# Patient Record
Sex: Male | Born: 1960 | Race: Black or African American | Hispanic: No | Marital: Married | Smoking: Former smoker
Health system: Southern US, Community
[De-identification: ages and names within clinical notes are randomized; demographics above are authoritative.]

## PROBLEM LIST (undated history)

## (undated) DIAGNOSIS — R7303 Prediabetes: Secondary | ICD-10-CM

## (undated) DIAGNOSIS — T7840XA Allergy, unspecified, initial encounter: Secondary | ICD-10-CM

## (undated) DIAGNOSIS — M1990 Unspecified osteoarthritis, unspecified site: Secondary | ICD-10-CM

## (undated) DIAGNOSIS — K219 Gastro-esophageal reflux disease without esophagitis: Secondary | ICD-10-CM

## (undated) DIAGNOSIS — J189 Pneumonia, unspecified organism: Secondary | ICD-10-CM

## (undated) DIAGNOSIS — K635 Polyp of colon: Secondary | ICD-10-CM

## (undated) DIAGNOSIS — K602 Anal fissure, unspecified: Secondary | ICD-10-CM

## (undated) HISTORY — DX: Gastro-esophageal reflux disease without esophagitis: K21.9

## (undated) HISTORY — PX: COLONOSCOPY: SHX174

## (undated) HISTORY — DX: Anal fissure, unspecified: K60.2

## (undated) HISTORY — DX: Polyp of colon: K63.5

## (undated) HISTORY — DX: Unspecified osteoarthritis, unspecified site: M19.90

---

## 2004-07-27 ENCOUNTER — Emergency Department (HOSPITAL_COMMUNITY): Admission: EM | Admit: 2004-07-27 | Discharge: 2004-07-27 | Payer: Self-pay | Admitting: Emergency Medicine

## 2004-07-28 ENCOUNTER — Emergency Department (HOSPITAL_COMMUNITY): Admission: EM | Admit: 2004-07-28 | Discharge: 2004-07-28 | Payer: Self-pay | Admitting: Family Medicine

## 2004-07-30 ENCOUNTER — Emergency Department (HOSPITAL_COMMUNITY): Admission: EM | Admit: 2004-07-30 | Discharge: 2004-07-30 | Payer: Self-pay | Admitting: Family Medicine

## 2004-08-04 ENCOUNTER — Emergency Department (HOSPITAL_COMMUNITY): Admission: EM | Admit: 2004-08-04 | Discharge: 2004-08-04 | Payer: Self-pay | Admitting: Emergency Medicine

## 2004-11-29 ENCOUNTER — Ambulatory Visit (HOSPITAL_COMMUNITY): Admission: RE | Admit: 2004-11-29 | Discharge: 2004-11-29 | Payer: Self-pay | Admitting: Gastroenterology

## 2005-05-25 ENCOUNTER — Emergency Department (HOSPITAL_COMMUNITY): Admission: EM | Admit: 2005-05-25 | Discharge: 2005-05-25 | Payer: Self-pay | Admitting: Emergency Medicine

## 2006-03-12 ENCOUNTER — Ambulatory Visit: Payer: Self-pay | Admitting: Family Medicine

## 2006-04-16 ENCOUNTER — Ambulatory Visit: Payer: Self-pay | Admitting: Family Medicine

## 2006-04-16 LAB — CONVERTED CEMR LAB
ALT: 21 units/L (ref 0–40)
Alkaline Phosphatase: 58 units/L (ref 39–117)
Anti Nuclear Antibody(ANA): NEGATIVE
BUN: 18 mg/dL (ref 6–23)
Bilirubin, Direct: 0.1 mg/dL (ref 0.0–0.3)
CO2: 25 meq/L (ref 19–32)
Cholesterol: 157 mg/dL (ref 0–200)
Creatinine, Ser: 1 mg/dL (ref 0.4–1.5)
GFR calc Af Amer: 104 mL/min
Glucose, Bld: 98 mg/dL (ref 70–99)
HDL: 59.1 mg/dL (ref >39.0)
Potassium: 3.8 meq/L (ref 3.5–5.1)
Rhuematoid fact SerPl-aCnc: <20 intl units/mL — ABNORMAL LOW (ref 0.0–20.0)
Total Bilirubin: 0.5 mg/dL (ref 0.3–1.2)
Total CHOL/HDL Ratio: 2.7
Total Protein: 6.9 g/dL (ref 6.0–8.3)
Triglycerides: 57 mg/dL (ref 0–149)

## 2006-04-30 ENCOUNTER — Ambulatory Visit: Payer: Self-pay | Admitting: Family Medicine

## 2006-05-03 ENCOUNTER — Ambulatory Visit: Payer: Self-pay | Admitting: Internal Medicine

## 2006-10-31 ENCOUNTER — Telehealth: Payer: Self-pay | Admitting: Internal Medicine

## 2006-10-31 ENCOUNTER — Ambulatory Visit: Payer: Self-pay | Admitting: Internal Medicine

## 2006-10-31 ENCOUNTER — Ambulatory Visit: Payer: Self-pay | Admitting: Cardiology

## 2006-10-31 DIAGNOSIS — E785 Hyperlipidemia, unspecified: Secondary | ICD-10-CM | POA: Insufficient documentation

## 2006-10-31 DIAGNOSIS — K219 Gastro-esophageal reflux disease without esophagitis: Secondary | ICD-10-CM | POA: Insufficient documentation

## 2006-11-01 LAB — CONVERTED CEMR LAB
BUN: 13 mg/dL (ref 6–23)
Basophils Absolute: 0 10*3/uL (ref 0.0–0.1)
Calcium: 9.2 mg/dL (ref 8.4–10.5)
GFR calc Af Amer: 93 mL/min
GFR calc non Af Amer: 77 mL/min
Hemoglobin: 14.2 g/dL (ref 13.0–17.0)
Lymphocytes Relative: 41.3 % (ref 12.0–46.0)
MCHC: 34.2 g/dL (ref 30.0–36.0)
Monocytes Absolute: 0.5 10*3/uL (ref 0.2–0.7)
Monocytes Relative: 7.9 % (ref 3.0–11.0)
Neutro Abs: 3.1 10*3/uL (ref 1.4–7.7)
Platelets: 211 10*3/uL (ref 150–400)
Potassium: 4.1 meq/L (ref 3.5–5.1)

## 2006-11-05 ENCOUNTER — Ambulatory Visit: Payer: Self-pay | Admitting: Internal Medicine

## 2006-11-05 DIAGNOSIS — L738 Other specified follicular disorders: Secondary | ICD-10-CM | POA: Insufficient documentation

## 2006-11-05 DIAGNOSIS — L678 Other hair color and hair shaft abnormalities: Secondary | ICD-10-CM | POA: Insufficient documentation

## 2008-01-10 ENCOUNTER — Emergency Department (HOSPITAL_COMMUNITY): Admission: EM | Admit: 2008-01-10 | Discharge: 2008-01-10 | Payer: Self-pay | Admitting: Emergency Medicine

## 2008-01-12 ENCOUNTER — Emergency Department (HOSPITAL_COMMUNITY): Admission: EM | Admit: 2008-01-12 | Discharge: 2008-01-12 | Payer: Self-pay | Admitting: Family Medicine

## 2008-01-15 ENCOUNTER — Encounter (HOSPITAL_COMMUNITY): Admission: RE | Admit: 2008-01-15 | Discharge: 2008-04-14 | Payer: Self-pay | Admitting: *Deleted

## 2008-01-19 ENCOUNTER — Emergency Department (HOSPITAL_COMMUNITY): Admission: EM | Admit: 2008-01-19 | Discharge: 2008-01-19 | Payer: Self-pay | Admitting: Family Medicine

## 2009-08-17 ENCOUNTER — Emergency Department (HOSPITAL_COMMUNITY): Admission: EM | Admit: 2009-08-17 | Discharge: 2009-08-17 | Payer: Self-pay | Admitting: Emergency Medicine

## 2009-11-08 ENCOUNTER — Ambulatory Visit (HOSPITAL_COMMUNITY): Admission: RE | Admit: 2009-11-08 | Discharge: 2009-11-08 | Payer: Self-pay | Admitting: Internal Medicine

## 2010-03-18 LAB — URINALYSIS, ROUTINE W REFLEX MICROSCOPIC
Nitrite: NEGATIVE
Protein, ur: NEGATIVE mg/dL
Specific Gravity, Urine: 1.028 (ref 1.005–1.030)
Urobilinogen, UA: 1 mg/dL (ref 0.0–1.0)

## 2010-08-22 ENCOUNTER — Ambulatory Visit (HOSPITAL_COMMUNITY)
Admission: RE | Admit: 2010-08-22 | Discharge: 2010-08-22 | Disposition: A | Discharge status: Home / Self Care | Payer: Managed Care, Other (non HMO) | Source: Ambulatory Visit | Attending: Internal Medicine | Admitting: Internal Medicine

## 2010-08-22 ENCOUNTER — Other Ambulatory Visit (HOSPITAL_COMMUNITY): Payer: Self-pay | Admitting: Internal Medicine

## 2010-08-22 DIAGNOSIS — M79609 Pain in unspecified limb: Secondary | ICD-10-CM

## 2010-08-22 DIAGNOSIS — M7989 Other specified soft tissue disorders: Secondary | ICD-10-CM | POA: Insufficient documentation

## 2010-12-12 ENCOUNTER — Other Ambulatory Visit: Payer: Managed Care, Other (non HMO) | Admitting: Gastroenterology

## 2011-01-13 DIAGNOSIS — K635 Polyp of colon: Secondary | ICD-10-CM

## 2011-01-13 HISTORY — DX: Polyp of colon: K63.5

## 2011-01-18 ENCOUNTER — Encounter (INDEPENDENT_AMBULATORY_CARE_PROVIDER_SITE_OTHER): Payer: Self-pay | Admitting: General Surgery

## 2011-01-23 ENCOUNTER — Encounter (INDEPENDENT_AMBULATORY_CARE_PROVIDER_SITE_OTHER): Payer: Self-pay | Admitting: General Surgery

## 2011-01-23 ENCOUNTER — Ambulatory Visit (INDEPENDENT_AMBULATORY_CARE_PROVIDER_SITE_OTHER): Payer: Managed Care, Other (non HMO) | Admitting: General Surgery

## 2011-01-23 VITALS — BP 124/88 | HR 84 | Temp 97.0°F | Resp 12 | Ht 66.0 in | Wt 168.8 lb

## 2011-01-23 DIAGNOSIS — K602 Anal fissure, unspecified: Secondary | ICD-10-CM

## 2011-01-23 NOTE — Progress Notes (Signed)
Patient ID: Brett [name], male   DOB: [birth year], 51 y.o.   MRN: [id number]  Chief Complaint  Patient presents with  . Other    new pt- eval anal fissure    HPI Brett [name] is a 51 y.o. male.  He is referred by Dr. Charlott Rakes for evaluation and management of an anal fissure which is now becoming chronic.  The patient states that he has a 6 month history of rectal pain but no bleeding. He feels burning in the rectal area. This occurred during and more intensely after bowel movements. This happens every 2 or 3 days. He doesn't feel any mass on the outside or prolapse. He has had 2 two-week courses of diltiazem cream without relief.  A colonoscopy  in January of this year which shows a few small polyps and maybe some tiny hemorrhoids but nothing else.  He thinks that  the problem started when he started taking hydrocodone 6 months ago because of arthritis in his hands. He stopped taking that and now takes daily Colace and his bowels are a little bit easier. HPI  Past Medical History  Diagnosis Date  . Arthritis   . GERD (gastroesophageal reflux disease)   . Diabetes mellitus   . Anal fissure     Past Surgical History  Procedure Date  . Colonoscopy     History reviewed. No pertinent family history.  Social History History  Substance Use Topics  . Smoking status: Current Everyday Smoker -- 0.2 packs/day  . Smokeless tobacco: Not on file  . Alcohol Use: Yes    Allergies  Allergen Reactions  . Dicyclomine Hives    Current Outpatient Prescriptions  Medication Sig Dispense Refill  . acetaminophen (TYLENOL) 500 MG tablet Take 500 mg by mouth every 6 (six) hours as needed.        Review of Systems Review of Systems  Constitutional: Negative for fever, chills and unexpected weight change.  HENT: Negative for hearing loss, congestion, sore throat, trouble swallowing and voice change.   Eyes: Negative for visual disturbance.  Respiratory: Positive for stridor.  Negative for cough and wheezing.   Cardiovascular: Negative for chest pain, palpitations and leg swelling.  Gastrointestinal: Positive for constipation and rectal pain. Negative for nausea, vomiting, abdominal pain, diarrhea, blood in stool, abdominal distention and anal bleeding.  Genitourinary: Negative for hematuria and difficulty urinating.  Musculoskeletal: Negative for arthralgias.  Skin: Negative for rash and wound.  Neurological: Negative for seizures, syncope, weakness and headaches.  Hematological: Negative for adenopathy. Does not bruise/bleed easily.  Psychiatric/Behavioral: Negative for confusion.    Blood pressure 124/88, pulse 84, temperature 97 F (36.1 C), temperature source Temporal, resp. rate 12, height 5\' 6"  (1.676 m), weight 168 lb 12.8 oz (76.567 kg).  Physical Exam Physical Exam  Constitutional: He is oriented to person, place, and time. He appears well-developed and well-nourished. No distress.  HENT:  Head: Normocephalic.  Nose: Nose normal.  Mouth/Throat: No oropharyngeal exudate.  Eyes: Conjunctivae and EOM are normal. Pupils are equal, round, and reactive to light. Right eye exhibits no discharge. Left eye exhibits no discharge. No scleral icterus.  Neck: Normal range of motion. Neck supple. No JVD present. No tracheal deviation present. No thyromegaly present.  Cardiovascular: Normal rate, regular rhythm, normal heart sounds and intact distal pulses.   No murmur heard. Pulmonary/Chest: Effort normal and breath sounds normal. No stridor. No respiratory distress. He has no wheezes. He has no rales. He exhibits no tenderness.  Abdominal: Soft. Bowel  sounds are normal. He exhibits no distension and no mass. There is no tenderness. There is no rebound and no guarding.  Genitourinary:       Anal fissure visuallized in posterior midline. Small sentinal pile .  No other external abnormalities. Too tender for anoscopy.  Musculoskeletal: Normal range of motion. He  exhibits no edema and no tenderness.  Lymphadenopathy:    He has no cervical adenopathy.  Neurological: He is alert and oriented to person, place, and time. He has normal reflexes. Coordination normal.  Skin: Skin is warm and dry. No rash noted. He is not diaphoretic. No erythema. No pallor.  Psychiatric: He has a normal mood and affect. His behavior is normal. Judgment and thought content normal.    Data Reviewed I reviewed all of Dr. Marge Duncans office notes. Colonoscopy report is pending.  Assessment    Chronic anal fissure, posterior midline. Due to the failure of medical therapy I think he would benefit from surgical intervention.  Osteoarthritis  Gastroesophageal reflux disease, controlled    Plan    We'll schedule for examination under anesthesia, lateral internal sphincterotomy and repair of anal fissure.  I have discussed indications and details of the surgery with the patient and his wife. Risks and complications have been outlined, including but limited to bleeding, infection, recurrence of the fissure, sphincter damage with temporary or permanent incontinence, cardiac pulmonary and thromboembolic problems. We discussed temporary disability issues. He understands these issues. His questions were answered. He agrees with this plan.   Angelia Mould. Derrell Lolling, M.D., [facility] General and Minimally invasive Surgery Breast and Colorectal Surgery Office:   [phone] Pager:   [phone]     01/23/2011, 10:21 AM

## 2011-01-23 NOTE — Patient Instructions (Signed)
You have an anal fissure in the posterior midline. You will be scheduled for surgery to repair the anal fissure and perform an internal sphincterotomy.  Anal Fissure, Adult An anal fissure is a small tear or crack in the skin around the anus. Bleeding from a fissure usually stops on its own within a few minutes. However, bleeding will often reoccur with each bowel movement until the crack heals.  CAUSES   Passing large, hard stools.   Frequent diarrheal stools.   Constipation.   Inflammatory bowel disease (Crohn's disease or ulcerative colitis).   Infections.   Anal sex.  SYMPTOMS   Small amounts of blood seen on your stools, on toilet paper, or in the toilet after a bowel movement.   Rectal bleeding.   Painful bowel movements.   Itching or irritation around the anus.  DIAGNOSIS Your caregiver will examine the anal area. An anal fissure can usually be seen with careful inspection. A rectal exam may be performed and a short tube (anoscope) may be used to examine the anal canal. TREATMENT   You may be instructed to take fiber supplements. These supplements can soften your stool to help make bowel movements easier.   Sitz baths may be recommended to help heal the tear. Do not use soap in the sitz baths.   A medicated cream or ointment may be prescribed to lessen discomfort.  HOME CARE INSTRUCTIONS   Maintain a diet high in fruits, whole grains, and vegetables. Avoid constipating foods like bananas and dairy products.   Take sitz baths as directed by your caregiver.   Drink enough fluids to keep your urine clear or pale yellow.   Only take over-the-counter or prescription medicines for pain, discomfort, or fever as directed by your caregiver. Do not take aspirin as this may increase bleeding.   Do not use ointments containing numbing medications (anesthetics) or hydrocortisone. They could slow healing.  SEEK MEDICAL CARE IF:   Your fissure is not completely healed within  3 days.   You have further bleeding.   You have a fever.   You have diarrhea mixed with blood.   You have pain.   Your problem is getting worse rather than better.  MAKE SURE YOU:   Understand these instructions.   Will watch your condition.   Will get help right away if you are not doing well or get worse.  Document Released: 12/19/2004 Document Revised: 08/31/2010 Document Reviewed: 06/05/2010 [facility] Patient Information 2012 [place], [place].

## 2011-01-24 ENCOUNTER — Encounter (INDEPENDENT_AMBULATORY_CARE_PROVIDER_SITE_OTHER): Payer: Self-pay

## 2011-02-24 DIAGNOSIS — K602 Anal fissure, unspecified: Secondary | ICD-10-CM

## 2011-02-24 HISTORY — PX: ANAL FISSURE REPAIR: SHX2312

## 2011-02-27 ENCOUNTER — Telehealth (INDEPENDENT_AMBULATORY_CARE_PROVIDER_SITE_OTHER): Payer: Self-pay

## 2011-02-27 NOTE — Telephone Encounter (Signed)
Pt lmom to call with po appt. I was unable to reach pt at # given. I reached pts wife and advised her of po appt.

## 2011-03-14 ENCOUNTER — Other Ambulatory Visit (INDEPENDENT_AMBULATORY_CARE_PROVIDER_SITE_OTHER): Payer: Self-pay | Admitting: General Surgery

## 2011-03-14 NOTE — Telephone Encounter (Signed)
Pt called JX:BJYN control other than tylenol. Pt has returned to work and still has soreness. Pt advised to take advil,continue wet wipes,stool softeners and warm soaks. 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 Pt to call if pain becomes worse or other symptoms occur.

## 2011-03-23 ENCOUNTER — Encounter (INDEPENDENT_AMBULATORY_CARE_PROVIDER_SITE_OTHER): Payer: Managed Care, Other (non HMO) | Admitting: General Surgery

## 2011-03-27 ENCOUNTER — Ambulatory Visit (INDEPENDENT_AMBULATORY_CARE_PROVIDER_SITE_OTHER): Payer: Managed Care, Other (non HMO) | Admitting: General Surgery

## 2011-03-27 ENCOUNTER — Encounter (INDEPENDENT_AMBULATORY_CARE_PROVIDER_SITE_OTHER): Payer: Self-pay | Admitting: General Surgery

## 2011-03-27 VITALS — BP 118/82 | HR 70 | Temp 96.8°F | Resp 18 | Ht 65.75 in | Wt 164.8 lb

## 2011-03-27 DIAGNOSIS — K602 Anal fissure, unspecified: Secondary | ICD-10-CM

## 2011-03-27 NOTE — Patient Instructions (Signed)
Your chronic anal fissure has almost completely healed. It will completely heale within the next 2-4 weeks.  You are advised to drink lots of fluids, follow a high fiber diet, and take supplemental fiber as necessary.  Return to see Dr. Derrell Lolling if new problems arise.

## 2011-03-27 NOTE — Progress Notes (Signed)
Subjective:     Patient ID: Brett [name], male   DOB: [birth year], 51 y.o.   MRN: [id number]  HPI He underwent sphincterotomy and repair of chronic anal fissure on February 24, 2011. He has done very well. He states that he has almost no pain. He has no pain when he has a bowel movement. Occasionally feels a little bit of discomfort when he standing and working. He is on stool softeners. He is having regular bowel movements. He feels greatly improved. He has seen no blood.  Review of Systems     Objective:   Physical Exam Patient looks well. He is in no distress.  Rectal exam reveals normal external anoderm. There is a tiny open place in the posterior midline which is healing. No signs of infection. Nontender. Normal healing appearance.    Assessment:     Chronic anal fissure, posterior midline, excellent resolution of symptoms 5 weeks postop sphincterotomy and repair.    Plan:     Hydration, high-fiber diet, supplemental fiber discussed and encouraged.  Return to see me p.r.n.   Angelia Mould. Derrell Lolling, M.D., [facility] General and Minimally invasive Surgery Breast and Colorectal Surgery Office:   [phone] Pager:   [phone]

## 2011-06-02 ENCOUNTER — Emergency Department (HOSPITAL_COMMUNITY)
Admission: EM | Admit: 2011-06-02 | Discharge: 2011-06-02 | Disposition: A | Discharge status: Home / Self Care | Payer: Managed Care, Other (non HMO) | Attending: Emergency Medicine | Admitting: Emergency Medicine

## 2011-06-02 ENCOUNTER — Encounter (HOSPITAL_COMMUNITY): Payer: Self-pay | Admitting: Emergency Medicine

## 2011-06-02 ENCOUNTER — Emergency Department (HOSPITAL_COMMUNITY): Payer: Managed Care, Other (non HMO)

## 2011-06-02 DIAGNOSIS — K219 Gastro-esophageal reflux disease without esophagitis: Secondary | ICD-10-CM | POA: Insufficient documentation

## 2011-06-02 DIAGNOSIS — F17200 Nicotine dependence, unspecified, uncomplicated: Secondary | ICD-10-CM | POA: Insufficient documentation

## 2011-06-02 DIAGNOSIS — Z79899 Other long term (current) drug therapy: Secondary | ICD-10-CM | POA: Insufficient documentation

## 2011-06-02 DIAGNOSIS — E119 Type 2 diabetes mellitus without complications: Secondary | ICD-10-CM | POA: Insufficient documentation

## 2011-06-02 DIAGNOSIS — J40 Bronchitis, not specified as acute or chronic: Secondary | ICD-10-CM | POA: Insufficient documentation

## 2011-06-02 DIAGNOSIS — R0602 Shortness of breath: Secondary | ICD-10-CM | POA: Insufficient documentation

## 2011-06-02 MED ORDER — HYDROCOD POLST-CHLORPHEN POLST 10-8 MG/5ML PO LQCR: 5.0000 mL | Freq: Two times a day (BID) | ORAL | Status: DC | PRN

## 2011-06-02 MED ORDER — IPRATROPIUM BROMIDE 0.02 % IN SOLN
0.5000 mg | Freq: Once | RESPIRATORY_TRACT | Status: AC
  Administered 2011-06-02: 0.5 mg via RESPIRATORY_TRACT
  Filled 2011-06-02: qty 2.5

## 2011-06-02 MED ORDER — HYDROCOD POLST-CHLORPHEN POLST 10-8 MG/5ML PO LQCR
5.0000 mL | Freq: Once | ORAL | Status: AC
  Administered 2011-06-02: 5 mL via ORAL
  Filled 2011-06-02: qty 5

## 2011-06-02 MED ORDER — ALBUTEROL SULFATE (5 MG/ML) 0.5% IN NEBU
5.0000 mg | INHALATION_SOLUTION | Freq: Once | RESPIRATORY_TRACT | Status: AC
  Administered 2011-06-02: 5 mg via RESPIRATORY_TRACT
  Filled 2011-06-02: qty 1

## 2011-06-02 NOTE — Discharge Instructions (Signed)
Bronchitis Bronchitis is a problem of the air tubes leading to your lungs. This problem makes it hard for air to get in and out of the lungs. You may cough a lot because your air tubes are narrow. Going without care can cause lasting (chronic) bronchitis. HOME CARE   Drink enough fluids to keep your pee (urine) clear or pale yellow.   Use a cool mist humidifier.   Quit smoking if you smoke. If you keep smoking, the bronchitis might not get better.   Only take medicine as told by your doctor.  GET HELP RIGHT AWAY IF:   Coughing keeps you awake.   You start to wheeze.   You become more sick or weak.   You have a hard time breathing or get short of breath.   You cough up blood.   Coughing lasts more than 2 weeks.   You have a fever.   Your baby is older than 3 months with a rectal temperature of 102 F (38.9 C) or higher.   Your baby is 43 months old or younger with a rectal temperature of 100.4 F (38 C) or higher.  MAKE SURE YOU:  Understand these instructions.   Will watch your condition.   Will get help right away if you are not doing well or get worse.  Document Released: 06/07/2007 Document Revised: 12/08/2010 Document Reviewed: 11/20/2008 [facility] Patient Information 2012 [place], [place]. I would like you use your albuterol inhaler every 4-6 hours around-the-clock for the next 2 days on a regular basis, and then as, needed.  I've also given, you a prescription for the Tussionex, which seemed to give you the most relief.  In the emergency Department, you do not have a pneumonia on your chest x-ray

## 2011-06-02 NOTE — ED Notes (Signed)
Patient is alert and oriented x3.  He was given DC instructions and follow up visit instructions.  Patient gave verbal understanding.  He was DC ambulatory under his own power to home.  V/S stable.  He was not showing any signs of distress on DC 

## 2011-06-02 NOTE — ED Notes (Signed)
Patient complains of cough with yellow sputum, shortness of breath and nasal congestion that started 2 weeks ago

## 2011-06-02 NOTE — ED Notes (Addendum)
Pt states he's been having a cough for 3 weeks. Placed on Albuterol inhaler for 2 days. Last taken at 1900 today. Coughing and SOB when coughing still occurs. He had a Z-pack and states it helped. Hx of smoking since 51 years old.

## 2011-06-02 NOTE — ED Provider Notes (Signed)
History     CSN: [id number]  Arrival date & time 06/02/11  0025   First MD Initiated Contact with Patient 06/02/11 0239      Chief Complaint  Patient presents with  . Cough  . Shortness of Breath  . Nasal Congestion    (Consider location/radiation/quality/duration/timing/severity/associated sxs/prior treatment) HPI Comments: Patient states he started with URI symptoms about 3 weeks ago and it persistently had a cough since then.  He was seen by his primary care physician, who gave him an inhaler a couple days ago, which she's been using every 4-6 hours with minimal relief of his coughing.  He denies fever, production of his cough.  He does endorse a postnasal drip  Patient is a 51 y.o. male presenting with cough and shortness of breath. The history is provided by the patient.  Cough This is a new problem. The current episode started more than 1 week ago. The problem occurs every few minutes. The problem has not changed since onset.The cough is non-productive. Associated symptoms include shortness of breath. Pertinent negatives include no rhinorrhea and no wheezing.  Shortness of Breath  Associated symptoms include cough and shortness of breath. Pertinent negatives include no rhinorrhea and no wheezing.    Past Medical History  Diagnosis Date  . Arthritis   . GERD (gastroesophageal reflux disease)   . Diabetes mellitus   . Anal fissure     Past Surgical History  Procedure Date  . Colonoscopy   . Anal fissure repair 02/24/11    History reviewed. No pertinent family history.  History  Substance Use Topics  . Smoking status: Current Everyday Smoker -- 0.2 packs/day    Types: Cigarettes  . Smokeless tobacco: Never Used  . Alcohol Use: Yes     socially      Review of Systems  HENT: Positive for postnasal drip. Negative for rhinorrhea.   Respiratory: Positive for cough, chest tightness and shortness of breath. Negative for wheezing.   Neurological: Negative for  dizziness.    Allergies  Dicyclomine  Home Medications   Current Outpatient Rx  Name Route Sig Dispense Refill  . ALBUTEROL SULFATE HFA 108 (90 BASE) MCG/ACT IN AERS Inhalation Inhale 2 puffs into the lungs every 6 (six) hours as needed.    Marland Kitchen VITAMIN D 1000 UNITS PO TABS Oral Take 1,000 Units by mouth daily.    Marland Kitchen DOCUSATE SODIUM 100 MG PO CAPS Oral Take 100 mg by mouth 2 (two) times daily as needed. Constipation    . IBUPROFEN 200 MG PO TABS Oral Take 200 mg by mouth as needed.    Marland Kitchen RANITIDINE HCL 150 MG PO TABS Oral Take 150 mg by mouth daily.    Marland Kitchen HYDROCOD POLST-CPM POLST ER 10-8 MG/5ML PO LQCR Oral Take 5 mLs by mouth every 12 (twelve) hours as needed. 140 mL 0    BP 103/70  Pulse 70  Temp(Src) 98 F (36.7 C) (Oral)  Resp 18  SpO2 96%  Physical Exam  Constitutional: He appears well-developed and well-nourished.  HENT:  Head: Normocephalic.  Right Ear: External ear normal.  Left Ear: External ear normal.  Mouth/Throat: Oropharynx is clear and moist.  Eyes: Pupils are equal, round, and reactive to light.  Neck: Normal range of motion.  Cardiovascular: Normal rate.   Pulmonary/Chest: Effort normal. No respiratory distress. He has no wheezes. He exhibits no tenderness.  Musculoskeletal: Normal range of motion.  Skin: Skin is warm.    ED Course  Procedures (including critical  care time)  Labs Reviewed - No data to display Dg Chest 2 View  06/02/2011  *RADIOLOGY REPORT*  Clinical Data: Cough and cold symptoms for 3 weeks.  CHEST - 2 VIEW  Comparison: 11/08/2009  Findings: The heart size and pulmonary vascularity are normal. The lungs appear clear and expanded without focal air space disease or consolidation. No blunting of the costophrenic angles.  No pneumothorax.  No significant change since previous study.  IMPRESSION: No evidence of active pulmonary disease.  Original Report Authenticated By: Marlon Pel, M.D.     1. Bronchitis       MDM   Most likely  bronchitis.  Chest x-ray is clear.  IV, administered albuterol, Atrovent, with some relief.  He gets additional relief of his cough.  Symptoms with Tussionex.  I will discharge patient home with instructions to use his albuterol inhaler 2 puffs every 4-6 hours.  A regular basis for the next 2 days, as well as the Tussionex        Arman Filter, NP 06/02/11 [phone]

## 2011-06-03 NOTE — ED Provider Notes (Addendum)
Medical screening examination/treatment/procedure(s) were performed by non-physician practitioner and as supervising physician I was immediately available for consultation/collaboration.   Lyanne Co, MD 06/03/11 1610  Lyanne Co, MD 06/03/11 [phone]

## 2011-06-15 ENCOUNTER — Ambulatory Visit (INDEPENDENT_AMBULATORY_CARE_PROVIDER_SITE_OTHER): Payer: Managed Care, Other (non HMO) | Admitting: Internal Medicine

## 2011-06-15 ENCOUNTER — Encounter: Payer: Self-pay | Admitting: Internal Medicine

## 2011-06-15 VITALS — BP 118/90 | HR 72 | Ht 65.0 in | Wt 157.2 lb

## 2011-06-15 DIAGNOSIS — R05 Cough: Secondary | ICD-10-CM

## 2011-06-15 DIAGNOSIS — R059 Cough, unspecified: Secondary | ICD-10-CM

## 2011-06-15 MED ORDER — LEVALBUTEROL HCL 0.63 MG/3ML IN NEBU
0.6300 mg | INHALATION_SOLUTION | Freq: Once | RESPIRATORY_TRACT | Status: AC
  Administered 2011-06-15: 0.63 mg via RESPIRATORY_TRACT

## 2011-06-15 MED ORDER — METHYLPREDNISOLONE ACETATE 80 MG/ML IJ SUSP
80.0000 mg | Freq: Once | INTRAMUSCULAR | Status: AC
  Administered 2011-06-15: 80 mg via INTRAMUSCULAR

## 2011-06-15 NOTE — Progress Notes (Signed)
06/15/11- 50 yoM former smoker referred courtesy of Dr Oneta Rack for pulmonary evaluation because of persistent cough with chest tightness. Wife is here. He complains of persistent cough over the past 3 weeks. It is not recognize that particular trigger or relief. This is a new problem for him he began with an interval of sneezing and scratchy throat. He does have a history of allergic rhinitis mostly in the spring. With the current episode phlegm was initially green. He took a Z-Pak which cleared color. He went to the emergency room and was given Tussionex about June 3. Still blowing some green from his nose and complains of head congestion. Nebulizer treatment at the emergency room helped him inhaler used at home increase his cough. He notices significant wheezing. The emergency room called this asthma. Denies blood, adenopathy, myalgias or rash. He is now on day 5/10 doxycycline. History of GERD treated with ranitidine. Borderline diabetic. 05/05/11 CXR- IMPRESSION:  No evidence of active pulmonary disease.  Original Report Authenticated By: Marlon Pel, M.D.   Prior to Admission medications   Medication Sig Start Date End Date Taking? Authorizing Provider  albuterol (PROVENTIL HFA;VENTOLIN HFA) 108 (90 BASE) MCG/ACT inhaler Inhale 2 puffs into the lungs every 6 (six) hours as needed.   Yes Historical Provider, MD  chlorpheniramine-HYDROcodone (TUSSIONEX) 10-8 MG/5ML LQCR Take 5 mLs by mouth every 12 (twelve) hours as needed. 06/02/11  Yes Arman Filter, NP  cholecalciferol (VITAMIN D) 1000 UNITS tablet Take 1,000 Units by mouth daily.   Yes Historical Provider, MD  dextromethorphan-guaiFENesin (MUCINEX DM) 30-600 MG per 12 hr tablet Take 1 tablet by mouth as needed.   Yes Historical Provider, MD  docusate sodium (COLACE) 100 MG capsule Take 100 mg by mouth 2 (two) times daily as needed. Constipation   Yes Historical Provider, MD  ibuprofen (ADVIL) 200 MG tablet Take 200 mg by mouth as needed.    Yes Historical Provider, MD  ranitidine (ZANTAC) 150 MG tablet Take 150 mg by mouth daily.   Yes Historical Provider, MD   Past Medical History  Diagnosis Date  . Arthritis   . GERD (gastroesophageal reflux disease)   . Diabetes mellitus   . Anal fissure   . Colon polyps 01/13/11    Colonoscopy   Past Surgical History  Procedure Date  . Colonoscopy   . Anal fissure repair 02/24/11   Family History  Problem Relation Age of Onset  . Allergies Daughter   . Allergies Sister   . Asthma Daughter   . Rheum arthritis Mother    History   Social History  . Marital Status: Married    Spouse Name: N/A    Number of Children: 2  . Years of Education: N/A   Occupational History  .  [employer]   Social History Main Topics  . Smoking status: Former Smoker -- 0.2 packs/day for 27 years    Types: Cigarettes    Quit date: 06/01/2011  . Smokeless tobacco: Never Used   Comment: 7 ciggs a day  . Alcohol Use: Yes     socially  . Drug Use: No  . Sexually Active: Not on file   Other Topics Concern  . Not on file   Social History Narrative  . No narrative on file   ROS-see HPI Constitutional:   No-   weight loss, night sweats, fevers, chills, fatigue, lassitude. HEENT:   No-  headaches, difficulty swallowing, tooth/dental problems, sore throat,       No-  sneezing,  itching, ear ache, nasal congestion, post nasal drip,  CV:  No-   chest pain, orthopnea, PND, swelling in lower extremities, anasarca, dizziness, palpitations Resp: No-   shortness of breath with exertion or at rest.              +  productive cough,  + non-productive cough,  No- coughing up of blood.              No-   change in color of mucus.  No- wheezing.   Skin: No-   rash or lesions. GI:  +heartburn, indigestion, No-abdominal pain, nausea, vomiting, diarrhea,                 change in bowel habits, loss of appetite GU: No-   dysuria, change in color of urine, no urgency or frequency.  No- flank pain. MS:  No-   joint  pain or swelling.  No- decreased range of motion.  No- back pain. Neuro-     nothing unusual Psych:  No- change in mood or affect. No depression or anxiety.  No memory loss.  OBJ- Physical Exam General- Alert, Oriented, Affect-appropriate, Distress- none acute Skin- rash-none, lesions- none, excoriation- none Lymphadenopathy- none Head- atraumatic            Eyes- Gross vision intact, PERRLA, conjunctivae and secretions clear            Ears- Hearing, canals-normal            Nose- Clear, no-Septal dev, mucus, polyps, erosion, perforation             Throat- Mallampati II , mucosa clear , drainage- none, tonsils- atrophic Neck- flexible , trachea midline, no stridor , thyroid nl, carotid no bruit Chest - symmetrical excursion , unlabored           Heart/CV- RRR , no murmur , no gallop  , no rub, nl s1 s2                           - JVD- none , edema- none, stasis changes- none, varices- none           Lung- clear to P&A, wheeze- none, cough- none , dullness-none, rub- none           Chest wall-  Abd- tender-no, distended-no, bowel sounds-present, HSM- no Br/ Gen/ Rectal- Not done, not indicated Extrem- cyanosis- none, clubbing, none, atrophy- none, strength- nl Neuro- grossly intact to observation

## 2011-06-15 NOTE — Patient Instructions (Addendum)
Please call us with the name of the blue medicine you have been taking. When I know what that is, I can decide if adding an antibiotic would help.  Depo 80  Neb xop 0.63

## 2011-06-16 ENCOUNTER — Telehealth: Payer: Self-pay | Admitting: Internal Medicine

## 2011-06-16 NOTE — Telephone Encounter (Signed)
Per CY-He don't have bu so many tools; the steroid combination is one he uses often.

## 2011-06-16 NOTE — Telephone Encounter (Signed)
Called refills and rx to pharmacy-pts wife is aware.

## 2011-06-16 NOTE — Telephone Encounter (Signed)
I spoke with spouse and she is wanting to know why Dr. Maple Hudson is giving him a pred taper when he had a depo injection. She thinks this is to much prednisone for him. Please advise Dr. Maple Hudson thanks

## 2011-06-16 NOTE — Telephone Encounter (Signed)
I spoke with spouse and she states pt is worse today than yesterday--has a severe dry, hacking cough, very SOB, wheezing, and chest is very tight. Pt current ABX is doxycyline 100 mg BID. Pt has finished the tussionex cough syrup. Spouse requesting recs. Please advise Dr. Maple Hudson thanks  Allergies  Allergen Reactions  . Dicyclomine Hives

## 2011-06-16 NOTE — Telephone Encounter (Signed)
Per CY-okay to give Prednisone 10 mg #20 take 4 x 2 days, 3 x 2 days, 2 x 2 days, 1 x 2 days then stop, no refills. And Okay to refill Tussionex #235ml 1 tsp every 6 hours prn cough no refills.

## 2011-06-21 ENCOUNTER — Ambulatory Visit (INDEPENDENT_AMBULATORY_CARE_PROVIDER_SITE_OTHER): Payer: Managed Care, Other (non HMO) | Admitting: Adult Health

## 2011-06-21 ENCOUNTER — Telehealth: Payer: Self-pay | Admitting: Internal Medicine

## 2011-06-21 ENCOUNTER — Encounter: Payer: Self-pay | Admitting: Adult Health

## 2011-06-21 VITALS — BP 118/72 | HR 66 | Temp 97.7°F | Ht 66.0 in | Wt 161.2 lb

## 2011-06-21 DIAGNOSIS — R059 Cough, unspecified: Secondary | ICD-10-CM

## 2011-06-21 DIAGNOSIS — R05 Cough: Secondary | ICD-10-CM

## 2011-06-21 NOTE — Progress Notes (Signed)
  Subjective:    Patient ID: Brett [name], male    DOB: 1960/07/10, 51 y.o.   MRN: [id number]  HPI  51 yoM former smoker referred courtesy of Dr Oneta Rack for pulmonary evaluation because of  Cough , initial consult 06/15/11   06/21/2011 Acute OV  Returns for persistent cough. Seen last week for initial pulmonary consult for chronic cough.  CXR 05/05/11 w/ NAD.  Complains that he continues to have cough and wheezing on/off.  Last ov , given depo medrol shot.  Tx w/ hydromet. He was called in steroid taper few days ago.  Cough is better but not totally gone.  No hemoptysis or chest pain . No calf pain .  ++heartburn, not taking GERD meds.     Review of Systems Constitutional:   No  weight loss, night sweats,  Fevers, chills, fatigue, or  lassitude.  HEENT:   No headaches,  Difficulty swallowing,  Tooth/dental problems, or  Sore throat,                No sneezing, itching, ear ache,  +nasal congestion, post nasal drip,   CV:  No chest pain,  Orthopnea, PND, swelling in lower extremities, anasarca, dizziness, palpitations, syncope.   GI   No  abdominal pain, nausea, vomiting, diarrhea, change in bowel habits, loss of appetite, bloody stools.   Resp:  No coughing up of blood.  Marland Kitchen  No chest wall deformity  Skin: no rash or lesions.  GU: no dysuria, change in color of urine, no urgency or frequency.  No flank pain, no hematuria   MS:  No joint pain or swelling.  No decreased range of motion.  No back pain.  Psych:  No change in mood or affect. No depression or anxiety.  No memory loss.    '     Objective:   Physical Exam GEN: A/Ox3; pleasant , NAD, well nourished   HEENT:  [place]/AT,  EACs-clear, TMs-wnl, NOSE-clear, THROAT-clear, no lesions, no postnasal drip or exudate noted.   NECK:  Supple w/ fair ROM; no JVD; normal carotid impulses w/o bruits; no thyromegaly or nodules palpated; no lymphadenopathy.  RESP  Clear  P & A; w/o, wheezes/ rales/ or rhonchi.no accessory muscle  use, no dullness to percussion  CARD:  RRR, no m/r/g  , no peripheral edema, pulses intact, no cyanosis or clubbing.  GI:   Soft & nt; nml bowel sounds; no organomegaly or masses detected.  Musco: Warm bil, no deformities or joint swelling noted.   Neuro: alert, no focal deficits noted.    Skin: Warm, no lesions or rashes         Assessment & Plan:

## 2011-06-21 NOTE — Telephone Encounter (Signed)
Called, spoke with pt's wife who states on Friday when pred taper and cough syrup where called in they were advised by the nurse that pt would need to come in today to see Dr. Maple Hudson at 11:15 am.  States she called this am to check on this appt but was advised it is not scheduled.  Only scheduled appt for pt is in July.  States pt is still having some wheezing, DOE when outside, green mucus from nose, and coughing some.  Cough has improved and is prod with a small amount of white mucus.  Denies f/c/s.  I apologized to wife for this confusion and scheduled pt to see TP today at 11:30 am (ok per KW as Dr. Maple Hudson is not in the office today).  Wife aware and will inform pt.

## 2011-06-21 NOTE — Patient Instructions (Addendum)
Finish Prednisone taper as directed  Delsym 2 tsp Twice daily  Until cough is gone.  GERD diet  Zyrtec 10mg  daily  Chlortrimeton 4mg  2 At bedtime   Dexilant 60mg  daily until samples are gone then Prilosec 20mg  daily  follow up Dr. Maple Hudson  In 3-4 weeks and As needed   Please contact office for sooner follow up if symptoms do not improve or worsen or seek emergency care

## 2011-06-22 NOTE — Assessment & Plan Note (Signed)
Upper airway cough syndrome suspect multifactoral in nature  >maximize preventive therapy w/ tx aimed at GERD/AR prevention along w/ steroid and cough control   Plan:  Finish Prednisone taper as directed  Delsym 2 tsp Twice daily  Until cough is gone.  GERD diet  Zyrtec 10mg  daily  Chlortrimeton 4mg  2 At bedtime   Dexilant 60mg  daily until samples are gone then Prilosec 20mg  daily  follow up Dr. Maple Hudson  In 3-4 weeks and As needed   Please contact office for sooner follow up if symptoms do not improve or worsen or seek emergency care

## 2011-06-23 ENCOUNTER — Encounter: Payer: Self-pay | Admitting: Internal Medicine

## 2011-06-23 NOTE — Assessment & Plan Note (Signed)
This illness began like an infection associated with discolored because from nose and chest. Now with wheezing, it is the asthmatic bronchitis. There may be residual sinusitis. He may be starting a pattern of cyclical cough if the cough is increasing reflux and triggering nerve reflux related coughing.

## 2011-06-28 ENCOUNTER — Other Ambulatory Visit: Payer: Self-pay | Admitting: Internal Medicine

## 2011-06-28 ENCOUNTER — Institutional Professional Consult (permissible substitution): Payer: Managed Care, Other (non HMO) | Admitting: Pulmonary Disease

## 2011-06-28 NOTE — Telephone Encounter (Signed)
Per CY okay to refill.  

## 2011-06-28 NOTE — Telephone Encounter (Signed)
Received refill request for pred taper. Please advise Dr. Maple Hudson if okay to refill, thanks

## 2011-07-26 ENCOUNTER — Ambulatory Visit: Payer: Managed Care, Other (non HMO) | Admitting: Internal Medicine

## 2011-09-26 ENCOUNTER — Other Ambulatory Visit: Payer: Self-pay | Admitting: Internal Medicine

## 2011-09-27 ENCOUNTER — Telehealth: Payer: Self-pay | Admitting: Internal Medicine

## 2011-09-27 NOTE — Telephone Encounter (Signed)
Pt will come in on Tuesday(at their request) to see TP and then will follow up with CY.

## 2011-09-28 NOTE — Telephone Encounter (Signed)
Ok to refill 

## 2011-09-28 NOTE — Telephone Encounter (Signed)
Please advise if okay to refill. Thanks.  

## 2011-10-03 ENCOUNTER — Ambulatory Visit: Payer: Managed Care, Other (non HMO) | Admitting: Adult Health

## 2011-10-31 ENCOUNTER — Telehealth: Payer: Self-pay | Admitting: Internal Medicine

## 2011-10-31 NOTE — Telephone Encounter (Signed)
I spoke with spouse and she scheduled pt to see TP 11/02/11 at 3:00 per spouse request this date/time. She is aware of address. Nothing further was needed

## 2011-10-31 NOTE — Telephone Encounter (Signed)
I spoke with spouse and she stated pt has had a terrible cough x 1 month now. Sometimes her gets up green phlem. He has been taking robitussin and using his inhalers but nothing is working. She is requesting to have pt worked in to be seen.  Pt last OV 06/15/11 w/ CDY 06/21/11 w/ tp NS APPT 07/26/11 Cancelled appt 10/03/11.   Please advise Dr. Maple Hudson thanks

## 2011-10-31 NOTE — Telephone Encounter (Signed)
Per CY-patient can be put on TP's schedule for acute visit but will need to make and keep OV with CY for follow up.

## 2011-11-02 ENCOUNTER — Ambulatory Visit: Payer: Managed Care, Other (non HMO) | Admitting: Adult Health

## 2011-11-06 ENCOUNTER — Ambulatory Visit (INDEPENDENT_AMBULATORY_CARE_PROVIDER_SITE_OTHER): Payer: Managed Care, Other (non HMO) | Admitting: Adult Health

## 2011-11-06 ENCOUNTER — Encounter: Payer: Self-pay | Admitting: Adult Health

## 2011-11-06 VITALS — BP 128/84 | HR 77 | Temp 97.2°F | Ht 66.0 in | Wt 161.4 lb

## 2011-11-06 DIAGNOSIS — R05 Cough: Secondary | ICD-10-CM

## 2011-11-06 DIAGNOSIS — R059 Cough, unspecified: Secondary | ICD-10-CM

## 2011-11-06 MED ORDER — HYDROCODONE-HOMATROPINE 5-1.5 MG/5ML PO SYRP: 5.0000 mL | ORAL_SOLUTION | Freq: Four times a day (QID) | ORAL | Status: AC | PRN

## 2011-11-06 NOTE — Progress Notes (Signed)
Subjective:    Patient ID: Brett [name], male    DOB: October 15, 1960, 51 y.o.   MRN: [id number]  HPI  51 yoM former smoker referred courtesy of Dr Oneta Rack for pulmonary evaluation because of  Cough , initial consult 06/15/11   06/21/2011 Acute OV  Returns for persistent cough. Seen last week for initial pulmonary consult for chronic cough.  CXR 05/05/11 w/ NAD.  Complains that he continues to have cough and wheezing on/off.  Last ov , given depo medrol shot.  Tx w/ hydromet. He was called in steroid taper few days ago.  Cough is better but not totally gone.  No hemoptysis or chest pain . No calf pain .  ++heartburn, not taking GERD meds.  >tx w/ pred taper and cyclical cough regimen  11/06/2011 Acute OV  Complains of prod cough with green mucus, increased SOB x1 month . Patient presents for an acute office visit for recurrent cough He was seen approximately 5 months ago for initial pulmonary consult Chest x-ray on May the third showed no acute process. He was treated for cyclical cough.-with a steroid taper and cough control regimen He reports that his cough did resolve until approximately one month ago. He initially had a productive cough with thick, green mucus. That resolved over one week and then  cough with clear to white mucus has persisted. Cough is mainly at nighttime He denies any overt sinus pain pressure, congestion, or overt reflux No fever or hemoptysis. Weight loss, abdominal pain, edema Is not using any over-the-counter cough medicines Has rare wheezing He has missed 2 followup appointments with Dr. Maple Hudson since last visit   Review of Systems Constitutional:   No  weight loss, night sweats,  Fevers, chills, fatigue, or  lassitude.  HEENT:   No headaches,  Difficulty swallowing,  Tooth/dental problems, or  Sore throat,                No sneezing, itching, ear ache, nasal congestion, post nasal drip,   CV:  No chest pain,  Orthopnea, PND, swelling in lower  extremities, anasarca, dizziness, palpitations, syncope.   GI   No  abdominal pain, nausea, vomiting, diarrhea, change in bowel habits, loss of appetite, bloody stools.   Resp:  No coughing up of blood.  Marland Kitchen  No chest wall deformity  Skin: no rash or lesions.  GU: no dysuria, change in color of urine, no urgency or frequency.  No flank pain, no hematuria   MS:  No joint pain or swelling.  No decreased range of motion.  No back pain.  Psych:  No change in mood or affect. No depression or anxiety.  No memory loss.    '     Objective:   Physical Exam GEN: A/Ox3; pleasant , NAD, well nourished   HEENT:  [place]/AT,  EACs-clear, TMs-wnl, NOSE-clear, THROAT-clear, no lesions, no postnasal drip or exudate noted.   NECK:  Supple w/ fair ROM; no JVD; normal carotid impulses w/o bruits; no thyromegaly or nodules palpated; no lymphadenopathy.  RESP  Clear  P & A; w/o, wheezes/ rales/ or rhonchi.no accessory muscle use, no dullness to percussion  CARD:  RRR, no m/r/g  , no peripheral edema, pulses intact, no cyanosis or clubbing.  GI:   Soft & nt; nml bowel sounds; no organomegaly or masses detected.  Musco: Warm bil, no deformities or joint swelling noted.   Neuro: alert, no focal deficits noted.    Skin: Warm, no lesions or rashes  Assessment & Plan:

## 2011-11-06 NOTE — Patient Instructions (Addendum)
Delsym 2 tsp Twice daily  Until cough is gone.  Hydromet 1-2 tsp every 4-6 hr As needed  Cough -may make you sleepy  GERD diet  Zyrtec 10mg  in am for 2 weeks  Chlortrimeton 4mg  2 At bedtime  For 2 weeks  Increase Zantac 150mg  1 Twice daily  Until seen back in office   follow up Dr. Maple Hudson  In 3-4 weeks with PFT  Please contact office for sooner follow up if symptoms do not improve or worsen or seek emergency care

## 2011-11-06 NOTE — Assessment & Plan Note (Signed)
Recurrent cyclical cough. Previous chest x-ray in May of this year was unremarkable. Will treat for atypical causes of cough including postnasal drip and reflux Have him to return for repeat pulmonary function test If cough persists may need to repeat chest x-ray and consider CT sinus  Plan Delsym 2 tsp Twice daily  Until cough is gone.  Hydromet 1-2 tsp every 4-6 hr As needed  Cough -may make you sleepy  GERD diet  Zyrtec 10mg  in am for 2 weeks  Chlortrimeton 4mg  2 At bedtime  For 2 weeks  Increase Zantac 150mg  1 Twice daily  Until seen back in office   follow up Dr. Maple Hudson  In 3-4 weeks with PFT  Please contact office for sooner follow up if symptoms do not improve or worsen or seek emergency care

## 2011-11-27 ENCOUNTER — Ambulatory Visit: Payer: Managed Care, Other (non HMO) | Admitting: Internal Medicine

## 2012-04-27 ENCOUNTER — Emergency Department (INDEPENDENT_AMBULATORY_CARE_PROVIDER_SITE_OTHER)
Admission: EM | Admit: 2012-04-27 | Discharge: 2012-04-27 | Disposition: A | Discharge status: Nursing Home (Includes ICF) | Payer: BC Managed Care – PPO | Source: Home / Self Care | Attending: Family Medicine | Admitting: Family Medicine

## 2012-04-27 ENCOUNTER — Encounter (HOSPITAL_COMMUNITY): Payer: Self-pay | Admitting: *Deleted

## 2012-04-27 ENCOUNTER — Encounter (HOSPITAL_COMMUNITY): Payer: Self-pay | Admitting: Emergency Medicine

## 2012-04-27 ENCOUNTER — Emergency Department (HOSPITAL_COMMUNITY)
Admission: EM | Admit: 2012-04-27 | Discharge: 2012-04-27 | Disposition: A | Discharge status: Home / Self Care | Payer: BC Managed Care – PPO | Attending: Emergency Medicine | Admitting: Emergency Medicine

## 2012-04-27 DIAGNOSIS — Y929 Unspecified place or not applicable: Secondary | ICD-10-CM | POA: Insufficient documentation

## 2012-04-27 DIAGNOSIS — Z87891 Personal history of nicotine dependence: Secondary | ICD-10-CM | POA: Insufficient documentation

## 2012-04-27 DIAGNOSIS — Y9389 Activity, other specified: Secondary | ICD-10-CM | POA: Insufficient documentation

## 2012-04-27 DIAGNOSIS — T180XXA Foreign body in mouth, initial encounter: Secondary | ICD-10-CM

## 2012-04-27 DIAGNOSIS — Z860100 Personal history of colon polyps, unspecified: Secondary | ICD-10-CM | POA: Insufficient documentation

## 2012-04-27 DIAGNOSIS — IMO0002 Reserved for concepts with insufficient information to code with codable children: Secondary | ICD-10-CM | POA: Insufficient documentation

## 2012-04-27 DIAGNOSIS — Z79899 Other long term (current) drug therapy: Secondary | ICD-10-CM | POA: Insufficient documentation

## 2012-04-27 DIAGNOSIS — Z8739 Personal history of other diseases of the musculoskeletal system and connective tissue: Secondary | ICD-10-CM | POA: Insufficient documentation

## 2012-04-27 DIAGNOSIS — E119 Type 2 diabetes mellitus without complications: Secondary | ICD-10-CM | POA: Insufficient documentation

## 2012-04-27 DIAGNOSIS — T18108A Unspecified foreign body in esophagus causing other injury, initial encounter: Secondary | ICD-10-CM | POA: Insufficient documentation

## 2012-04-27 DIAGNOSIS — K219 Gastro-esophageal reflux disease without esophagitis: Secondary | ICD-10-CM | POA: Insufficient documentation

## 2012-04-27 DIAGNOSIS — Z8719 Personal history of other diseases of the digestive system: Secondary | ICD-10-CM | POA: Insufficient documentation

## 2012-04-27 DIAGNOSIS — T189XXA Foreign body of alimentary tract, part unspecified, initial encounter: Secondary | ICD-10-CM

## 2012-04-27 DIAGNOSIS — T182XXA Foreign body in stomach, initial encounter: Secondary | ICD-10-CM | POA: Insufficient documentation

## 2012-04-27 DIAGNOSIS — T17208A Unspecified foreign body in pharynx causing other injury, initial encounter: Secondary | ICD-10-CM

## 2012-04-27 DIAGNOSIS — Z9889 Other specified postprocedural states: Secondary | ICD-10-CM | POA: Insufficient documentation

## 2012-04-27 NOTE — ED Notes (Signed)
Spoke with Dr Jearld Fenton. Request pt to be in ED room so he can evaluate him. Will page Dr Jearld Fenton when room becomes available.

## 2012-04-27 NOTE — ED Notes (Signed)
Dr. Byers at bedside.  

## 2012-04-27 NOTE — ED Provider Notes (Signed)
History     CSN: [id number]  Arrival date & time 04/27/12  1401   None     Chief Complaint  Patient presents with  . Swallowed Foreign Body    (Consider location/radiation/quality/duration/timing/severity/associated sxs/prior treatment) HPI Comments: Patient presents with foreign body sensation in the throat. He was referred to the ER for ENT evaluation by urgent care. Patient was eating fish a few hours ago and felt like something stuck in his throat when he swallowed. He feels like he can feel at the back of his tongue on the left side. No shortness of breath.  Patient is a 52 y.o. male presenting with foreign body swallowed.  Swallowed Foreign Body Pertinent negatives include no shortness of breath.    Past Medical History  Diagnosis Date  . Arthritis   . GERD (gastroesophageal reflux disease)   . Diabetes mellitus   . Anal fissure   . Colon polyps 01/13/11    Colonoscopy    Past Surgical History  Procedure Laterality Date  . Colonoscopy    . Anal fissure repair  02/24/11    Family History  Problem Relation Age of Onset  . Allergies Daughter   . Allergies Sister   . Asthma Daughter   . Rheum arthritis Mother     History  Substance Use Topics  . Smoking status: Former Smoker -- 0.25 packs/day for 27 years    Types: Cigarettes    Quit date: 06/01/2011  . Smokeless tobacco: Never Used     Comment: 7 ciggs a day  . Alcohol Use: Yes     Comment: socially      Review of Systems  HENT: Positive for sore throat. Negative for drooling, trouble swallowing and voice change.   Respiratory: Negative for shortness of breath.     Allergies  Dicyclomine  Home Medications   Current Outpatient Rx  Name  Route  Sig  Dispense  Refill  . albuterol (PROVENTIL HFA;VENTOLIN HFA) 108 (90 BASE) MCG/ACT inhaler   Inhalation   Inhale 2 puffs into the lungs every 6 (six) hours as needed for wheezing or shortness of breath.          . cholecalciferol (VITAMIN D) 1000  UNITS tablet   Oral   Take 1,000 Units by mouth daily.         Marland Kitchen dextromethorphan-guaiFENesin (MUCINEX DM) 30-600 MG per 12 hr tablet   Oral   Take 1 tablet by mouth every 12 (twelve) hours as needed (cough).          . docusate sodium (COLACE) 100 MG capsule   Oral   Take 100 mg by mouth 2 (two) times daily as needed. Constipation         . omeprazole (PRILOSEC) 20 MG capsule   Oral   Take 20 mg by mouth at bedtime.         . ranitidine (ZANTAC) 150 MG tablet   Oral   Take 150 mg by mouth daily.           BP 120/91  Pulse 86  Temp(Src) 98 F (36.7 C) (Oral)  Resp 16  SpO2 99%  Physical Exam  HENT:  Head: Normocephalic and atraumatic.  Mouth/Throat: Oropharynx is clear and moist.  Cardiovascular: Normal rate.   Pulmonary/Chest: Effort normal.    ED Course  Procedures (including critical care time)  Labs Reviewed - No data to display No results found.   Diagnosis: Foreign body in the throat    MDM  Medical screening exam performed on the patient as he was referred to the ER for ENT evaluation. Patient initially evaluated at urgent care. Patient does give a convincing history of possible foreign body in the throat. In no distress currently. No airway difficulty. Dr Jearld Fenton to see.        Gilda Crease, MD 04/27/12 [phone]

## 2012-04-27 NOTE — ED Notes (Signed)
Reports swallowing fish bone approx 1 hr ago.  Denies any difficulty breathing.  C/O difficulty swallowing.  Tried to drink water without success.  STates he has vomited x 2.

## 2012-04-27 NOTE — ED Provider Notes (Signed)
History     CSN: [id number]  Arrival date & time 04/27/12  1320   First MD Initiated Contact with Patient 04/27/12 1331      Chief Complaint  Patient presents with  . Swallowed Foreign Body    (Consider location/radiation/quality/duration/timing/severity/associated sxs/prior treatment) Patient is a 52 y.o. male presenting with foreign body swallowed. The history is provided by the patient and the spouse.  Swallowed Foreign Body This is a new problem. The current episode started 1 to 2 hours ago. The problem has been gradually worsening. Pertinent negatives include no shortness of breath. Associated symptoms comments: Eating fish and feels like bone in throat.. The symptoms are aggravated by swallowing.    Past Medical History  Diagnosis Date  . Arthritis   . GERD (gastroesophageal reflux disease)   . Diabetes mellitus   . Anal fissure   . Colon polyps 01/13/11    Colonoscopy    Past Surgical History  Procedure Laterality Date  . Colonoscopy    . Anal fissure repair  02/24/11    Family History  Problem Relation Age of Onset  . Allergies Daughter   . Allergies Sister   . Asthma Daughter   . Rheum arthritis Mother     History  Substance Use Topics  . Smoking status: Former Smoker -- 0.25 packs/day for 27 years    Types: Cigarettes    Quit date: 06/01/2011  . Smokeless tobacco: Never Used     Comment: 7 ciggs a day  . Alcohol Use: Yes     Comment: socially      Review of Systems  Constitutional: Negative.   HENT: Positive for trouble swallowing. Negative for drooling and voice change.   Respiratory: Negative.  Negative for shortness of breath, wheezing and stridor.     Allergies  Dicyclomine  Home Medications   Current Outpatient Rx  Name  Route  Sig  Dispense  Refill  . ranitidine (ZANTAC) 150 MG tablet   Oral   Take 150 mg by mouth daily.         Marland Kitchen albuterol (PROVENTIL HFA;VENTOLIN HFA) 108 (90 BASE) MCG/ACT inhaler   Inhalation   Inhale 2  puffs into the lungs every 6 (six) hours as needed.         . cholecalciferol (VITAMIN D) 1000 UNITS tablet   Oral   Take 1,000 Units by mouth daily.         Marland Kitchen dextromethorphan-guaiFENesin (MUCINEX DM) 30-600 MG per 12 hr tablet   Oral   Take 1 tablet by mouth as needed.         . docusate sodium (COLACE) 100 MG capsule   Oral   Take 100 mg by mouth 2 (two) times daily as needed. Constipation         . ibuprofen (ADVIL) 200 MG tablet   Oral   Take 200 mg by mouth as needed.           BP 133/88  Pulse 94  Temp(Src) 98.8 F (37.1 C) (Oral)  Resp 18  SpO2 98%  Physical Exam  Nursing note and vitals reviewed. Constitutional: He is oriented to person, place, and time. He appears well-developed and well-nourished. He appears distressed.  HENT:  Head: Normocephalic.  Mouth/Throat: Oropharynx is clear and moist.  No fb visible on exam of oropharynx.  Neck: Normal range of motion.  Pulmonary/Chest: Breath sounds normal.  Lymphadenopathy:    He has no cervical adenopathy.  Neurological: He is alert and oriented to  person, place, and time.  Skin: Skin is warm and dry.  Psychiatric: His mood appears anxious.    ED Course  Procedures (including critical care time)  Labs Reviewed - No data to display No results found.   1. Foreign body alimentary tract, initial encounter       MDM  Discussed with dr Jearld Fenton , rec ER transfer for further eval and care., pt agrees.        Linna Hoff, MD 04/27/12 1359

## 2012-04-27 NOTE — Consult Note (Signed)
Reason for Consult:Fishbone in the throat Referring Physician: urgency room  Brett [name] is an 52 y.o. male.  HPI: T39-year-old with an acute episode of a fish bone somewhere in his throat after eating fish approximately 2 hours ago. He is hurting in the left jugular digastric region. He is still able to swallow but it is very painful. He has not had this problem previously. He tried to eat but there has been no improvement in the discomfort.  Past Medical History  Diagnosis Date  . Arthritis   . GERD (gastroesophageal reflux disease)   . Diabetes mellitus   . Anal fissure   . Colon polyps 01/13/11    Colonoscopy    Past Surgical History  Procedure Laterality Date  . Colonoscopy    . Anal fissure repair  02/24/11    Family History  Problem Relation Age of Onset  . Allergies Daughter   . Allergies Sister   . Asthma Daughter   . Rheum arthritis Mother     Social History:  reports that he quit smoking about 10 months ago. His smoking use included Cigarettes. He has a 6.75 pack-year smoking history. He has never used smokeless tobacco. He reports that  drinks alcohol. He reports that he does not use illicit drugs.  Allergies:  Allergies  Allergen Reactions  . Dicyclomine Hives    Medications: I have reviewed the patient's current medications.  No results found for this or any previous visit (from the past 48 hour(s)).  No results found.  Review of Systems  Constitutional: Negative.   HENT: Positive for sore throat.   Eyes: Negative.   Respiratory: Negative.   Cardiovascular: Negative.   Skin: Negative.    Blood pressure 120/91, pulse 86, temperature 98 F (36.7 C), temperature source Oral, resp. rate 16, SpO2 99.00%. Physical Exam  Constitutional: He appears well-developed.  HENT:  Right Ear: External ear normal.  Left Ear: External ear normal.  Nose: Nose normal.  There is a bone in the left tonsil after fiberoptic exam through the left nasal cavity. The  tongue, vallecula, and piriform sinus were without any evidence of foreign body. Both vocal cords move normally.  Neck: Normal range of motion. Neck supple.  Cardiovascular: Normal rate.   Respiratory: Effort normal.    Assessment/Plan: Fishbone in the left tonsil-he was sprayed with Cetacaine spray after discussing the procedure including risks, benefits, and options. All his questions are answered and consent was obtained. He was sprayed multiple times and after a fiberoptic exam of the larynx the bone was identified looking from the posterior aspect of the tonsil. Once it was identified in that location looking anteriorly through his mouth it was seen. A alligator forcep was used along with a tongue blade to grasp the bone and removed without difficulty. He tolerated this well. The pain was immediately improved.the pain should improve over the next 24 hours but not be anywhere near as acute. If he persists with pain he should followup in the office otherwise as needed.  Suzanna Obey 04/27/2012, 4:34 PM

## 2012-04-27 NOTE — ED Notes (Signed)
Pt. Stated, i swallowed a fish bone about a hour ago.  Pt. Gagging and spitting up just mucous.

## 2013-01-14 ENCOUNTER — Encounter: Payer: Self-pay | Admitting: *Deleted

## 2013-01-15 ENCOUNTER — Encounter: Payer: Self-pay | Admitting: Emergency Medicine

## 2013-03-17 ENCOUNTER — Encounter: Payer: Self-pay | Admitting: Emergency Medicine

## 2013-03-17 ENCOUNTER — Ambulatory Visit (INDEPENDENT_AMBULATORY_CARE_PROVIDER_SITE_OTHER): Payer: BC Managed Care – PPO | Admitting: Emergency Medicine

## 2013-03-17 VITALS — BP 120/78 | HR 66 | Temp 98.2°F | Resp 18 | Ht 65.5 in | Wt 165.0 lb

## 2013-03-17 DIAGNOSIS — Z125 Encounter for screening for malignant neoplasm of prostate: Secondary | ICD-10-CM

## 2013-03-17 DIAGNOSIS — Z79899 Other long term (current) drug therapy: Secondary | ICD-10-CM

## 2013-03-17 DIAGNOSIS — I10 Essential (primary) hypertension: Secondary | ICD-10-CM

## 2013-03-17 DIAGNOSIS — K219 Gastro-esophageal reflux disease without esophagitis: Secondary | ICD-10-CM

## 2013-03-17 DIAGNOSIS — Z0000 Encounter for general adult medical examination without abnormal findings: Secondary | ICD-10-CM

## 2013-03-17 DIAGNOSIS — Z111 Encounter for screening for respiratory tuberculosis: Secondary | ICD-10-CM

## 2013-03-17 DIAGNOSIS — F17200 Nicotine dependence, unspecified, uncomplicated: Secondary | ICD-10-CM

## 2013-03-17 DIAGNOSIS — E559 Vitamin D deficiency, unspecified: Secondary | ICD-10-CM

## 2013-03-17 DIAGNOSIS — M79643 Pain in unspecified hand: Secondary | ICD-10-CM

## 2013-03-17 LAB — CBC WITH DIFFERENTIAL/PLATELET
BASOS ABS: 0.1 10*3/uL (ref 0.0–0.1)
Basophils Relative: 1 % (ref 0–1)
EOS ABS: 0.1 10*3/uL (ref 0.0–0.7)
EOS PCT: 2 % (ref 0–5)
HEMATOCRIT: 41.9 % (ref 39.0–52.0)
Hemoglobin: 13.6 g/dL (ref 13.0–17.0)
LYMPHS PCT: 44 % (ref 12–46)
Lymphs Abs: 3.3 10*3/uL (ref 0.7–4.0)
MCH: 25.8 pg — AB (ref 26.0–34.0)
MCHC: 32.5 g/dL (ref 30.0–36.0)
MCV: 79.5 fL (ref 78.0–100.0)
MONO ABS: 0.5 10*3/uL (ref 0.1–1.0)
Monocytes Relative: 7 % (ref 3–12)
Neutro Abs: 3.4 10*3/uL (ref 1.7–7.7)
Neutrophils Relative %: 46 % (ref 43–77)
PLATELETS: 252 10*3/uL (ref 150–400)
RBC: 5.27 MIL/uL (ref 4.22–5.81)
RDW: 13.8 % (ref 11.5–15.5)
WBC: 7.4 10*3/uL (ref 4.0–10.5)

## 2013-03-17 LAB — HEMOGLOBIN A1C
Hgb A1c MFr Bld: 5.7 % — ABNORMAL HIGH (ref <5.7)
Mean Plasma Glucose: 117 mg/dL — ABNORMAL HIGH (ref <117)

## 2013-03-17 NOTE — Patient Instructions (Addendum)
We want weight loss that will last so you should lose 1-2 pounds a week.  THAT IS IT! Please pick THREE things a month to change. Once it is a habit check off the item. Then pick another three items off the list to become habits.  If you are already doing a habit on the list GREAT!  Cross that item off! o Don't drink your calories. Ie, alcohol, soda, fruit juice, and sweet tea.  o Drink more water. Drink a glass when you feel hungry or before each meal.  o Eat breakfast - Complex carb and protein (likeDannon light and fit yogurt, oatmeal, fruit, eggs, [place] bacon). o Measure your cereal.  Eat no more than one cup a day. (ie [place]) o Eat an apple a day. o Add a vegetable a day. o Try a new vegetable a month. o Use Pam! Stop using oil or butter to cook. o Don't finish your plate or use smaller plates. o Share your dessert. o Eat sugar free Jello for dessert or frozen grapes. o Don't eat 2-3 hours before bed. o Switch to whole wheat bread, pasta, and brown rice. o Make healthier choices when you eat out. No fries! o Pick baked chicken, NOT fried. o Don't forget to SLOW DOWN when you eat. It is not going anywhere.  o Take the stairs. o Park far away in the parking lot o [employer] (or weights) for 10 minutes while watching TV. o Walk at work for 10 minutes during break. o Walk outside 1 time a week with your friend, kids, dog, or significant other. o Start a walking group at church. o Walk the mall as much as you can tolerate.  o Keep a food diary. o Weigh yourself daily. o Walk for 15 minutes 3 days per week. o Cook at home more often and eat out less.  If life happens and you go back to old habits, it is okay.  Just start over. You can do it!   If you experience chest pain, get short of breath, or tired during the exercise, please stop immediately and inform your doctor.    Bad carbs also include fruit juice, alcohol, and sweet tea. These are empty calories that do not signal to  your brain that you are full.   Please remember the good carbs are still carbs which convert into sugar. So please measure them out no more than 1/2-1 cup of rice, oatmeal, pasta, and beans.  Veggies are however free foods! Pile them on.   I like lean protein at every meal such as chicken, [place], pork chops, cottage cheese, etc. Just do not fry these meats and please center your meal around vegetable, the meats should be a side dish.   No all fruit is created equal. Please see the list below, the fruit at the bottom is higher in sugars than the fruit at the top   Gout Gout is when your joints become red, sore, and swell (inflammed). This is caused by the buildup of uric acid crystals in the joints. Uric acid is a chemical that is normally in the blood. If the level of uric acid gets too high in the blood, these crystals form in your joints and tissues. Over time, these crystals can form into masses near the joints and tissues. These masses can destroy bone and cause the bone to look misshapen (deformed). HOME CARE   Do not take aspirin for pain.  Only take medicine as told  by your doctor.  Rest the joint as much as you can. When in bed, keep sheets and blankets off painful areas.  Keep the sore joints raised (elevated).  Put warm or cold packs on painful joints. Use of warm or cold packs depends on which works best for you.  Use crutches if the painful joint is in your leg.  Drink enough fluids to keep your pee (urine) clear or pale yellow. Limit alcohol, sugary drinks, and drinks with fructose in them.  Follow your diet instructions. Pay careful attention to how much protein you eat. Include fruits, vegetables, whole grains, and fat-free or low-fat milk products in your daily diet. Talk to your doctor or dietician about the use of coffee, vitamin C, and cherries. These may help lower uric acid levels.  Keep a healthy body weight. GET HELP RIGHT AWAY IF:   You have watery poop  (diarrhea), throw up (vomit), or have any side effects from medicines.  You do not feel better in 24 hours, or you are getting worse.  Your joint becomes suddenly more tender, and you have chills or a fever. MAKE SURE YOU:   Understand these instructions.  Will watch your condition.  Will get help right away if you are not doing well or get worse. Document Released: 09/28/2007 Document Revised: 04/15/2012 Document Reviewed: 03/29/2009 [facility] LLCExitCare Patient Information 2014 [place]ExitCare, [place]. Diet for Gastroesophageal Reflux Disease, Adult Reflux is when stomach acid flows up into the esophagus. The esophagus becomes irritated and sore (inflammation). When reflux happens often and is severe, it is called gastroesophageal reflux disease (GERD). What you eat can help ease any discomfort caused by GERD. FOODS OR DRINKS TO AVOID OR LIMIT  Coffee and black tea, with or without caffeine.  Bubbly (carbonated) drinks with caffeine or energy drinks.  Strong spices, such as pepper, cayenne pepper, curry, or chili powder.  Peppermint or spearmint.  Chocolate.  High-fat foods, such as meats, fried food, oils, butter, or nuts.  Fruits and vegetables that cause discomfort. This includes citrus fruits and tomatoes.  Alcohol. If a certain food or drink irritates your GERD, avoid eating or drinking it. THINGS THAT MAY HELP GERD INCLUDE:  Eat meals slowly.  Eat 5 to 6 small meals a day, not 3 large meals.  Do not eat food for a certain amount of time if it causes discomfort.  Wait 3 hours after eating before lying down.  Keep the head of your bed raised 6 to 9 inches (15 23 centimeters). Put a foam wedge or blocks under the legs of the bed.  Stay active. Weight loss, if needed, may help ease your discomfort.  Wear loose-fitting clothing.  Do not smoke or chew tobacco. Document Released: 06/20/2011 Document Reviewed: 06/20/2011 [facility]wellExitCare Patient Information 2014 [place]ExitCare, [place]LLC.

## 2013-03-18 LAB — URINALYSIS, ROUTINE W REFLEX MICROSCOPIC
Bilirubin Urine: NEGATIVE
GLUCOSE, UA: NEGATIVE mg/dL
Hgb urine dipstick: NEGATIVE
Ketones, ur: NEGATIVE mg/dL
LEUKOCYTES UA: NEGATIVE
Nitrite: NEGATIVE
PROTEIN: NEGATIVE mg/dL
SPECIFIC GRAVITY, URINE: 1.025 (ref 1.005–1.030)
UROBILINOGEN UA: 0.2 mg/dL (ref 0.0–1.0)
pH: 7.5 (ref 5.0–8.0)

## 2013-03-18 LAB — HEPATIC FUNCTION PANEL
ALT: 19 U/L (ref 0–53)
AST: 20 U/L (ref 0–37)
Albumin: 4.2 g/dL (ref 3.5–5.2)
Alkaline Phosphatase: 84 U/L (ref 39–117)
BILIRUBIN DIRECT: 0.1 mg/dL (ref 0.0–0.3)
BILIRUBIN INDIRECT: 0.4 mg/dL (ref 0.2–1.2)
BILIRUBIN TOTAL: 0.5 mg/dL (ref 0.2–1.2)
Total Protein: 6.7 g/dL (ref 6.0–8.3)

## 2013-03-18 LAB — BASIC METABOLIC PANEL WITH GFR
BUN: 11 mg/dL (ref 6–23)
CALCIUM: 9.6 mg/dL (ref 8.4–10.5)
CO2: 27 mEq/L (ref 19–32)
CREATININE: 1.19 mg/dL (ref 0.50–1.35)
Chloride: 103 mEq/L (ref 96–112)
GFR, EST AFRICAN AMERICAN: 81 mL/min
GFR, EST NON AFRICAN AMERICAN: 70 mL/min
Glucose, Bld: 82 mg/dL (ref 70–99)
Potassium: 4.4 mEq/L (ref 3.5–5.3)
Sodium: 137 mEq/L (ref 135–145)

## 2013-03-18 LAB — LIPID PANEL
CHOL/HDL RATIO: 3 ratio
CHOLESTEROL: 163 mg/dL (ref 0–200)
HDL: 54 mg/dL (ref >39)
LDL CALC: 97 mg/dL (ref 0–99)
TRIGLYCERIDES: 62 mg/dL (ref <150)
VLDL: 12 mg/dL (ref 0–40)

## 2013-03-18 LAB — INSULIN, FASTING: INSULIN FASTING, SERUM: 4 u[IU]/mL (ref 3–28)

## 2013-03-18 LAB — VITAMIN D 25 HYDROXY (VIT D DEFICIENCY, FRACTURES): Vit D, 25-Hydroxy: 32 ng/mL (ref 30–89)

## 2013-03-18 LAB — PSA: PSA: 0.9 ng/mL (ref <=4.00)

## 2013-03-18 LAB — MICROALBUMIN / CREATININE URINE RATIO
CREATININE, URINE: 340.1 mg/dL
MICROALB UR: 1.06 mg/dL (ref 0.00–1.89)
Microalb Creat Ratio: 3.1 mg/g (ref 0.0–30.0)

## 2013-03-18 LAB — MAGNESIUM: MAGNESIUM: 1.8 mg/dL (ref 1.5–2.5)

## 2013-03-18 LAB — URIC ACID: Uric Acid, Serum: 6.6 mg/dL (ref 4.0–7.8)

## 2013-03-18 LAB — TESTOSTERONE: Testosterone: 467 ng/dL (ref 300–890)

## 2013-03-18 LAB — TSH: TSH: 0.041 u[IU]/mL — ABNORMAL LOW (ref 0.350–4.500)

## 2013-03-19 ENCOUNTER — Other Ambulatory Visit: Payer: Self-pay | Admitting: Emergency Medicine

## 2013-03-19 DIAGNOSIS — E0590 Thyrotoxicosis, unspecified without thyrotoxic crisis or storm: Secondary | ICD-10-CM

## 2013-03-19 LAB — TB SKIN TEST
Induration: 0 mm
TB Skin Test: NEGATIVE

## 2013-03-20 NOTE — Progress Notes (Signed)
Subjective:    Patient ID: Brett [name], male    DOB: 04/13/1960, 53 y.o.   MRN: [id number]  HPI Comments: 53 yo AAM CPE and overdue presents for 3 month F/U for Hypogonadism, Cholesterol, Pre-Dm, D. Deficient. He did not f/u AD for recheck of labs. He has been improving diet. He is exercising routinely and has busy job. He notes BP good but rarely checks and his has Hx of labile HTN. He denies CV symptoms. He did not choose to start Rx testosterone at last labs and wanted to tr herbals. LAST LABS on 01/11/12  SED RATE/ RF/ URIC ACID/ CCP ALL NEG T 183 TG 46 H 65 L 109 A1C 6 TEST 235 PSA .91 INSULIN 13 D 31  He is still having hand pain despite neg labs 2014 and neg Xrays 2012 except for degenerative changes. He notes advil helps some. He notes they ache and feel locked up on/ off x 4 years. He is uncertain if his mother's severe arthritis is RA.  He notes his reflux is controled with diet and exercise and only uses Ranitidine with flare ups.   He notes he rarely uses inhaler due to rare flares. He notes flares usually occur with seasonal changes. He denies any symptoms currently.   Current Outpatient Prescriptions on File Prior to Visit  Medication Sig Dispense Refill  . albuterol (PROVENTIL HFA;VENTOLIN HFA) 108 (90 BASE) MCG/ACT inhaler Inhale 2 puffs into the lungs every 6 (six) hours as needed for wheezing or shortness of breath.       . cholecalciferol (VITAMIN D) 1000 UNITS tablet Take 1,000 Units by mouth daily.      Marland Kitchen. docusate sodium (COLACE) 100 MG capsule Take 100 mg by mouth 2 (two) times daily as needed. Constipation      . omeprazole (PRILOSEC) 20 MG capsule Take 20 mg by mouth at bedtime.       No current facility-administered medications on file prior to visit.   Allergies  Allergen Reactions  . Cialis [Tadalafil]     Hives  . Dicyclomine Hives  . Meloxicam     Rash  . Relafen [Nabumetone]     Hives   Past Medical History  Diagnosis Date  . Arthritis   . GERD  (gastroesophageal reflux disease)   . Diabetes mellitus   . Anal fissure   . Colon polyps 01/13/11    Colonoscopy   Past Surgical History  Procedure Laterality Date  . Colonoscopy    . Anal fissure repair  02/24/11   History  Substance Use Topics  . Smoking status: Current Every Day Smoker -- 0.25 packs/day for 27 years    Types: Cigarettes  . Smokeless tobacco: Never Used     Comment: 7 ciggs a day  . Alcohol Use: Yes     Comment: socially   Family History  Problem Relation Age of Onset  . Allergies Daughter   . Allergies Sister   . Asthma Daughter   . Rheum arthritis Mother    Patient Care Team: Lucky CowboyWilliam McKeown, MD as PCP - General (Internal Medicine) Waymon Budgelinton D Young, MD as Consulting Physician (Pulmonary Disease) Shirley FriarVincent C. Schooler, MD as Consulting Physician (Gastroenterology) Joelyn OmsScott M McGinley as Referring Physician (Orthopedic Surgery) Manson PasseyBrown, (EYE) Edward JollySilva, (Dentist)  MAINTENANCE: COLONOSCOPY, 01/13/11 PSA, 01/12/12 EYE, 2014 WNL DENTIST, Q 6 MONTH CXR 06/02/11 TDAP 2013 INFLUENZA 2014  Review of Systems  Musculoskeletal: Positive for arthralgias.  All other systems reviewed and are negative.   BP 120/78  Pulse 66  Temp(Src) 98.2 F (36.8 C) (Temporal)  Resp 18  Ht 5' 5.5" (1.664 m)  Wt 165 lb (74.844 kg)  BMI 27.03 kg/m2     Objective:   Physical Exam  Nursing note and vitals reviewed. Constitutional: He is oriented to person, place, and time. He appears well-developed and well-nourished.  HENT:  Head: Normocephalic and atraumatic.  Right Ear: External ear normal.  Left Ear: External ear normal.  Nose: Nose normal.  Eyes: Conjunctivae and EOM are normal. Pupils are equal, round, and reactive to light. Right eye exhibits no discharge. Left eye exhibits no discharge. No scleral icterus.  Neck: Normal range of motion. Neck supple. No JVD present. No tracheal deviation present. No thyromegaly present.  Cardiovascular: Normal rate, regular rhythm,  normal heart sounds and intact distal pulses.   Pulmonary/Chest: Effort normal and breath sounds normal.  Abdominal: Soft. Bowel sounds are normal. He exhibits no distension and no mass. There is no tenderness. There is no rebound and no guarding.  Genitourinary:  DEF 2016/ stool cards  Musculoskeletal: Normal range of motion. He exhibits no edema and no tenderness.  Mild discomfort with flexion of both hands  Lymphadenopathy:    He has no cervical adenopathy.  Neurological: He is alert and oriented to person, place, and time. He has normal reflexes. No cranial nerve deficit. He exhibits normal muscle tone. Coordination normal.  Skin: Skin is warm and dry. No rash noted. No erythema. No pallor.  Psychiatric: He has a normal mood and affect. His behavior is normal. Judgment and thought content normal.     AORTA SCAN WNL EKG NSCSPT, SINUS ARRYTHMIA     Assessment & Plan:  1. CPE- Update screening labs/ History/ Immunizations/ Testing as needed. Advised healthy diet, QD exercise, increase H20 and continue RX/ Vitamins AD.  2. OVERDUE 3 month F/U for HYPOGONADISM, Cholesterol, Pre-Dm, D. Deficient. Needs healthy diet, cardio QD and obtain healthy weight. Check Labs, Check BP if >130/80 call office. Advised needs to be compliant with f/u  3. Hand pain- Refer Rheum with NEG evaluation In past at our office with labs/ xrays and ? Fhx with mother  4. Tobacco Dep- advised cessation techniques and need for d/c to decrease Risk

## 2013-03-24 NOTE — Addendum Note (Signed)
Addended by: Loree FeeSMITH, Shella Lahman R on: 03/24/2013 08:45 PM   Modules accepted: Orders

## 2013-03-28 ENCOUNTER — Telehealth: Payer: Self-pay | Admitting: Internal Medicine

## 2013-04-03 ENCOUNTER — Encounter (HOSPITAL_COMMUNITY): Payer: BC Managed Care – PPO

## 2013-04-04 ENCOUNTER — Encounter (HOSPITAL_COMMUNITY): Payer: BC Managed Care – PPO

## 2013-04-14 ENCOUNTER — Encounter (HOSPITAL_COMMUNITY): Payer: BC Managed Care – PPO

## 2013-04-15 ENCOUNTER — Encounter (HOSPITAL_COMMUNITY): Payer: BC Managed Care – PPO

## 2013-04-28 ENCOUNTER — Ambulatory Visit (INDEPENDENT_AMBULATORY_CARE_PROVIDER_SITE_OTHER): Payer: BC Managed Care – PPO | Admitting: Emergency Medicine

## 2013-04-28 ENCOUNTER — Encounter: Payer: Self-pay | Admitting: Emergency Medicine

## 2013-04-28 VITALS — BP 122/70 | HR 84 | Temp 98.2°F | Resp 18 | Ht 65.5 in | Wt 165.0 lb

## 2013-04-28 DIAGNOSIS — E0590 Thyrotoxicosis, unspecified without thyrotoxic crisis or storm: Secondary | ICD-10-CM

## 2013-04-28 LAB — TSH: TSH: 0.067 u[IU]/mL — ABNORMAL LOW (ref 0.350–4.500)

## 2013-04-28 NOTE — Progress Notes (Signed)
   Subjective:    Patient ID: Brett [name], male    DOB: 08/19/1960, 53 y.o.   MRN: [id number]  HPI Comments: TSH 0.041, Patient did not have [place] Thyroid scan due to cost and high deductable. He notes occasionally a little trouble with swallowing. He denies any other SE of Hyperthyroid or unusual exposure.    Medication List       This list is accurate as of: 04/28/13 10:17 AM.  Always use your most recent med list.               albuterol 108 (90 BASE) MCG/ACT inhaler  Commonly known as:  PROVENTIL HFA;VENTOLIN HFA  Inhale 2 puffs into the lungs every 6 (six) hours as needed for wheezing or shortness of breath.     cholecalciferol 1000 UNITS tablet  Commonly known as:  VITAMIN D  Take 1,000 Units by mouth daily.     docusate sodium 100 MG capsule  Commonly known as:  COLACE  Take 100 mg by mouth 2 (two) times daily as needed. Constipation     omeprazole 20 MG capsule  Commonly known as:  PRILOSEC  Take 20 mg by mouth at bedtime.     vitamin E 400 UNIT capsule  Take 400 Units by mouth daily.       Allergies  Allergen Reactions  . Cialis [Tadalafil]     Hives  . Dicyclomine Hives  . Meloxicam     Rash  . Relafen [Nabumetone]     Hives   Past Medical History  Diagnosis Date  . Arthritis   . GERD (gastroesophageal reflux disease)   . Diabetes mellitus   . Anal fissure   . Colon polyps 01/13/11    Colonoscopy      Review of Systems  HENT: Positive for trouble swallowing.   All other systems reviewed and are negative.  BP 122/70  Pulse 84  Temp(Src) 98.2 F (36.8 C) (Temporal)  Resp 18  Ht 5' 5.5" (1.664 m)  Wt 165 lb (74.844 kg)  BMI 27.03 kg/m2     Objective:   Physical Exam  Nursing note and vitals reviewed. Constitutional: He is oriented to person, place, and time. He appears well-developed and well-nourished.  HENT:  Head: Normocephalic and atraumatic.  Right Ear: External ear normal.  Left Ear: External ear normal.  Nose: Nose normal.   Mouth/Throat: Oropharynx is clear and moist. No oropharyngeal exudate.  Eyes: Conjunctivae are normal.  Neck: Normal range of motion. Neck supple.  ? Mild asymetry anterior neck  Cardiovascular: Normal rate, regular rhythm, normal heart sounds and intact distal pulses.   Pulmonary/Chest: Effort normal and breath sounds normal.  Abdominal: Soft.  Musculoskeletal: Normal range of motion.  Lymphadenopathy:    He has no cervical adenopathy.  Neurological: He is alert and oriented to person, place, and time.  Skin: Skin is warm and dry.  Psychiatric: He has a normal mood and affect. Judgment normal.          Assessment & Plan:  Hyperthyroid-  Check labs, if abnormal will try Prednisone before the scan due to cost

## 2013-04-28 NOTE — Patient Instructions (Signed)
Hyperthyroidism  The thyroid is a large gland located in the lower front part of your neck. The thyroid helps control metabolism. Metabolism is how your body uses food. It controls metabolism with the hormone thyroxine. When the thyroid is overactive, it produces too much hormone. When this happens, these following problems may occur:   · Nervousness  · Heat intolerance  · Weight loss (in spite of increase food intake)  · Diarrhea  · Change in hair or skin texture  · Palpitations (heart skipping or having extra beats)  · Tachycardia (rapid heart rate)  · Loss of menstruation (amenorrhea)  · Shaking of the hands  CAUSES  · Grave's Disease (the immune system attacks the thyroid gland). This is the most common cause.  · Inflammation of the thyroid gland.  · Tumor (usually benign) in the thyroid gland or elsewhere.  · Excessive use of thyroid medications (both prescription and 'natural').  · Excessive ingestion of Iodine.  DIAGNOSIS   To prove hyperthyroidism, your caregiver may do blood tests and ultrasound tests. Sometimes the signs are hidden. It may be necessary for your caregiver to watch this illness with blood tests, either before or after diagnosis and treatment.  TREATMENT  Short-term treatment  There are several treatments to control symptoms. Drugs called beta blockers may give some relief. Drugs that decrease hormone production will provide temporary relief in many people. These measures will usually not give permanent relief.  Definitive therapy  There are treatments available which can be discussed between you and your caregiver which will permanently treat the problem. These treatments range from surgery (removal of the thyroid), to the use of radioactive iodine (destroys the thyroid by radiation), to the use of antithyroid drugs (interfere with hormone synthesis). The first two treatments are permanent and usually successful. They most often require hormone replacement therapy for life. This is because  it is impossible to remove or destroy the exact amount of thyroid required to make a person euthyroid (normal).  HOME CARE INSTRUCTIONS   See your caregiver if the problems you are being treated for get worse. Examples of this would be the problems listed above.  SEEK MEDICAL CARE IF:  Your general condition worsens.  MAKE SURE YOU:   · Understand these instructions.  · Will watch your condition.  · Will get help right away if you are not doing well or get worse.  Document Released: 12/19/2004 Document Revised: 03/13/2011 Document Reviewed: 05/02/2006  ExitCare® Patient Information ©2014 [employer].

## 2013-04-29 ENCOUNTER — Other Ambulatory Visit: Payer: Self-pay | Admitting: Emergency Medicine

## 2013-04-29 MED ORDER — PREDNISONE 10 MG PO TABS: ORAL_TABLET | ORAL | Status: DC

## 2013-05-15 NOTE — Telephone Encounter (Signed)
err

## 2013-05-19 ENCOUNTER — Other Ambulatory Visit: Payer: Self-pay

## 2014-01-19 ENCOUNTER — Encounter: Payer: Self-pay | Admitting: Emergency Medicine

## 2014-03-19 ENCOUNTER — Encounter: Payer: Self-pay | Admitting: Emergency Medicine

## 2014-03-23 ENCOUNTER — Ambulatory Visit (INDEPENDENT_AMBULATORY_CARE_PROVIDER_SITE_OTHER): Payer: BLUE CROSS/BLUE SHIELD | Admitting: Internal Medicine

## 2014-03-23 ENCOUNTER — Encounter: Payer: Self-pay | Admitting: Internal Medicine

## 2014-03-23 VITALS — BP 120/80 | HR 74 | Temp 98.0°F | Resp 16 | Ht 65.5 in | Wt 165.0 lb

## 2014-03-23 DIAGNOSIS — E785 Hyperlipidemia, unspecified: Secondary | ICD-10-CM

## 2014-03-23 DIAGNOSIS — R6889 Other general symptoms and signs: Secondary | ICD-10-CM

## 2014-03-23 DIAGNOSIS — Z720 Tobacco use: Secondary | ICD-10-CM

## 2014-03-23 DIAGNOSIS — R7303 Prediabetes: Secondary | ICD-10-CM

## 2014-03-23 DIAGNOSIS — I10 Essential (primary) hypertension: Secondary | ICD-10-CM

## 2014-03-23 DIAGNOSIS — R5383 Other fatigue: Secondary | ICD-10-CM

## 2014-03-23 DIAGNOSIS — Z0001 Encounter for general adult medical examination with abnormal findings: Secondary | ICD-10-CM

## 2014-03-23 DIAGNOSIS — R7309 Other abnormal glucose: Secondary | ICD-10-CM

## 2014-03-23 DIAGNOSIS — Z79899 Other long term (current) drug therapy: Secondary | ICD-10-CM | POA: Insufficient documentation

## 2014-03-23 DIAGNOSIS — J069 Acute upper respiratory infection, unspecified: Secondary | ICD-10-CM

## 2014-03-23 DIAGNOSIS — Z125 Encounter for screening for malignant neoplasm of prostate: Secondary | ICD-10-CM

## 2014-03-23 DIAGNOSIS — Z1212 Encounter for screening for malignant neoplasm of rectum: Secondary | ICD-10-CM

## 2014-03-23 DIAGNOSIS — K219 Gastro-esophageal reflux disease without esophagitis: Secondary | ICD-10-CM

## 2014-03-23 DIAGNOSIS — E559 Vitamin D deficiency, unspecified: Secondary | ICD-10-CM

## 2014-03-23 DIAGNOSIS — M79641 Pain in right hand: Secondary | ICD-10-CM

## 2014-03-23 DIAGNOSIS — M79642 Pain in left hand: Secondary | ICD-10-CM

## 2014-03-23 LAB — CBC WITH DIFFERENTIAL/PLATELET
BASOS PCT: 0 % (ref 0–1)
Basophils Absolute: 0 10*3/uL (ref 0.0–0.1)
EOS ABS: 0.2 10*3/uL (ref 0.0–0.7)
EOS PCT: 2 % (ref 0–5)
HCT: 43 % (ref 39.0–52.0)
Hemoglobin: 14.1 g/dL (ref 13.0–17.0)
Lymphocytes Relative: 36 % (ref 12–46)
Lymphs Abs: 2.7 10*3/uL (ref 0.7–4.0)
MCH: 26.7 pg (ref 26.0–34.0)
MCHC: 32.8 g/dL (ref 30.0–36.0)
MCV: 81.3 fL (ref 78.0–100.0)
MPV: 9.7 fL (ref 8.6–12.4)
Monocytes Absolute: 0.5 10*3/uL (ref 0.1–1.0)
Monocytes Relative: 7 % (ref 3–12)
Neutro Abs: 4.2 10*3/uL (ref 1.7–7.7)
Neutrophils Relative %: 55 % (ref 43–77)
PLATELETS: 280 10*3/uL (ref 150–400)
RBC: 5.29 MIL/uL (ref 4.22–5.81)
RDW: 13.5 % (ref 11.5–15.5)
WBC: 7.6 10*3/uL (ref 4.0–10.5)

## 2014-03-23 LAB — HEMOGLOBIN A1C
HEMOGLOBIN A1C: 6.1 % — AB (ref <5.7)
Mean Plasma Glucose: 128 mg/dL — ABNORMAL HIGH (ref <117)

## 2014-03-23 MED ORDER — PREDNISONE 20 MG PO TABS: ORAL_TABLET | ORAL | Status: DC

## 2014-03-23 MED ORDER — GLUCOSE BLOOD VI STRP: ORAL_STRIP | Status: DC

## 2014-03-23 MED ORDER — AZITHROMYCIN 250 MG PO TABS: ORAL_TABLET | ORAL | Status: DC

## 2014-03-23 NOTE — Progress Notes (Signed)
Patient ID: Brett [name], male   DOB: [birth year], 54 y.o.   MRN: [id number]  Complete Physical  Assessment and Plan:  1. Medication management  - CBC with Differential/Platelet - BASIC METABOLIC PANEL WITH GFR - Hepatic function panel - Magnesium  2. Vitamin D deficiency -cont supplement - Vit D  25 hydroxy (rtn osteoporosis monitoring)  3. Hyperlipemia -cont diet and exercise - Lipid panel  4. Other fatigue -check labs today - Vitamin B12 - Testosterone - Iron and TIBC - TSH  5. Screening for rectal cancer -exam in office reveals no frank blood - POC Hemoccult Bld/Stl (3-Cd Home Screen); Future  6. Screening for prostate cancer  - PSA  7. Elevated random blood glucose level-prediabetic on last testing.   - Insulin, fasting - Hemoglobin A1c - glucose blood test strip; Use as instructed  Dispense: 100 each; Refill: 0  8. Tobacco abuse -discussed methods of quitting, patient wants to try it on his own - EKG 12-Lead - [place], RETROPERITNL ABD,  LTD  9. Bilateral hand pain -Ddx includes inflammatory arthritis (RA, psoriatic, lupus etc), osteoarthritis vs. gout.  Exam shows no external evidence of arthritis although patient is tender to palpation of the MCPs in bilateral hands.  No redness no concern for septic joint.  Check labs.  Refer to hand at patients request.    - Sedimentation rate - Angiotensin converting enzyme - Anti-DNA antibody, double-stranded - ANA - C3 and C4 - Cyclic citrul peptide antibody, IgG - C-reactive protein - Rheumatoid factor - Complement, total - Ambulatory referral to Orthopedics - Uric acid - ANCA screen with reflex titer  10. Acute URI Ddx includes viral infection, allergic rhinitis, and sinusitis.  Recommended daily antihistamine and nasal saline also.    - azithromycin (ZITHROMAX) 250 MG tablet; Take 2 tablets (500 mg) on  Day 1,  followed by 1 tablet (250 mg) once daily on Days 2 through 5.  Dispense: 6 each; Refill: 1 -  predniSONE (DELTASONE) 20 MG tablet; 3 tabs po day one, then 2 tabs daily x 4 days  Dispense: 11 tablet; Refill: 0  11.  Globulus sensation - refer to GI -cont PPI -may need endoscopy -recommended quitting smoking -covered precautions for food bolus   Discussed med's effects and SE's. Screening labs and tests as requested with regular follow-up as recommended.  HPI Patient presents for a complete physical.    He is not on cholesterol medication and denies myalgias. His cholesterol is at goal. The cholesterol last visit was:   Lab Results  Component Value Date   CHOL 163 03/17/2013   HDL 54 03/17/2013   LDLCALC 97 03/17/2013   TRIG 62 03/17/2013   CHOLHDL 3.0 03/17/2013    He has been working on diet and exercise for prediabetes, he is not on bASA, he is not on ACE/ARB and denies foot ulcerations, nausea, paresthesia of the feet, polydipsia, polyuria, visual disturbances, vomiting and weight loss. Last A1C in the office was:  Lab Results  Component Value Date   HGBA1C 5.7* 03/17/2013    Patient is on Vitamin D supplement.   Lab Results  Component Value Date   VD25OH 32 03/17/2013      Last PSA was: Lab Results  Component Value Date   PSA 0.90 03/17/2013  .  Denies BPH symptoms daytime frequency, double voiding, dysuria, hematuria, hesitancy, incontinence, intermittency, nocturia, sensation of incomplete bladder emptying, suprapubic pain, urgency or weak urinary stream.  Patient admits to a sensation of globulus.  He  reports that it has been worsening for the last 3-4 years.  He does have an issue with reflux and does have some issues with weekly flares.  He does report that he was told at one time that he needed to have his esophagus stretched.  He does not currently see a GI doctor.  He saw a Dr. At [facility] he believes.   Bilateral hand pain.  It has been bothering him for years.  He finds that it is worse in the morning.  The pain is a constant aching in his knuckles on  both hands.  He would prefer to not take any medications and be referred directly to them.    Patient also complains of cough, congestion, and some nasal congestion x 1 week.  He has tried nothing for this.    Current Medications:  Current Outpatient Prescriptions on File Prior to Visit  Medication Sig Dispense Refill  . albuterol (PROVENTIL HFA;VENTOLIN HFA) 108 (90 BASE) MCG/ACT inhaler Inhale 2 puffs into the lungs every 6 (six) hours as needed for wheezing or shortness of breath.     . cholecalciferol (VITAMIN D) 1000 UNITS tablet Take 1,000 Units by mouth daily.    Marland Kitchen docusate sodium (COLACE) 100 MG capsule Take 100 mg by mouth 2 (two) times daily as needed. Constipation    . vitamin E 400 UNIT capsule Take 400 Units by mouth daily.    Marland Kitchen omeprazole (PRILOSEC) 20 MG capsule Take 20 mg by mouth at bedtime.     No current facility-administered medications on file prior to visit.    Health Maintenance:  Immunization History  Administered Date(s) Administered  . Influenza Whole 10/31/2006, 11/03/2010, 11/02/2011  . Influenza-Unspecified 01/03/2012  . PPD Test 03/17/2013  . Pneumococcal-Unspecified 10/13/2009  . Td 01/03/2008  . Tdap 01/03/2011     Patient Care Team: Lucky Cowboy, MD as PCP - General (Internal Medicine) Waymon Budge, MD as Consulting Physician (Pulmonary Disease) Charlott Rakes, MD as Consulting Physician (Gastroenterology) Joelyn Oms, MD as Referring Physician (Orthopedic Surgery)  Allergies:  Allergies  Allergen Reactions  . Cialis [Tadalafil]     Hives  . Dicyclomine Hives  . Meloxicam     Rash  . Relafen [Nabumetone]     Hives    Medical History:  Past Medical History  Diagnosis Date  . Arthritis   . GERD (gastroesophageal reflux disease)   . Diabetes mellitus   . Anal fissure   . Colon polyps 01/13/11    Colonoscopy    Surgical History:  Past Surgical History  Procedure Laterality Date  . Colonoscopy    . Anal fissure  repair  02/24/11    Family History:  Family History  Problem Relation Age of Onset  . Allergies Daughter   . Allergies Sister   . Asthma Daughter   . Rheum arthritis Mother     Social History:   History  Substance Use Topics  . Smoking status: Current Every Day Smoker -- 0.25 packs/day for 27 years    Types: Cigarettes  . Smokeless tobacco: Never Used     Comment: 7 ciggs a day  . Alcohol Use: Yes     Comment: socially    Review of Systems:  Review of Systems  Constitutional: Negative for fever, chills and weight loss.  HENT: Negative for congestion, ear pain, sore throat and tinnitus.   Eyes: Negative.   Respiratory: Positive for cough (1 week). Negative for hemoptysis, sputum production, shortness of breath and wheezing.  Cardiovascular: Negative for chest pain, palpitations and leg swelling.  Gastrointestinal: Positive for heartburn. Negative for nausea, vomiting, abdominal pain, diarrhea, constipation, blood in stool and melena.       Globulus sensation  Genitourinary: Negative for dysuria, urgency, frequency, hematuria and flank pain.  Musculoskeletal: Positive for myalgias and joint pain.  Skin: Negative.   Neurological: Negative for dizziness, focal weakness and headaches.    Physical Exam: Estimated body mass index is 27.03 kg/(m^2) as calculated from the following:   Height as of this encounter: 5' 5.5" (1.664 m).   Weight as of this encounter: 165 lb (74.844 kg). BP 120/80 mmHg  Pulse 74  Temp(Src) 98 F (36.7 C) (Temporal)  Resp 16  Ht 5' 5.5" (1.664 m)  Wt 165 lb (74.844 kg)  BMI 27.03 kg/m2  General Appearance: Well nourished, in no apparent distress.  Eyes: PERRLA, EOMs, conjunctiva no swelling or erythema ENT/Mouth: Ear canals clear bilaterally with no erythema, swelling, discharge.  TMs normal bilaterally with no erythema, bulging, or retractions.  Oropharynx clear and moist with no exudate, swelling, or erythema.  Dentition normal.   Neck:  Supple, thyroid normal. No bruits, JVD, cervical adenopathy Respiratory: Respiratory effort normal, BS equal bilaterally without rales, rhonchi, wheezing or stridor.  Cardio: RRR without murmurs, rubs or gallops. Brisk peripheral pulses without edema.  Chest: symmetric, with normal excursions Abdomen: Soft, nontender, no guarding, rebound, hernias, masses, or organomegaly. Genitourinary:  Musculoskeletal: Full ROM all peripheral extremities,5/5 strength, and normal gait.  Skin: Warm, dry without rashes, lesions, ecchymosis. Neuro: A&Ox3, Cranial nerves intact, reflexes equal bilaterally. Normal muscle tone, no cerebellar symptoms. Sensation intact.  Psych: Normal affect, Insight and Judgment appropriate.   EKG: WNL no changes.  AORTA SCAN: WNL  FORCUCCI, Maddi Collar 10:39 AM [facility]

## 2014-03-23 NOTE — Patient Instructions (Signed)
Preventive Care for Adults  A healthy lifestyle and preventive care can promote health and wellness. Preventive health guidelines for men include the following key practices:  A routine yearly physical is a good way to check with your health care provider about your health and preventative screening. It is a chance to share any concerns and updates on your health and to receive a thorough exam.  Visit your dentist for a routine exam and preventative care every 6 months. Brush your teeth twice a day and floss once a day. Good oral hygiene prevents tooth decay and gum disease.  The frequency of eye exams is based on your age, health, family medical history, use of contact lenses, and other factors. Follow your health care provider's recommendations for frequency of eye exams.  Eat a healthy diet. Foods such as vegetables, fruits, whole grains, low-fat dairy products, and lean protein foods contain the nutrients you need without too many calories. Decrease your intake of foods high in solid fats, added sugars, and salt. Eat the right amount of calories for you.Get information about a proper diet from your health care provider, if necessary.  Regular physical exercise is one of the most important things you can do for your health. Most adults should get at least 150 minutes of moderate-intensity exercise (any activity that increases your heart rate and causes you to sweat) each week. In addition, most adults need muscle-strengthening exercises on 2 or more days a week.  Maintain a healthy weight. The body mass index (BMI) is a screening tool to identify possible weight problems. It provides an estimate of body fat based on height and weight. Your health care provider can find your BMI and can help you achieve or maintain a healthy weight.For adults 20 years and older:  A BMI below 18.5 is considered underweight.  A BMI of 18.5 to 24.9 is normal.  A BMI of 25 to 29.9 is considered overweight.  A  BMI of 30 and above is considered obese.  Maintain normal blood lipids and cholesterol levels by exercising and minimizing your intake of saturated fat. Eat a balanced diet with plenty of fruit and vegetables. Blood tests for lipids and cholesterol should begin at age 20 and be repeated every 5 years. If your lipid or cholesterol levels are high, you are over 50, or you are at high risk for heart disease, you may need your cholesterol levels checked more frequently.Ongoing high lipid and cholesterol levels should be treated with medicines if diet and exercise are not working.  If you smoke, find out from your health care provider how to quit. If you do not use tobacco, do not start.  Lung cancer screening is recommended for adults aged 55-80 years who are at high risk for developing lung cancer because of a history of smoking. A yearly low-dose CT scan of the lungs is recommended for people who have at least a 30-pack-year history of smoking and are a current smoker or have quit within the past 15 years. A pack year of smoking is smoking an average of 1 pack of cigarettes a day for 1 year (for example: 1 pack a day for 30 years or 2 packs a day for 15 years). Yearly screening should continue until the smoker has stopped smoking for at least 15 years. Yearly screening should be stopped for people who develop a health problem that would prevent them from having lung cancer treatment.  If you choose to drink alcohol, do not have more   than 2 drinks per day. One drink is considered to be 12 ounces (355 mL) of beer, 5 ounces (148 mL) of wine, or 1.5 ounces (44 mL) of liquor.  Avoid use of street drugs. Do not share needles with anyone. Ask for help if you need support or instructions about stopping the use of drugs.  High blood pressure causes heart disease and increases the risk of stroke. Your blood pressure should be checked at least every 1-2 years. Ongoing high blood pressure should be treated with  medicines, if weight loss and exercise are not effective.  If you are 45-79 years old, ask your health care provider if you should take aspirin to prevent heart disease.  Diabetes screening involves taking a blood sample to check your fasting blood sugar level. This should be done once every 3 years, after age 45, if you are within normal weight and without risk factors for diabetes. Testing should be considered at a younger age or be carried out more frequently if you are overweight and have at least 1 risk factor for diabetes.  Colorectal cancer can be detected and often prevented. Most routine colorectal cancer screening begins at the age of 50 and continues through age 75. However, your health care provider may recommend screening at an earlier age if you have risk factors for colon cancer. On a yearly basis, your health care provider may provide home test kits to check for hidden blood in the stool. Use of a small camera at the end of a tube to directly examine the colon (sigmoidoscopy or colonoscopy) can detect the earliest forms of colorectal cancer. Talk to your health care provider about this at age 50, when routine screening begins. Direct exam of the colon should be repeated every 5-10 years through age 75, unless early forms of precancerous polyps or small growths are found.   Talk with your health care provider about prostate cancer screening.  Testicular cancer screening isrecommended for adult males. Screening includes self-exam, a health care provider exam, and other screening tests. Consult with your health care provider about any symptoms you have or any concerns you have about testicular cancer.  Use sunscreen. Apply sunscreen liberally and repeatedly throughout the day. You should seek shade when your shadow is shorter than you. Protect yourself by wearing long sleeves, pants, a wide-brimmed hat, and sunglasses year round, whenever you are outdoors.  Once a month, do a whole-body  skin exam, using a mirror to look at the skin on your back. Tell your health care provider about new moles, moles that have irregular borders, moles that are larger than a pencil eraser, or moles that have changed in shape or color.  Stay current with required vaccines (immunizations).  Influenza vaccine. All adults should be immunized every year.  Tetanus, diphtheria, and acellular pertussis (Td, Tdap) vaccine. An adult who has not previously received Tdap or who does not know his vaccine status should receive 1 dose of Tdap. This initial dose should be followed by tetanus and diphtheria toxoids (Td) booster doses every 10 years. Adults with an unknown or incomplete history of completing a 3-dose immunization series with Td-containing vaccines should begin or complete a primary immunization series including a Tdap dose. Adults should receive a Td booster every 10 years.  Varicella vaccine. An adult without evidence of immunity to varicella should receive 2 doses or a second dose if he has previously received 1 dose.  Human papillomavirus (HPV) vaccine. Males aged 13-21 years who have not   received the vaccine previously should receive the 3-dose series. Males aged 22-26 years may be immunized. Immunization is recommended through the age of 26 years for any male who has sex with males and did not get any or all doses earlier. Immunization is recommended for any person with an immunocompromised condition through the age of 26 years if he did not get any or all doses earlier. During the 3-dose series, the second dose should be obtained 4-8 weeks after the first dose. The third dose should be obtained 24 weeks after the first dose and 16 weeks after the second dose.  Zoster vaccine. One dose is recommended for adults aged 60 years or older unless certain conditions are present.    PREVNAR  - Pneumococcal 13-valent conjugate (PCV13) vaccine. When indicated, a person who is uncertain of his immunization  history and has no record of immunization should receive the PCV13 vaccine. An adult aged 19 years or older who has certain medical conditions and has not been previously immunized should receive 1 dose of PCV13 vaccine. This PCV13 should be followed with a dose of pneumococcal polysaccharide (PPSV23) vaccine. The PPSV23 vaccine dose should be obtained at least 8 weeks after the dose of PCV13 vaccine. An adult aged 19 years or older who has certain medical conditions and previously received 1 or more doses of PPSV23 vaccine should receive 1 dose of PCV13. The PCV13 vaccine dose should be obtained 1 or more years after the last PPSV23 vaccine dose.    PNEUMOVAX - Pneumococcal polysaccharide (PPSV23) vaccine. When PCV13 is also indicated, PCV13 should be obtained first. All adults aged 65 years and older should be immunized. An adult younger than age 65 years who has certain medical conditions should be immunized. Any person who resides in a nursing home or long-term care facility should be immunized. An adult smoker should be immunized. People with an immunocompromised condition and certain other conditions should receive both PCV13 and PPSV23 vaccines. People with human immunodeficiency virus (HIV) infection should be immunized as soon as possible after diagnosis. Immunization during chemotherapy or radiation therapy should be avoided. Routine use of PPSV23 vaccine is not recommended for American Indians, [place] Natives, or people younger than 65 years unless there are medical conditions that require PPSV23 vaccine. When indicated, people who have unknown immunization and have no record of immunization should receive PPSV23 vaccine. One-time revaccination 5 years after the first dose of PPSV23 is recommended for people aged 19-64 years who have chronic kidney failure, nephrotic syndrome, asplenia, or immunocompromised conditions. People who received 1-2 doses of PPSV23 before age 65 years should receive another  dose of PPSV23 vaccine at age 65 years or later if at least 5 years have passed since the previous dose. Doses of PPSV23 are not needed for people immunized with PPSV23 at or after age 65 years.    Hepatitis A vaccine. Adults who wish to be protected from this disease, have certain high-risk conditions, work with hepatitis A-infected animals, work in hepatitis A research labs, or travel to or work in countries with a high rate of hepatitis A should be immunized. Adults who were previously unvaccinated and who anticipate close contact with an international adoptee during the first 60 days after arrival in the [place] from a country with a high rate of hepatitis A should be immunized.    Hepatitis B vaccine. Adults should be immunized if they wish to be protected from this disease, have certain high-risk conditions, may be exposed to   blood or other infectious body fluids, are household contacts or sex partners of hepatitis B positive people, are clients or workers in certain care facilities, or travel to or work in countries with a high rate of hepatitis B.   Preventive Service / Frequency   Ages 40 to 64  Blood pressure check.  Lipid and cholesterol check  Lung cancer screening. / Every year if you are aged 55-80 years and have a 30-pack-year history of smoking and currently smoke or have quit within the past 15 years. Yearly screening is stopped once you have quit smoking for at least 15 years or develop a health problem that would prevent you from having lung cancer treatment.  Fecal occult blood test (FOBT) of stool. / Every year beginning at age 50 and continuing until age 75. You may not have to do this test if you get a colonoscopy every 10 years.  Flexible sigmoidoscopy** or colonoscopy.** / Every 5 years for a flexible sigmoidoscopy or every 10 years for a colonoscopy beginning at age 50 and continuing until age 75. Screening for abdominal aortic aneurysm (AAA)  by ultrasound is  recommended for people who have history of high blood pressure or who are current or former smokers.   

## 2014-03-24 LAB — BASIC METABOLIC PANEL WITH GFR
BUN: 16 mg/dL (ref 6–23)
CALCIUM: 9.2 mg/dL (ref 8.4–10.5)
CHLORIDE: 107 meq/L (ref 96–112)
CO2: 23 meq/L (ref 19–32)
CREATININE: 1.08 mg/dL (ref 0.50–1.35)
GFR, Est African American: >89 mL/min
GFR, Est Non African American: 78 mL/min
GLUCOSE: 85 mg/dL (ref 70–99)
Potassium: 3.9 mEq/L (ref 3.5–5.3)
Sodium: 140 mEq/L (ref 135–145)

## 2014-03-24 LAB — RHEUMATOID FACTOR: Rhuematoid fact SerPl-aCnc: <10 IU/mL (ref <=14)

## 2014-03-24 LAB — URIC ACID: URIC ACID, SERUM: 5.2 mg/dL (ref 4.0–7.8)

## 2014-03-24 LAB — CYCLIC CITRUL PEPTIDE ANTIBODY, IGG: Cyclic Citrullin Peptide Ab: <2 U/mL (ref 0.0–5.0)

## 2014-03-24 LAB — HEPATIC FUNCTION PANEL
ALT: 16 U/L (ref 0–53)
AST: 19 U/L (ref 0–37)
Albumin: 4.5 g/dL (ref 3.5–5.2)
Alkaline Phosphatase: 81 U/L (ref 39–117)
BILIRUBIN INDIRECT: 0.4 mg/dL (ref 0.2–1.2)
BILIRUBIN TOTAL: 0.5 mg/dL (ref 0.2–1.2)
Bilirubin, Direct: 0.1 mg/dL (ref 0.0–0.3)
TOTAL PROTEIN: 7 g/dL (ref 6.0–8.3)

## 2014-03-24 LAB — MAGNESIUM: Magnesium: 2 mg/dL (ref 1.5–2.5)

## 2014-03-24 LAB — LIPID PANEL
CHOL/HDL RATIO: 2.6 ratio
Cholesterol: 165 mg/dL (ref 0–200)
HDL: 63 mg/dL (ref >=40)
LDL Cholesterol: 90 mg/dL (ref 0–99)
Triglycerides: 61 mg/dL (ref <150)
VLDL: 12 mg/dL (ref 0–40)

## 2014-03-24 LAB — VITAMIN B12: VITAMIN B 12: 553 pg/mL (ref 211–911)

## 2014-03-24 LAB — ANTI-DNA ANTIBODY, DOUBLE-STRANDED: DS DNA AB: 1 [IU]/mL

## 2014-03-24 LAB — VITAMIN D 25 HYDROXY (VIT D DEFICIENCY, FRACTURES): Vit D, 25-Hydroxy: 30 ng/mL (ref 30–100)

## 2014-03-24 LAB — SEDIMENTATION RATE: Sed Rate: 1 mm/hr (ref 0–20)

## 2014-03-24 LAB — TESTOSTERONE: TESTOSTERONE: 442 ng/dL (ref 300–890)

## 2014-03-24 LAB — IRON AND TIBC
%SAT: 26 % (ref 20–55)
Iron: 85 ug/dL (ref 42–165)
TIBC: 328 ug/dL (ref 215–435)
UIBC: 243 ug/dL (ref 125–400)

## 2014-03-24 LAB — C3 AND C4
C3 Complement: 121 mg/dL (ref 90–180)
C4 Complement: 23 mg/dL (ref 10–40)

## 2014-03-24 LAB — TSH: TSH: 0.862 u[IU]/mL (ref 0.350–4.500)

## 2014-03-24 LAB — ANA: ANA: NEGATIVE

## 2014-03-24 LAB — INSULIN, FASTING: Insulin fasting, serum: 3.5 u[IU]/mL (ref 2.0–19.6)

## 2014-03-24 LAB — PSA: PSA: 1.23 ng/mL (ref <=4.00)

## 2014-03-24 LAB — ANGIOTENSIN CONVERTING ENZYME: Angiotensin-Converting Enzyme: 40 U/L (ref 8–52)

## 2014-03-24 LAB — C-REACTIVE PROTEIN

## 2014-03-26 LAB — COMPLEMENT, TOTAL

## 2014-03-30 NOTE — Addendum Note (Signed)
Addended by: Terri PiedraFORCUCCI, Krizia Flight A on: 03/30/2014 01:15 PM   Modules accepted: Kipp BroodSmartSet

## 2014-04-08 ENCOUNTER — Encounter: Payer: Self-pay | Admitting: Nurse Practitioner

## 2014-04-24 ENCOUNTER — Encounter: Payer: Self-pay | Admitting: Gastroenterology

## 2014-04-27 ENCOUNTER — Ambulatory Visit: Payer: Self-pay | Admitting: Nurse Practitioner

## 2014-06-22 ENCOUNTER — Ambulatory Visit: Payer: Self-pay | Admitting: Gastroenterology

## 2014-09-28 ENCOUNTER — Ambulatory Visit: Payer: Self-pay | Admitting: Internal Medicine

## 2015-01-20 ENCOUNTER — Emergency Department (INDEPENDENT_AMBULATORY_CARE_PROVIDER_SITE_OTHER)
Admission: EM | Admit: 2015-01-20 | Discharge: 2015-01-20 | Disposition: A | Discharge status: Home / Self Care | Payer: Managed Care, Other (non HMO) | Source: Home / Self Care

## 2015-01-20 ENCOUNTER — Emergency Department (INDEPENDENT_AMBULATORY_CARE_PROVIDER_SITE_OTHER): Payer: Managed Care, Other (non HMO)

## 2015-01-20 ENCOUNTER — Encounter (HOSPITAL_COMMUNITY): Payer: Self-pay | Admitting: *Deleted

## 2015-01-20 DIAGNOSIS — J111 Influenza due to unidentified influenza virus with other respiratory manifestations: Secondary | ICD-10-CM

## 2015-01-20 DIAGNOSIS — R69 Illness, unspecified: Principal | ICD-10-CM

## 2015-01-20 NOTE — Discharge Instructions (Signed)
Drink plenty of fluids as discussed, treat fever as needed, and mucinex or delsym for cough. Return or see your doctor if further problems °

## 2015-01-20 NOTE — ED Provider Notes (Signed)
CSN: [id number]     Arrival date & time 01/20/15  1819 History   None    Chief Complaint  Patient presents with  . URI   (Consider location/radiation/quality/duration/timing/severity/associated sxs/prior Treatment) Patient is a 55 y.o. male presenting with URI. The history is provided by the patient and the spouse.  URI Presenting symptoms: congestion, cough, fatigue, fever and rhinorrhea   Severity:  Moderate Onset quality:  Sudden Duration:  3 days Progression:  Worsening Chronicity:  New Relieved by:  None tried Worsened by:  Nothing tried Ineffective treatments:  None tried Associated symptoms: myalgias   Associated symptoms: no wheezing     Past Medical History  Diagnosis Date  . Arthritis   . GERD (gastroesophageal reflux disease)   . Diabetes mellitus   . Anal fissure   . Colon polyps 01/13/11    Colonoscopy   Past Surgical History  Procedure Laterality Date  . Colonoscopy    . Anal fissure repair  02/24/11   Family History  Problem Relation Age of Onset  . Allergies Daughter   . Allergies Sister   . Asthma Daughter   . Rheum arthritis Mother    Social History  Substance Use Topics  . Smoking status: Current Every Day Smoker -- 0.25 packs/day for 27 years    Types: Cigarettes  . Smokeless tobacco: Never Used     Comment: 7 ciggs a day  . Alcohol Use: Yes     Comment: socially    Review of Systems  Constitutional: Positive for fever, appetite change and fatigue.  HENT: Positive for congestion and rhinorrhea.   Respiratory: Positive for cough. Negative for shortness of breath and wheezing.   Cardiovascular: Negative.   Gastrointestinal: Negative.   Genitourinary: Negative.   Musculoskeletal: Positive for myalgias.  All other systems reviewed and are negative.   Allergies  Cialis; Dicyclomine; Meloxicam; and Relafen  Home Medications   Prior to Admission medications   Medication Sig Start Date End Date Taking? Authorizing Provider  albuterol  (PROVENTIL HFA;VENTOLIN HFA) 108 (90 BASE) MCG/ACT inhaler Inhale 2 puffs into the lungs every 6 (six) hours as needed for wheezing or shortness of breath.     Historical Provider, MD  azithromycin (ZITHROMAX) 250 MG tablet Take 2 tablets (500 mg) on  Day 1,  followed by 1 tablet (250 mg) once daily on Days 2 through 5. 03/23/14   Courtney Forcucci, PA-C  cholecalciferol (VITAMIN D) 1000 UNITS tablet Take 1,000 Units by mouth daily.    Historical Provider, MD  docusate sodium (COLACE) 100 MG capsule Take 100 mg by mouth 2 (two) times daily as needed. Constipation    Historical Provider, MD  glucose blood test strip Use as instructed 03/23/14   Terri Piedra, PA-C  omeprazole (PRILOSEC) 20 MG capsule Take 20 mg by mouth at bedtime.    Historical Provider, MD  predniSONE (DELTASONE) 20 MG tablet 3 tabs po day one, then 2 tabs daily x 4 days 03/23/14   Terri Piedra, PA-C  vitamin E 400 UNIT capsule Take 400 Units by mouth daily.    Historical Provider, MD   Meds Ordered and Administered this Visit  Medications - No data to display  BP 124/86 mmHg  Pulse 108  Temp(Src) 99.8 F (37.7 C) (Oral)  SpO2 97% No data found.   Physical Exam  Constitutional: He is oriented to person, place, and time. He appears well-developed and well-nourished. No distress.  HENT:  Right Ear: External ear normal.  Left Ear: External  ear normal.  Mouth/Throat: Oropharynx is clear and moist.  Eyes: Pupils are equal, round, and reactive to light.  Neck: Normal range of motion. Neck supple.  Cardiovascular: Regular rhythm and normal heart sounds.   Pulmonary/Chest: Effort normal and breath sounds normal.  Abdominal: Soft. Bowel sounds are normal.  Lymphadenopathy:    He has no cervical adenopathy.  Neurological: He is alert and oriented to person, place, and time.  Skin: Skin is warm and dry.  Nursing note and vitals reviewed.   ED Course  Procedures (including critical care time)  Labs Review Labs  Reviewed - No data to display  Imaging Review Dg Chest 2 View  01/20/2015  CLINICAL DATA:  55 year old male with low-grade fever, cough, body and headache. EXAM: CHEST  2 VIEW COMPARISON:  Chest x-ray 06/02/2011. FINDINGS: Lung volumes are normal. No consolidative airspace disease. No pleural effusions. No pneumothorax. No pulmonary nodule or mass noted. Pulmonary vasculature and the cardiomediastinal silhouette are within normal limits. IMPRESSION: No radiographic evidence of acute cardiopulmonary disease. Electronically Signed   By: Trudie Reed M.D.   On: 01/20/2015 19:42   X-rays reviewed and report per radiologist.   Visual Acuity Review  Right Eye Distance:   Left Eye Distance:   Bilateral Distance:    Right Eye Near:   Left Eye Near:    Bilateral Near:         MDM   1. Influenza-like illness        Linna Hoff, MD 01/20/15 [id number]

## 2015-01-20 NOTE — ED Notes (Signed)
Pt  Reports      Symptoms  Of chills          Body  Aches   Nasal  Congestion        Coughing   Symptoms  3-4  Days  Ago         Worse    Yesterday

## 2015-01-25 ENCOUNTER — Encounter (HOSPITAL_COMMUNITY): Payer: Self-pay | Admitting: Emergency Medicine

## 2015-01-25 ENCOUNTER — Emergency Department (INDEPENDENT_AMBULATORY_CARE_PROVIDER_SITE_OTHER)
Admission: EM | Admit: 2015-01-25 | Discharge: 2015-01-25 | Disposition: A | Discharge status: Home / Self Care | Payer: Managed Care, Other (non HMO) | Source: Home / Self Care | Attending: Family Medicine | Admitting: Family Medicine

## 2015-01-25 DIAGNOSIS — J069 Acute upper respiratory infection, unspecified: Secondary | ICD-10-CM

## 2015-01-25 DIAGNOSIS — J3489 Other specified disorders of nose and nasal sinuses: Secondary | ICD-10-CM

## 2015-01-25 NOTE — Discharge Instructions (Signed)
Upper Respiratory Infection, Adult For drainage recommended taking Allegra or Zyrtec daily. If needed make had Chlor-Trimeton 2-4 mg every 4 hours for excess drainage. Sudafed PE 10 mg every 4-6 hours as needed for congestion Use copious amounts of saline nasal spray frequently Drink plenty of fluids and stay well-hydrated. Rest and do not attempt extra activity such as playing ball. Tylenol or Advil as needed for discomfort Most upper respiratory infections (URIs) are a viral infection of the air passages leading to the lungs. A URI affects the nose, throat, and upper air passages. The most common type of URI is nasopharyngitis and is typically referred to as "the common cold." URIs run their course and usually go away on their own. Most of the time, a URI does not require medical attention, but sometimes a bacterial infection in the upper airways can follow a viral infection. This is called a secondary infection. Sinus and middle ear infections are common types of secondary upper respiratory infections. Bacterial pneumonia can also complicate a URI. A URI can worsen asthma and chronic obstructive pulmonary disease (COPD). Sometimes, these complications can require emergency medical care and may be life threatening.  CAUSES Almost all URIs are caused by viruses. A virus is a type of germ and can spread from one person to another.  RISKS FACTORS You may be at risk for a URI if:   You smoke.   You have chronic heart or lung disease.  You have a weakened defense (immune) system.   You are very young or very old.   You have nasal allergies or asthma.  You work in crowded or poorly ventilated areas.  You work in health care facilities or schools. SIGNS AND SYMPTOMS  Symptoms typically develop 2-3 days after you come in contact with a cold virus. Most viral URIs last 7-10 days. However, viral URIs from the influenza virus (flu virus) can last 14-18 days and are typically more severe.  Symptoms may include:   Runny or stuffy (congested) nose.   Sneezing.   Cough.   Sore throat.   Headache.   Fatigue.   Fever.   Loss of appetite.   Pain in your forehead, behind your eyes, and over your cheekbones (sinus pain).  Muscle aches.  DIAGNOSIS  Your health care provider may diagnose a URI by:  Physical exam.  Tests to check that your symptoms are not due to another condition such as:  Strep throat.  Sinusitis.  Pneumonia.  Asthma. TREATMENT  A URI goes away on its own with time. It cannot be cured with medicines, but medicines may be prescribed or recommended to relieve symptoms. Medicines may help:  Reduce your fever.  Reduce your cough.  Relieve nasal congestion. HOME CARE INSTRUCTIONS   Take medicines only as directed by your health care provider.   Gargle warm saltwater or take cough drops to comfort your throat as directed by your health care provider.  Use a warm mist humidifier or inhale steam from a shower to increase air moisture. This may make it easier to breathe.  Drink enough fluid to keep your urine clear or pale yellow.   Eat soups and other clear broths and maintain good nutrition.   Rest as needed.   Return to work when your temperature has returned to normal or as your health care provider advises. You may need to stay home longer to avoid infecting others. You can also use a face mask and careful hand washing to prevent spread of the  virus.  Increase the usage of your inhaler if you have asthma.   Do not use any tobacco products, including cigarettes, chewing tobacco, or electronic cigarettes. If you need help quitting, ask your health care provider. PREVENTION  The best way to protect yourself from getting a cold is to practice good hygiene.   Avoid oral or hand contact with people with cold symptoms.   Wash your hands often if contact occurs.  There is no clear evidence that vitamin C, vitamin E,  echinacea, or exercise reduces the chance of developing a cold. However, it is always recommended to get plenty of rest, exercise, and practice good nutrition.  SEEK MEDICAL CARE IF:   You are getting worse rather than better.   Your symptoms are not controlled by medicine.   You have chills.  You have worsening shortness of breath.  You have brown or red mucus.  You have yellow or brown nasal discharge.  You have pain in your face, especially when you bend forward.  You have a fever.  You have swollen neck glands.  You have pain while swallowing.  You have white areas in the back of your throat. SEEK IMMEDIATE MEDICAL CARE IF:   You have severe or persistent:  Headache.  Ear pain.  Sinus pain.  Chest pain.  You have chronic lung disease and any of the following:  Wheezing.  Prolonged cough.  Coughing up blood.  A change in your usual mucus.  You have a stiff neck.  You have changes in your:  Vision.  Hearing.  Thinking.  Mood. MAKE SURE YOU:   Understand these instructions.  Will watch your condition.  Will get help right away if you are not doing well or get worse.   This information is not intended to replace advice given to you by your health care provider. Make sure you discuss any questions you have with your health care provider.   Document Released: 06/14/2000 Document Revised: 05/05/2014 Document Reviewed: 03/26/2013 Elsevier Interactive Patient Education [employer]

## 2015-01-25 NOTE — ED Provider Notes (Signed)
CSN: [id number]     Arrival date & time 01/25/15  1423 History   First MD Initiated Contact with Patient 01/25/15 1702     Chief Complaint  Patient presents with  . URI   (Consider location/radiation/quality/duration/timing/severity/associated sxs/prior Treatment) HPI Comments: 55 year old male complaining of head and chest congestion, cough, occasionally feeling lightheaded and weak. Denies GI or GU symptoms. Denies fever, pain or sneezing.   Past Medical History  Diagnosis Date  . Arthritis   . GERD (gastroesophageal reflux disease)   . Diabetes mellitus   . Anal fissure   . Colon polyps 01/13/11    Colonoscopy   Past Surgical History  Procedure Laterality Date  . Colonoscopy    . Anal fissure repair  02/24/11   Family History  Problem Relation Age of Onset  . Allergies Daughter   . Allergies Sister   . Asthma Daughter   . Rheum arthritis Mother    Social History  Substance Use Topics  . Smoking status: Current Every Day Smoker -- 0.25 packs/day for 27 years    Types: Cigarettes  . Smokeless tobacco: Never Used     Comment: 7 ciggs a day  . Alcohol Use: Yes     Comment: socially    Review of Systems  Constitutional: Positive for activity change. Negative for fever, diaphoresis and fatigue.  HENT: Positive for congestion, postnasal drip and rhinorrhea. Negative for ear pain, facial swelling, sore throat and trouble swallowing.   Eyes: Negative for pain, discharge and redness.  Respiratory: Positive for cough. Negative for chest tightness and shortness of breath.   Cardiovascular: Negative.   Gastrointestinal: Negative.   Musculoskeletal: Negative.  Negative for neck pain and neck stiffness.  Neurological: Negative.     Allergies  Cialis; Dicyclomine; Meloxicam; and Relafen  Home Medications   Prior to Admission medications   Medication Sig Start Date End Date Taking? Authorizing Provider  albuterol (PROVENTIL HFA;VENTOLIN HFA) 108 (90 BASE) MCG/ACT inhaler  Inhale 2 puffs into the lungs every 6 (six) hours as needed for wheezing or shortness of breath.     Historical Provider, MD  azithromycin (ZITHROMAX) 250 MG tablet Take 2 tablets (500 mg) on  Day 1,  followed by 1 tablet (250 mg) once daily on Days 2 through 5. Patient not taking: Reported on 01/25/2015 03/23/14   Terri Piedra, PA-C  cholecalciferol (VITAMIN D) 1000 UNITS tablet Take 1,000 Units by mouth daily.    Historical Provider, MD  docusate sodium (COLACE) 100 MG capsule Take 100 mg by mouth 2 (two) times daily as needed. Constipation    Historical Provider, MD  glucose blood test strip Use as instructed 03/23/14   Terri Piedra, PA-C  omeprazole (PRILOSEC) 20 MG capsule Take 20 mg by mouth at bedtime.    Historical Provider, MD  predniSONE (DELTASONE) 20 MG tablet 3 tabs po day one, then 2 tabs daily x 4 days Patient not taking: Reported on 01/25/2015 03/23/14   Toni Amend Forcucci, PA-C  vitamin E 400 UNIT capsule Take 400 Units by mouth daily.    Historical Provider, MD   Meds Ordered and Administered this Visit  Medications - No data to display  BP 119/83 mmHg  Pulse 54  Temp(Src) 98 F (36.7 C) (Oral)  Resp 12  SpO2 97% No data found.   Physical Exam  Constitutional: He is oriented to person, place, and time. He appears well-developed and well-nourished. No distress.  HENT:  Mouth/Throat: No oropharyngeal exudate.  Bilateral TMs are normal oropharynx  with minor erythema, copious amount of clear PND and much cobblestoning. No exudates. No swelling.   Eyes: Conjunctivae and EOM are normal.  Neck: Normal range of motion. Neck supple.  Cardiovascular: Normal rate, regular rhythm and normal heart sounds.   Pulmonary/Chest: Effort normal and breath sounds normal. No respiratory distress. He has no wheezes. He has no rales.  Musculoskeletal: Normal range of motion. He exhibits no edema.  Lymphadenopathy:    He has no cervical adenopathy.  Neurological: He is alert and  oriented to person, place, and time.  Skin: Skin is warm and dry. No rash noted.  Psychiatric: He has a normal mood and affect.  Nursing note and vitals reviewed.   ED Course  Procedures (including critical care time)  Labs Review Labs Reviewed - No data to display  Imaging Review No results found.   Visual Acuity Review  Right Eye Distance:   Left Eye Distance:   Bilateral Distance:    Right Eye Near:   Left Eye Near:    Bilateral Near:         MDM   1. URI (upper respiratory infection)   2. Sinus drainage    Upper Respiratory Infection, Adult For drainage recommended taking Allegra or Zyrtec daily. If needed make had Chlor-Trimeton 2-4 mg every 4 hours for excess drainage. Sudafed PE 10 mg every 4-6 hours as needed for congestion Use copious amounts of saline nasal spray frequently Drink plenty of fluids and stay well-hydrated. Rest and do not attempt extra activity such as playing ball. Tylenol or Advil as needed for discomfort  Hayden Rasmussen, NP 01/25/15 1803

## 2015-01-25 NOTE — ED Notes (Signed)
Seen at [facility] 1/18 for flu symptoms.  Instructed to take mucinex, tylenol, and fluids. Patient here today for head congestion, runny nose, cough, and feeling lightheaded, green phlegm.  No nausea, no vomiting and no diarrhea.

## 2015-03-22 ENCOUNTER — Encounter (HOSPITAL_COMMUNITY): Payer: Self-pay | Admitting: Emergency Medicine

## 2015-03-22 ENCOUNTER — Emergency Department (HOSPITAL_COMMUNITY)
Admission: EM | Admit: 2015-03-22 | Discharge: 2015-03-22 | Disposition: A | Discharge status: Home / Self Care | Payer: Managed Care, Other (non HMO) | Attending: Emergency Medicine | Admitting: Emergency Medicine

## 2015-03-22 DIAGNOSIS — K219 Gastro-esophageal reflux disease without esophagitis: Secondary | ICD-10-CM | POA: Diagnosis not present

## 2015-03-22 DIAGNOSIS — E119 Type 2 diabetes mellitus without complications: Secondary | ICD-10-CM | POA: Insufficient documentation

## 2015-03-22 DIAGNOSIS — J111 Influenza due to unidentified influenza virus with other respiratory manifestations: Secondary | ICD-10-CM

## 2015-03-22 DIAGNOSIS — R6883 Chills (without fever): Secondary | ICD-10-CM | POA: Diagnosis present

## 2015-03-22 DIAGNOSIS — F17210 Nicotine dependence, cigarettes, uncomplicated: Secondary | ICD-10-CM | POA: Insufficient documentation

## 2015-03-22 DIAGNOSIS — Z86010 Personal history of colonic polyps: Secondary | ICD-10-CM | POA: Diagnosis not present

## 2015-03-22 DIAGNOSIS — Z8739 Personal history of other diseases of the musculoskeletal system and connective tissue: Secondary | ICD-10-CM | POA: Insufficient documentation

## 2015-03-22 DIAGNOSIS — R69 Illness, unspecified: Secondary | ICD-10-CM

## 2015-03-22 DIAGNOSIS — Z79899 Other long term (current) drug therapy: Secondary | ICD-10-CM | POA: Diagnosis not present

## 2015-03-22 MED ORDER — HYDROCODONE-HOMATROPINE 5-1.5 MG/5ML PO SYRP: 5.0000 mL | ORAL_SOLUTION | Freq: Every evening | ORAL | Status: DC | PRN

## 2015-03-22 MED ORDER — ACETAMINOPHEN 500 MG PO TABS
1000.0000 mg | ORAL_TABLET | Freq: Once | ORAL | Status: AC
  Administered 2015-03-22: 1000 mg via ORAL
  Filled 2015-03-22: qty 2

## 2015-03-22 MED ORDER — ALBUTEROL SULFATE HFA 108 (90 BASE) MCG/ACT IN AERS: 1.0000 | INHALATION_SPRAY | Freq: Four times a day (QID) | RESPIRATORY_TRACT | Status: DC | PRN

## 2015-03-22 NOTE — ED Provider Notes (Signed)
CSN: [id number]     Arrival date & time 03/22/15  [id number] History  By signing my name below, I, Emmanuella Mensah, attest that this documentation has been prepared under the direction and in the presence of Azlynn Mitnick, PA-C. Electronically Signed: Angelene GiovanniEmmanuella Mensah, ED Scribe. 03/22/2015. 11:05 AM.    Chief Complaint  Patient presents with  . Generalized Body Aches  . Chills   The history is provided by the patient. No language interpreter was used.   HPI Comments: Brett [name] is a 55 y.o. male with a hx of GERD and borderline DM who presents to the Emergency Department complaining of gradually worsening persistent cough onset 3 days ago. He reports associated intermittent fever with highest temp at 99, chills, sore throat, nasal congestion, generalized body aches, and fatigue. He states that he has tried Tylenol, Catering managerAlka Seltzer and increase fluids with no relief. He reports that he is an occasional smoker. Pt has not received his flu vaccine this season. He reports sick contacts at work with similar symptoms. He reports an allergy to meloxicam. He denies any abdominal pain, n/v/d, SOB, or HA.    Past Medical History  Diagnosis Date  . Arthritis   . GERD (gastroesophageal reflux disease)   . Diabetes mellitus   . Anal fissure   . Colon polyps 01/13/11    Colonoscopy   Past Surgical History  Procedure Laterality Date  . Colonoscopy    . Anal fissure repair  02/24/11   Family History  Problem Relation Age of Onset  . Allergies Daughter   . Allergies Sister   . Asthma Daughter   . Rheum arthritis Mother    Social History  Substance Use Topics  . Smoking status: Current Every Day Smoker -- 0.25 packs/day for 27 years    Types: Cigarettes  . Smokeless tobacco: Never Used     Comment: 7 ciggs a day  . Alcohol Use: Yes     Comment: socially    Review of Systems  Constitutional: Positive for fever, chills and fatigue.  HENT: Positive for congestion and sore throat.   Respiratory:  Positive for cough.   Gastrointestinal: Negative for nausea, vomiting, abdominal pain and diarrhea.  Musculoskeletal: Positive for myalgias.  Skin: Negative for rash.  Neurological: Negative for headaches.      Allergies  Cialis; Dicyclomine; Meloxicam; and Relafen  Home Medications   Prior to Admission medications   Medication Sig Start Date End Date Taking? Authorizing Provider  albuterol (PROVENTIL HFA;VENTOLIN HFA) 108 (90 BASE) MCG/ACT inhaler Inhale 2 puffs into the lungs every 6 (six) hours as needed for wheezing or shortness of breath.     Historical Provider, MD  azithromycin (ZITHROMAX) 250 MG tablet Take 2 tablets (500 mg) on  Day 1,  followed by 1 tablet (250 mg) once daily on Days 2 through 5. Patient not taking: Reported on 01/25/2015 03/23/14   Terri Piedraourtney Forcucci, PA-C  cholecalciferol (VITAMIN D) 1000 UNITS tablet Take 1,000 Units by mouth daily.    Historical Provider, MD  docusate sodium (COLACE) 100 MG capsule Take 100 mg by mouth 2 (two) times daily as needed. Constipation    Historical Provider, MD  glucose blood test strip Use as instructed 03/23/14   Terri Piedraourtney Forcucci, PA-C  omeprazole (PRILOSEC) 20 MG capsule Take 20 mg by mouth at bedtime.    Historical Provider, MD  predniSONE (DELTASONE) 20 MG tablet 3 tabs po day one, then 2 tabs daily x 4 days Patient not taking: Reported on 01/25/2015  03/23/14   Courtney Forcucci, PA-C  vitamin E 400 UNIT capsule Take 400 Units by mouth daily.    Historical Provider, MD   BP 120/88 mmHg  Pulse 91  Temp(Src) 99.5 F (37.5 C) (Oral)  Resp 18  Ht  (1.651 m)  Wt 165 lb (74.844 kg)  BMI 27.46 kg/m2  SpO2 98% Physical Exam  Constitutional: He appears well-developed and well-nourished. No distress.  HENT:  Head: Normocephalic and atraumatic.  Nose: Mucosal edema present. Right sinus exhibits no maxillary sinus tenderness and no frontal sinus tenderness. Left sinus exhibits no maxillary sinus tenderness and no frontal  sinus tenderness.  Mouth/Throat: Posterior oropharyngeal erythema present. No oropharyngeal exudate or posterior oropharyngeal edema.  Oropharynx erythema, no exudates.  Mucosal edema and erythema. No sinus tenderness  Eyes: Conjunctivae and EOM are normal. Right eye exhibits no discharge. Left eye exhibits no discharge.  Neck: Normal range of motion. Neck supple.  No nuchal rigidity   Cardiovascular: Normal rate and regular rhythm.   Pulmonary/Chest: Effort normal and breath sounds normal. No stridor. No respiratory distress. He has no wheezes. He has no rales.  Lymphadenopathy:    He has no cervical adenopathy.  Neurological: He is alert.  Skin: He is not diaphoretic.  Nursing note and vitals reviewed.   ED Course  Procedures (including critical care time) DIAGNOSTIC STUDIES: Oxygen Saturation is 98% on RA, normal by my interpretation.    COORDINATION OF CARE: 11:00 AM- Pt advised of plan for treatment and pt agrees. Pt has received Tylenol. He will receive albuterol and hycodan.   MDM   Final diagnoses:  Influenza-like illness    Afebrile, nontoxic patient with constellation of symptoms suggestive of viral syndrome.  No concerning findings on exam.  Discharged home with supportive care, PCP follow up.  Discussed result, findings, treatment, and follow up  with patient.  Pt given return precautions.  Pt verbalizes understanding and agrees with plan.       I personally performed the services described in this documentation, which was scribed in my presence. The recorded information has been reviewed and is accurate.   Trixie Dredge, PA-C 03/22/15 1313  Melene Plan, DO 03/22/15 [phone]

## 2015-03-22 NOTE — ED Notes (Signed)
Declined W/C at D/C and was escorted to lobby by RN. 

## 2015-03-22 NOTE — Discharge Instructions (Signed)
Read the information below.  Use the prescribed medication as directed.  Please discuss all new medications with your pharmacist.  You may return to the Emergency Department at any time for worsening condition or any new symptoms that concern you.   If you develop high fevers that do not resolve with tylenol or ibuprofen, you have difficulty swallowing or breathing, or you are unable to tolerate fluids by mouth, return to the ER for a recheck.    ° ° °Influenza, Adult °Influenza (flu) is an infection in the mouth, nose, and throat (respiratory tract) caused by a virus. The flu can make you feel very ill. Influenza spreads easily from person to person (contagious).  °HOME CARE  °· Only take medicines as told by your doctor. °· Use a cool mist humidifier to make breathing easier. °· Get plenty of rest until your fever goes away. This usually takes 3 to 4 days. °· Drink enough fluids to keep your pee (urine) clear or pale yellow. °· Cover your mouth and nose when you cough or sneeze. °· Wash your hands well to avoid spreading the flu. °· Stay home from work or school until your fever has been gone for at least 1 full day. °· Get a flu shot every year. °GET HELP RIGHT AWAY IF:  °· You have trouble breathing or feel short of breath. °· Your skin or nails turn blue. °· You have severe neck pain or stiffness. °· You have a severe headache, facial pain, or earache. °· Your fever gets worse or keeps coming back. °· You feel sick to your stomach (nauseous), throw up (vomit), or have watery poop (diarrhea). °· You have chest pain. °· You have a deep cough that gets worse, or you cough up more thick spit (mucus). °MAKE SURE YOU:  °· Understand these instructions. °· Will watch your condition. °· Will get help right away if you are not doing well or get worse. °  °This information is not intended to replace advice given to you by your health care provider. Make sure you discuss any questions you have with your health care  provider. °  °Document Released: 09/28/2007 Document Revised: 01/09/2014 Document Reviewed: 03/20/2011 °Elsevier Interactive Patient Education ©2016 [employer] ° °

## 2015-03-22 NOTE — ED Notes (Signed)
Pt c/o body aches, chills and congestion onset Friday. Pt tried over the counter medication with some relief.

## 2016-01-10 ENCOUNTER — Emergency Department (HOSPITAL_COMMUNITY)
Admission: EM | Admit: 2016-01-10 | Discharge: 2016-01-10 | Disposition: A | Discharge status: Home / Self Care | Payer: Managed Care, Other (non HMO)

## 2016-01-11 ENCOUNTER — Encounter (HOSPITAL_COMMUNITY): Payer: Self-pay | Admitting: Emergency Medicine

## 2016-01-11 ENCOUNTER — Emergency Department (HOSPITAL_COMMUNITY)
Admission: EM | Admit: 2016-01-11 | Discharge: 2016-01-11 | Disposition: A | Discharge status: Home / Self Care | Payer: BLUE CROSS/BLUE SHIELD | Attending: Emergency Medicine | Admitting: Emergency Medicine

## 2016-01-11 ENCOUNTER — Emergency Department (HOSPITAL_COMMUNITY): Payer: BLUE CROSS/BLUE SHIELD

## 2016-01-11 DIAGNOSIS — F17210 Nicotine dependence, cigarettes, uncomplicated: Secondary | ICD-10-CM | POA: Diagnosis not present

## 2016-01-11 DIAGNOSIS — J209 Acute bronchitis, unspecified: Secondary | ICD-10-CM | POA: Diagnosis not present

## 2016-01-11 DIAGNOSIS — E119 Type 2 diabetes mellitus without complications: Secondary | ICD-10-CM | POA: Insufficient documentation

## 2016-01-11 DIAGNOSIS — R05 Cough: Secondary | ICD-10-CM | POA: Diagnosis present

## 2016-01-11 DIAGNOSIS — J069 Acute upper respiratory infection, unspecified: Secondary | ICD-10-CM

## 2016-01-11 LAB — RAPID STREP SCREEN (MED CTR MEBANE ONLY): Streptococcus, Group A Screen (Direct): NEGATIVE

## 2016-01-11 MED ORDER — BENZONATATE 100 MG PO CAPS: 100.0000 mg | ORAL_CAPSULE | Freq: Three times a day (TID) | ORAL | 0 refills | Status: DC

## 2016-01-11 MED ORDER — ALBUTEROL SULFATE HFA 108 (90 BASE) MCG/ACT IN AERS: 1.0000 | INHALATION_SPRAY | Freq: Four times a day (QID) | RESPIRATORY_TRACT | 0 refills | Status: DC | PRN

## 2016-01-11 MED ORDER — AZITHROMYCIN 250 MG PO TABS: 250.0000 mg | ORAL_TABLET | Freq: Every day | ORAL | 0 refills | Status: DC

## 2016-01-11 NOTE — ED Provider Notes (Signed)
[facility]-EMERGENCY DEPT Provider Note   CSN: [id number] Arrival date & time: 01/11/16  0930  By signing my name below, I, Sonum Patel, attest that this documentation has been prepared under the direction and in the presence of Ponciano Ort [place], [place]. Electronically Signed: Sonum Patel, Neurosurgeon. 01/11/16. 11:12 AM.  History   Chief Complaint Chief Complaint  Patient presents with  . Cough  . Nasal Congestion    The history is provided by the patient. No language interpreter was used.    HPI Comments: Brett [name] is a 56 y.o. male who presents to the Emergency Department complaining of a persistent cough that has been ongoing for the past 3 weeks. He has associated sore throat and sinus congestion that began over the last few days. He denies sick contacts. He is daily cigarette smoker. He denies fever. He denies any modifying factors.  Past Medical History:  Diagnosis Date  . Anal fissure   . Arthritis   . Colon polyps 01/13/11   Colonoscopy  . Diabetes mellitus   . GERD (gastroesophageal reflux disease)     Patient Active Problem List   Diagnosis Date Noted  . Medication management 03/23/2014  . Vitamin D deficiency 03/23/2014  . Prediabetes 03/23/2014  . FOLLICULITIS 11/05/2006  . Hyperlipemia 10/31/2006  . GERD 10/31/2006    Past Surgical History:  Procedure Laterality Date  . ANAL FISSURE REPAIR  02/24/11  . COLONOSCOPY         Home Medications    Prior to Admission medications   Medication Sig Start Date End Date Taking? Authorizing Provider  albuterol (PROVENTIL HFA;VENTOLIN HFA) 108 (90 Base) MCG/ACT inhaler Inhale 1-2 puffs into the lungs every 6 (six) hours as needed for wheezing or shortness of breath. 03/22/15   Trixie Dredge, PA-C  azithromycin (ZITHROMAX) 250 MG tablet Take 2 tablets (500 mg) on  Day 1,  followed by 1 tablet (250 mg) once daily on Days 2 through 5. Patient not taking: Reported on 01/25/2015 03/23/14   Terri Piedra, PA-C    cholecalciferol (VITAMIN D) 1000 UNITS tablet Take 1,000 Units by mouth daily.    Historical Provider, MD  docusate sodium (COLACE) 100 MG capsule Take 100 mg by mouth 2 (two) times daily as needed. Constipation    Historical Provider, MD  glucose blood test strip Use as instructed 03/23/14   Courtney Forcucci, PA-C  HYDROcodone-homatropine (HYCODAN) 5-1.5 MG/5ML syrup Take 5 mLs by mouth at bedtime as needed for cough. 03/22/15   Trixie Dredge, PA-C  omeprazole (PRILOSEC) 20 MG capsule Take 20 mg by mouth at bedtime.    Historical Provider, MD  predniSONE (DELTASONE) 20 MG tablet 3 tabs po day one, then 2 tabs daily x 4 days Patient not taking: Reported on 01/25/2015 03/23/14   Toni Amend Forcucci, PA-C  vitamin E 400 UNIT capsule Take 400 Units by mouth daily.    Historical Provider, MD    Family History Family History  Problem Relation Age of Onset  . Rheum arthritis Mother   . Allergies Daughter   . Allergies Sister   . Asthma Daughter     Social History Social History  Substance Use Topics  . Smoking status: Current Every Day Smoker    Packs/day: 0.25    Years: 27.00    Types: Cigarettes  . Smokeless tobacco: Never Used     Comment: 7 ciggs a day  . Alcohol use Yes     Comment: socially     Allergies   Cialis [tadalafil];  Dicyclomine; Meloxicam; and Relafen [nabumetone]   Review of Systems Review of Systems  Constitutional: Negative for fever.  HENT: Positive for congestion and sore throat.   Respiratory: Positive for cough.   All other systems reviewed and are negative.    Physical Exam Updated Vital Signs BP 124/83 (BP Location: Right Arm)   Pulse 60   Temp 97.8 F (36.6 C) (Oral)   Resp 18   Wt 160 lb (72.6 kg)   SpO2 100%   BMI 26.63 kg/m   Physical Exam  Constitutional: He is oriented to person, place, and time. He appears well-developed and well-nourished.  HENT:  Head: Normocephalic and atraumatic.  Mouth/Throat: Posterior oropharyngeal erythema  present.  Cardiovascular: Normal rate.   Pulmonary/Chest: Effort normal.  Harsh breath sounds  Neurological: He is alert and oriented to person, place, and time.  Skin: Skin is warm and dry.  Psychiatric: He has a normal mood and affect.  Nursing note and vitals reviewed.    ED Treatments / Results  DIAGNOSTIC STUDIES: Oxygen Saturation is 100% on RA, normal by my interpretation.    COORDINATION OF CARE: 11:08 AM Discussed treatment plan with pt at bedside and pt agreed to plan.    Labs (all labs ordered are listed, but only abnormal results are displayed) Labs Reviewed  RAPID STRE[facility] SCREEN (NOT [facility])  CULTURE, GROUP A STREP [facility])    EKG  EKG Interpretation None       Radiology No results found.  Procedures Procedures (including critical care time)  Medications Ordered in ED Medications - No data to display   Initial Impression / Assessment and Plan / ED Course  I have reviewed the triage vital signs and the nursing notes.  Pertinent labs & imaging results that were available during my care of the patient were reviewed by me and considered in my medical decision making (see chart for details).  Clinical Course       Final Clinical Impressions(s) / ED Diagnoses   Final diagnoses:  Acute bronchitis, unspecified organism    New Prescriptions Discharge Medication List as of 01/11/2016 11:56 AM    START taking these medications   Details  benzonatate (TESSALON) 100 MG capsule Take 1 capsule (100 mg total) by mouth every 8 (eight) hours., Starting Tue 01/11/2016, Print       An After Visit Summary was printed and given to the patient.  I personally performed the services in this documentation, which was scribed in my presence.  The recorded information has been reviewed and considered.   Barnet PallKaren SofiaPAC.   Lonia SkinnerLeslie K ChattahoocheeSofia, PA-C 01/11/16 1829    Lorre NickAnthony Allen, MD 01/14/16 1041

## 2016-01-11 NOTE — ED Triage Notes (Signed)
Pt reports a three week hx of productive cough. New onset sore throat and sinus pressure. Denies fever

## 2016-01-13 LAB — CULTURE, GROUP A STREP (THRC)

## 2016-06-18 ENCOUNTER — Telehealth (HOSPITAL_COMMUNITY): Payer: Self-pay | Admitting: Emergency Medicine

## 2016-06-18 ENCOUNTER — Ambulatory Visit (HOSPITAL_COMMUNITY)
Admission: EM | Admit: 2016-06-18 | Discharge: 2016-06-18 | Disposition: A | Discharge status: Home / Self Care | Payer: BLUE CROSS/BLUE SHIELD | Attending: Internal Medicine | Admitting: Internal Medicine

## 2016-06-18 ENCOUNTER — Encounter (HOSPITAL_COMMUNITY): Payer: Self-pay | Admitting: *Deleted

## 2016-06-18 DIAGNOSIS — J209 Acute bronchitis, unspecified: Secondary | ICD-10-CM | POA: Diagnosis not present

## 2016-06-18 DIAGNOSIS — R059 Cough, unspecified: Secondary | ICD-10-CM

## 2016-06-18 DIAGNOSIS — R05 Cough: Secondary | ICD-10-CM

## 2016-06-18 MED ORDER — ALBUTEROL SULFATE HFA 108 (90 BASE) MCG/ACT IN AERS: 1.0000 | INHALATION_SPRAY | Freq: Four times a day (QID) | RESPIRATORY_TRACT | 0 refills | Status: DC | PRN

## 2016-06-18 MED ORDER — METHYLPREDNISOLONE 4 MG PO TBPK: ORAL_TABLET | ORAL | 0 refills | Status: DC

## 2016-06-18 MED ORDER — AZITHROMYCIN 250 MG PO TABS: 250.0000 mg | ORAL_TABLET | Freq: Every day | ORAL | 0 refills | Status: DC

## 2016-06-18 MED ORDER — BENZONATATE 100 MG PO CAPS: 200.0000 mg | ORAL_CAPSULE | Freq: Three times a day (TID) | ORAL | 0 refills | Status: DC | PRN

## 2016-06-18 NOTE — Telephone Encounter (Signed)
Patient called and stated that the prescription for albuterol MDI was not received by the pharmacy.

## 2016-06-18 NOTE — ED Triage Notes (Signed)
Patient reports wheezing, sob, congestion x 1 week. No fevers. Reports OTC meds not helping. Reports productive cough.

## 2016-06-20 NOTE — ED Provider Notes (Signed)
CSN: [id number]     Arrival date & time 06/18/16  1603 History   None    Chief Complaint  Patient presents with  . Shortness of Breath  . Nasal Congestion   (Consider location/radiation/quality/duration/timing/severity/associated sxs/prior Treatment) Patient c/o wheezing chest congestion and sob x 1 week.   The history is provided by the patient.  Shortness of Breath  Severity:  Moderate Onset quality:  Sudden Duration:  3 days Timing:  Constant Chronicity:  New Relieved by:  Nothing Worsened by:  Nothing Ineffective treatments:  None tried Associated symptoms: wheezing     Past Medical History:  Diagnosis Date  . Anal fissure   . Arthritis   . Colon polyps 01/13/11   Colonoscopy  . Diabetes mellitus   . GERD (gastroesophageal reflux disease)    Past Surgical History:  Procedure Laterality Date  . ANAL FISSURE REPAIR  02/24/11  . COLONOSCOPY     Family History  Problem Relation Age of Onset  . Rheum arthritis Mother   . Allergies Daughter   . Allergies Sister   . Asthma Daughter    Social History  Substance Use Topics  . Smoking status: Current Every Day Smoker    Packs/day: 0.25    Years: 27.00    Types: Cigarettes  . Smokeless tobacco: Never Used     Comment: 7 ciggs a day  . Alcohol use Yes     Comment: socially    Review of Systems  Constitutional: Negative.   HENT: Negative.   Eyes: Negative.   Respiratory: Positive for shortness of breath and wheezing.   Cardiovascular: Negative.   Gastrointestinal: Negative.   Endocrine: Negative.   Genitourinary: Negative.   Musculoskeletal: Negative.   Allergic/Immunologic: Negative.   Neurological: Negative.   Hematological: Negative.   Psychiatric/Behavioral: Negative.     Allergies  Cialis [tadalafil]; Dicyclomine; Meloxicam; and Relafen [nabumetone]  Home Medications   Prior to Admission medications   Medication Sig Start Date End Date Taking? Authorizing Provider  albuterol (PROVENTIL  HFA;VENTOLIN HFA) 108 (90 Base) MCG/ACT inhaler Inhale 1-2 puffs into the lungs every 6 (six) hours as needed for wheezing or shortness of breath. 01/11/16   Elson AreasSofia, Leslie K, PA-C  albuterol (PROVENTIL HFA;VENTOLIN HFA) 108 (90 Base) MCG/ACT inhaler Inhale 1-2 puffs into the lungs every 6 (six) hours as needed for wheezing or shortness of breath. 06/18/16   Deatra Canterxford, William J, FNP  azithromycin (ZITHROMAX) 250 MG tablet Take 1 tablet (250 mg total) by mouth daily. Take first 2 tablets together, then 1 every day until finished. 01/11/16   Elson AreasSofia, Leslie K, PA-C  azithromycin (ZITHROMAX) 250 MG tablet Take 1 tablet (250 mg total) by mouth daily. Take first 2 tablets together, then 1 every day until finished. 06/18/16   Deatra Canterxford, William J, FNP  benzonatate (TESSALON) 100 MG capsule Take 1 capsule (100 mg total) by mouth every 8 (eight) hours. 01/11/16   Elson AreasSofia, Leslie K, PA-C  benzonatate (TESSALON) 100 MG capsule Take 2 capsules (200 mg total) by mouth 3 (three) times daily as needed for cough. 06/18/16   Deatra Canterxford, William J, FNP  cholecalciferol (VITAMIN D) 1000 UNITS tablet Take 1,000 Units by mouth daily.    [provider]  docusate sodium (COLACE) 100 MG capsule Take 100 mg by mouth 2 (two) times daily as needed. Constipation    [provider]  glucose blood test strip Use as instructed 03/23/14   Forcucci, Courtney, PA-C  HYDROcodone-homatropine (HYCODAN) 5-1.5 MG/5ML syrup Take 5 mLs  by mouth at bedtime as needed for cough. 03/22/15   Trixie Dredge, PA-C  methylPREDNISolone (MEDROL DOSEPAK) 4 MG TBPK tablet Take 6-5-4-3-2-1 po qd 06/18/16   Deatra Canter, FNP  omeprazole (PRILOSEC) 20 MG capsule Take 20 mg by mouth at bedtime.    [provider]  predniSONE (DELTASONE) 20 MG tablet 3 tabs po day one, then 2 tabs daily x 4 days Patient not taking: Reported on 01/25/2015 03/23/14   Terri Piedra, PA-C  vitamin E 400 UNIT capsule Take 400 Units by mouth daily.    [provider]   Meds Ordered and Administered this Visit  Medications - No data to display  BP 133/85 (BP Location: Right Arm)   Pulse 65   Temp 97.9 F (36.6 C) (Oral)   Resp 19   SpO2 97%  No data found.   Physical Exam  Constitutional: He appears well-developed and well-nourished.  HENT:  Head: Normocephalic and atraumatic.  Right Ear: External ear normal.  Left Ear: External ear normal.  Mouth/Throat: Oropharynx is clear and moist.  Eyes: Conjunctivae and EOM are normal. Pupils are equal, round, and reactive to light.  Neck: Normal range of motion. Neck supple.  Cardiovascular: Normal rate, regular rhythm and normal heart sounds.   Pulmonary/Chest: Effort normal. He has wheezes.  Abdominal: Soft. Bowel sounds are normal.  Nursing note and vitals reviewed.   Urgent Care Course     Procedures (including critical care time)  Labs Review Labs Reviewed - No data to display  Imaging Review No results found.   Visual Acuity Review  Right Eye Distance:   Left Eye Distance:   Bilateral Distance:    Right Eye Near:   Left Eye Near:    Bilateral Near:         MDM   1. Acute bronchitis, unspecified organism   2. Cough    Albuterol MDI zpak Medrol dose pack as directed Tessalon Perles 200mg  one po tid prn   Push po fluids, rest, tylenol and motrin otc prn as directed for fever, arthralgias, and myalgias.  Follow up prn if sx's continue or persist.    Deatra Canter, FNP 06/20/16 1557

## 2016-06-29 ENCOUNTER — Encounter (HOSPITAL_COMMUNITY): Payer: Self-pay | Admitting: Emergency Medicine

## 2016-06-29 ENCOUNTER — Ambulatory Visit (HOSPITAL_COMMUNITY)
Admission: EM | Admit: 2016-06-29 | Discharge: 2016-06-29 | Disposition: A | Discharge status: Home / Self Care | Payer: BLUE CROSS/BLUE SHIELD | Attending: Internal Medicine | Admitting: Internal Medicine

## 2016-06-29 DIAGNOSIS — R05 Cough: Secondary | ICD-10-CM

## 2016-06-29 DIAGNOSIS — R5383 Other fatigue: Secondary | ICD-10-CM

## 2016-06-29 DIAGNOSIS — J40 Bronchitis, not specified as acute or chronic: Secondary | ICD-10-CM

## 2016-06-29 DIAGNOSIS — R059 Cough, unspecified: Secondary | ICD-10-CM

## 2016-06-29 MED ORDER — ALBUTEROL SULFATE HFA 108 (90 BASE) MCG/ACT IN AERS: 1.0000 | INHALATION_SPRAY | Freq: Four times a day (QID) | RESPIRATORY_TRACT | 1 refills | Status: DC | PRN

## 2016-06-29 MED ORDER — ALBUTEROL SULFATE (2.5 MG/3ML) 0.083% IN NEBU
2.5000 mg | INHALATION_SOLUTION | Freq: Once | RESPIRATORY_TRACT | Status: AC
  Administered 2016-06-29: 2.5 mg via RESPIRATORY_TRACT

## 2016-06-29 MED ORDER — AMOXICILLIN-POT CLAVULANATE 875-125 MG PO TABS: 1.0000 | ORAL_TABLET | Freq: Two times a day (BID) | ORAL | 0 refills | Status: DC

## 2016-06-29 MED ORDER — ALBUTEROL SULFATE (2.5 MG/3ML) 0.083% IN NEBU
INHALATION_SOLUTION | RESPIRATORY_TRACT | Status: AC
  Filled 2016-06-29: qty 3

## 2016-06-29 MED ORDER — PREDNISONE 10 MG PO TABS: ORAL_TABLET | ORAL | 0 refills | Status: DC

## 2016-06-29 NOTE — ED Triage Notes (Signed)
Pt here for persistent cold sx   Sts he was seen here on 6/17 for similar sx... Was given z-pack, albuterol, prednisone, tessalon... Sts as soon as he finished his meds, sx flared up again  Sx today include: wheezing, prod cough, nasal congestion/drainage  A&O x4... NAD... Ambulatory

## 2016-06-29 NOTE — ED Provider Notes (Signed)
CSN: [id number]     Arrival date & time 06/29/16  1935 History   None    Chief Complaint  Patient presents with  . URI   (Consider location/radiation/quality/duration/timing/severity/associated sxs/prior Treatment) Patient was seen over a week ago and states he was good for about a few days after finishing meds and then his breathing flared up again.  He is a long time smoker and states he gets SOB and wheezes at night.  He states he gets bad bronchitis once a year.  He is trying to quit smoking.   The history is provided by the patient.  URI  Presenting symptoms: congestion, cough, fatigue and fever   Severity:  Moderate Onset quality:  Sudden Duration:  1 week Timing:  Constant Progression:  Worsening Chronicity:  New Relieved by:  None tried Worsened by:  Nothing   Past Medical History:  Diagnosis Date  . Anal fissure   . Arthritis   . Colon polyps 01/13/11   Colonoscopy  . Diabetes mellitus   . GERD (gastroesophageal reflux disease)    Past Surgical History:  Procedure Laterality Date  . ANAL FISSURE REPAIR  02/24/11  . COLONOSCOPY     Family History  Problem Relation Age of Onset  . Rheum arthritis Mother   . Allergies Daughter   . Allergies Sister   . Asthma Daughter    Social History  Substance Use Topics  . Smoking status: Current Every Day Smoker    Packs/day: 0.25    Years: 27.00    Types: Cigarettes  . Smokeless tobacco: Never Used     Comment: 7 ciggs a day  . Alcohol use Yes     Comment: socially    Review of Systems  Constitutional: Positive for fatigue and fever.  HENT: Positive for congestion.   Eyes: Negative.   Respiratory: Positive for cough.   Cardiovascular: Negative.   Gastrointestinal: Negative.   Endocrine: Negative.   Genitourinary: Negative.   Musculoskeletal: Negative.   Allergic/Immunologic: Negative.   Neurological: Negative.   Hematological: Negative.   Psychiatric/Behavioral: Negative.     Allergies  Cialis  [tadalafil]; Dicyclomine; Meloxicam; and Relafen [nabumetone]  Home Medications   Prior to Admission medications   Medication Sig Start Date End Date Taking? Authorizing Provider  albuterol (PROVENTIL HFA;VENTOLIN HFA) 108 (90 Base) MCG/ACT inhaler Inhale 1-2 puffs into the lungs every 6 (six) hours as needed for wheezing or shortness of breath. 06/18/16   Deatra Canterxford, William J, FNP  albuterol (PROVENTIL HFA;VENTOLIN HFA) 108 (90 Base) MCG/ACT inhaler Inhale 1-2 puffs into the lungs every 6 (six) hours as needed for wheezing or shortness of breath. 06/29/16   Deatra Canterxford, William J, FNP  amoxicillin-clavulanate (AUGMENTIN) 875-125 MG tablet Take 1 tablet by mouth 2 (two) times daily. 06/29/16   Deatra Canterxford, William J, FNP  azithromycin (ZITHROMAX) 250 MG tablet Take 1 tablet (250 mg total) by mouth daily. Take first 2 tablets together, then 1 every day until finished. 01/11/16   Elson AreasSofia, Leslie K, PA-C  azithromycin (ZITHROMAX) 250 MG tablet Take 1 tablet (250 mg total) by mouth daily. Take first 2 tablets together, then 1 every day until finished. 06/18/16   Deatra Canterxford, William J, FNP  benzonatate (TESSALON) 100 MG capsule Take 1 capsule (100 mg total) by mouth every 8 (eight) hours. 01/11/16   Elson AreasSofia, Leslie K, PA-C  benzonatate (TESSALON) 100 MG capsule Take 2 capsules (200 mg total) by mouth 3 (three) times daily as needed for cough. 06/18/16   Deatra Canterxford, William J,  FNP  cholecalciferol (VITAMIN D) 1000 UNITS tablet Take 1,000 Units by mouth daily.    [provider]  docusate sodium (COLACE) 100 MG capsule Take 100 mg by mouth 2 (two) times daily as needed. Constipation    [provider]  glucose blood test strip Use as instructed 03/23/14   Forcucci, Courtney, PA-C  HYDROcodone-homatropine (HYCODAN) 5-1.5 MG/5ML syrup Take 5 mLs by mouth at bedtime as needed for cough. 03/22/15   Trixie Dredge, PA-C  methylPREDNISolone (MEDROL DOSEPAK) 4 MG TBPK tablet Take 6-5-4-3-2-1 po qd 06/18/16   Deatra Canter, FNP   omeprazole (PRILOSEC) 20 MG capsule Take 20 mg by mouth at bedtime.    [provider]  predniSONE (DELTASONE) 10 MG tablet Take 4po qd x2d then 3 po qd x2d then 2 po qd x2d then 1 po qd x2d then stop 06/29/16   Deatra Canter, FNP  predniSONE (DELTASONE) 20 MG tablet 3 tabs po day one, then 2 tabs daily x 4 days Patient not taking: Reported on 01/25/2015 03/23/14   Terri Piedra, PA-C  vitamin E 400 UNIT capsule Take 400 Units by mouth daily.    [provider]   Meds Ordered and Administered this Visit   Medications  albuterol (PROVENTIL) (2.5 MG/3ML) 0.083% nebulizer solution 2.5 mg (2.5 mg Nebulization Given 06/29/16 2015)    BP 114/80 (BP Location: Left Arm)   Pulse 86   Temp 98.4 F (36.9 C) (Oral)   Resp 16   SpO2 98%  No data found.   Physical Exam  Constitutional: He is oriented to person, place, and time. He appears well-developed and well-nourished.  HENT:  Head: Normocephalic and atraumatic.  Right Ear: External ear normal.  Left Ear: External ear normal.  Mouth/Throat: Oropharynx is clear and moist.  Eyes: Conjunctivae and EOM are normal. Pupils are equal, round, and reactive to light.  Neck: Normal range of motion. Neck supple.  Cardiovascular: Normal rate, regular rhythm and normal heart sounds.   Pulmonary/Chest: Effort normal. He has wheezes.  Abdominal: Soft. Bowel sounds are normal.  Musculoskeletal: Normal range of motion.  Neurological: He is alert and oriented to person, place, and time.  Nursing note and vitals reviewed.   Urgent Care Course     Procedures (including critical care time)  Labs Review Labs Reviewed - No data to display  Imaging Review No results found.   Visual Acuity Review  Right Eye Distance:   Left Eye Distance:   Bilateral Distance:    Right Eye Near:   Left Eye Near:    Bilateral Near:         MDM   1. Bronchitis   2. Cough    Albuterol neb tx now  Albuterol MDI Prednisone taper  8 days 10 mg #20 Augmentin  Stop smoking Push po fluids, rest, tylenol and motrin otc prn as directed for fever, arthralgias, and myalgias.  Follow up prn if sx's continue or persist.   Deatra Canter, [place] 06/29/16 2047

## 2016-08-14 ENCOUNTER — Ambulatory Visit (HOSPITAL_COMMUNITY)
Admission: EM | Admit: 2016-08-14 | Discharge: 2016-08-14 | Disposition: A | Discharge status: Home / Self Care | Payer: BLUE CROSS/BLUE SHIELD | Attending: Family Medicine | Admitting: Family Medicine

## 2016-08-14 ENCOUNTER — Encounter (HOSPITAL_COMMUNITY): Payer: Self-pay | Admitting: *Deleted

## 2016-08-14 DIAGNOSIS — B9789 Other viral agents as the cause of diseases classified elsewhere: Secondary | ICD-10-CM | POA: Diagnosis not present

## 2016-08-14 DIAGNOSIS — J069 Acute upper respiratory infection, unspecified: Secondary | ICD-10-CM

## 2016-08-14 DIAGNOSIS — K219 Gastro-esophageal reflux disease without esophagitis: Secondary | ICD-10-CM

## 2016-08-14 DIAGNOSIS — R05 Cough: Secondary | ICD-10-CM

## 2016-08-14 DIAGNOSIS — J40 Bronchitis, not specified as acute or chronic: Secondary | ICD-10-CM

## 2016-08-14 DIAGNOSIS — R059 Cough, unspecified: Secondary | ICD-10-CM

## 2016-08-14 MED ORDER — GUAIFENESIN-CODEINE 100-10 MG/5ML PO SYRP: 5.0000 mL | ORAL_SOLUTION | Freq: Three times a day (TID) | ORAL | 0 refills | Status: DC | PRN

## 2016-08-14 MED ORDER — OMEPRAZOLE 20 MG PO CPDR: 20.0000 mg | DELAYED_RELEASE_CAPSULE | Freq: Every day | ORAL | 0 refills | Status: DC

## 2016-08-14 MED ORDER — PREDNISONE 10 MG PO TABS: ORAL_TABLET | ORAL | 0 refills | Status: DC

## 2016-08-14 NOTE — ED Triage Notes (Signed)
Patient reports cough x 2 weeks with nasal congestion and chest congestion. Has been using otc meds to help. Reports inhaler irritates cough.

## 2016-08-14 NOTE — ED Provider Notes (Signed)
  [facility]   [id number] 08/14/16 Arrival Time: 1956  ASSESSMENT & PLAN:  1. Viral URI with cough   2. Bronchitis   3. Cough   4. Gastroesophageal reflux disease without esophagitis     Meds ordered this encounter  Medications  . guaiFENesin-codeine (CHERATUSSIN AC) 100-10 MG/5ML syrup    Sig: Take 5 mLs by mouth 3 (three) times daily as needed for cough.    Dispense:  120 mL    Refill:  0    Order Specific Question:   Supervising Provider    Answer:   Mardella LaymanHAGLER, BRIAN [id number]]  . predniSONE (DELTASONE) 10 MG tablet    Sig: Take 4 po qd x 2 days then 3 po qd x 2 days then 2 po  Qd x2 days then 1po qd x2days then stop    Dispense:  20 tablet    Refill:  0    Order Specific Question:   Supervising Provider    AnswerMardella Layman:   HAGLER, BRIAN [[id number]]  . omeprazole (PRILOSEC) 20 MG capsule    Sig: Take 1 capsule (20 mg total) by mouth daily.    Dispense:  30 capsule    Refill:  0    Order Specific Question:   Supervising Provider    Answer:   Mardella LaymanHAGLER, BRIAN [[id number]]    Continue using albuterol MDI Follow up with PCP  Reviewed expectations re: course of current medical issues. Questions answered. Outlined signs and symptoms indicating need for more acute intervention. Patient verbalized understanding. After Visit Summary given.   SUBJECTIVE:  Brett [name] is a 56 y.o. male who presents with complaint of coughing and wheezing which is getting worse at night time. He has recently quit smoking.  He has 1ppd x 40 year cigarette smoking hx.  He has hx of GERD and this is acting up as well.  ROS: As per HPI.   OBJECTIVE:  Vitals:   08/14/16 2027  BP: 117/88  Pulse: 78  Resp: 16  Temp: 98.2 F (36.8 C)  TempSrc: Oral  SpO2: 100%     General appearance: alert; no distress HEENT: normocephalic; atraumatic; conjunctivae normal; TMs normal; nasal mucosa normal; oral mucosa normal Neck: supple Lungs: Diminished throughout with expiratory wheezes. Heart:  regular rate and rhythm Abdomen: soft, non-tender; bowel sounds normal; no masses or organomegaly; no guarding or rebound tenderness Back: no CVA tenderness Extremities: no cyanosis or edema; symmetrical with no gross deformities Skin: warm and dry Neurologic: normal symmetric reflexes; normal gait Psychological:  alert and cooperative; normal mood and affect    Labs Reviewed - No data to display  No results found.  Allergies  Allergen Reactions  . Cialis [Tadalafil]     Hives  . Dicyclomine Hives  . Meloxicam     Rash  . Relafen [Nabumetone]     Hives    PMHx, SurgHx, SocialHx, Medications, and Allergies were reviewed in the Visit Navigator and updated as appropriate.      Deatra CanterOxford, Ardella Chhim J, [place]FNP 08/14/16 2103

## 2017-03-23 ENCOUNTER — Emergency Department (HOSPITAL_COMMUNITY)
Admission: EM | Admit: 2017-03-23 | Discharge: 2017-03-23 | Disposition: A | Discharge status: Home / Self Care | Payer: BLUE CROSS/BLUE SHIELD | Attending: Emergency Medicine | Admitting: Emergency Medicine

## 2017-03-23 ENCOUNTER — Other Ambulatory Visit: Payer: Self-pay

## 2017-03-23 ENCOUNTER — Encounter (HOSPITAL_COMMUNITY): Payer: Self-pay

## 2017-03-23 ENCOUNTER — Emergency Department (HOSPITAL_COMMUNITY): Payer: BLUE CROSS/BLUE SHIELD

## 2017-03-23 DIAGNOSIS — Y929 Unspecified place or not applicable: Secondary | ICD-10-CM | POA: Insufficient documentation

## 2017-03-23 DIAGNOSIS — Z041 Encounter for examination and observation following transport accident: Secondary | ICD-10-CM | POA: Diagnosis present

## 2017-03-23 DIAGNOSIS — S161XXA Strain of muscle, fascia and tendon at neck level, initial encounter: Secondary | ICD-10-CM

## 2017-03-23 DIAGNOSIS — Z87891 Personal history of nicotine dependence: Secondary | ICD-10-CM | POA: Diagnosis not present

## 2017-03-23 DIAGNOSIS — M62830 Muscle spasm of back: Secondary | ICD-10-CM | POA: Insufficient documentation

## 2017-03-23 DIAGNOSIS — Y939 Activity, unspecified: Secondary | ICD-10-CM | POA: Diagnosis not present

## 2017-03-23 DIAGNOSIS — Y999 Unspecified external cause status: Secondary | ICD-10-CM | POA: Diagnosis not present

## 2017-03-23 DIAGNOSIS — E119 Type 2 diabetes mellitus without complications: Secondary | ICD-10-CM | POA: Insufficient documentation

## 2017-03-23 DIAGNOSIS — Z79899 Other long term (current) drug therapy: Secondary | ICD-10-CM | POA: Insufficient documentation

## 2017-03-23 MED ORDER — CYCLOBENZAPRINE HCL 10 MG PO TABS
10.0000 mg | ORAL_TABLET | Freq: Once | ORAL | Status: AC
  Administered 2017-03-23: 10 mg via ORAL
  Filled 2017-03-23: qty 1

## 2017-03-23 MED ORDER — IBUPROFEN 600 MG PO TABS: 600.0000 mg | ORAL_TABLET | Freq: Four times a day (QID) | ORAL | 0 refills | Status: DC | PRN

## 2017-03-23 MED ORDER — CYCLOBENZAPRINE HCL 10 MG PO TABS: 10.0000 mg | ORAL_TABLET | Freq: Two times a day (BID) | ORAL | 0 refills | Status: DC | PRN

## 2017-03-23 MED ORDER — IBUPROFEN 200 MG PO TABS
600.0000 mg | ORAL_TABLET | Freq: Once | ORAL | Status: AC
Start: 2017-03-23 — End: 2017-03-23
  Administered 2017-03-23: 600 mg via ORAL
  Filled 2017-03-23: qty 3
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
## 2017-03-23 NOTE — Discharge Instructions (Signed)
Do not take the muscle relaxer if driving as it will make you sleepy. Follow up with your primary care doctor or return here as needed for worsening symptoms.
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
## 2017-04-07 ENCOUNTER — Ambulatory Visit (HOSPITAL_COMMUNITY)
Admission: EM | Admit: 2017-04-07 | Discharge: 2017-04-07 | Disposition: A | Discharge status: Home / Self Care | Payer: BLUE CROSS/BLUE SHIELD | Attending: Internal Medicine | Admitting: Internal Medicine

## 2017-04-07 ENCOUNTER — Encounter (HOSPITAL_COMMUNITY): Payer: Self-pay | Admitting: Emergency Medicine

## 2017-04-07 DIAGNOSIS — J209 Acute bronchitis, unspecified: Secondary | ICD-10-CM | POA: Diagnosis not present

## 2017-04-07 MED ORDER — HYDROCODONE-HOMATROPINE 5-1.5 MG/5ML PO SYRP: 5.0000 mL | ORAL_SOLUTION | Freq: Four times a day (QID) | ORAL | 0 refills | Status: DC | PRN

## 2017-04-07 MED ORDER — PREDNISONE 10 MG PO TABS: 40.0000 mg | ORAL_TABLET | Freq: Every day | ORAL | 0 refills | Status: AC

## 2017-04-07 MED ORDER — BENZONATATE 100 MG PO CAPS: 100.0000 mg | ORAL_CAPSULE | Freq: Three times a day (TID) | ORAL | 0 refills | Status: DC

## 2017-04-07 NOTE — ED Provider Notes (Signed)
[facility]    CSN: [id number] Arrival date & time: 04/07/17  1614     History   Chief Complaint Chief Complaint  Patient presents with  . Cough    HPI Brett [name] is a 57 y.o. male.   57 year old male, with history of asthma, GERD, arthritis, diabetes, presenting today due to cold symptoms.  Patient states that he has had cough, nasal congestion and wheezing over the past 4-5 days.  States that he has a history of bronchitis and this feels the same.  He denies any chest pain or shortness of breath.  No fever or chills.  The history is provided by the patient.  Cough  Cough characteristics:  Productive Sputum characteristics:  Green Severity:  Moderate Onset quality:  Gradual Duration:  4 days Timing:  Intermittent Progression:  Unchanged Chronicity:  New Smoker: no   Context: sick contacts   Context: not animal exposure, not exposure to allergens and not fumes   Relieved by:  Beta-agonist inhaler Worsened by:  Nothing Ineffective treatments:  None tried Associated symptoms: wheezing   Associated symptoms: no chest pain, no chills, no diaphoresis, no ear fullness, no ear pain, no eye discharge, no fever, no headaches, no myalgias, no rash, no rhinorrhea, no shortness of breath and no sore throat   Risk factors: no chemical exposure, no recent infection and no recent travel     Past Medical History:  Diagnosis Date  . Anal fissure   . Arthritis   . Colon polyps 01/13/11   Colonoscopy  . Diabetes mellitus   . GERD (gastroesophageal reflux disease)     Patient Active Problem List   Diagnosis Date Noted  . Medication management 03/23/2014  . Vitamin D deficiency 03/23/2014  . Prediabetes 03/23/2014  . FOLLICULITIS 11/05/2006  . Hyperlipemia 10/31/2006  . GERD 10/31/2006    Past Surgical History:  Procedure Laterality Date  . ANAL FISSURE REPAIR  02/24/11  . COLONOSCOPY         Home Medications    Prior to Admission medications     Medication Sig Start Date End Date Taking? Authorizing Provider  albuterol (PROVENTIL HFA;VENTOLIN HFA) 108 (90 Base) MCG/ACT inhaler Inhale 1-2 puffs into the lungs every 6 (six) hours as needed for wheezing or shortness of breath. 06/18/16   Deatra Canter, FNP  albuterol (PROVENTIL HFA;VENTOLIN HFA) 108 (90 Base) MCG/ACT inhaler Inhale 1-2 puffs into the lungs every 6 (six) hours as needed for wheezing or shortness of breath. 06/29/16   Deatra Canter, FNP  benzonatate (TESSALON) 100 MG capsule Take 1 capsule (100 mg total) by mouth every 8 (eight) hours. 04/07/17   Blue, Olivia C, PA-C  cholecalciferol (VITAMIN D) 1000 UNITS tablet Take 1,000 Units by mouth daily.    [provider]  cyclobenzaprine (FLEXERIL) 10 MG tablet Take 1 tablet (10 mg total) by mouth 2 (two) times daily as needed for muscle spasms. 03/23/17   Janne Napoleon, NP  docusate sodium (COLACE) 100 MG capsule Take 100 mg by mouth 2 (two) times daily as needed. Constipation    [provider]  glucose blood test strip Use as instructed 03/23/14   Forcucci, Courtney, PA-C  guaiFENesin-codeine (CHERATUSSIN AC) 100-10 MG/5ML syrup Take 5 mLs by mouth 3 (three) times daily as needed for cough. 08/14/16   Deatra Canter, FNP  HYDROcodone-homatropine (HYCODAN) 5-1.5 MG/5ML syrup Take 5 mLs by mouth every 6 (six) hours as needed for cough. 04/07/17   Blue, Marylene Land,  PA-C  ibuprofen (ADVIL,MOTRIN) 600 MG tablet Take 1 tablet (600 mg total) by mouth every 6 (six) hours as needed. 03/23/17   Janne NapoleonNeese, Hope M, NP  methylPREDNISolone (MEDROL DOSEPAK) 4 MG TBPK tablet Take 6-5-4-3-2-1 po qd 06/18/16   Deatra Canterxford, William J, FNP  omeprazole (PRILOSEC) 20 MG capsule Take 20 mg by mouth at bedtime.    [provider]  omeprazole (PRILOSEC) 20 MG capsule Take 1 capsule (20 mg total) by mouth daily. 08/14/16   Deatra Canterxford, William J, FNP  predniSONE (DELTASONE) 10 MG tablet Take 4 tablets (40 mg total) by mouth daily for 5 days. 04/07/17  04/12/17  Blue, Olivia C, PA-C  vitamin E 400 UNIT capsule Take 400 Units by mouth daily.    [provider]    Family History Family History  Problem Relation Age of Onset  . Rheum arthritis Mother   . Allergies Daughter   . Allergies Sister   . Asthma Daughter     Social History Social History   Tobacco Use  . Smoking status: Former Smoker    Packs/day: 0.25    Years: 27.00    Pack years: 6.75    Types: Cigarettes  . Smokeless tobacco: Never Used  . Tobacco comment: 7 ciggs a day  Substance Use Topics  . Alcohol use: Yes    Comment: socially  . Drug use: No     Allergies   Cialis [tadalafil]; Dicyclomine; Meloxicam; and Relafen [nabumetone]   Review of Systems Review of Systems  Constitutional: Negative for chills, diaphoresis and fever.  HENT: Negative for ear pain, rhinorrhea and sore throat.   Eyes: Negative for pain, discharge and visual disturbance.  Respiratory: Positive for cough and wheezing. Negative for shortness of breath.   Cardiovascular: Negative for chest pain and palpitations.  Gastrointestinal: Negative for abdominal pain and vomiting.  Genitourinary: Negative for dysuria and hematuria.  Musculoskeletal: Negative for arthralgias, back pain and myalgias.  Skin: Negative for color change and rash.  Neurological: Negative for seizures, syncope and headaches.  All other systems reviewed and are negative.    Physical Exam Triage Vital Signs ED Triage Vitals [04/07/17 1701]  Enc Vitals Group     BP 124/82     Pulse Rate 89     Resp 18     Temp 98.3 F (36.8 C)     Temp Source Oral     SpO2 98 %     Weight      Height      Head Circumference      Peak Flow      Pain Score      Pain Loc      Pain Edu?      Excl. in GC?    No data found.  Updated Vital Signs BP 124/82 (BP Location: Right Arm)   Pulse 89   Temp 98.3 F (36.8 C) (Oral)   Resp 18   SpO2 98%   Visual Acuity Right Eye Distance:   Left Eye Distance:     Bilateral Distance:    Right Eye Near:   Left Eye Near:    Bilateral Near:     Physical Exam  Constitutional: He appears well-developed and well-nourished.  HENT:  Head: Normocephalic and atraumatic.  Right Ear: Hearing, tympanic membrane, external ear and ear canal normal.  Left Ear: Hearing, tympanic membrane, external ear and ear canal normal.  Nose: Nose normal.  Mouth/Throat: Uvula is midline and oropharynx is clear and moist. No oropharyngeal  exudate, posterior oropharyngeal edema, posterior oropharyngeal erythema or tonsillar abscesses.  Eyes: Conjunctivae are normal.  Neck: Neck supple.  Cardiovascular: Normal rate and regular rhythm.  No murmur heard. Pulmonary/Chest: Effort normal and breath sounds normal. No stridor. No respiratory distress. He has no decreased breath sounds. He has no wheezes. He has no rhonchi. He has no rales.  Abdominal: Soft. There is no tenderness.  Musculoskeletal: He exhibits no edema.  Neurological: He is alert.  Skin: Skin is warm and dry.  Psychiatric: He has a normal mood and affect.  Nursing note and vitals reviewed.    UC Treatments / Results  Labs (all labs ordered are listed, but only abnormal results are displayed) Labs Reviewed - No data to display  EKG None Radiology No results found.  Procedures Procedures (including critical care time)  Medications Ordered in UC Medications - No data to display   Initial Impression / Assessment and Plan / UC Course  I have reviewed the triage vital signs and the nursing notes.  Pertinent labs & imaging results that were available during my care of the patient were reviewed by me and considered in my medical decision making (see chart for details).     We will treat for acute bronchitis with cough medication, prednisone and recommended follow-up with primary care  Final Clinical Impressions(s) / UC Diagnoses   Final diagnoses:  Acute bronchitis, unspecified organism    ED  Discharge Orders        Ordered    predniSONE (DELTASONE) 10 MG tablet  Daily     04/07/17 1727    HYDROcodone-homatropine (HYCODAN) 5-1.5 MG/5ML syrup  Every 6 hours PRN     04/07/17 1727    benzonatate (TESSALON) 100 MG capsule  Every 8 hours     04/07/17 1727       Controlled Substance Prescriptions [place] Controlled Substance Registry consulted? Not Applicable   Alecia Lemming, [place] 04/07/17 1738

## 2017-04-07 NOTE — ED Triage Notes (Signed)
Pt sts cough and congestion x 1 week 

## 2017-04-18 ENCOUNTER — Ambulatory Visit (INDEPENDENT_AMBULATORY_CARE_PROVIDER_SITE_OTHER): Payer: BLUE CROSS/BLUE SHIELD | Admitting: Physician Assistant

## 2017-04-18 ENCOUNTER — Encounter (HOSPITAL_COMMUNITY): Payer: Self-pay | Admitting: Emergency Medicine

## 2017-04-18 ENCOUNTER — Emergency Department (HOSPITAL_COMMUNITY): Payer: BLUE CROSS/BLUE SHIELD

## 2017-04-18 ENCOUNTER — Other Ambulatory Visit: Payer: Self-pay

## 2017-04-18 ENCOUNTER — Emergency Department (HOSPITAL_COMMUNITY)
Admission: EM | Admit: 2017-04-18 | Discharge: 2017-04-18 | Disposition: A | Discharge status: Home / Self Care | Payer: BLUE CROSS/BLUE SHIELD | Attending: Emergency Medicine | Admitting: Emergency Medicine

## 2017-04-18 ENCOUNTER — Encounter: Payer: Self-pay | Admitting: Physician Assistant

## 2017-04-18 VITALS — BP 120/80 | HR 100 | Temp 97.4°F | Resp 20 | Ht 66.0 in | Wt 158.0 lb

## 2017-04-18 DIAGNOSIS — Z87891 Personal history of nicotine dependence: Secondary | ICD-10-CM | POA: Diagnosis not present

## 2017-04-18 DIAGNOSIS — E119 Type 2 diabetes mellitus without complications: Secondary | ICD-10-CM | POA: Insufficient documentation

## 2017-04-18 DIAGNOSIS — R0981 Nasal congestion: Secondary | ICD-10-CM | POA: Diagnosis not present

## 2017-04-18 DIAGNOSIS — J40 Bronchitis, not specified as acute or chronic: Secondary | ICD-10-CM

## 2017-04-18 DIAGNOSIS — R059 Cough, unspecified: Secondary | ICD-10-CM

## 2017-04-18 DIAGNOSIS — Z79899 Other long term (current) drug therapy: Secondary | ICD-10-CM | POA: Insufficient documentation

## 2017-04-18 DIAGNOSIS — R0989 Other specified symptoms and signs involving the circulatory and respiratory systems: Secondary | ICD-10-CM | POA: Insufficient documentation

## 2017-04-18 DIAGNOSIS — R05 Cough: Secondary | ICD-10-CM | POA: Diagnosis not present

## 2017-04-18 DIAGNOSIS — J3489 Other specified disorders of nose and nasal sinuses: Secondary | ICD-10-CM | POA: Diagnosis not present

## 2017-04-18 DIAGNOSIS — R1310 Dysphagia, unspecified: Secondary | ICD-10-CM

## 2017-04-18 MED ORDER — PROMETHAZINE-DM 6.25-15 MG/5ML PO SYRP: 5.0000 mL | ORAL_SOLUTION | Freq: Four times a day (QID) | ORAL | 1 refills | Status: DC | PRN

## 2017-04-18 MED ORDER — AZELASTINE HCL 0.1 % NA SOLN: 2.0000 | Freq: Two times a day (BID) | NASAL | 2 refills | Status: DC

## 2017-04-18 MED ORDER — PANTOPRAZOLE SODIUM 40 MG PO TBEC: 40.0000 mg | DELAYED_RELEASE_TABLET | Freq: Every day | ORAL | 1 refills | Status: DC

## 2017-04-18 NOTE — Patient Instructions (Addendum)
Try the astelin nasal spray, can do up to 3 x a day but do it at night for certain  Take certizine at night for allergies- this med is over the counter  Take the protonix 1 pill in the morning 30 mins before food  Do these things for 2 weeks After 2 weeks if you are still having an issues please start on the inhaler I gave you  Can do steroid inhaler, NEED TO DO DAILY, this is NOT a rescue inhaler so if you are acutely short of breath please use your albuterol or call 911.  Do 1 puff once a day.  Do before you brush your teeth OR wash your mouth afterwards.  IF YOU DO NOT WASH YOUR MOUTH OUT IT CAN CAUSE YEAST Can do 2 tsp vinegar with water and switch to help prevent yeast or help yeast in your mouth.   Call in 1 month to set up an office visit if you are not better.   Go to the ER if any chest pain, shortness of breath, nausea, dizziness, severe HA, changes vision/speech  Common causes of cough OR hoarseness OR sore throat:   Allergies, Viral Infections, Acid Reflux and Bacterial Infections.   Allergies and viral infections cause a cough OR sore throat by post nasal drip and are often worse at night, can also have sneezing, lower grade fevers, clear/yellow mucus. This is best treated with allergy medications or nasal sprays.  Please get on allegra for 1-2 weeks The strongest is allegra or fexafinadine  Cheapest at [employer], [employer], [employer]   Bacterial infections are more severe than allergies or viral infections with fever, teeth pain, fatigue. This can be treated with prednisone and the same over the counter medication and after 7 days can be treated with an antibiotic.   Silent reflux/GERD can cause a cough OR sore throat OR hoarseness WITHOUT heart burn because the esophagus that goes to the stomach and trachea that goes to the lungs are very close and when you lay down the acid can irritate your throat and lungs. This can cause hoarseness, cough, and wheezing. Please stop any alcohol  or anti-inflammatories like aleve/advil/ibuprofen and start an over the counter Prilosec or omeprazole 1-2 times daily before food for 2 weeks, then switch to over the counter zantac/ratinidine or pepcid/famotadine once at night for 2 weeks.    sometimes irritation causes more irritation. Try voice rest, use sugar free cough drops to prevent coughing, and try to stop clearing your throat.   If you ever have a cough that does not go away after trying these things please make a follow up visit for further evaluation or we can refer you to a specialist. Or if you ever have shortness of breath or chest pain go to the ER.    Silent reflux: Not all heartburn burns...Marland KitchenMarland KitchenMarland Kitchen  What is LPR? Laryngopharyngeal reflux (LPR) or silent reflux is a condition in which acid that is made in the stomach travels up the esophagus (swallowing tube) and gets to the throat. Not everyone with reflux has a lot of heartburn or indigestion. In fact, many people with LPR never have heartburn. This is why LPR is called SILENT REFLUX, and the terms "Silent reflux" and "LPR" are often used interchangeably. Because LPR is silent, it is sometimes difficult to diagnose.  How can you tell if you have LPR?  Marland Kitchen Chronic hoarseness- Some people have hoarseness that comes and goes . throat clearing  . Cough . It  can cause shortness of breath and cause asthma like symptoms. Marland Kitchen. a feeling of a lump in the throat  . difficulty swallowing . a problem with too much nose and throat drainage.  . Some people will feel their esophagus spasm which feels like their heart beating hard and fast, this will usually be after a meal, at rest, or lying down at night.    How do I treat this? Treatment for LPR should be individualized, and your doctor will suggest the best treatment for you. Generally there are several treatments for LPR: . changing habits and diet to reduce reflux,  . medications to reduce stomach acid, and  . surgery to prevent  reflux. Most people with LPR need to modify how and when they eat, as well as take some medication, to get well. Sometimes, nonprescription liquid antacids, such as Maalox, Gelucil and Mylanta are recommended. When used, these antacids should be taken four times each day - one tablespoon one hour after each meal and before bedtime. Dietary and lifestyle changes alone are not often enough to control LPR - medications that reduce stomach acid are also usually needed. These must be prescribed by our doctor.   TIPS FOR REDUCING REFLUX AND LPR Control your LIFE-STYLE and your DIET! Marland Kitchen. If you use tobacco, QUIT.  Marland Kitchen. Smoking makes you reflux. After every cigarette you have some LPR.  . Don't wear clothing that is too tight, especially around the waist (trousers, corsets, belts).  . Do not lie down just after eating...in fact, do not eat within three hours of bedtime.  . You should be on a low-fat diet.  . Limit your intake of red meat.  . Limit your intake of butter.  Marland Kitchen. Avoid fried foods.  . Avoid chocolate  . Avoid cheese.  Marland Kitchen. Avoid eggs. Marland Kitchen. Specifically avoid caffeine (especially coffee and tea), soda pop (especially cola) and mints.  . Avoid alcoholic beverages, particularly in the evening.

## 2017-04-18 NOTE — Discharge Instructions (Addendum)
You may take over-the-counter medicine for symptomatic relief, such as Tylenol, Motrin, Delsyn, Robitussin, TheraFlu, Alka seltzer , black elderberry, etc. Please limit acetaminophen (Tylenol) to 4000 mg and Ibuprofen (Motrin, Advil, etc.) to 2400 mg for a 24hr period. Please note that other over-the-counter medicine may contain acetaminophen or ibuprofen as a component of their ingredients.
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
## 2017-04-18 NOTE — ED Provider Notes (Signed)
[facility] EMERGENCY DEPARTMENT Provider Note  CSN: [id number] Arrival date & time: 04/18/17 4098  Chief Complaint(s) Cough (Chest Congestion )  HPI Brett [name] is a 57 y.o. male   The history is provided by the patient.  Cough  This is a recurrent problem. Episode onset: 3 weeks. The problem has not changed since onset.The cough is productive of sputum. There has been no fever. Associated symptoms include rhinorrhea. Pertinent negatives include no chest pain, no chills and no shortness of breath. Associated symptoms comments: Nasal congestion . He has tried cough syrup for the symptoms. The treatment provided mild relief. He is not a smoker (quit 1 week ago).    Past Medical History Past Medical History:  Diagnosis Date  . Anal fissure   . Arthritis   . Colon polyps 01/13/11   Colonoscopy  . Diabetes mellitus   . GERD (gastroesophageal reflux disease)    Patient Active Problem List   Diagnosis Date Noted  . Medication management 03/23/2014  . Vitamin D deficiency 03/23/2014  . Prediabetes 03/23/2014  . FOLLICULITIS 11/05/2006  . Hyperlipemia 10/31/2006  . GERD 10/31/2006   Home Medication(s) Prior to Admission medications   Medication Sig Start Date End Date Taking? Authorizing Provider  albuterol (PROVENTIL HFA;VENTOLIN HFA) 108 (90 Base) MCG/ACT inhaler Inhale 1-2 puffs into the lungs every 6 (six) hours as needed for wheezing or shortness of breath. 06/18/16   Deatra Canter, FNP  albuterol (PROVENTIL HFA;VENTOLIN HFA) 108 (90 Base) MCG/ACT inhaler Inhale 1-2 puffs into the lungs every 6 (six) hours as needed for wheezing or shortness of breath. 06/29/16   Deatra Canter, FNP  benzonatate (TESSALON) 100 MG capsule Take 1 capsule (100 mg total) by mouth every 8 (eight) hours. 04/07/17   Blue, Olivia C, PA-C  cholecalciferol (VITAMIN D) 1000 UNITS tablet Take 1,000 Units by mouth daily.    [provider]  cyclobenzaprine (FLEXERIL) 10 MG  tablet Take 1 tablet (10 mg total) by mouth 2 (two) times daily as needed for muscle spasms. 03/23/17   Janne Napoleon, NP  docusate sodium (COLACE) 100 MG capsule Take 100 mg by mouth 2 (two) times daily as needed. Constipation    [provider]  glucose blood test strip Use as instructed 03/23/14   Forcucci, Courtney, PA-C  guaiFENesin-codeine (CHERATUSSIN AC) 100-10 MG/5ML syrup Take 5 mLs by mouth 3 (three) times daily as needed for cough. 08/14/16   Deatra Canter, FNP  HYDROcodone-homatropine (HYCODAN) 5-1.5 MG/5ML syrup Take 5 mLs by mouth every 6 (six) hours as needed for cough. 04/07/17   Blue, Olivia C, PA-C  ibuprofen (ADVIL,MOTRIN) 600 MG tablet Take 1 tablet (600 mg total) by mouth every 6 (six) hours as needed. 03/23/17   Janne Napoleon, NP  methylPREDNISolone (MEDROL DOSEPAK) 4 MG TBPK tablet Take 6-5-4-3-2-1 po qd 06/18/16   Deatra Canter, FNP  omeprazole (PRILOSEC) 20 MG capsule Take 20 mg by mouth at bedtime.    [provider]  omeprazole (PRILOSEC) 20 MG capsule Take 1 capsule (20 mg total) by mouth daily. 08/14/16   Deatra Canter, FNP  vitamin E 400 UNIT capsule Take 400 Units by mouth daily.    [provider]  Past Surgical History Past Surgical History:  Procedure Laterality Date  . ANAL FISSURE REPAIR  02/24/11  . COLONOSCOPY     Family History Family History  Problem Relation Age of Onset  . Rheum arthritis Mother   . Allergies Daughter   . Allergies Sister   . Asthma Daughter     Social History Social History   Tobacco Use  . Smoking status: Former Smoker    Packs/day: 0.25    Years: 27.00    Pack years: 6.75    Types: Cigarettes  . Smokeless tobacco: Never Used  . Tobacco comment: 7 ciggs a day  Substance Use Topics  . Alcohol use: Yes    Comment: socially  . Drug use: No   Allergies Cialis  [tadalafil]; Dicyclomine; Meloxicam; and Relafen [nabumetone]  Review of Systems Review of Systems  Constitutional: Negative for chills.  HENT: Positive for rhinorrhea.   Respiratory: Positive for cough. Negative for shortness of breath.   Cardiovascular: Negative for chest pain.   All other systems are reviewed and are negative for acute change except as noted in the HPI  Physical Exam Vital Signs  I have reviewed the triage vital signs BP (!) 107/98 (BP Location: Right Arm)   Pulse 82   Temp 98 F (36.7 C) (Oral)   Resp 16   Ht 5\' 6"  (1.676 m)   Wt 75.3 kg (166 lb)   SpO2 98%   BMI 26.79 kg/m   Physical Exam  Constitutional: He is oriented to person, place, and time. He appears well-developed and well-nourished. No distress.  HENT:  Head: Normocephalic and atraumatic.  Nose: Mucosal edema present.  Mouth/Throat: Posterior oropharyngeal erythema (mild with cobblestoning) present.  Eyes: Pupils are equal, round, and reactive to light. Conjunctivae and EOM are normal. Right eye exhibits no discharge. Left eye exhibits no discharge. No scleral icterus.  Neck: Normal range of motion. Neck supple.  Cardiovascular: Normal rate and regular rhythm. Exam reveals no gallop and no friction rub.  No murmur heard. Pulmonary/Chest: Effort normal. No stridor. No respiratory distress. He has rhonchi in the right upper field, the right middle field, the left upper field and the left middle field. He has no rales.  Abdominal: Soft. He exhibits no distension. There is no tenderness.  Musculoskeletal: He exhibits no edema or tenderness.  Neurological: He is alert and oriented to person, place, and time.  Skin: Skin is warm and dry. No rash noted. He is not diaphoretic. No erythema.  Psychiatric: He has a normal mood and affect.  Vitals reviewed.   ED Results and Treatments Labs (all labs ordered are listed, but only abnormal results are displayed) Labs Reviewed - No data to display                                                                                                                        EKG  EKG Interpretation  Date/Time:    Ventricular Rate:    PR Interval:  QRS Duration:   QT Interval:    QTC Calculation:   R Axis:     Text Interpretation:        Radiology Dg Chest 2 View  Result Date: 04/18/2017 CLINICAL DATA:  Acute onset of productive cough and chest congestion. Rhinorrhea. EXAM: CHEST - 2 VIEW COMPARISON:  Chest radiograph performed 01/11/2016 FINDINGS: The lungs are well-aerated and clear. There is no evidence of focal opacification, pleural effusion or pneumothorax. The heart is normal in size; the mediastinal contour is within normal limits. No acute osseous abnormalities are seen. IMPRESSION: No acute cardiopulmonary process seen. Electronically Signed   By: Roanna RaiderJeffery  Chang M.D.   On: 04/18/2017 06:55   Pertinent labs & imaging results that were available during my care of the patient were reviewed by me and considered in my medical decision making (see chart for details).  Medications Ordered in ED Medications - No data to display                                                                                                                                  Procedures Procedures  (including critical care time)  Medical Decision Making / ED Course I have reviewed the nursing notes for this encounter and the patient's prior records (if available in EHR or on provided paperwork).    3 weeks of persistent cough no associated fevers or chills.  Seen here 10 days ago for the same and diagnosed with bronchitis.  Possible lingering symptoms from viral bronchitis, patient reports seasonal allergy and there may be some superimposed allergic factors.  Patient also has quit smoking 1 week ago and may be undergoing lung tissue/airway changes.  On exam patient does have bilateral rhonchi.  No wheezing.  Chest x-ray obtained without evidence of  pneumonia.  Recommended symptomatic management and PCP follow-up if needed.  Final Clinical Impression(s) / ED Diagnoses Final diagnoses:  Bronchitis   Disposition: Discharge  Condition: Good  I have discussed the results, Dx and Tx plan with the patient who expressed understanding and agree(s) with the plan. Discharge instructions discussed at great length. The patient was given strict return precautions who verbalized understanding of the instructions. No further questions at time of discharge.    ED Discharge Orders    None       Follow Up: Lucky CowboyMcKeown, William, MD [address] [place] [id number] [phone]  Schedule an appointment as soon as possible for a visit  1-2 weeks, If symptoms do not improve or  worsen      This chart was dictated using voice recognition software.  Despite best efforts to proofread,  errors can occur which can change the documentation meaning.   Nira Connardama, Arvil Utz Eduardo, MD 04/18/17 [phone]

## 2017-04-18 NOTE — ED Triage Notes (Signed)
Patient reports persistent/worsening productive cough , chest congestion , rhinorrhea/nasal congestion this week . Denies fever or chills .

## 2017-04-19 ENCOUNTER — Other Ambulatory Visit: Payer: Self-pay | Admitting: Physician Assistant

## 2017-04-19 MED ORDER — BENZONATATE 100 MG PO CAPS: 100.0000 mg | ORAL_CAPSULE | Freq: Three times a day (TID) | ORAL | 0 refills | Status: DC | PRN

## 2017-04-26 ENCOUNTER — Telehealth: Payer: Self-pay

## 2017-04-26 NOTE — Telephone Encounter (Signed)
Lvm TO INFORM pt that it was ok to start the inhaler.

## 2017-05-17 ENCOUNTER — Telehealth: Payer: Self-pay

## 2017-05-17 NOTE — Telephone Encounter (Signed)
SPOKE WITH PT IN OFFICE  BREO SAMPLE INHALER WAS GIVEN AT HIS LAST VISIT STATES THE SAMPLE HELPED A LOT AND WOULD LIKE ANOTHER SAMPLE TO HELP FINISH THE COUGH OFF. PT STATES HIS COUGH HAS GOTTEN SO MUCH BETTER.  PT STATES HE WILL WAIT & IF THE COUGH DOES NOT GO AWAY. HE WILL RETURN TO THE OFFICE.   MAY 16TH 2019 AT 3:55PM BY

## 2017-08-08 DIAGNOSIS — E663 Overweight: Secondary | ICD-10-CM | POA: Insufficient documentation

## 2017-08-08 NOTE — Progress Notes (Deleted)
Complete Physical  Assessment and Plan:  Diagnoses and all orders for this visit:  Encounter for routine adult health examination without abnormal findings  Gastroesophageal reflux disease, esophagitis presence not specified  Vitamin D deficiency  Prediabetes  Medication management  Hyperlipidemia, unspecified hyperlipidemia type  Overweight (BMI 25.0-29.9)  Screening for hematuria or proteinuria  Screening for cardiovascular condition  Screening for prostate cancer     Discussed med's effects and SE's. Screening labs and tests as requested with regular follow-up as recommended. Over 40 minutes of exam, counseling, chart review and critical decision making was performed  Future Appointments  Date Time Provider Department Center  08/09/2017  3:00 PM Judd Gaudierorbett, Klever Twyford, NP [facility] None  08/13/2018  3:00 PM Judd Gaudierorbett, Zuley Lutter, NP [facility] None     HPI Patient presents for a complete physical. He has Hyperlipemia; GERD; Medication management; Vitamin D deficiency; Prediabetes; and Overweight (BMI 25.0-29.9) on their problem list.  BMI is There is no height or weight on file to calculate BMI., he {HAS HAS ZOX:09604}OT:18834} been working on diet and exercise. Wt Readings from Last 3 Encounters:  04/18/17 158 lb (71.7 kg)  04/18/17 166 lb (75.3 kg)  03/23/17 168 lb (76.2 kg)   His blood pressure {HAS HAS NOT:18834} been controlled at home, today their BP is   He {DOES_DOES VWU:98119}OT:18564} workout. He denies chest pain, shortness of breath, dizziness.   He is not on cholesterol medication and denies myalgias. His cholesterol is at goal. The cholesterol last visit was:   Lab Results  Component Value Date   CHOL 165 03/23/2014   HDL 63 03/23/2014   LDLCALC 90 03/23/2014   TRIG 61 03/23/2014   CHOLHDL 2.6 03/23/2014   He {Has/has not:18111} been working on diet and exercise for prediabetes, he {ACTION; IS/IS NOT:21021397} on bASA, he {ACTION; IS/IS NOT:21021397} on ACE/ARB and denies  {Symptoms; diabetes w/o none:19199}. Last A1C in the office was:  Lab Results  Component Value Date   HGBA1C 6.1 (H) 03/23/2014   Last GFR: Lab Results  Component Value Date   GFRAA >89 03/23/2014   Patient is on Vitamin D supplement.   Lab Results  Component Value Date   VD25OH 30 03/23/2014     Last PSA was: Lab Results  Component Value Date   PSA 1.23 03/23/2014    Current Medications:  Current Outpatient Medications on File Prior to Visit  Medication Sig Dispense Refill  . albuterol (PROVENTIL HFA;VENTOLIN HFA) 108 (90 Base) MCG/ACT inhaler Inhale 1-2 puffs into the lungs every 6 (six) hours as needed for wheezing or shortness of breath. 1 Inhaler 1  . azelastine (ASTELIN) 0.1 % nasal spray Place 2 sprays into both nostrils 2 (two) times daily. Use in each nostril as directed 30 mL 2  . benzonatate (TESSALON PERLES) 100 MG capsule Take 1 capsule (100 mg total) by mouth 3 (three) times daily as needed for cough (Max: 600mg  per day). 60 capsule 0  . cholecalciferol (VITAMIN D) 1000 UNITS tablet Take 1,000 Units by mouth daily.    . cyclobenzaprine (FLEXERIL) 10 MG tablet Take 1 tablet (10 mg total) by mouth 2 (two) times daily as needed for muscle spasms. 20 tablet 0  . docusate sodium (COLACE) 100 MG capsule Take 100 mg by mouth 2 (two) times daily as needed. Constipation    . glucose blood test strip Use as instructed 100 each 0  . HYDROcodone-homatropine (HYCODAN) 5-1.5 MG/5ML syrup Take 5 mLs by mouth every 6 (six) hours as needed for  cough. 120 mL 0  . ibuprofen (ADVIL,MOTRIN) 600 MG tablet Take 1 tablet (600 mg total) by mouth every 6 (six) hours as needed. 20 tablet 0  . omeprazole (PRILOSEC) 20 MG capsule Take 1 capsule (20 mg total) by mouth daily. 30 capsule 0  . pantoprazole (PROTONIX) 40 MG tablet Take 1 tablet (40 mg total) by mouth daily. 30 tablet 1  . vitamin E 400 UNIT capsule Take 400 Units by mouth daily.     No current facility-administered medications on  file prior to visit.    Allergies:  Allergies  Allergen Reactions  . Cialis [Tadalafil]     Hives  . Dicyclomine Hives  . Meloxicam     Rash  . Relafen [Nabumetone]     Hives   Health Maintenance:  Immunization History  Administered Date(s) Administered  . Influenza Whole 10/31/2006, 11/03/2010, 11/02/2011  . Influenza-Unspecified 01/03/2012  . PPD Test 03/17/2013  . Pneumococcal-Unspecified 10/13/2009  . Td 01/03/2008  . Tdap 01/03/2011   Tetanus: 2013 Pneumovax: 2011 Prevnar 13: -  Flu vaccine: *** Zostavax: declines  DEXA: n/a Colonoscopy: 01/2011 EGD: ***  Eye Exam: Dentist:  Patient Care Team: Lucky Cowboy, MD as PCP - General (Internal Medicine) Waymon Budge, MD as Consulting Physician (Pulmonary Disease) Charlott Rakes, MD as Consulting Physician (Gastroenterology) Joelyn Oms, MD as Referring Physician (Orthopedic Surgery)  Medical History:  has Hyperlipemia; GERD; Medication management; Vitamin D deficiency; Prediabetes; and Overweight (BMI 25.0-29.9) on their problem list. Surgical History:  He  has a past surgical history that includes Colonoscopy and Anal fissure repair (02/24/11). Family History:  His family history includes Allergies in his daughter and sister; Asthma in his daughter; Rheum arthritis in his mother. Social History:   reports that he has quit smoking. His smoking use included cigarettes. He has a 6.75 pack-year smoking history. He has never used smokeless tobacco. He reports that he drinks alcohol. He reports that he does not use drugs. Review of Systems:  Review of Systems  Constitutional: Negative for malaise/fatigue and weight loss.  HENT: Negative for hearing loss and tinnitus.   Eyes: Negative for blurred vision and double vision.  Respiratory: Negative for cough, sputum production, shortness of breath and wheezing.   Cardiovascular: Negative for chest pain, palpitations, orthopnea, claudication, leg swelling and  PND.  Gastrointestinal: Negative for abdominal pain, blood in stool, constipation, diarrhea, heartburn, melena, nausea and vomiting.  Genitourinary: Negative.   Musculoskeletal: Negative for falls, joint pain and myalgias.  Skin: Negative for rash.  Neurological: Negative for dizziness, tingling, sensory change, weakness and headaches.  Endo/Heme/Allergies: Negative for polydipsia.  Psychiatric/Behavioral: Negative.  Negative for depression, memory loss, substance abuse and suicidal ideas. The patient is not nervous/anxious and does not have insomnia.   All other systems reviewed and are negative.   Physical Exam: Estimated body mass index is 25.5 kg/m as calculated from the following:   Height as of 04/18/17: 5\' 6"  (1.676 m).   Weight as of 04/18/17: 158 lb (71.7 kg). There were no vitals taken for this visit. General Appearance: Well nourished, in no apparent distress.  Eyes: PERRLA, EOMs, conjunctiva no swelling or erythema, normal fundi and vessels.  Sinuses: No Frontal/maxillary tenderness  ENT/Mouth: Ext aud canals clear, normal light reflex with TMs without erythema, bulging. Good dentition. No erythema, swelling, or exudate on post pharynx. Tonsils not swollen or erythematous. Hearing normal.  Neck: Supple, thyroid normal. No bruits  Respiratory: Respiratory effort normal, BS equal bilaterally without rales, rhonchi,  wheezing or stridor.  Cardio: RRR without murmurs, rubs or gallops. Brisk peripheral pulses without edema.  Chest: symmetric, with normal excursions and percussion.  Abdomen: Soft, nontender, no guarding, rebound, hernias, masses, or organomegaly.  Lymphatics: Non tender without lymphadenopathy.  Genitourinary:  Musculoskeletal: Full ROM all peripheral extremities,5/5 strength, and normal gait.  Skin: Warm, dry without rashes, lesions, ecchymosis. Neuro: Cranial nerves intact, reflexes equal bilaterally. Normal muscle tone, no cerebellar symptoms. Sensation intact.   Psych: Awake and oriented X 3, normal affect, Insight and Judgment appropriate.   EKG: WNL no changes. AORTA SCAN: defer  Dan Maker 1:51 PM [facility]

## 2017-08-09 ENCOUNTER — Encounter: Payer: Self-pay | Admitting: Adult Health

## 2017-10-18 ENCOUNTER — Ambulatory Visit (INDEPENDENT_AMBULATORY_CARE_PROVIDER_SITE_OTHER): Payer: BLUE CROSS/BLUE SHIELD

## 2017-10-18 DIAGNOSIS — Z23 Encounter for immunization: Secondary | ICD-10-CM

## 2017-12-25 ENCOUNTER — Emergency Department (HOSPITAL_COMMUNITY): Payer: Self-pay

## 2017-12-25 ENCOUNTER — Encounter (HOSPITAL_COMMUNITY): Payer: Self-pay | Admitting: Emergency Medicine

## 2017-12-25 ENCOUNTER — Emergency Department (HOSPITAL_COMMUNITY)
Admission: EM | Admit: 2017-12-25 | Discharge: 2017-12-25 | Disposition: A | Discharge status: Home / Self Care | Payer: Self-pay | Attending: Emergency Medicine | Admitting: Emergency Medicine

## 2017-12-25 ENCOUNTER — Emergency Department (HOSPITAL_BASED_OUTPATIENT_CLINIC_OR_DEPARTMENT_OTHER)
Admit: 2017-12-25 | Discharge: 2017-12-25 | Disposition: A | Discharge status: Home / Self Care | Payer: Self-pay | Attending: Emergency Medicine | Admitting: Emergency Medicine

## 2017-12-25 DIAGNOSIS — E119 Type 2 diabetes mellitus without complications: Secondary | ICD-10-CM | POA: Insufficient documentation

## 2017-12-25 DIAGNOSIS — M25561 Pain in right knee: Secondary | ICD-10-CM | POA: Insufficient documentation

## 2017-12-25 DIAGNOSIS — M7989 Other specified soft tissue disorders: Secondary | ICD-10-CM

## 2017-12-25 DIAGNOSIS — Z79899 Other long term (current) drug therapy: Secondary | ICD-10-CM | POA: Insufficient documentation

## 2017-12-25 DIAGNOSIS — F17210 Nicotine dependence, cigarettes, uncomplicated: Secondary | ICD-10-CM | POA: Insufficient documentation

## 2017-12-25 NOTE — Discharge Instructions (Addendum)
3 today was negative, your ultrasound today was also negative there is no clot on your right leg.  May take some anti-inflammatories to help with your right knee pain, I have provided a referral for an orthopedic physician please schedule an appointment as needed.  If you experience any shortness of breath, worsening symptoms you may return to the ED.

## 2017-12-25 NOTE — ED Notes (Signed)
Knee sleeve applied to right knee 

## 2017-12-25 NOTE — ED Triage Notes (Signed)
Pt c/o right knee pain and lower right leg swelling for over week. Reports makes it hard to stand due to pain. Marland Kitchen.denies any injuries.

## 2017-12-25 NOTE — ED Provider Notes (Signed)
[facility]-EMERGENCY DEPT Provider Note   CSN: [id number] Arrival date & time: 12/25/17  [id number]     History   Chief Complaint Chief Complaint  Patient presents with  . Knee Pain  . Leg Swelling    HPI Brett [name] is a 57 y.o. male.  57 y.o male smoker with no PMH presents to the ED with a chief complain of right leg pain x 1 week. 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 He states the pain began mainly in his right knee and is worse with bending and walking, he states hearing a "popping" sound when moving.He also reports right calf tenderness. He has tried aleve for his pain but reports no improvement in symptoms. He does have an appointment with his primary care physician on Thursday but reports he could not wait until then. He denies any recent immobilization, shortness of breath, chest pain or fevers.      Past Medical History:  Diagnosis Date  . Anal fissure   . Arthritis   . Colon polyps 01/13/11   Colonoscopy  . Diabetes mellitus   . GERD (gastroesophageal reflux disease)     Patient Active Problem List   Diagnosis Date Noted  . Overweight (BMI 25.0-29.9) 08/08/2017  . Medication management 03/23/2014  . Vitamin D deficiency 03/23/2014  . Prediabetes 03/23/2014  . Hyperlipemia 10/31/2006  . GERD 10/31/2006    Past Surgical History:  Procedure Laterality Date  . ANAL FISSURE REPAIR  02/24/11  . COLONOSCOPY          Home Medications    Prior to Admission medications   Medication Sig Start Date End Date Taking? Authorizing Provider  albuterol (PROVENTIL HFA;VENTOLIN HFA) 108 (90 Base) MCG/ACT inhaler Inhale 1-2 puffs into the lungs every 6 (six) hours as needed for wheezing or shortness of breath. 06/29/16  Yes Oxford, Anselm PancoastWilliam J, FNP  azelastine (ASTELIN) 0.1 % nasal spray Place 2 sprays into both nostrils 2 (two) times daily. Use in each nostril as directed 04/18/17  Yes  Quentin Mullingollier, Amanda, PA-C  cholecalciferol (VITAMIN D) 1000 UNITS tablet Take 1,000 Units by mouth daily.   Yes [provider]  GOLDENSEAL PO Take 2 capsules by mouth 3 (three) times daily.   Yes [provider]  naproxen sodium (ALEVE) 220 MG tablet Take 440-660 mg by mouth 2 (two) times daily with a meal.   Yes [provider]  vitamin E 400 UNIT capsule Take 400 Units by mouth daily.   Yes [provider]  benzonatate (TESSALON PERLES) 100 MG capsule Take 1 capsule (100 mg total) by mouth 3 (three) times daily as needed for cough (Max: 600mg  per day). Patient not taking: Reported on 12/25/2017 04/19/17   Quentin Mullingollier, Amanda, PA-C  cyclobenzaprine (FLEXERIL) 10 MG tablet Take 1 tablet (10 mg total) by mouth 2 (two) times daily as needed for muscle spasms. Patient not taking: Reported on 12/25/2017 03/23/17   Kerrie BuffaloNeese, Hope M, NP  glucose blood test strip Use as instructed 03/23/14   Shirleen Schirmerllis, Courtney, PA-C  HYDROcodone-homatropine [facility](HYCODAN) 5-1.5 MG/5ML syrup Take 5 mLs by mouth every 6 (six) hours as needed for cough. Patient not taking: Reported on 12/25/2017 04/07/17   Blue, Zollie Scalelivia C, PA-C  ibuprofen (ADVIL,MOTRIN) 600 MG tablet Take 1 tablet (600 mg total) by mouth every 6 (six) hours as needed. Patient not taking: Reported on 12/25/2017 03/23/17   Janne NapoleonNeese, Hope M, NP  omeprazole [facility](PRILOSEC)  20 MG capsule Take 1 capsule (20 mg total) by mouth daily. Patient not taking: Reported on 12/25/2017 08/14/16   Deatra Canter, FNP  pantoprazole (PROTONIX) 40 MG tablet Take 1 tablet (40 mg total) by mouth daily. Patient not taking: Reported on 12/25/2017 04/18/17 04/18/18  Quentin Mulling, PA-C    Family History Family History  Problem Relation Age of Onset  . Rheum arthritis Mother   . Allergies Daughter   . Allergies Sister   . Asthma Daughter     Social History Social History   Tobacco Use  . Smoking status: Current Every Day Smoker    Packs/day: 0.25    Years: 27.00      Pack years: 6.75    Types: Cigarettes  . Smokeless tobacco: Never Used  . Tobacco comment: 7 ciggs a day  Substance Use Topics  . Alcohol use: Yes    Comment: socially  . Drug use: No     Allergies   Benzodiazepines; Chantix  [varenicline]; Cialis [tadalafil]; Dicyclomine; Meloxicam; Penicillins; and Relafen [nabumetone]   Review of Systems Review of Systems  Constitutional: Negative for chills and fever.  HENT: Negative for ear pain and sore throat.   Eyes: Negative for pain and visual disturbance.  Respiratory: Negative for cough and shortness of breath.   Cardiovascular: Positive for leg swelling (right). Negative for chest pain and palpitations.  Gastrointestinal: Negative for abdominal pain and vomiting.  Genitourinary: Negative for dysuria and hematuria.  Musculoskeletal: Negative for arthralgias and back pain.  Skin: Negative for color change and rash.  Neurological: Negative for seizures and syncope.  All other systems reviewed and are negative.    Physical Exam Updated Vital Signs BP (!) 141/89 (BP Location: Right Arm)   Pulse 85   Temp 97.9 F (36.6 C) (Oral)   Resp 16   Ht 5\' 6"  (1.676 m)   Wt 75.8 kg   SpO2 98%   BMI 26.95 kg/m   Physical Exam Vitals signs and nursing note reviewed.  Constitutional:      Appearance: He is well-developed.  HENT:     Head: Normocephalic and atraumatic.  Eyes:     General: No scleral icterus.    Pupils: Pupils are equal, round, and reactive to light.  Neck:     Musculoskeletal: Normal range of motion.  Cardiovascular:     Heart sounds: Normal heart sounds.  Pulmonary:     Effort: Pulmonary effort is normal.     Breath sounds: Normal breath sounds. No wheezing.  Chest:     Chest wall: No tenderness.  Abdominal:     General: Bowel sounds are normal. There is no distension.     Palpations: Abdomen is soft.     Tenderness: There is no abdominal tenderness.  Musculoskeletal:        General: No deformity.      Right knee: He exhibits no swelling, no effusion, no ecchymosis, no deformity, no erythema and normal alignment.     Right lower leg: He exhibits tenderness. He exhibits no swelling. No edema.     Left lower leg: No edema.     Comments: Some calf tenderness within the right calf, pulses present, strength is 5/5 with dorsiflexion and plantarflexion.   Skin:    General: Skin is warm and dry.  Neurological:     Mental Status: He is alert and oriented to person, place, and time.      ED Treatments / Results  Labs (all labs ordered are listed,  but only abnormal results are displayed) Labs Reviewed - No data to display  EKG None  Radiology Dg Knee 2 Views Right  Result Date: 12/25/2017 CLINICAL DATA:  Right knee pain and swelling for the past week. EXAM: RIGHT KNEE - 1-2 VIEW COMPARISON:  None. FINDINGS: The joint spaces are maintained. No significant degenerative changes, acute bony findings, osteochondral lesion or erosive changes. Suspect a small to moderate joint effusion. IMPRESSION: No acute bony findings or significant degenerative changes. Suspect small to moderate joint effusion. Electronically Signed   By: Rudie Meyer M.D.   On: 12/25/2017 11:32   Vas [place] Lower Extremity Venous (dvt) (only [facility])  Result Date: 12/25/2017  Lower Venous Study Indications: Swelling.  Performing Technologist: Chanda Busing RVT  Examination Guidelines: A complete evaluation includes B-mode imaging, spectral Doppler, color Doppler, and power Doppler as needed of all accessible portions of each vessel. Bilateral testing is considered an integral part of a complete examination. Limited examinations for reoccurring indications may be performed as noted.  Right Venous Findings: +---------+---------------+---------+-----------+----------+-------+          CompressibilityPhasicitySpontaneityPropertiesSummary +---------+---------------+---------+-----------+----------+-------+ CFV      Full            Yes      Yes                          +---------+---------------+---------+-----------+----------+-------+ SFJ      Full                                                 +---------+---------------+---------+-----------+----------+-------+ FV Prox  Full                                                 +---------+---------------+---------+-----------+----------+-------+ FV Mid   Full                                                 +---------+---------------+---------+-----------+----------+-------+ FV DistalFull                                                 +---------+---------------+---------+-----------+----------+-------+ PFV      Full                                                 +---------+---------------+---------+-----------+----------+-------+ POP      Full           Yes      Yes                          +---------+---------------+---------+-----------+----------+-------+ PTV      Full                                                 +---------+---------------+---------+-----------+----------+-------+  PERO     Full                                                 +---------+---------------+---------+-----------+----------+-------+  Left Venous Findings: +---+---------------+---------+-----------+----------+-------+    CompressibilityPhasicitySpontaneityPropertiesSummary +---+---------------+---------+-----------+----------+-------+ CFVFull           Yes      Yes                          +---+---------------+---------+-----------+----------+-------+    Summary: Right: There is no evidence of deep vein thrombosis in the lower extremity. Edema noted in the popliteal fossa extending into the proximal, posterior calf. Left: No evidence of common femoral vein obstruction.  *See table(s) above for measurements and observations. Electronically signed by Coral ElseVance Brabham MD on 12/25/2017 at 10:57:24 AM.    Final     Procedures Procedures  (including critical care time)  Medications Ordered in ED Medications - No data to display   Initial Impression / Assessment and Plan / ED Course  I have reviewed the triage vital signs and the nursing notes.  Pertinent labs & imaging results that were available during my care of the patient were reviewed by me and considered in my medical decision making (see chart for details).    Patient presents with right knee pain and right calf swelling and tenderness x 1 week. 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 He reports clicking in his knee with bending and movement. Patient has been taken aleve for the pain. DG right knee obtained to r/o any actute abnormality. Xray was negative but does show a mild joint effusion. US obtained to r/o any DVT, negative as well.  Is well-appearing, no tachycardia, no shortness of breath.  Patient does report a new job 2 weeks ago loading and lifting, I suspect a muscle strain to his right calf as his DVT study along with his x-ray were negative.  Likely had a previous Baker's cyst that ruptured which is now showing a mild effusion.  Will advise patient to obtain some over-the-counter anti-inflammatories along with provided with a knee sleeve and a referral for Dr. Dion SaucierLandau to follow-up as needed.  Patient is encouraged to remain active and follow-up with his primary care as needed.  Final Clinical Impressions(s) / ED Diagnoses   Final diagnoses:  Acute pain of right knee    ED Discharge Orders    None       Claude MangesSoto, Jacee Enerson, PA-C 12/25/17 1157    Virgina NorfolkCuratolo, Adam, DO 12/25/17 1842

## 2017-12-25 NOTE — Progress Notes (Addendum)
Right lower extremity venous duplex has been completed. Negative for DVT. Edema is noted in the popliteal fossa extending into the proximal, posterior calf.  Results were given to [facility] Soto PA.  12/25/17 10:48 AM Olen CordialGreg Alyssa Mancera RVT

## 2017-12-26 NOTE — Progress Notes (Signed)
RESCHEDULED

## 2017-12-27 ENCOUNTER — Ambulatory Visit: Payer: Self-pay | Admitting: Internal Medicine

## 2017-12-31 NOTE — Progress Notes (Deleted)
Assessment and Plan:  There are no diagnoses linked to this encounter.    Further disposition pending results of labs. Discussed med's effects and SE's.   Over 30 minutes of exam, counseling, chart review, and critical decision making was performed.   Future Appointments  Date Time Provider Department Center  01/01/2018 11:00 AM Elder NegusMcClanahan, Zarya Lasseigne, NP [facility] None    ------------------------------------------------------------------------------------------------------------------   HPI 57 y.o.male presents for  Past Medical History:  Diagnosis Date  . Anal fissure   . Arthritis   . Colon polyps 01/13/11   Colonoscopy  . Diabetes mellitus   . GERD (gastroesophageal reflux disease)      Allergies  Allergen Reactions  . Benzodiazepines Hives  . Chantix  [Varenicline] Hives  . Cialis [Tadalafil]     Hives  . Dicyclomine Hives  . Meloxicam     Rash  . Penicillins Hives    DID THE REACTION INVOLVE: Swelling of the face/tongue/throat, SOB, or low BP? Unknown Sudden or severe rash/hives, skin peeling, or the inside of the mouth or nose? Unknown Did it require medical treatment? n  When did it last happen?Over 5 years ago. If all above answers are "NO", may proceed with cephalosporin use.   . Relafen [Nabumetone]     Hives    Current Outpatient Medications on File Prior to Visit  Medication Sig  . albuterol (PROVENTIL HFA;VENTOLIN HFA) 108 (90 Base) MCG/ACT inhaler Inhale 1-2 puffs into the lungs every 6 (six) hours as needed for wheezing or shortness of breath.  Marland Kitchen. azelastine (ASTELIN) 0.1 % nasal spray Place 2 sprays into both nostrils 2 (two) times daily. Use in each nostril as directed  . benzonatate (TESSALON PERLES) 100 MG capsule Take 1 capsule (100 mg total) by mouth 3 (three) times daily as needed for cough (Max: 600mg  per day). (Patient not taking: Reported on 12/25/2017)  . cholecalciferol (VITAMIN D) 1000 UNITS tablet Take 1,000 Units by mouth daily.  .  cyclobenzaprine (FLEXERIL) 10 MG tablet Take 1 tablet (10 mg total) by mouth 2 (two) times daily as needed for muscle spasms. (Patient not taking: Reported on 12/25/2017)  . glucose blood test strip Use as instructed  . GOLDENSEAL PO Take 2 capsules by mouth 3 (three) times daily.  Marland Kitchen. HYDROcodone-homatropine (HYCODAN) 5-1.5 MG/5ML syrup Take 5 mLs by mouth every 6 (six) hours as needed for cough. (Patient not taking: Reported on 12/25/2017)  . ibuprofen (ADVIL,MOTRIN) 600 MG tablet Take 1 tablet (600 mg total) by mouth every 6 (six) hours as needed. (Patient not taking: Reported on 12/25/2017)  . naproxen sodium (ALEVE) 220 MG tablet Take 440-660 mg by mouth 2 (two) times daily with a meal.  . omeprazole (PRILOSEC) 20 MG capsule Take 1 capsule (20 mg total) by mouth daily. (Patient not taking: Reported on 12/25/2017)  . pantoprazole (PROTONIX) 40 MG tablet Take 1 tablet (40 mg total) by mouth daily. (Patient not taking: Reported on 12/25/2017)  . vitamin E 400 UNIT capsule Take 400 Units by mouth daily.   No current facility-administered medications on file prior to visit.     ROS: all negative except above.   Physical Exam:  There were no vitals taken for this visit.  General Appearance: Well nourished, in no apparent distress. Eyes: PERRLA, EOMs, conjunctiva no swelling or erythema Sinuses: No Frontal/maxillary tenderness ENT/Mouth: Ext aud canals clear, TMs without erythema, bulging. No erythema, swelling, or exudate on post pharynx.  Tonsils not swollen or erythematous. Hearing normal.  Neck: Supple, thyroid normal.  Respiratory: Respiratory effort normal, BS equal bilaterally without rales, rhonchi, wheezing or stridor.  Cardio: RRR with no MRGs. Brisk peripheral pulses without edema.  Abdomen: Soft, + BS.  Non tender, no guarding, rebound, hernias, masses. Lymphatics: Non tender without lymphadenopathy.  Musculoskeletal: Full ROM, 5/5 strength, normal gait.  Skin: Warm, dry without  rashes, lesions, ecchymosis.  Neuro: Cranial nerves intact. Normal muscle tone, no cerebellar symptoms. Sensation intact.  Psych: Awake and oriented X 3, normal affect, Insight and Judgment appropriate.     Elder NegusKyra Emanuelle Hammerstrom, NP 1:28 PM [facility]

## 2018-01-01 ENCOUNTER — Ambulatory Visit: Payer: Self-pay | Admitting: Adult Health Nurse Practitioner

## 2018-04-27 ENCOUNTER — Other Ambulatory Visit: Payer: Self-pay | Admitting: Internal Medicine

## 2018-04-27 MED ORDER — PREDNISONE 20 MG PO TABS: ORAL_TABLET | ORAL | 0 refills | Status: DC

## 2018-05-13 ENCOUNTER — Telehealth: Payer: Self-pay | Admitting: Physician Assistant

## 2018-05-13 NOTE — Telephone Encounter (Signed)
Spoke to wife to schedule Televisit. Per [employer], website, insurance is not active. Wife will have patient to call insurance and call back to schedule Televisit to evaluate right knee.

## 2018-05-13 NOTE — Telephone Encounter (Signed)
Schedule OV, has not been seen x 04/2017. Can be telephone visit but needs office visit.

## 2018-05-13 NOTE — Telephone Encounter (Signed)
Patient called with continued right knee pain and swelling x . States has been wearing knee brace and taking OTC- Aleve and Tylenol but not helping, having difficulty getting around. States his insurance had lapsed,  now active. Would like referral to ortho. Please advise.

## 2018-05-13 NOTE — Telephone Encounter (Signed)
Schedule OV, has not been seen x 04/2017. Can be telephone visit but needs to be visit.

## 2018-05-21 ENCOUNTER — Ambulatory Visit: Payer: Self-pay | Admitting: Adult Health Nurse Practitioner

## 2018-06-12 ENCOUNTER — Ambulatory Visit: Payer: BC Managed Care – PPO | Admitting: Physician Assistant

## 2018-06-12 ENCOUNTER — Other Ambulatory Visit: Payer: Self-pay

## 2018-06-12 ENCOUNTER — Encounter: Payer: Self-pay | Admitting: Physician Assistant

## 2018-06-12 DIAGNOSIS — M25561 Pain in right knee: Secondary | ICD-10-CM | POA: Diagnosis not present

## 2018-06-12 MED ORDER — TRAMADOL HCL 50 MG PO TABS: ORAL_TABLET | ORAL | 0 refills | Status: DC

## 2018-06-12 NOTE — Progress Notes (Signed)
THIS ENCOUNTER IS A VIRTUAL VISIT DUE TO COVID-19 - PATIENT WAS NOT SEEN IN THE OFFICE.  PATIENT HAS CONSENTED TO VIRTUAL VISIT / TELEMEDICINE VISIT   Virtual Visit via telephone Note  I connected with Brett [name] on 06/12/2018 by telephone.  I verified that I am speaking with the correct person using two identifiers.    I discussed the limitations of evaluation and management by telemedicine and the availability of in person appointments. The patient expressed understanding and agreed to proceed.  Patient at work and wearing a mask, difficult to hear at times.   History of Present Illness: 58 y.o. AAM calls with right knee pain x 5 months, has been taking ibuprofen. 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 Worse with walking, moving.  Had ER visit 12/25/2017, had negative [place] for DVT but showed inflammation and possible ruptured baker's cyst. Pain is worse top and the sides. One day was unable to put pressure on it, states no redness but was warm to the touch.  Some popping and clicking with walking/stairs.  Pain 8/10 for pain Has been wearing knee brace.   Medications    Current Outpatient Medications (Respiratory):  .  albuterol (PROVENTIL HFA;VENTOLIN HFA) 108 (90 Base) MCG/ACT inhaler, Inhale 1-2 puffs into the lungs every 6 (six) hours as needed for wheezing or shortness of breath. Marland Kitchen  azelastine (ASTELIN) 0.1 % nasal spray, Place 2 sprays into both nostrils 2 (two) times daily. Use in each nostril as directed  Current Outpatient Medications (Analgesics):  .  ibuprofen (ADVIL,MOTRIN) 600 MG tablet, Take 1 tablet (600 mg total) by mouth every 6 (six) hours as needed. .  naproxen sodium (ALEVE) 220 MG tablet, Take 440-660 mg by mouth 2 (two) times daily with a meal.   Current Outpatient Medications (Other):  .  cholecalciferol (VITAMIN D) 1000 UNITS tablet, Take 1,000 Units by mouth daily. Marland Kitchen  glucose blood test strip, Use as instructed .  GOLDENSEAL PO, Take 2  capsules by mouth 3 (three) times daily. Marland Kitchen  omeprazole (PRILOSEC) 20 MG capsule, Take 1 capsule (20 mg total) by mouth daily. .  vitamin E 400 UNIT capsule, Take 400 Units by mouth daily. .  pantoprazole (PROTONIX) 40 MG tablet, Take 1 tablet (40 mg total) by mouth daily. (Patient not taking: Reported on 12/25/2017)  Problem list He has Hyperlipemia; GERD; Medication management; Vitamin D deficiency; Prediabetes; and Overweight (BMI 25.0-29.9) on their problem list.   Observations/Objective: General Appearance:Well sounding, in no apparent distress.  ENT/Mouth: No hoarseness, No cough for duration of visit.  Respiratory: completing full sentences without distress, without audible wheeze Neuro: Awake and oriented X 3,  Psych:  Insight and Judgment appropriate.    Assessment and Plan: Right knee pain X 6 months getting worse Will refer to ortho for evaluation, pain 8/10, not able to bear weight some days Likely OA rule out gout, meniscal tear, etc Informed to go to ER if any swelling, warmth, or signs of septic joint  Follow Up Instructions:  I discussed the assessment and treatment plan with the patient. The patient was provided an opportunity to ask questions and all were answered. The patient agreed with the plan and demonstrated an understanding of the instructions.   The patient was advised to call back or seek an in-person evaluation if the symptoms worsen or if the condition fails to improve as anticipated.  I provided 20 minutes of non-face-to-face time during this encounter.  No future appointments.   Estill Bamberg  Silverio Lay, PA-C

## 2018-06-12 NOTE — Patient Instructions (Signed)
Aleve, ibuprofen is an antiinflammatory You can take tylenol (500mg ) or tylenol arthritis (650mg ) with the meloxicam/antiinflammatories. The max you can take of tylenol a day is 3000mg  daily, this is a max of 6 pills a day of the regular tyelnol (500mg ) or a max of 4 a day of the tylenol arthritis (650mg ) as long as no other medications you are taking contain tylenol.   this can cause inflammation in your stomach and can cause ulcers or bleeding, this will look like black tarry stools Make sure you taking it with food Try not to take it daily, take AS needed Can take with pepcid   Acute Knee Pain, Adult Acute knee pain is sudden and may be caused by damage, swelling, or irritation of the muscles and tissues that support your knee. The injury may result from:  A fall.  An injury to your knee from twisting motions.  A hit to the knee.  Infection. Acute knee pain may go away on its own with time and rest. If it does not, your health care provider may order tests to find the cause of the pain. These may include:  Imaging tests, such as an X-ray, MRI, or ultrasound.  Joint aspiration. In this test, fluid is removed from the knee.  Arthroscopy. In this test, a lighted tube is inserted into the knee and an image is projected onto a TV screen.  Biopsy. In this test, a sample of tissue is removed from the body and studied under a microscope. Follow these instructions at home: Pay attention to any changes in your symptoms. Take these actions to relieve your pain. If you have a knee sleeve or brace:   Wear the sleeve or brace as told by your health care provider. Remove it only as told by your health care provider.  Loosen the sleeve or brace if your toes tingle, become numb, or turn cold and blue.  Keep the sleeve or brace clean.  If the sleeve or brace is not waterproof: ? Do not let it get wet. ? Cover it with a watertight covering when you take a bath or shower. Activity  Rest  your knee.  Do not do things that cause pain or make pain worse.  Avoid high-impact activities or exercises, such as running, jumping rope, or doing jumping jacks.  Work with a physical therapist to make a safe exercise program, as recommended by your health care provider. Do exercises as told by your physical therapist. Managing pain, stiffness, and swelling   If directed, put ice on the knee: ? Put ice in a plastic bag. ? Place a towel between your skin and the bag. ? Leave the ice on for 20 minutes, 2-3 times a day.  If directed, use an elastic bandage to put pressure (compression) on your injured knee. This may control swelling, give support, and help with discomfort. General instructions  Take over-the-counter and prescription medicines only as told by your health care provider.  Raise (elevate) your knee above the level of your heart when you are sitting or lying down.  Sleep with a pillow under your knee.  Do not use any products that contain nicotine or tobacco, such as cigarettes, e-cigarettes, and chewing tobacco. These can delay healing. If you need help quitting, ask your health care provider.  If you are overweight, work with your health care provider and a dietitian to set a weight-loss goal that is healthy and reasonable for you. Extra weight can put pressure on your  knee.  Keep all follow-up visits as told by your health care provider. This is important. Contact a health care provider if:  Your knee pain continues, changes, or gets worse.  You have a fever along with knee pain.  Your knee feels warm to the touch.  Your knee buckles or locks up. Get help right away if:  Your knee swells, and the swelling becomes worse.  You cannot move your knee.  You have severe pain in your knee. Summary  Acute knee pain can be caused by a fall, an injury, an infection, or damage, swelling, or irritation of the tissues that support your knee.  Your health care  provider may perform tests to find out the cause of the pain.  Pay attention to any changes in your symptoms. Relieve your pain with rest, medicines, light activity, and use of ice.  Get help if your pain continues or becomes worse, your knee swells, or you cannot move your knee. This information is not intended to replace advice given to you by your health care provider. Make sure you discuss any questions you have with your health care provider. Document Released: 10/16/2006 Document Revised: 05/31/2017 Document Reviewed: 05/31/2017 Elsevier Interactive Patient Education  2019 [employer].

## 2018-06-17 ENCOUNTER — Telehealth: Payer: Self-pay

## 2018-06-17 ENCOUNTER — Other Ambulatory Visit: Payer: Self-pay | Admitting: Physician Assistant

## 2018-06-17 MED ORDER — PREDNISONE 20 MG PO TABS: ORAL_TABLET | ORAL | 0 refills | Status: DC

## 2018-06-17 NOTE — Telephone Encounter (Signed)
Spoke with patient's wife & she has been made aware of Rx that was sent.   FYI: Patient does have an appt with ortho on June 17th 2020.

## 2018-06-17 NOTE — Telephone Encounter (Signed)
-----   Message from [name], [place] sent at 06/17/2018 11:42 AM EDT ----- Regarding: RE: knee pain Contact: [phone] Has he called back ortho, they have called and left message.  Will send in trial of prednisone, needs to call to schedule with ortho, if worsening pain or new symptoms go to ER.  Estill Bamberg ----- Message ----- From: Elenor Quinones, CMA Sent: 06/17/2018  10:14 AM EDT To: [name], PA-C Subject: knee pain                                      Medications for knee pain are not helping at all Please Advise

## 2018-06-18 ENCOUNTER — Ambulatory Visit (INDEPENDENT_AMBULATORY_CARE_PROVIDER_SITE_OTHER): Payer: BC Managed Care – PPO | Admitting: Orthopaedic Surgery

## 2018-06-18 ENCOUNTER — Other Ambulatory Visit: Payer: Self-pay

## 2018-06-18 ENCOUNTER — Ambulatory Visit: Payer: Self-pay

## 2018-06-18 ENCOUNTER — Encounter: Payer: Self-pay | Admitting: Orthopaedic Surgery

## 2018-06-18 DIAGNOSIS — G8929 Other chronic pain: Secondary | ICD-10-CM

## 2018-06-18 DIAGNOSIS — M25561 Pain in right knee: Secondary | ICD-10-CM | POA: Diagnosis not present

## 2018-06-18 DIAGNOSIS — M25562 Pain in left knee: Secondary | ICD-10-CM | POA: Diagnosis not present

## 2018-06-18 MED ORDER — METHYLPREDNISOLONE ACETATE 40 MG/ML IJ SUSP
40.0000 mg | INTRAMUSCULAR | Status: AC | PRN
  Administered 2018-06-18: 40 mg via INTRA_ARTICULAR

## 2018-06-18 MED ORDER — BUPIVACAINE HCL 0.5 % IJ SOLN
2.0000 mL | INTRAMUSCULAR | Status: AC | PRN
  Administered 2018-06-18: 2 mL via INTRA_ARTICULAR

## 2018-06-18 MED ORDER — LIDOCAINE HCL 1 % IJ SOLN
2.0000 mL | INTRAMUSCULAR | Status: AC | PRN
  Administered 2018-06-18: 2 mL

## 2018-06-18 NOTE — Progress Notes (Signed)
Office Visit Note   Patient: Brett [name]           Date of Birth: 11/01/1960           MRN: [id number] Visit Date: 06/18/2018              Requested by: Quentin Mullingollier, Amanda, PA-C [address] [place],  [place] [id number] PCP: Lucky CowboyMcKeown, William, MD   Assessment & Plan: Visit Diagnoses:  1. Chronic pain of both knees     Plan: Impression is bilateral medial knee pain of at least 6 months.  Findings are concerning for degenerative meniscal tear versus chondromalacia.  We will inject both knees today and track as response to this.  Patient will call us back if he does not notice any improvement in the next couple weeks.  MRI is likely next step if no improvement.  Follow-Up Instructions: Return if symptoms worsen or fail to improve.   Orders:  Orders Placed This Encounter  Procedures  . XR KNEE 3 VIEW LEFT   No orders of the defined types were placed in this encounter.     Procedures: Large Joint Inj: bilateral knee on 06/18/2018 4:41 PM Indications: pain Details: 22 G needle  Arthrogram: No  Medications (Right): 2 mL lidocaine 1 %; 2 mL bupivacaine 0.5 %; 40 mg methylPREDNISolone acetate 40 MG/ML Medications (Left): 2 mL lidocaine 1 %; 2 mL bupivacaine 0.5 %; 40 mg methylPREDNISolone acetate 40 MG/ML Outcome: tolerated well, no immediate complications Patient was prepped and draped in the usual sterile fashion.       Clinical Data: No additional findings.   Subjective: Chief Complaint  Patient presents with  . Right Knee - Pain  . Left Knee - Pain    Brett [name] is a very pleasant 58 year old gentleman comes in for evaluation of bilateral knee pain for at least 6 months.  He denies any injuries.  He states morning stiffness and start up stiffness and pain.  He states that he will have swelling in the right knee after prolonged standing and activity.  He has tried tramadol, prednisone, ibuprofen, Aleve without significant relief.  His pain is localized  to the medial aspect of both knees.  He feels like the knee wants to pop and give way.   Review of Systems  Constitutional: Negative.   All other systems reviewed and are negative.    Objective: Vital Signs: There were no vitals taken for this visit.  Physical Exam Vitals signs and nursing note reviewed.  Constitutional:      Appearance: He is well-developed.  HENT:     Head: Normocephalic and atraumatic.  Eyes:     Pupils: Pupils are equal, round, and reactive to light.  Neck:     Musculoskeletal: Neck supple.  Pulmonary:     Effort: Pulmonary effort is normal.  Abdominal:     Palpations: Abdomen is soft.  Musculoskeletal: Normal range of motion.  Skin:    General: Skin is warm.  Neurological:     Mental Status: He is alert and oriented to person, place, and time.  Psychiatric:        Behavior: Behavior normal.        Thought Content: Thought content normal.        Judgment: Judgment normal.     Ortho Exam Bilateral knee exam shows no joint effusion.  Medial joint line tenderness bilaterally.  Pain at the medial joint line with McMurray testing.  Collaterals and cruciates are stable. Specialty Comments:  No specialty comments available.  Imaging: Xr Knee 3 View Left  Result Date: 06/18/2018 Mild medial compartment joint space narrowing of the right knee.  Normal left knee    PMFS History: Patient Active Problem List   Diagnosis Date Noted  . Overweight (BMI 25.0-29.9) 08/08/2017  . Medication management 03/23/2014  . Vitamin D deficiency 03/23/2014  . Prediabetes 03/23/2014  . Hyperlipemia 10/31/2006  . GERD 10/31/2006   Past Medical History:  Diagnosis Date  . Anal fissure   . Arthritis   . Colon polyps 01/13/11   Colonoscopy  . Diabetes mellitus   . GERD (gastroesophageal reflux disease)     Family History  Problem Relation Age of Onset  . Rheum arthritis Mother   . Allergies Daughter   . Allergies Sister   . Asthma Daughter     Past  Surgical History:  Procedure Laterality Date  . ANAL FISSURE REPAIR  02/24/11  . COLONOSCOPY     Social History   Occupational History    Employer: [employer]  Tobacco Use  . Smoking status: Current Every Day Smoker    Packs/day: 0.25    Years: 27.00    Pack years: 6.75    Types: Cigarettes  . Smokeless tobacco: Never Used  . Tobacco comment: 7 ciggs a day  Substance and Sexual Activity  . Alcohol use: Yes    Comment: socially  . Drug use: No  . Sexual activity: Not on file

## 2018-06-26 ENCOUNTER — Other Ambulatory Visit: Payer: Self-pay | Admitting: Orthopaedic Surgery

## 2018-06-26 ENCOUNTER — Telehealth: Payer: Self-pay | Admitting: Orthopaedic Surgery

## 2018-06-26 DIAGNOSIS — M25562 Pain in left knee: Secondary | ICD-10-CM

## 2018-06-26 DIAGNOSIS — M25561 Pain in right knee: Secondary | ICD-10-CM

## 2018-06-26 NOTE — Telephone Encounter (Signed)
Please advise 

## 2018-06-26 NOTE — Telephone Encounter (Signed)
both

## 2018-06-26 NOTE — Telephone Encounter (Signed)
Patient called and stated that the injection didn't help any and an MRI was suggested at lst appt.  Please call patient to advise  [phone] ([phone] until 3

## 2018-06-26 NOTE — Telephone Encounter (Signed)
Orders placed, patient aware waiting for authorization then imaging facility will call to schedule.

## 2018-06-26 NOTE — Telephone Encounter (Signed)
Is it for bilateral knees or R/L?

## 2018-06-26 NOTE — Telephone Encounter (Signed)
Ok please go ahead and order MRI to r/o structural abnormalities.  Thanks.

## 2018-06-27 ENCOUNTER — Telehealth: Payer: Self-pay | Admitting: Orthopaedic Surgery

## 2018-06-27 NOTE — Telephone Encounter (Signed)
Patient called and stated that he came in for inj but still in pain. Wanting to know if he can get pain medication to hold him over until info from insurance company om MRI.  [phone]

## 2018-06-27 NOTE — Telephone Encounter (Signed)
pls advise. Thanks.  

## 2018-06-28 MED ORDER — TRAMADOL HCL 50 MG PO TABS: 50.0000 mg | ORAL_TABLET | Freq: Three times a day (TID) | ORAL | 0 refills | Status: DC | PRN

## 2018-06-28 NOTE — Telephone Encounter (Signed)
Needs to wait at least 14 days after shot as it can take up to 14 days to take effect.  Shot was on 6/16, if still in pain 6/30, please let us know and we will order mri.  Happy to call in tramadol, but nothing stronger.  Let me know

## 2018-06-28 NOTE — Telephone Encounter (Signed)
[employer]. He was unavailable and wife said she will take message for him to call us back.Brett [name]

## 2018-06-28 NOTE — Telephone Encounter (Signed)
rx called to pharmacy. I called patient and advised. 

## 2018-06-28 NOTE — Telephone Encounter (Signed)
Patient called back to advise. Would like Tramadol sent into [facility].

## 2018-06-28 NOTE — Telephone Encounter (Signed)
Yes, thank you.  5o mg 1 tab tid prn pain.  #20

## 2018-07-02 ENCOUNTER — Telehealth: Payer: Self-pay | Admitting: Orthopaedic Surgery

## 2018-07-02 NOTE — Telephone Encounter (Signed)
Patient called and stated that the Tramadol is irritating his stomach and not helping the pain. They did not schedule his MRI until 7/25 and [facility] not here until 7/31 and pt has to work. Review was sch 08/06/18.   Wants to know if something else can be called in for pain.

## 2018-07-02 NOTE — Telephone Encounter (Signed)
Tylenol #3.  #20 tabs.  No opiates will be given if tylenol #3 does not work.

## 2018-07-02 NOTE — Telephone Encounter (Signed)
See below

## 2018-07-03 ENCOUNTER — Other Ambulatory Visit: Payer: Self-pay

## 2018-07-03 MED ORDER — ACETAMINOPHEN-CODEINE #3 300-30 MG PO TABS: 1.0000 | ORAL_TABLET | Freq: Four times a day (QID) | ORAL | 0 refills | Status: DC | PRN

## 2018-07-03 NOTE — Telephone Encounter (Signed)
Called into pharmacy

## 2018-07-09 ENCOUNTER — Other Ambulatory Visit: Payer: Self-pay

## 2018-07-09 ENCOUNTER — Telehealth: Payer: Self-pay | Admitting: Orthopaedic Surgery

## 2018-07-09 MED ORDER — ACETAMINOPHEN-CODEINE #3 300-30 MG PO TABS: 1.0000 | ORAL_TABLET | Freq: Four times a day (QID) | ORAL | 0 refills | Status: DC | PRN

## 2018-07-09 NOTE — Telephone Encounter (Signed)
Called into pharmacy

## 2018-07-09 NOTE — Telephone Encounter (Signed)
Please advise 

## 2018-07-09 NOTE — Telephone Encounter (Signed)
Yes

## 2018-07-09 NOTE — Telephone Encounter (Signed)
Patient called requesting prescription refill of Tylenol #3 to be sent to [facility] at [address]

## 2018-07-22 ENCOUNTER — Telehealth: Payer: Self-pay | Admitting: Orthopaedic Surgery

## 2018-07-22 NOTE — Telephone Encounter (Signed)
Patient called to request refill of Tylenol w/codeine.  Please call patient to advise [phone]

## 2018-07-22 NOTE — Telephone Encounter (Signed)
Please advise on message below. Thank you!

## 2018-07-23 ENCOUNTER — Other Ambulatory Visit: Payer: Self-pay

## 2018-07-23 ENCOUNTER — Other Ambulatory Visit: Payer: Self-pay | Admitting: Orthopaedic Surgery

## 2018-07-23 MED ORDER — ACETAMINOPHEN-CODEINE #3 300-30 MG PO TABS: 1.0000 | ORAL_TABLET | Freq: Four times a day (QID) | ORAL | 0 refills | Status: DC | PRN

## 2018-07-23 NOTE — Telephone Encounter (Signed)
Called in Rx for Tylenol #3 300-30mg  tablet to [facility] on [address] per [place].

## 2018-07-23 NOTE — Telephone Encounter (Signed)
Ok to rf? 

## 2018-07-23 NOTE — Telephone Encounter (Signed)
sure

## 2018-07-23 NOTE — Telephone Encounter (Signed)
Ok to call in same instructions as last time

## 2018-07-27 ENCOUNTER — Other Ambulatory Visit: Payer: Self-pay

## 2018-07-27 ENCOUNTER — Ambulatory Visit
Admission: RE | Admit: 2018-07-27 | Discharge: 2018-07-27 | Disposition: A | Discharge status: Home / Self Care | Payer: BC Managed Care – PPO | Source: Ambulatory Visit | Attending: Orthopaedic Surgery | Admitting: Orthopaedic Surgery

## 2018-07-27 DIAGNOSIS — M25561 Pain in right knee: Secondary | ICD-10-CM

## 2018-07-27 DIAGNOSIS — M25562 Pain in left knee: Secondary | ICD-10-CM

## 2018-07-30 ENCOUNTER — Telehealth: Payer: Self-pay | Admitting: Orthopaedic Surgery

## 2018-07-30 NOTE — Telephone Encounter (Signed)
pls advise. Thanks.  

## 2018-07-30 NOTE — Telephone Encounter (Signed)
Cannot give anything stronger than tylenol #3.  He can add ibuprofen 800 mg tid if needed.  Ok to give 3 month temp placard.  I am not sure if knee brace would help very much right now.  Best to get mri and f/u after to discuss results and tailor treatment pending those results

## 2018-07-30 NOTE — Telephone Encounter (Signed)
Patient called stating that the medication is not working for his knees and he would also like a knee brace. Patient also has to walk a long distance from his car to his work, patient would like a handicap placard application.   CB#[phone].  Thank you.

## 2018-07-30 NOTE — Telephone Encounter (Signed)
S/w patient. Placard at front desk to pick up. He will proceed with scan.

## 2018-08-06 ENCOUNTER — Encounter: Payer: Self-pay | Admitting: Orthopaedic Surgery

## 2018-08-06 ENCOUNTER — Other Ambulatory Visit: Payer: Self-pay

## 2018-08-06 ENCOUNTER — Ambulatory Visit (INDEPENDENT_AMBULATORY_CARE_PROVIDER_SITE_OTHER): Payer: BC Managed Care – PPO | Admitting: Orthopaedic Surgery

## 2018-08-06 DIAGNOSIS — S83232A Complex tear of medial meniscus, current injury, left knee, initial encounter: Secondary | ICD-10-CM | POA: Insufficient documentation

## 2018-08-06 DIAGNOSIS — S83231A Complex tear of medial meniscus, current injury, right knee, initial encounter: Secondary | ICD-10-CM | POA: Insufficient documentation

## 2018-08-06 DIAGNOSIS — S83232D Complex tear of medial meniscus, current injury, left knee, subsequent encounter: Secondary | ICD-10-CM

## 2018-08-06 DIAGNOSIS — S83231D Complex tear of medial meniscus, current injury, right knee, subsequent encounter: Secondary | ICD-10-CM

## 2018-08-06 NOTE — Progress Notes (Signed)
Office Visit Note   Patient: Brett [name]           Date of Birth: Mar 23, 1960           MRN: [id number] Visit Date: 08/06/2018              Requested by: Unk Pinto, [place],  [place] [address] PCP: Unk Pinto, MD   Assessment & Plan: Visit Diagnoses:  1. Complex tear of medial meniscus of left knee as current injury, subsequent encounter   2. Complex tear of medial meniscus of right knee as current injury, subsequent encounter     Plan: I independently reviewed and interpreted the MRI studies which show complex tear of medial menisci of both knees.  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  He has had minimal relief from cortisone injection.  At this point patient elects to proceed with arthroscopic left knee partial medial meniscectomy and staged right partial medial meniscectomy.  We discussed at length about the pros and cons of doing both knee scopes at the same time.  His wife was available for discussion over the phone and together we agreed to start with a left knee scope first.  We will schedule his surgery ASAP per his wishes.  Risks and rehab and recovery reviewed today.  All questions answered to their satisfaction. Total face to face encounter time was greater than 25 minutes and over half of this time was spent in counseling and/or coordination of care.  Follow-Up Instructions: Return if symptoms worsen or fail to improve.   Orders:  No orders of the defined types were placed in this encounter.  No orders of the defined types were placed in this encounter.     Procedures: No procedures performed   Clinical Data: No additional findings.   Subjective: Chief Complaint  Patient presents with  . Follow-up    [name] returns today for discussion and review of his MRI of both of his knees.  His left knee is bothering him more.  He does a  lot of walking at work.  He is having difficulty with normal walking and ambulation due to the pain in his knees.  He is having mechanical symptoms such as catching and locking and giving way as well as swelling..   Review of Systems  Constitutional: Negative.   All other systems reviewed and are negative.    Objective: Vital Signs: There were no vitals taken for this visit.  Physical Exam Vitals signs and nursing note reviewed.  Constitutional:      Appearance: He is well-developed.  Pulmonary:     Effort: Pulmonary effort is normal.  Abdominal:     Palpations: Abdomen is soft.  Skin:    General: Skin is warm.  Neurological:     Mental Status: He is alert and oriented to person, place, and time.  Psychiatric:        Behavior: Behavior normal.        Thought Content: Thought content normal.        Judgment: Judgment normal.     Ortho Exam Bilateral knee exams show small joint effusions.  He has significant medial joint line tenderness.  Pain with McMurray testing. Specialty Comments:  No specialty comments available.  Imaging: No results found.   PMFS History: Patient Active Problem List   Diagnosis Date Noted  . Complex tear of medial meniscus of  left knee as current injury 08/06/2018  . Complex tear of medial meniscus of right knee as current injury 08/06/2018  . Overweight (BMI 25.0-29.9) 08/08/2017  . Medication management 03/23/2014  . Vitamin D deficiency 03/23/2014  . Prediabetes 03/23/2014  . Hyperlipemia 10/31/2006  . GERD 10/31/2006   Past Medical History:  Diagnosis Date  . Anal fissure   . Arthritis   . Colon polyps 01/13/11   Colonoscopy  . Diabetes mellitus   . GERD (gastroesophageal reflux disease)     Family History  Problem Relation Age of Onset  . Rheum arthritis Mother   . Allergies Daughter   . Allergies Sister   . Asthma Daughter     Past Surgical History:  Procedure Laterality Date  . ANAL FISSURE REPAIR  02/24/11  .  COLONOSCOPY     Social History   Occupational History    Employer: [employer]  Tobacco Use  . Smoking status: Current Every Day Smoker    Packs/day: 0.25    Years: 27.00    Pack years: 6.75    Types: Cigarettes  . Smokeless tobacco: Never Used  . Tobacco comment: 7 ciggs a day  Substance and Sexual Activity  . Alcohol use: Yes    Comment: socially  . Drug use: No  . Sexual activity: Not on file

## 2018-08-08 ENCOUNTER — Telehealth: Payer: Self-pay | Admitting: Orthopaedic Surgery

## 2018-08-08 NOTE — Telephone Encounter (Signed)
Pt called in said he needs a letter stating any restrictions he may have right not with work wise. 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   [phone]

## 2018-08-08 NOTE — Telephone Encounter (Signed)
Please advise 

## 2018-08-09 ENCOUNTER — Encounter (HOSPITAL_BASED_OUTPATIENT_CLINIC_OR_DEPARTMENT_OTHER): Payer: Self-pay

## 2018-08-09 ENCOUNTER — Other Ambulatory Visit: Payer: Self-pay

## 2018-08-09 NOTE — Telephone Encounter (Signed)
What is he having to do for work?

## 2018-08-09 NOTE — Telephone Encounter (Signed)
Ok to do until surgery

## 2018-08-09 NOTE — Telephone Encounter (Signed)
I spoke with patient. 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 He requests a note for sit down work.  Please advise.

## 2018-08-09 NOTE — Telephone Encounter (Signed)
Note entered and at front desk for pick up. Patient aware.

## 2018-08-10 ENCOUNTER — Other Ambulatory Visit (HOSPITAL_COMMUNITY)
Admission: RE | Admit: 2018-08-10 | Discharge: 2018-08-10 | Disposition: A | Discharge status: Home / Self Care | Payer: BC Managed Care – PPO | Source: Ambulatory Visit | Attending: Orthopaedic Surgery | Admitting: Orthopaedic Surgery

## 2018-08-10 DIAGNOSIS — Z20828 Contact with and (suspected) exposure to other viral communicable diseases: Secondary | ICD-10-CM | POA: Diagnosis not present

## 2018-08-10 DIAGNOSIS — Z01812 Encounter for preprocedural laboratory examination: Secondary | ICD-10-CM | POA: Diagnosis present

## 2018-08-10 LAB — SARS CORONAVIRUS 2 (TAT 6-24 HRS): SARS Coronavirus 2: NEGATIVE

## 2018-08-12 ENCOUNTER — Encounter (HOSPITAL_BASED_OUTPATIENT_CLINIC_OR_DEPARTMENT_OTHER)
Admission: RE | Admit: 2018-08-12 | Discharge: 2018-08-12 | Disposition: A | Discharge status: Home / Self Care | Payer: BC Managed Care – PPO | Source: Ambulatory Visit | Attending: Orthopaedic Surgery | Admitting: Orthopaedic Surgery

## 2018-08-12 ENCOUNTER — Other Ambulatory Visit: Payer: Self-pay

## 2018-08-12 DIAGNOSIS — Z01812 Encounter for preprocedural laboratory examination: Secondary | ICD-10-CM | POA: Insufficient documentation

## 2018-08-12 DIAGNOSIS — Z79899 Other long term (current) drug therapy: Secondary | ICD-10-CM | POA: Diagnosis not present

## 2018-08-12 DIAGNOSIS — M94262 Chondromalacia, left knee: Secondary | ICD-10-CM | POA: Diagnosis not present

## 2018-08-12 DIAGNOSIS — M65862 Other synovitis and tenosynovitis, left lower leg: Secondary | ICD-10-CM | POA: Diagnosis not present

## 2018-08-12 DIAGNOSIS — K219 Gastro-esophageal reflux disease without esophagitis: Secondary | ICD-10-CM | POA: Diagnosis not present

## 2018-08-12 DIAGNOSIS — S83242A Other tear of medial meniscus, current injury, left knee, initial encounter: Secondary | ICD-10-CM | POA: Diagnosis not present

## 2018-08-12 DIAGNOSIS — Z880 Allergy status to penicillin: Secondary | ICD-10-CM | POA: Diagnosis not present

## 2018-08-12 DIAGNOSIS — M1990 Unspecified osteoarthritis, unspecified site: Secondary | ICD-10-CM | POA: Diagnosis not present

## 2018-08-12 DIAGNOSIS — Z79891 Long term (current) use of opiate analgesic: Secondary | ICD-10-CM | POA: Diagnosis not present

## 2018-08-12 DIAGNOSIS — F17210 Nicotine dependence, cigarettes, uncomplicated: Secondary | ICD-10-CM | POA: Diagnosis not present

## 2018-08-12 DIAGNOSIS — Z888 Allergy status to other drugs, medicaments and biological substances status: Secondary | ICD-10-CM | POA: Diagnosis not present

## 2018-08-12 DIAGNOSIS — X58XXXA Exposure to other specified factors, initial encounter: Secondary | ICD-10-CM | POA: Diagnosis not present

## 2018-08-12 DIAGNOSIS — E119 Type 2 diabetes mellitus without complications: Secondary | ICD-10-CM | POA: Diagnosis not present

## 2018-08-12 LAB — BASIC METABOLIC PANEL
Anion gap: 6 (ref 5–15)
BUN: 12 mg/dL (ref 6–20)
CO2: 24 mmol/L (ref 22–32)
Calcium: 9.6 mg/dL (ref 8.9–10.3)
Chloride: 108 mmol/L (ref 98–111)
Creatinine, Ser: 1.13 mg/dL (ref 0.61–1.24)
GFR calc Af Amer: >60 mL/min (ref >60)
GFR calc non Af Amer: >60 mL/min (ref >60)
Glucose, Bld: 96 mg/dL (ref 70–99)
Potassium: 4.5 mmol/L (ref 3.5–5.1)
Sodium: 138 mmol/L (ref 135–145)

## 2018-08-12 NOTE — Progress Notes (Signed)

## 2018-08-13 ENCOUNTER — Encounter: Payer: Self-pay | Admitting: Adult Health

## 2018-08-14 ENCOUNTER — Encounter (HOSPITAL_BASED_OUTPATIENT_CLINIC_OR_DEPARTMENT_OTHER): Payer: Self-pay | Admitting: *Deleted

## 2018-08-14 ENCOUNTER — Ambulatory Visit (HOSPITAL_BASED_OUTPATIENT_CLINIC_OR_DEPARTMENT_OTHER): Payer: BC Managed Care – PPO | Admitting: Anesthesiology

## 2018-08-14 ENCOUNTER — Ambulatory Visit (HOSPITAL_BASED_OUTPATIENT_CLINIC_OR_DEPARTMENT_OTHER)
Admission: RE | Admit: 2018-08-14 | Discharge: 2018-08-14 | Disposition: A | Discharge status: Home / Self Care | Payer: BC Managed Care – PPO | Attending: Orthopaedic Surgery | Admitting: Orthopaedic Surgery

## 2018-08-14 ENCOUNTER — Other Ambulatory Visit: Payer: Self-pay

## 2018-08-14 ENCOUNTER — Encounter (HOSPITAL_BASED_OUTPATIENT_CLINIC_OR_DEPARTMENT_OTHER)
Admission: RE | Disposition: A | Discharge status: Home / Self Care | Payer: Self-pay | Source: Home / Self Care | Attending: Orthopaedic Surgery

## 2018-08-14 ENCOUNTER — Telehealth: Payer: Self-pay | Admitting: Orthopaedic Surgery

## 2018-08-14 DIAGNOSIS — Z79891 Long term (current) use of opiate analgesic: Secondary | ICD-10-CM | POA: Insufficient documentation

## 2018-08-14 DIAGNOSIS — M65862 Other synovitis and tenosynovitis, left lower leg: Secondary | ICD-10-CM | POA: Insufficient documentation

## 2018-08-14 DIAGNOSIS — Z880 Allergy status to penicillin: Secondary | ICD-10-CM | POA: Insufficient documentation

## 2018-08-14 DIAGNOSIS — M1990 Unspecified osteoarthritis, unspecified site: Secondary | ICD-10-CM | POA: Insufficient documentation

## 2018-08-14 DIAGNOSIS — Z888 Allergy status to other drugs, medicaments and biological substances status: Secondary | ICD-10-CM | POA: Insufficient documentation

## 2018-08-14 DIAGNOSIS — S83242A Other tear of medial meniscus, current injury, left knee, initial encounter: Secondary | ICD-10-CM | POA: Diagnosis not present

## 2018-08-14 DIAGNOSIS — M94262 Chondromalacia, left knee: Secondary | ICD-10-CM | POA: Insufficient documentation

## 2018-08-14 DIAGNOSIS — S83232A Complex tear of medial meniscus, current injury, left knee, initial encounter: Secondary | ICD-10-CM

## 2018-08-14 DIAGNOSIS — E119 Type 2 diabetes mellitus without complications: Secondary | ICD-10-CM | POA: Insufficient documentation

## 2018-08-14 DIAGNOSIS — Z79899 Other long term (current) drug therapy: Secondary | ICD-10-CM | POA: Insufficient documentation

## 2018-08-14 DIAGNOSIS — X58XXXA Exposure to other specified factors, initial encounter: Secondary | ICD-10-CM | POA: Insufficient documentation

## 2018-08-14 DIAGNOSIS — S83232D Complex tear of medial meniscus, current injury, left knee, subsequent encounter: Secondary | ICD-10-CM

## 2018-08-14 DIAGNOSIS — K219 Gastro-esophageal reflux disease without esophagitis: Secondary | ICD-10-CM | POA: Insufficient documentation

## 2018-08-14 DIAGNOSIS — F17210 Nicotine dependence, cigarettes, uncomplicated: Secondary | ICD-10-CM | POA: Insufficient documentation

## 2018-08-14 HISTORY — PX: KNEE ARTHROSCOPY WITH MEDIAL MENISECTOMY: SHX5651

## 2018-08-14 HISTORY — PX: CHONDROPLASTY: SHX5177

## 2018-08-14 LAB — GLUCOSE, CAPILLARY: Glucose-Capillary: 87 mg/dL (ref 70–99)

## 2018-08-14 SURGERY — ARTHROSCOPY, KNEE, WITH MEDIAL MENISCECTOMY
Anesthesia: General | Site: Knee | Laterality: Left

## 2018-08-14 MED ORDER — LACTATED RINGERS IV SOLN
INTRAVENOUS | Status: DC
  Administered 2018-08-14 (×2): via INTRAVENOUS

## 2018-08-14 MED ORDER — CHLORHEXIDINE GLUCONATE 4 % EX LIQD: 60.0000 mL | Freq: Once | CUTANEOUS | Status: DC

## 2018-08-14 MED ORDER — KETOROLAC TROMETHAMINE 30 MG/ML IJ SOLN
INTRAMUSCULAR | Status: AC
  Filled 2018-08-14: qty 1

## 2018-08-14 MED ORDER — ONDANSETRON HCL 4 MG/2ML IJ SOLN
INTRAMUSCULAR | Status: AC
  Filled 2018-08-14: qty 2

## 2018-08-14 MED ORDER — MIDAZOLAM HCL 2 MG/2ML IJ SOLN
INTRAMUSCULAR | Status: AC
  Filled 2018-08-14: qty 2

## 2018-08-14 MED ORDER — ONDANSETRON HCL 4 MG PO TABS: 4.0000 mg | ORAL_TABLET | Freq: Three times a day (TID) | ORAL | 0 refills | Status: DC | PRN

## 2018-08-14 MED ORDER — LIDOCAINE HCL (CARDIAC) PF 100 MG/5ML IV SOSY
PREFILLED_SYRINGE | INTRAVENOUS | Status: DC | PRN
  Administered 2018-08-14: 100 mg via INTRAVENOUS

## 2018-08-14 MED ORDER — PROMETHAZINE HCL 25 MG/ML IJ SOLN: 6.2500 mg | INTRAMUSCULAR | Status: DC | PRN

## 2018-08-14 MED ORDER — LACTATED RINGERS IV SOLN
INTRAVENOUS | Status: DC
  Administered 2018-08-14: 10:00:00 via INTRAVENOUS

## 2018-08-14 MED ORDER — LIDOCAINE 2% (20 MG/ML) 5 ML SYRINGE
INTRAMUSCULAR | Status: AC
  Filled 2018-08-14: qty 5

## 2018-08-14 MED ORDER — DEXAMETHASONE SODIUM PHOSPHATE 10 MG/ML IJ SOLN
INTRAMUSCULAR | Status: AC
  Filled 2018-08-14: qty 1

## 2018-08-14 MED ORDER — HYDROCODONE-ACETAMINOPHEN 5-325 MG PO TABS: 1.0000 | ORAL_TABLET | Freq: Three times a day (TID) | ORAL | 0 refills | Status: DC | PRN

## 2018-08-14 MED ORDER — PROPOFOL 10 MG/ML IV BOLUS
INTRAVENOUS | Status: AC
  Filled 2018-08-14: qty 40

## 2018-08-14 MED ORDER — DEXAMETHASONE SODIUM PHOSPHATE 10 MG/ML IJ SOLN
INTRAMUSCULAR | Status: DC | PRN
  Administered 2018-08-14: 10 mg via INTRAVENOUS

## 2018-08-14 MED ORDER — MIDAZOLAM HCL 2 MG/2ML IJ SOLN: 1.0000 mg | INTRAMUSCULAR | Status: DC | PRN

## 2018-08-14 MED ORDER — GLYCOPYRROLATE 0.2 MG/ML IJ SOLN
INTRAMUSCULAR | Status: DC | PRN
  Administered 2018-08-14: 0.2 mg via INTRAVENOUS

## 2018-08-14 MED ORDER — ACETAMINOPHEN 10 MG/ML IV SOLN
1000.0000 mg | Freq: Once | INTRAVENOUS | Status: DC | PRN
  Administered 2018-08-14: 1000 mg via INTRAVENOUS

## 2018-08-14 MED ORDER — ONDANSETRON HCL 4 MG/2ML IJ SOLN
INTRAMUSCULAR | Status: DC | PRN
  Administered 2018-08-14: 4 mg via INTRAVENOUS

## 2018-08-14 MED ORDER — FENTANYL CITRATE (PF) 100 MCG/2ML IJ SOLN
50.0000 ug | INTRAMUSCULAR | Status: DC | PRN
  Administered 2018-08-14: 100 ug via INTRAVENOUS

## 2018-08-14 MED ORDER — FENTANYL CITRATE (PF) 100 MCG/2ML IJ SOLN
INTRAMUSCULAR | Status: AC
  Filled 2018-08-14: qty 2

## 2018-08-14 MED ORDER — SODIUM CHLORIDE 0.9 % IR SOLN
Status: DC | PRN
  Administered 2018-08-14: 6000 mL

## 2018-08-14 MED ORDER — CLINDAMYCIN PHOSPHATE 900 MG/50ML IV SOLN
INTRAVENOUS | Status: AC
  Filled 2018-08-14: qty 50

## 2018-08-14 MED ORDER — PROPOFOL 10 MG/ML IV BOLUS
INTRAVENOUS | Status: DC | PRN
  Administered 2018-08-14: 200 mg via INTRAVENOUS

## 2018-08-14 MED ORDER — FENTANYL CITRATE (PF) 100 MCG/2ML IJ SOLN
25.0000 ug | INTRAMUSCULAR | Status: DC | PRN
  Administered 2018-08-14 (×2): 50 ug via INTRAVENOUS

## 2018-08-14 MED ORDER — ACETAMINOPHEN 10 MG/ML IV SOLN
INTRAVENOUS | Status: AC
  Filled 2018-08-14: qty 100

## 2018-08-14 MED ORDER — OXYCODONE HCL 5 MG/5ML PO SOLN: 5.0000 mg | Freq: Once | ORAL | Status: DC | PRN

## 2018-08-14 MED ORDER — KETOROLAC TROMETHAMINE 30 MG/ML IJ SOLN
30.0000 mg | Freq: Once | INTRAMUSCULAR | Status: DC | PRN
  Administered 2018-08-14: 30 mg via INTRAVENOUS

## 2018-08-14 MED ORDER — PHENYLEPHRINE HCL (PRESSORS) 10 MG/ML IV SOLN
INTRAVENOUS | Status: DC | PRN
  Administered 2018-08-14: 120 ug via INTRAVENOUS

## 2018-08-14 MED ORDER — OXYCODONE HCL 5 MG PO TABS: 5.0000 mg | ORAL_TABLET | Freq: Once | ORAL | Status: DC | PRN

## 2018-08-14 MED ORDER — CLINDAMYCIN PHOSPHATE 900 MG/50ML IV SOLN: 900.0000 mg | INTRAVENOUS | Status: DC

## 2018-08-14 SURGICAL SUPPLY — 41 items
BANDAGE ESMARK 6X9 LF (GAUZE/BANDAGES/DRESSINGS) IMPLANT
BLADE CUDA GRT WHITE 3.5 (BLADE) IMPLANT
BLADE CUDA SHAVER 3.5 (BLADE) IMPLANT
BLADE CUTTER GATOR 3.5 (BLADE) IMPLANT
BLADE GREAT WHITE 4.2 (BLADE) IMPLANT
BLADE LANZA CVD 15 DEG (BLADE) IMPLANT
BNDG ELASTIC 6X5.8 VLCR STR LF (GAUZE/BANDAGES/DRESSINGS) ×6 IMPLANT
BNDG ESMARK 6X9 LF (GAUZE/BANDAGES/DRESSINGS)
COVER WAND RF STERILE (DRAPES) IMPLANT
CUFF TOURN SGL QUICK 34 (TOURNIQUET CUFF) ×1
CUFF TRNQT CYL 34X4.125X (TOURNIQUET CUFF) ×2 IMPLANT
DRAPE ARTHROSCOPY W/POUCH 90 (DRAPES) ×3 IMPLANT
DRAPE IMP U-DRAPE 54X76 (DRAPES) ×3 IMPLANT
DRAPE U-SHAPE 47X51 STRL (DRAPES) ×3 IMPLANT
DURAPREP 26ML APPLICATOR (WOUND CARE) ×3 IMPLANT
GAUZE SPONGE 4X4 12PLY STRL (GAUZE/BANDAGES/DRESSINGS) ×3 IMPLANT
GAUZE XEROFORM 1X8 LF (GAUZE/BANDAGES/DRESSINGS) ×3 IMPLANT
GLOVE BIO SURGEON STRL SZ 6.5 (GLOVE) ×3 IMPLANT
GLOVE BIOGEL PI IND STRL 7.0 (GLOVE) ×2 IMPLANT
GLOVE BIOGEL PI IND STRL 8 (GLOVE) ×2 IMPLANT
GLOVE BIOGEL PI INDICATOR 7.0 (GLOVE) ×1
GLOVE BIOGEL PI INDICATOR 8 (GLOVE) ×1
GLOVE ECLIPSE 7.0 STRL STRAW (GLOVE) IMPLANT
GLOVE SKINSENSE NS SZ7.5 (GLOVE) ×1
GLOVE SKINSENSE STRL SZ7.5 (GLOVE) ×2 IMPLANT
GLOVE SURG SYN 7.5  E (GLOVE) ×2
GLOVE SURG SYN 7.5 E (GLOVE) ×4 IMPLANT
GOWN STRL REIN XL XLG (GOWN DISPOSABLE) ×3 IMPLANT
GOWN STRL REUS W/ TWL LRG LVL3 (GOWN DISPOSABLE) ×2 IMPLANT
GOWN STRL REUS W/ TWL XL LVL3 (GOWN DISPOSABLE) ×2 IMPLANT
GOWN STRL REUS W/TWL LRG LVL3 (GOWN DISPOSABLE) ×1
GOWN STRL REUS W/TWL XL LVL3 (GOWN DISPOSABLE) ×1
KNEE WRAP E Z 3 GEL PACK (MISCELLANEOUS) ×3 IMPLANT
MANIFOLD NEPTUNE II (INSTRUMENTS) ×3 IMPLANT
PACK ARTHROSCOPY DSU (CUSTOM PROCEDURE TRAY) ×3 IMPLANT
PACK BASIN DAY SURGERY FS (CUSTOM PROCEDURE TRAY) ×3 IMPLANT
RESECTOR FULL RADIUS 4.2MM (BLADE) IMPLANT
SHAVER 4.2 MM LANZA 9391A (BLADE) ×3 IMPLANT
SUT ETHILON 3 0 PS 1 (SUTURE) ×3 IMPLANT
TOWEL GREEN STERILE FF (TOWEL DISPOSABLE) ×3 IMPLANT
TUBING ARTHRO INFLOW-ONLY STRL (TUBING) ×3 IMPLANT

## 2018-08-14 NOTE — Anesthesia Postprocedure Evaluation (Signed)
Anesthesia Post Note  Patient: Glass blower/designer) Performed: LEFT KNEE ARTHROSCOPY WITH PARTIAL MEDIAL MENISCECTOMY (Left Knee) CHONDROPLASTY (Knee)     Patient location during evaluation: PACU Anesthesia Type: General Level of consciousness: awake and alert Pain management: pain level controlled Vital Signs Assessment: post-procedure vital signs reviewed and stable Respiratory status: spontaneous breathing, nonlabored ventilation and respiratory function stable Cardiovascular status: blood pressure returned to baseline and stable Postop Assessment: no apparent nausea or vomiting Anesthetic complications: no    Last Vitals:  Vitals:   08/14/18 1131 08/14/18 1145  BP: (!) 125/92 (!) 129/93  Pulse: 92 87  Resp: 17 11  Temp:    SpO2: 100% 100%       Audry Pili

## 2018-08-14 NOTE — Op Note (Signed)
° °  Surgery Date: 08/14/2018  Surgeon(s): Leandrew Koyanagi, MD  ASSIST: none  ANESTHESIA:  general  FLUIDS: Per anesthesia record.   ESTIMATED BLOOD LOSS: minimal  PREOPERATIVE DIAGNOSES:  1. Left knee medial meniscus tear 2. Left knee synovitis 3. Left knee chondromalacia  POSTOPERATIVE DIAGNOSES:  same  PROCEDURES PERFORMED:  1. Left knee arthroscopy with major synovectomy 2. Left knee arthroscopy with arthroscopic partial medial meniscectomy 3. Left knee arthroscopy with arthroscopic chondroplasty medial femoral condyle and femoral trochlea.  DESCRIPTION OF PROCEDURE: Brett [name] is a 58 y.o.-year-old male with left knee medial meniscus tear. Plans are to proceed with partial medial meniscectomy and diagnostic arthroscopy with debridement as indicated. Full discussion held regarding risks benefits alternatives and complications related surgical intervention. Conservative care options reviewed. All questions answered.  The patient was identified in the preoperative holding area and the operative extremity was marked. The patient was brought to the operating room and transferred to operating table in a supine position. Satisfactory general anesthesia was induced by anesthesiology.    Standard anterolateral, anteromedial arthroscopy portals were obtained. The anteromedial portal was obtained with a spinal needle for localization under direct visualization with subsequent diagnostic findings.   Incisions were made for arthroscopy portals.  Diagnostic arthroscopy along with a major synovectomy was first performed using oscillating shaver.  We then turned our attention to the medial compartment which showed widespread grade III chondromalacia of the weightbearing surface of the medial femoral condyle.  Chondroplasty was performed for the unstable cartilage back to a smooth and stable border.  We then placed the knee in a valgus position in order to address the medial meniscal tear.  Probing  of the meniscus found that approximately 75% of the posterior horn was severely torn and displaced.  Partial medial meniscectomy was then performed using oscillating shaver and a meniscus basket back to stable rim.  The meniscal root was intact.  The cruciates were intact.  Lateral compartment was unremarkable.  Patellofemoral compartment showed a central strip down the trochlea which exhibited grade III chondromalacia.  No loose bodies were encountered.  Excess fluid was removed from the knee joint.  Local anesthetic was placed in the portal sites and the knee joint.  Incisions were closed with interrupted nylon sutures.  Sterile dressings were applied.  Patient tolerated procedure well and any complications.  Suprapatellar pouch and gutters: moderate synovitis or debris. Patella chondral surface: Grade 1 Trochlear chondral surface: Grade 3 Patellofemoral tracking: normal Medial meniscus: complex tear of posterior horn 75%.  Medial femoral condyle weight bearing surface: Grade 3 Medial tibial plateau: Grade 2 Anterior cruciate ligament:stable Posterior cruciate ligament:stable Lateral meniscus: normal.   Lateral femoral condyle weight bearing surface: Grade 0 Lateral tibial plateau: Grade 0  DISPOSITION: The patient was awakened from general anesthetic, extubated, taken to the recovery room in medically stable condition, no apparent complications. The patient may be weightbearing as tolerated to the operative lower extremity.  Range of motion of right knee as tolerated.  Azucena Cecil, MD [phone] 11:02 AM

## 2018-08-14 NOTE — Discharge Instructions (Signed)
Post-operative patient instructions  Knee Arthroscopy                                **No Tylenol or Ibuprofen until 6:00pm if needed**   Ice:  Place intermittent ice or cooler pack over your knee, 30 minutes on and 30 minutes off.  Continue this for the first 72 hours after surgery, then save ice for use after therapy sessions or on more active days.    Weight:  You may bear weight on your leg as your symptoms allow.  Crutches:  Use crutches (or walker) to assist in walking until told to discontinue by your physical therapist or physician. This will help to reduce pain.  Strengthening:  Perform simple thigh squeezes (isometric quad contractions) and straight leg lifts as you are able (3 sets of 5 to 10 repetitions, 3 times a day).  For the leg lifts, have someone support under your ankle in the beginning until you have increased strength enough to do this on your own.  To help get started on thigh squeezes, place a pillow under your knee and push down on the pillow with back of knee (sometimes easier to do than with your leg fully straight).  Motion:  Perform gentle knee motion as tolerated - this is gentle bending and straightening of the knee. Seated heel slides: you can start by sitting in a chair, remove your brace, and gently slide your heel back on the floor - allowing your knee to bend. Have someone help you straighten your knee (or use your other leg/foot hooked under your ankle.   Dressing:  Perform 1st dressing change at 2 days postoperative. A moderate amount of blood tinged drainage is to be expected.  So if you bleed through the dressing on the first or second day or if you have fevers, it is fine to change the dressing/check the wounds early and redress wound. Elevate your leg.  If it bleeds through again, or if the incisions are leaking frank blood, please call the office. May change dressing every 1-2 days thereafter to help watch wounds. Can purchase Tegaderm (or 30M Nexcare)  water resistant dressings at local pharmacy / [facility].  Shower:  Light shower is ok after 2 days.  Please take shower, NO bath. Recover with gauze and ace wrap to help keep wounds protected.    Pain medication:  A narcotic pain medication has been prescribed.  Take as directed.  Typically you need narcotic pain medication more regularly during the first 3 to 5 days after surgery.  Decrease your use of the medication as the pain improves.  Narcotics can sometimes cause constipation, even after a few doses.  If you have problems with constipation, you can take an over the counter stool softener or light laxative.  If you have persistent problems, please notify your physicians office.  Physical therapy: Additional activity guidelines to be provided by your physician or physical therapist at follow-up visits.   Driving: Do not recommend driving x 2 weeks post surgical, especially if surgery performed on right side. Should not drive while taking narcotic pain medications. It typically takes at least 2 weeks to restore sufficient neuromuscular function for normal reaction times for driving safety.   Call [phone] for questions or problems. Evenings you will be forwarded to the hospital operator.  Ask for the orthopaedic physician on call. Please call if you experience:    o  Redness, foul smelling, or persistent drainage from the surgical site  o worsening knee pain and swelling not responsive to medication  o any calf pain and or swelling of the lower leg  o temperatures greater than 101.5 F o other questions or concerns   Thank you for allowing [place] to be a part of your care.   Post Anesthesia Home Care Instructions  Activity: Get plenty of rest for the remainder of the day. A responsible individual must stay with you for 24 hours following the procedure.  For the next 24 hours, DO NOT: -Drive a car -Paediatric nurse -Drink alcoholic beverages -Take any medication unless instructed by  your physician -Make any legal decisions or sign important papers.  Meals: Start with liquid foods such as gelatin or soup. Progress to regular foods as tolerated. Avoid greasy, spicy, heavy foods. If nausea and/or vomiting occur, drink only clear liquids until the nausea and/or vomiting subsides. Call your physician if vomiting continues.  Special Instructions/Symptoms: Your throat may feel dry or sore from the anesthesia or the breathing tube placed in your throat during surgery. If this causes discomfort, gargle with warm salt water. The discomfort should disappear within 24 hours.  If you had a scopolamine patch placed behind your ear for the management of post- operative nausea and/or vomiting:  1. The medication in the patch is effective for 72 hours, after which it should be removed.  Wrap patch in a tissue and discard in the trash. Wash hands thoroughly with soap and water. 2. You may remove the patch earlier than 72 hours if you experience unpleasant side effects which may include dry mouth, dizziness or visual disturbances. 3. Avoid touching the patch. Wash your hands with soap and water after contact with the patch.

## 2018-08-14 NOTE — Anesthesia Preprocedure Evaluation (Addendum)
Anesthesia Evaluation  Patient identified by MRN, date of birth, ID band Patient awake    Reviewed: Allergy & Precautions, NPO status , Patient's Chart, lab work & pertinent test results  History of Anesthesia Complications Negative for: history of anesthetic complications  Airway Mallampati: II  TM Distance: >3 FB Neck ROM: Full    Dental  (+) Dental Advisory Given, Teeth Intact   Pulmonary Current Smoker and Patient abstained from smoking.,    Pulmonary exam normal        Cardiovascular (-) anginanegative cardio ROS Normal cardiovascular exam     Neuro/Psych negative neurological ROS  negative psych ROS   GI/Hepatic Neg liver ROS, GERD  Medicated and Controlled,  Endo/Other  diabetes Pre-DM   Renal/GU negative Renal ROS     Musculoskeletal  (+) Arthritis ,   Abdominal   Peds  Hematology negative hematology ROS (+)   Anesthesia Other Findings   Reproductive/Obstetrics                            Anesthesia Physical Anesthesia Plan  ASA: II  Anesthesia Plan: General   Post-op Pain Management:    Induction: Intravenous  PONV Risk Score and Plan: 2 and Treatment may vary due to age or medical condition, Ondansetron and Dexamethasone  Airway Management Planned: LMA  Additional Equipment: None  Intra-op Plan:   Post-operative Plan: Extubation in OR  Informed Consent: I have reviewed the patients History and Physical, chart, labs and discussed the procedure including the risks, benefits and alternatives for the proposed anesthesia with the patient or authorized representative who has indicated his/her understanding and acceptance.     Dental advisory given  Plan Discussed with: CRNA and Anesthesiologist  Anesthesia Plan Comments:        Anesthesia Quick Evaluation

## 2018-08-14 NOTE — Telephone Encounter (Signed)
Patient's wife called requesting an RX for a walker and also the surgical center does not have ice packs to give the patients to take home after surgery, she is wanting to know if we have one that she could come get for him.  CB#[phone].  Thank you.

## 2018-08-14 NOTE — Anesthesia Procedure Notes (Signed)
Procedure Name: LMA Insertion Performed by: Mylo Choi M, CRNA Pre-anesthesia Checklist: Patient identified, Emergency Drugs available, Suction available, Patient being monitored and Timeout performed Patient Re-evaluated:Patient Re-evaluated prior to induction Oxygen Delivery Method: Circle system utilized Preoxygenation: Pre-oxygenation with 100% oxygen Induction Type: IV induction LMA: LMA inserted LMA Size: 4.0 Tube type: Oral Number of attempts: 1 Placement Confirmation: CO2 detector,  positive ETCO2 and breath sounds checked- equal and bilateral Tube secured with: Tape Dental Injury: Teeth and Oropharynx as per pre-operative assessment        

## 2018-08-14 NOTE — Transfer of Care (Signed)
Immediate Anesthesia Transfer of Care Note  Patient: Brett [name]  Procedure(s) Performed: LEFT KNEE ARTHROSCOPY WITH PARTIAL MEDIAL MENISCECTOMY (Left Knee) CHONDROPLASTY (Knee)  Patient Location: PACU  Anesthesia Type:General  Level of Consciousness: awake, alert  and oriented  Airway & Oxygen Therapy: Patient Spontanous Breathing and Patient connected to face mask oxygen  Post-op Assessment: Report given to RN and Post -op Vital signs reviewed and stable  Post vital signs: Reviewed and stable  Last Vitals:  Vitals Value Taken Time  BP    Temp    Pulse 120 08/14/18 1106  Resp    SpO2 98 % 08/14/18 1106  Vitals shown include unvalidated device data.  Last Pain:  Vitals:   08/14/18 0923  TempSrc: Oral  PainSc: 8       Patients Stated Pain Goal: 3 (10/27/83 2778)  Complications: No apparent anesthesia complications

## 2018-08-14 NOTE — H&P (Signed)
PREOPERATIVE H&P  Chief Complaint: left knee medial meniscal tear  HPI: Brett [name] is a 58 y.o. male who presents for surgical treatment of left knee medial meniscal tear.  He denies any changes in medical history.  Past Medical History:  Diagnosis Date  . Anal fissure   . Arthritis   . Colon polyps 01/13/11   Colonoscopy  . Diabetes mellitus    pre-diabetic, no meds needed  . GERD (gastroesophageal reflux disease)    Past Surgical History:  Procedure Laterality Date  . ANAL FISSURE REPAIR  02/24/11  . COLONOSCOPY     Social History   Socioeconomic History  . Marital status: Married    Spouse name: Not on file  . Number of children: 2  . Years of education: Not on file  . Highest education level: Not on file  Occupational History    [employer]er: [employer]  Social Needs  . Financial resource strain: Not on file  . Food insecurity    Worry: Not on file    Inability: Not on file  . Transportation needs    Medical: Not on file    Non-medical: Not on file  Tobacco Use  . Smoking status: Current Some Day Smoker    Packs/day: 0.00    Years: 27.00    Pack years: 0.00    Types: Cigarettes  . Smokeless tobacco: Never Used  . Tobacco comment: 1-2 every other day  Substance and Sexual Activity  . Alcohol use: Yes    Comment: socially  . Drug use: No  . Sexual activity: Not on file  Lifestyle  . Physical activity    Days per week: Not on file    Minutes per session: Not on file  . Stress: Not on file  Relationships  . Social Musicianconnections    Talks on phone: Not on file    Gets together: Not on file    Attends religious service: Not on file    Active member of club or organization: Not on file    Attends meetings of clubs or organizations: Not on file    Relationship status: Not on file  Other Topics Concern  . Not on file  Social History Narrative  . Not on file   Family History  Problem Relation Age of Onset  . Rheum arthritis Mother   . Allergies  Daughter   . Allergies Sister   . Asthma Daughter    Allergies  Allergen Reactions  . Benzodiazepines Hives  . Chantix  [Varenicline] Hives  . Cialis [Tadalafil]     Hives  . Dicyclomine Hives  . Meloxicam     Rash  . Penicillins Hives    DID THE REACTION INVOLVE: Swelling of the face/tongue/throat, SOB, or low BP? Unknown Sudden or severe rash/hives, skin peeling, or the inside of the mouth or nose? Unknown Did it require medical treatment? n  When did it last happen?Over 5 years ago. If all above answers are "NO", may proceed with cephalosporin use.   . Relafen [Nabumetone]     Hives   Prior to Admission medications   Medication Sig Start Date End Date Taking? Authorizing Provider  acetaminophen-codeine (TYLENOL #3) 300-30 MG tablet TAKE 1 TABLET BY MOUTH EVERY 6 HOURS AS NEEDED FOR PAIN 07/23/18  Yes Cristie HemStanbery, Mary L, PA-C  albuterol (PROVENTIL HFA;VENTOLIN HFA) 108 (90 Base) MCG/ACT inhaler Inhale 1-2 puffs into the lungs every 6 (six) hours as needed for wheezing or shortness of breath. 06/29/16  Yes  Lysbeth Penner, FNP  azelastine (ASTELIN) 0.1 % nasal spray Place 2 sprays into both nostrils 2 (two) times daily. Use in each nostril as directed 04/18/17  Yes [name], PA-C  cholecalciferol (VITAMIN D) 1000 UNITS tablet Take 1,000 Units by mouth daily.   Yes [provider]  GOLDENSEAL PO Take 2 capsules by mouth 3 (three) times daily.   Yes [provider]  ibuprofen (ADVIL,MOTRIN) 600 MG tablet Take 1 tablet (600 mg total) by mouth every 6 (six) hours as needed. 03/23/17  Yes Neese, Bethany, NP  naproxen sodium (ALEVE) 220 MG tablet Take 440-660 mg by mouth 2 (two) times daily with a meal.   Yes [provider]  omeprazole (PRILOSEC) 20 MG capsule Take 1 capsule (20 mg total) by mouth daily. 08/14/16  Yes Lysbeth Penner, FNP  vitamin E 400 UNIT capsule Take 400 Units by mouth daily.   Yes [provider]  glucose blood test strip  Use as instructed 03/23/14   Rolene Course, PA-C  traMADol Veatrice Bourbon) 50 MG tablet 1/2-1 tablet BID for pain 06/12/18   [name], PA-C  traMADol (ULTRAM) 50 MG tablet Take 1 tablet (50 mg total) by mouth every 8 (eight) hours as needed for moderate pain. 06/28/18   Aundra Dubin, PA-C     Positive ROS: All other systems have been reviewed and were otherwise negative with the exception of those mentioned in the HPI and as above.  Physical Exam: General: Alert, no acute distress Cardiovascular: No pedal edema Respiratory: No cyanosis, no use of accessory musculature GI: abdomen soft Skin: No lesions in the area of chief complaint Neurologic: Sensation intact distally Psychiatric: Patient is competent for consent with normal mood and affect Lymphatic: no lymphedema  MUSCULOSKELETAL: exam stable  Assessment: left knee medial meniscal tear  Plan: Plan for Procedure(s): LEFT KNEE ARTHROSCOPY WITH PARTIAL MEDIAL MENISCECTOMY  The risks benefits and alternatives were discussed with the patient including but not limited to the risks of nonoperative treatment, versus surgical intervention including infection, bleeding, nerve injury,  blood clots, cardiopulmonary complications, morbidity, mortality, among others, and they were willing to proceed.   Eduard Roux, MD   08/14/2018 8:01 AM

## 2018-08-14 NOTE — Telephone Encounter (Signed)
Please advise on message below. Thank you!

## 2018-08-14 NOTE — Telephone Encounter (Signed)
He definitely got an ice pack for his knee because I put it on him after surgery.  Yes to the walker.

## 2018-08-15 NOTE — Telephone Encounter (Signed)
Talked with patient's wife and advised her of Dr. Phoebe Sharps message below. Patient voiced that she understands and will come by the office to pick up Rx.

## 2018-08-16 ENCOUNTER — Encounter (HOSPITAL_BASED_OUTPATIENT_CLINIC_OR_DEPARTMENT_OTHER): Payer: Self-pay | Admitting: Orthopaedic Surgery

## 2018-08-19 NOTE — Progress Notes (Addendum)
Patient ID: Brett [name], male   DOB: 07/25/1960, 58 y.o.   MRN: [id number]  Complete Physical  Assessment and Plan:  Encounter for general adult medical examination with abnormal findings 1 year  Hyperlipidemia, unspecified hyperlipidemia type -     Lipid panel check lipids decrease fatty foods increase activity.   Abnormal glucose -     Hemoglobin A1c - given accucheck meter Discussed disease progression and risks Discussed diet/exercise, weight management and risk modification  Overweight (BMI 25.0-29.9) - long discussion about weight loss, diet, and exercise -recommended diet heavy in fruits and veggies and low in animal meats, cheeses, and dairy products  Vitamin D deficiency -     VITAMIN D 25 Hydroxy (Vit-D Deficiency, Fractures)  Medication management -     CBC with Differential/Platelet -     COMPLETE METABOLIC PANEL WITH GFR -     TSH -     Magnesium  Gastroesophageal reflux disease, esophagitis presence not specified Get back on prilosec, avoid NSAIDS  Hypertension, unspecified type - continue medications, DASH diet, exercise and monitor at home. Call if greater than 130/80.  -     Urinalysis, Routine w reflex microscopic -     Microalbumin / creatinine urine ratio -     EKG 12-Lead  Screening for viral disease -     HIV Antibody (routine testing w rflx) -     Hepatitis C antibody  Anemia, unspecified type -     Iron,Total/Total Iron Binding Cap -     Vitamin B12  Cervical adenopathy on the RIGHT ear Declines US at this time, but has been 4 years, no change, non tender.    Discussed med's effects and SE's. Screening labs and tests as requested with regular follow-up as recommended.  HPI Patient presents for a complete physical.   He is a smoker, goes off and on, started when he was 16, stopped in 30's and then off and on. Last CXR 2019.   He states for 4 years he has had a bump behind his ADDENDUM RIGHT ear, not getting larger, no pain or  discomfort with it.   S/p left knee arthroscopy and medal menisectomy with Dr. Roda ShuttersXu on 08/14/2018. He continues to have pain, states since he is putting more pressure on it. Has appointment today.   BMI is Body mass index is 26.95 kg/m., he is working on diet and exercise. Wt Readings from Last 3 Encounters:  08/21/18 167 lb (75.8 kg)  08/14/18 167 lb 8.8 oz (76 kg)  12/25/17 167 lb (75.8 kg)     He is not on cholesterol medication and denies myalgias. His cholesterol is at goal. The cholesterol last visit was:   Lab Results  Component Value Date   CHOL 165 03/23/2014   HDL 63 03/23/2014   LDLCALC 90 03/23/2014   TRIG 61 03/23/2014   CHOLHDL 2.6 03/23/2014    He has been working on diet and exercise for prediabetes, he is not on bASA, he is not on ACE/ARB and denies foot ulcerations, nausea, paresthesia of the feet, polydipsia, polyuria, visual disturbances, vomiting and weight loss. Last A1C in the office was:  Lab Results  Component Value Date   HGBA1C 6.1 (H) 03/23/2014    Patient is on Vitamin D supplement, does 2000 IU a day.    Lab Results  Component Value Date   VD25OH 30 03/23/2014    Last PSA was: Lab Results  Component Value Date   PSA 1.23 03/23/2014  Denies BPH symptoms daytime frequency, double voiding, dysuria, hematuria, hesitancy, incontinence, intermittency, nocturia, sensation of incomplete bladder emptying, suprapubic pain, urgency or weak urinary stream.     Current Medications:  Current Outpatient Medications on File Prior to Visit  Medication Sig Dispense Refill  . acetaminophen-codeine (TYLENOL #3) 300-30 MG tablet TAKE 1 TABLET BY MOUTH EVERY 6 HOURS AS NEEDED FOR PAIN 20 tablet 0  . albuterol (PROVENTIL HFA;VENTOLIN HFA) 108 (90 Base) MCG/ACT inhaler Inhale 1-2 puffs into the lungs every 6 (six) hours as needed for wheezing or shortness of breath. 1 Inhaler 1  . azelastine (ASTELIN) 0.1 % nasal spray Place 2 sprays into both nostrils 2 (two) times  daily. Use in each nostril as directed 30 mL 2  . cholecalciferol (VITAMIN D) 1000 UNITS tablet Take 1,000 Units by mouth daily.    Marland Kitchen. glucose blood test strip Use as instructed 100 each 0  . GOLDENSEAL PO Take 2 capsules by mouth 3 (three) times daily.    Marland Kitchen. HYDROcodone-acetaminophen (NORCO) 5-325 MG tablet Take 1-2 tablets by mouth 3 (three) times daily as needed. 30 tablet 0  . ibuprofen (ADVIL,MOTRIN) 600 MG tablet Take 1 tablet (600 mg total) by mouth every 6 (six) hours as needed. 20 tablet 0  . naproxen sodium (ALEVE) 220 MG tablet Take 440-660 mg by mouth 2 (two) times daily with a meal.    . omeprazole (PRILOSEC) 20 MG capsule Take 1 capsule (20 mg total) by mouth daily. 30 capsule 0  . ondansetron (ZOFRAN) 4 MG tablet Take 1-2 tablets (4-8 mg total) by mouth every 8 (eight) hours as needed for nausea or vomiting. 40 tablet 0  . traMADol (ULTRAM) 50 MG tablet 1/2-1 tablet BID for pain 10 tablet 0  . traMADol (ULTRAM) 50 MG tablet Take 1 tablet (50 mg total) by mouth every 8 (eight) hours as needed for moderate pain. 20 tablet 0  . vitamin E 400 UNIT capsule Take 400 Units by mouth daily.     No current facility-administered medications on file prior to visit.     Health Maintenance:  Immunization History  Administered Date(s) Administered  . Influenza Inj Mdck Quad With Preservative 10/18/2017  . Influenza Whole 10/31/2006, 11/03/2010, 11/02/2011  . Influenza-Unspecified 01/03/2012  . PPD Test 03/17/2013  . Pneumococcal-Unspecified 10/13/2009  . Td 01/03/2008  . Tdap 01/03/2011   Colonoscopy 10/2011 CT head 2008  Patient Care Team: Lucky CowboyMcKeown, William, MD as PCP - General (Internal Medicine) Waymon BudgeYoung, Clinton D, MD as Consulting Physician (Pulmonary Disease) Charlott RakesSchooler, Vincent, MD as Consulting Physician (Gastroenterology) Joelyn OmsMcGinley, Scott M, MD as Referring Physician (Orthopedic Surgery)  Allergies:  Allergies  Allergen Reactions  . Benzodiazepines Hives  . Chantix   [Varenicline] Hives  . Cialis [Tadalafil]     Hives  . Dicyclomine Hives  . Meloxicam     Rash  . Penicillins Hives    DID THE REACTION INVOLVE: Swelling of the face/tongue/throat, SOB, or low BP? Unknown Sudden or severe rash/hives, skin peeling, or the inside of the mouth or nose? Unknown Did it require medical treatment? n  When did it last happen?Over 5 years ago. If all above answers are "NO", may proceed with cephalosporin use.   [place] Ana. Relafen [Nabumetone]     Hives    Medical History:  Past Medical History:  Diagnosis Date  . Anal fissure   . Arthritis   . Colon polyps 01/13/11   Colonoscopy  . Diabetes mellitus    pre-diabetic, no meds needed  .  GERD (gastroesophageal reflux disease)     Surgical History:  Past Surgical History:  Procedure Laterality Date  . ANAL FISSURE REPAIR  02/24/11  . CHONDROPLASTY  08/14/2018   Procedure: CHONDROPLASTY;  Surgeon: Leandrew Koyanagi, MD;  Location: [place];  Service: Orthopedics;;  . COLONOSCOPY    . KNEE ARTHROSCOPY WITH MEDIAL MENISECTOMY Left 08/14/2018   Procedure: LEFT KNEE ARTHROSCOPY WITH PARTIAL MEDIAL MENISCECTOMY;  Surgeon: Leandrew Koyanagi, MD;  Location: [place];  Service: Orthopedics;  Laterality: Left;    Family History:  Family History  Problem Relation Age of Onset  . Rheum arthritis Mother   . Allergies Daughter   . Allergies Sister   . Asthma Daughter     Social History:   Social History   Tobacco Use  . Smoking status: Current Some Day Smoker    Packs/day: 0.00    Years: 27.00    Pack years: 0.00    Types: Cigarettes  . Smokeless tobacco: Never Used  . Tobacco comment: 1-2 every other day  Substance Use Topics  . Alcohol use: Yes    Comment: socially  . Drug use: No    Review of Systems:  Review of Systems  Constitutional: Negative for chills, fever and weight loss.  HENT: Negative for congestion, ear pain, sore throat and tinnitus.   Eyes: Negative.    Respiratory: Negative for cough, hemoptysis, sputum production, shortness of breath and wheezing.   Cardiovascular: Negative for chest pain, palpitations and leg swelling.  Gastrointestinal: Positive for heartburn. Negative for abdominal pain, blood in stool, constipation, diarrhea, melena, nausea and vomiting.  Genitourinary: Negative for dysuria, flank pain, frequency, hematuria and urgency.  Musculoskeletal: Positive for joint pain and myalgias.  Skin: Negative.   Neurological: Negative for dizziness, focal weakness and headaches.    Physical Exam: Estimated body mass index is 27.88 kg/m as calculated from the following:   Height as of 08/14/18: 5\' 5"  (1.651 m).   Weight as of 08/14/18: 167 lb 8.8 oz (76 kg). There were no vitals taken for this visit.  General Appearance: Well nourished, in no apparent distress.  Eyes: PERRLA, EOMs, conjunctiva no swelling or erythema ENT/Mouth: Ear canals clear bilaterally with no erythema, swelling, discharge.  TMs normal bilaterally with no erythema, bulging, or retractions.  Oropharynx clear and moist with no exudate, swelling, or erythema.  Dentition normal.   Neck: Right cervical adenopathy near mastoid/year.  Supple, thyroid normal. No bruits, JVD, cervical adenopathy Respiratory: Respiratory effort normal, BS equal bilaterally without rales, rhonchi, wheezing or stridor.  Cardio: RRR without murmurs, rubs or gallops. Brisk peripheral pulses without edema.  Chest: symmetric, with normal excursions Abdomen: Soft, nontender, no guarding, rebound, hernias, masses, or organomegaly. Musculoskeletal: Full ROM all peripheral extremities,5/5 strength, and antalgic gait, left knee in wrap, patient walking with crutches Skin: Warm, dry without rashes, lesions, ecchymosis. Neuro: A&Ox3, Cranial nerves intact, reflexes equal bilaterally. Normal muscle tone, no cerebellar symptoms. Sensation intact.  Psych: Normal affect, Insight and Judgment appropriate.    EKG: WNL no changes.  AORTA SCAN: defer  [name] 1:38 PM [facility]

## 2018-08-21 ENCOUNTER — Encounter: Payer: Self-pay | Admitting: Orthopaedic Surgery

## 2018-08-21 ENCOUNTER — Other Ambulatory Visit: Payer: Self-pay

## 2018-08-21 ENCOUNTER — Ambulatory Visit (INDEPENDENT_AMBULATORY_CARE_PROVIDER_SITE_OTHER): Payer: BC Managed Care – PPO | Admitting: Orthopaedic Surgery

## 2018-08-21 ENCOUNTER — Ambulatory Visit (INDEPENDENT_AMBULATORY_CARE_PROVIDER_SITE_OTHER): Payer: BC Managed Care – PPO | Admitting: Physician Assistant

## 2018-08-21 ENCOUNTER — Encounter: Payer: Self-pay | Admitting: Physician Assistant

## 2018-08-21 VITALS — Ht 66.0 in | Wt 167.0 lb

## 2018-08-21 VITALS — BP 126/72 | HR 87 | Temp 97.2°F | Ht 66.0 in | Wt 167.0 lb

## 2018-08-21 DIAGNOSIS — E785 Hyperlipidemia, unspecified: Secondary | ICD-10-CM

## 2018-08-21 DIAGNOSIS — Z79899 Other long term (current) drug therapy: Secondary | ICD-10-CM | POA: Diagnosis not present

## 2018-08-21 DIAGNOSIS — Z130 Encounter for screening for diseases of the blood and blood-forming organs and certain disorders involving the immune mechanism: Secondary | ICD-10-CM

## 2018-08-21 DIAGNOSIS — Z1389 Encounter for screening for other disorder: Secondary | ICD-10-CM | POA: Diagnosis not present

## 2018-08-21 DIAGNOSIS — R7989 Other specified abnormal findings of blood chemistry: Secondary | ICD-10-CM

## 2018-08-21 DIAGNOSIS — K219 Gastro-esophageal reflux disease without esophagitis: Secondary | ICD-10-CM

## 2018-08-21 DIAGNOSIS — Z1329 Encounter for screening for other suspected endocrine disorder: Secondary | ICD-10-CM | POA: Diagnosis not present

## 2018-08-21 DIAGNOSIS — D649 Anemia, unspecified: Secondary | ICD-10-CM

## 2018-08-21 DIAGNOSIS — Z0000 Encounter for general adult medical examination without abnormal findings: Secondary | ICD-10-CM

## 2018-08-21 DIAGNOSIS — I10 Essential (primary) hypertension: Secondary | ICD-10-CM | POA: Diagnosis not present

## 2018-08-21 DIAGNOSIS — Z13220 Encounter for screening for lipoid disorders: Secondary | ICD-10-CM

## 2018-08-21 DIAGNOSIS — Z131 Encounter for screening for diabetes mellitus: Secondary | ICD-10-CM

## 2018-08-21 DIAGNOSIS — Z1159 Encounter for screening for other viral diseases: Secondary | ICD-10-CM | POA: Diagnosis not present

## 2018-08-21 DIAGNOSIS — Z136 Encounter for screening for cardiovascular disorders: Secondary | ICD-10-CM

## 2018-08-21 DIAGNOSIS — E559 Vitamin D deficiency, unspecified: Secondary | ICD-10-CM | POA: Diagnosis not present

## 2018-08-21 DIAGNOSIS — Z0001 Encounter for general adult medical examination with abnormal findings: Secondary | ICD-10-CM

## 2018-08-21 DIAGNOSIS — R590 Localized enlarged lymph nodes: Secondary | ICD-10-CM

## 2018-08-21 DIAGNOSIS — R7309 Other abnormal glucose: Secondary | ICD-10-CM

## 2018-08-21 DIAGNOSIS — E663 Overweight: Secondary | ICD-10-CM

## 2018-08-21 DIAGNOSIS — Z9889 Other specified postprocedural states: Secondary | ICD-10-CM

## 2018-08-21 DIAGNOSIS — S83232D Complex tear of medial meniscus, current injury, left knee, subsequent encounter: Secondary | ICD-10-CM

## 2018-08-21 MED ORDER — HYDROCODONE-ACETAMINOPHEN 5-325 MG PO TABS: 1.0000 | ORAL_TABLET | Freq: Every day | ORAL | 0 refills | Status: DC | PRN

## 2018-08-21 MED ORDER — OMEPRAZOLE 40 MG PO CPDR: 40.0000 mg | DELAYED_RELEASE_CAPSULE | Freq: Every day | ORAL | 1 refills | Status: DC

## 2018-08-21 NOTE — Patient Instructions (Addendum)
Call or message and we can set up the ultrasound for your left neck/ear    Avoid alcohol, spicy foods, NSAIDS (aleve, ibuprofen) at this time. See foods below.   Food Choices for Gastroesophageal Reflux Disease When you have gastroesophageal reflux disease (GERD), the foods you eat and your eating habits are very important. Choosing the right foods can help ease the discomfort of GERD. WHAT GENERAL GUIDELINES DO I NEED TO FOLLOW?  Choose fruits, vegetables, whole grains, low-fat dairy products, and low-fat meat, fish, and poultry.  Limit fats such as oils, salad dressings, butter, nuts, and avocado.  Keep a food diary to identify foods that cause symptoms.  Avoid foods that cause reflux. These may be different for different people.  Eat frequent small meals instead of three large meals each day.  Eat your meals slowly, in a relaxed setting.  Limit fried foods.  Cook foods using methods other than frying.  Avoid drinking alcohol.  Avoid drinking large amounts of liquids with your meals.  Avoid bending over or lying down until 2-3 hours after eating. WHAT FOODS ARE NOT RECOMMENDED? The following are some foods and drinks that may worsen your symptoms: Vegetables Tomatoes. Tomato juice. Tomato and spaghetti sauce. Chili peppers. Onion and garlic. Horseradish. Fruits Oranges, grapefruit, and lemon (fruit and juice). Meats High-fat meats, fish, and poultry. This includes hot dogs, ribs, ham, sausage, salami, and bacon. Dairy Whole milk and chocolate milk. Sour cream. Cream. Butter. Ice cream. Cream cheese.  Beverages Coffee and tea, with or without caffeine. Carbonated beverages or energy drinks. Condiments Hot sauce. Barbecue sauce.  Sweets/Desserts Chocolate and cocoa. Donuts. Peppermint and spearmint. Fats and Oils High-fat foods, including [place] fries and potato chips. Other Vinegar. Strong spices, such as black pepper, white pepper, red pepper, cayenne, curry powder,  cloves, ginger, and chili powder.      Bad carbs also include fruit juice, alcohol, and sweet tea. These are empty calories that do not signal to your brain that you are full.   Please remember the good carbs are still carbs which convert into sugar. So please measure them out no more than 1/2-1 cup of rice, oatmeal, pasta, and beans  Veggies are however free foods! Pile them on.   Not all fruit is created equal. Please see the list below, the fruit at the bottom is higher in sugars than the fruit at the top. Please avoid all dried fruits.

## 2018-08-21 NOTE — Progress Notes (Signed)
Post-Op Visit Note   Patient: Brett [name]           Date of Birth: 01/12/1960           MRN: [id number] Visit Date: 08/21/2018 PCP: Brett CowboyMcKeown, William, MD   Assessment & Plan:  Chief Complaint:  Chief Complaint  Patient presents with  . Left Knee - Routine Post Op    08/14/2018 Left Knee Arthroscopy with Partial Medial Meniscectomy, Left Chondroplasty   Visit Diagnoses:  1. Complex tear of medial meniscus of left knee as current injury, subsequent encounter   2. Status post arthroscopy of left knee     Plan: Brett [name] is 1 week status post left knee arthroscopy partial medial meniscectomy and chondroplasty.  He comes in for his 1 week postoperative visit today.  He states that he is taking hydrocodone and Motrin for the pain.  He is having trouble weightbearing fully due to the swelling and pain.  He is still using both crutches.  His surgical incisions are healed.  Sutures were removed and Steri-Strips were applied today.  He does have a moderate joint effusion.  Range of motion is 0 to 75 degrees.  No calf tenderness or bruising.  We discussed that he should take a higher dose of Motrin around the clock to have a basal rate and then Norco for breakthrough pain.  I refilled this today.  Home exercises were provided today.  I would like to track his progress in about 3 weeks.  If his progress is not great then I would send him to outpatient physical therapy.  Given his medial compartment chondromalacia and DJD he needs to have a medial unloader brace ordered.  Questions encouraged and answered.  Follow-Up Instructions: Return in about 3 weeks (around 09/11/2018).   Orders:  No orders of the defined types were placed in this encounter.  Meds ordered this encounter  Medications  . HYDROcodone-acetaminophen (NORCO) 5-325 MG tablet    Sig: Take 1-2 tablets by mouth daily as needed.    Dispense:  10 tablet    Refill:  0    Imaging: No results found.  PMFS History: Patient Active  Problem List   Diagnosis Date Noted  . Complex tear of medial meniscus of left knee as current injury 08/06/2018  . Complex tear of medial meniscus of right knee as current injury 08/06/2018  . Overweight (BMI 25.0-29.9) 08/08/2017  . Medication management 03/23/2014  . Vitamin D deficiency 03/23/2014  . Abnormal glucose 03/23/2014  . Hyperlipemia 10/31/2006  . GERD 10/31/2006   Past Medical History:  Diagnosis Date  . Anal fissure   . Arthritis   . Colon polyps 01/13/11   Colonoscopy  . Diabetes mellitus    pre-diabetic, no meds needed  . GERD (gastroesophageal reflux disease)     Family History  Problem Relation Age of Onset  . Rheum arthritis Mother   . Allergies Daughter   . Allergies Sister   . Asthma Daughter     Past Surgical History:  Procedure Laterality Date  . ANAL FISSURE REPAIR  02/24/11  . CHONDROPLASTY  08/14/2018   Procedure: CHONDROPLASTY;  Surgeon: Tarry KosXu,  M, MD;  Location: [facility];  Service: Orthopedics;;  . COLONOSCOPY    . KNEE ARTHROSCOPY WITH MEDIAL MENISECTOMY Left 08/14/2018   Procedure: LEFT KNEE ARTHROSCOPY WITH PARTIAL MEDIAL MENISCECTOMY;  Surgeon: Tarry KosXu,  M, MD;  Location: [facility];  Service: Orthopedics;  Laterality: Left;   Social History  Occupational History    Employer: [employer]  Tobacco Use  . Smoking status: Current Some Day Smoker    Packs/day: 0.00    Years: 27.00    Pack years: 0.00    Types: Cigarettes  . Smokeless tobacco: Never Used  . Tobacco comment: 1-2 every other day  Substance and Sexual Activity  . Alcohol use: Yes    Comment: socially  . Drug use: No  . Sexual activity: Not on file

## 2018-08-22 ENCOUNTER — Emergency Department (HOSPITAL_COMMUNITY)
Admission: EM | Admit: 2018-08-22 | Discharge: 2018-08-22 | Disposition: A | Discharge status: Home / Self Care | Payer: BC Managed Care – PPO | Attending: Emergency Medicine | Admitting: Emergency Medicine

## 2018-08-22 ENCOUNTER — Emergency Department (HOSPITAL_COMMUNITY): Payer: BC Managed Care – PPO

## 2018-08-22 ENCOUNTER — Other Ambulatory Visit: Payer: Self-pay

## 2018-08-22 ENCOUNTER — Telehealth: Payer: Self-pay | Admitting: Physician Assistant

## 2018-08-22 ENCOUNTER — Encounter (HOSPITAL_COMMUNITY): Payer: Self-pay | Admitting: Emergency Medicine

## 2018-08-22 DIAGNOSIS — R0789 Other chest pain: Secondary | ICD-10-CM | POA: Insufficient documentation

## 2018-08-22 DIAGNOSIS — R0602 Shortness of breath: Secondary | ICD-10-CM | POA: Insufficient documentation

## 2018-08-22 DIAGNOSIS — E119 Type 2 diabetes mellitus without complications: Secondary | ICD-10-CM | POA: Insufficient documentation

## 2018-08-22 DIAGNOSIS — J9811 Atelectasis: Secondary | ICD-10-CM | POA: Diagnosis not present

## 2018-08-22 DIAGNOSIS — F17210 Nicotine dependence, cigarettes, uncomplicated: Secondary | ICD-10-CM | POA: Insufficient documentation

## 2018-08-22 LAB — COMPLETE METABOLIC PANEL WITH GFR
AG Ratio: 1.5 (calc) (ref 1.0–2.5)
ALT: 93 U/L — ABNORMAL HIGH (ref 9–46)
AST: 47 U/L — ABNORMAL HIGH (ref 10–35)
Albumin: 4.4 g/dL (ref 3.6–5.1)
Alkaline phosphatase (APISO): 101 U/L (ref 35–144)
BUN: 21 mg/dL (ref 7–25)
CO2: 28 mmol/L (ref 20–32)
Calcium: 10.2 mg/dL (ref 8.6–10.3)
Chloride: 104 mmol/L (ref 98–110)
Creat: 1.2 mg/dL (ref 0.70–1.33)
GFR, Est African American: 77 mL/min/{1.73_m2} (ref >=60)
GFR, Est Non African American: 67 mL/min/{1.73_m2} (ref >=60)
Globulin: 2.9 g/dL (calc) (ref 1.9–3.7)
Glucose, Bld: 76 mg/dL (ref 65–99)
Potassium: 4.6 mmol/L (ref 3.5–5.3)
Sodium: 141 mmol/L (ref 135–146)
Total Bilirubin: 0.4 mg/dL (ref 0.2–1.2)
Total Protein: 7.3 g/dL (ref 6.1–8.1)

## 2018-08-22 LAB — TSH: TSH: 0.79 mIU/L (ref 0.40–4.50)

## 2018-08-22 LAB — URINALYSIS, ROUTINE W REFLEX MICROSCOPIC
Bilirubin Urine: NEGATIVE
Glucose, UA: NEGATIVE
Hgb urine dipstick: NEGATIVE
Ketones, ur: NEGATIVE
Leukocytes,Ua: NEGATIVE
Nitrite: NEGATIVE
Protein, ur: NEGATIVE
Specific Gravity, Urine: 1.022 (ref 1.001–1.03)
pH: 7 (ref 5.0–8.0)

## 2018-08-22 LAB — CBC
HCT: 39.1 % (ref 39.0–52.0)
Hemoglobin: 12.2 g/dL — ABNORMAL LOW (ref 13.0–17.0)
MCH: 26.6 pg (ref 26.0–34.0)
MCHC: 31.2 g/dL (ref 30.0–36.0)
MCV: 85.2 fL (ref 80.0–100.0)
Platelets: 228 10*3/uL (ref 150–400)
RBC: 4.59 MIL/uL (ref 4.22–5.81)
RDW: 13.2 % (ref 11.5–15.5)
WBC: 7.6 10*3/uL (ref 4.0–10.5)
nRBC: 0 % (ref 0.0–0.2)

## 2018-08-22 LAB — CBC WITH DIFFERENTIAL/PLATELET
Absolute Monocytes: 770 cells/uL (ref 200–950)
Basophils Absolute: 52 cells/uL (ref 0–200)
Basophils Relative: 0.7 %
Eosinophils Absolute: 170 cells/uL (ref 15–500)
Eosinophils Relative: 2.3 %
HCT: 40.8 % (ref 38.5–50.0)
Hemoglobin: 13.1 g/dL — ABNORMAL LOW (ref 13.2–17.1)
Lymphs Abs: 2279 cells/uL (ref 850–3900)
MCH: 26.7 pg — ABNORMAL LOW (ref 27.0–33.0)
MCHC: 32.1 g/dL (ref 32.0–36.0)
MCV: 83.3 fL (ref 80.0–100.0)
MPV: 10.7 fL (ref 7.5–12.5)
Monocytes Relative: 10.4 %
Neutro Abs: 4129 cells/uL (ref 1500–7800)
Neutrophils Relative %: 55.8 %
Platelets: 253 10*3/uL (ref 140–400)
RBC: 4.9 10*6/uL (ref 4.20–5.80)
RDW: 13.5 % (ref 11.0–15.0)
Total Lymphocyte: 30.8 %
WBC: 7.4 10*3/uL (ref 3.8–10.8)

## 2018-08-22 LAB — D-DIMER, QUANTITATIVE: D-Dimer, Quant: 0.35 ug/mL-FEU (ref 0.00–0.50)

## 2018-08-22 LAB — BASIC METABOLIC PANEL
Anion gap: 9 (ref 5–15)
BUN: 17 mg/dL (ref 6–20)
CO2: 27 mmol/L (ref 22–32)
Calcium: 9.3 mg/dL (ref 8.9–10.3)
Chloride: 103 mmol/L (ref 98–111)
Creatinine, Ser: 1.24 mg/dL (ref 0.61–1.24)
GFR calc Af Amer: >60 mL/min (ref >60)
GFR calc non Af Amer: >60 mL/min (ref >60)
Glucose, Bld: 95 mg/dL (ref 70–99)
Potassium: 4.2 mmol/L (ref 3.5–5.1)
Sodium: 139 mmol/L (ref 135–145)

## 2018-08-22 LAB — LIPID PANEL
Cholesterol: 196 mg/dL (ref <200)
HDL: 61 mg/dL (ref >=40)
LDL Cholesterol (Calc): 117 mg/dL (calc) — ABNORMAL HIGH
Non-HDL Cholesterol (Calc): 135 mg/dL (calc) — ABNORMAL HIGH (ref <130)
Total CHOL/HDL Ratio: 3.2 (calc) (ref <5.0)
Triglycerides: 83 mg/dL (ref <150)

## 2018-08-22 LAB — TROPONIN I (HIGH SENSITIVITY): Troponin I (High Sensitivity): 3 ng/L (ref <18)

## 2018-08-22 LAB — IRON, TOTAL/TOTAL IRON BINDING CAP
%SAT: 18 % (calc) — ABNORMAL LOW (ref 20–48)
Iron: 61 ug/dL (ref 50–180)
TIBC: 341 mcg/dL (calc) (ref 250–425)

## 2018-08-22 LAB — HIV ANTIBODY (ROUTINE TESTING W REFLEX): HIV 1&2 Ab, 4th Generation: NONREACTIVE

## 2018-08-22 LAB — HEMOGLOBIN A1C
Hgb A1c MFr Bld: 6.2 % of total Hgb — ABNORMAL HIGH (ref <5.7)
Mean Plasma Glucose: 131 (calc)
eAG (mmol/L): 7.3 (calc)

## 2018-08-22 LAB — VITAMIN D 25 HYDROXY (VIT D DEFICIENCY, FRACTURES): Vit D, 25-Hydroxy: 38 ng/mL (ref 30–100)

## 2018-08-22 LAB — HEPATITIS C ANTIBODY
Hepatitis C Ab: NONREACTIVE
SIGNAL TO CUT-OFF: 0.01 (ref <1.00)

## 2018-08-22 LAB — VITAMIN B12: Vitamin B-12: 700 pg/mL (ref 200–1100)

## 2018-08-22 LAB — MICROALBUMIN / CREATININE URINE RATIO
Creatinine, Urine: 203 mg/dL (ref 20–320)
Microalb Creat Ratio: 7 mcg/mg creat (ref <30)
Microalb, Ur: 1.4 mg/dL

## 2018-08-22 LAB — MAGNESIUM: Magnesium: 2.2 mg/dL (ref 1.5–2.5)

## 2018-08-22 MED ORDER — LIDOCAINE 5 % EX PTCH
1.0000 | MEDICATED_PATCH | CUTANEOUS | Status: DC
  Administered 2018-08-22: 1 via TRANSDERMAL
  Filled 2018-08-22: qty 1

## 2018-08-22 MED ORDER — METHOCARBAMOL 1000 MG/10ML IJ SOLN: 1000.0000 mg | Freq: Once | INTRAMUSCULAR | Status: DC

## 2018-08-22 MED ORDER — METHOCARBAMOL 1000 MG/10ML IJ SOLN
1000.0000 mg | Freq: Once | INTRAVENOUS | Status: AC
  Administered 2018-08-22: 19:00:00 1000 mg via INTRAVENOUS
  Filled 2018-08-22: qty 10

## 2018-08-22 MED ORDER — LIDOCAINE 5 % EX PTCH: 1.0000 | MEDICATED_PATCH | CUTANEOUS | 0 refills | Status: DC

## 2018-08-22 MED ORDER — METHOCARBAMOL 500 MG PO TABS: 500.0000 mg | ORAL_TABLET | Freq: Two times a day (BID) | ORAL | 0 refills | Status: DC | PRN

## 2018-08-22 MED ORDER — SODIUM CHLORIDE 0.9% FLUSH: 3.0000 mL | Freq: Once | INTRAVENOUS | Status: DC

## 2018-08-22 NOTE — Telephone Encounter (Signed)
58 y.o. AAM a/p left knee arthroscopy on 08/14/2018 calls the office with chest pain and shortness of breath. He sounds winded on the phone, and is chest pain. With recent procedure patient was advised to go to the ER to rule out PE.

## 2018-08-22 NOTE — Addendum Note (Signed)
Addended by: [name] R on: 08/22/2018 08:00 AM   Modules accepted: Orders

## 2018-08-22 NOTE — Telephone Encounter (Signed)
please call patient regarding new symptom. States MYChart not available

## 2018-08-22 NOTE — ED Provider Notes (Signed)
[facility] Provider Note   CSN: [id number] Arrival date & time: 08/22/18  1325     History   Chief Complaint Chief Complaint  Patient presents with   Chest Pain   Shortness of Breath    HPI Brett [name] is a 58 y.o. male presenting for evaluation of chest pain and shortness of breath.  Patient states for the past several days, he has been having gradually worsening left-sided chest pain.  Pain is causing him to feel short of breath, as it hurts when he takes a deep breath in.  Patient had knee surgery 8 days ago.  He has been recovering well.  He denies fevers, chills, cough, nausea, vomiting, dental pain, urinary symptoms, normal bowel movements.  Pt's last dose of pain medication was last night.  He denies history of heart problems.  He smokes cigarettes daily.  Denies alcohol or drug use.  No history of diabetes or hypertension.  No family history of heart problems.     HPI  Past Medical History:  Diagnosis Date   Anal fissure    Arthritis    Colon polyps 01/13/11   Colonoscopy   Diabetes mellitus    pre-diabetic, no meds needed   GERD (gastroesophageal reflux disease)     Patient Active Problem List   Diagnosis Date Noted   Complex tear of medial meniscus of left knee as current injury 08/06/2018   Complex tear of medial meniscus of right knee as current injury 08/06/2018   Overweight (BMI 25.0-29.9) 08/08/2017   Medication management 03/23/2014   Vitamin D deficiency 03/23/2014   Abnormal glucose 03/23/2014   Hyperlipemia 10/31/2006   GERD 10/31/2006    Past Surgical History:  Procedure Laterality Date   ANAL FISSURE REPAIR  02/24/11   CHONDROPLASTY  08/14/2018   Procedure: CHONDROPLASTY;  Surgeon: Tarry KosXu, Naiping M, MD;  Location: [facility];  Service: Orthopedics;;   COLONOSCOPY     KNEE ARTHROSCOPY WITH MEDIAL MENISECTOMY Left 08/14/2018   Procedure: LEFT KNEE ARTHROSCOPY WITH PARTIAL  MEDIAL MENISCECTOMY;  Surgeon: Tarry KosXu, Naiping M, MD;  Location: [facility];  Service: Orthopedics;  Laterality: Left;        Home Medications    Prior to Admission medications   Medication Sig Start Date End Date Taking? Authorizing Provider  albuterol (PROVENTIL HFA;VENTOLIN HFA) 108 (90 Base) MCG/ACT inhaler Inhale 1-2 puffs into the lungs every 6 (six) hours as needed for wheezing or shortness of breath. 06/29/16   Deatra Canterxford, William J, FNP  azelastine (ASTELIN) 0.1 % nasal spray Place 2 sprays into both nostrils 2 (two) times daily. Use in each nostril as directed 04/18/17   Quentin Mullingollier, Amanda, PA-C  cholecalciferol (VITAMIN D) 1000 UNITS tablet Take 1,000 Units by mouth daily.    [provider]  glucose blood test strip Use as instructed 03/23/14   Shirleen Schirmerllis, Courtney, PA-C  GOLDENSEAL PO Take 2 capsules by mouth 3 (three) times daily.    [provider]  HYDROcodone-acetaminophen (NORCO) 5-325 MG tablet Take 1-2 tablets by mouth 3 (three) times daily as needed. 08/14/18   Tarry KosXu, Naiping M, MD  HYDROcodone-acetaminophen (NORCO) 5-325 MG tablet Take 1-2 tablets by mouth daily as needed. 08/21/18   Tarry KosXu, Naiping M, MD  ibuprofen (ADVIL,MOTRIN) 600 MG tablet Take 1 tablet (600 mg total) by mouth every 6 (six) hours as needed. 03/23/17   Janne NapoleonNeese, Hope M, NP  lidocaine (LIDODERM) 5 % Place 1 patch onto the skin daily. Remove &  Discard patch within 12 hours or as directed by MD 08/22/18   Orine Goga, PA-C  methocarbamol (ROBAXIN) 500 MG tablet Take 1 tablet (500 mg total) by mouth 2 (two) times daily as needed for muscle spasms. 08/22/18   Uriyah Massimo, PA-C  omeprazole (PRILOSEC) 40 MG capsule Take 1 capsule (40 mg total) by mouth daily. 08/21/18 08/21/19  [name], PA-C  ondansetron (ZOFRAN) 4 MG tablet Take 1-2 tablets (4-8 mg total) by mouth every 8 (eight) hours as needed for nausea or vomiting. 08/14/18   Leandrew Koyanagi, MD  vitamin E 400 UNIT capsule Take 400 Units by  mouth daily.    [provider]    Family History Family History  Problem Relation Age of Onset   Rheum arthritis Mother    Allergies Daughter    Allergies Sister    Asthma Daughter     Social History Social History   Tobacco Use   Smoking status: Current Some Day Smoker    Packs/day: 0.00    Years: 27.00    Pack years: 0.00    Types: Cigarettes   Smokeless tobacco: Never Used   Tobacco comment: 1-2 every other day  Substance Use Topics   Alcohol use: Yes    Comment: socially   Drug use: No     Allergies   Benzodiazepines, Chantix  [varenicline], Cialis [tadalafil], Dicyclomine, Meloxicam, Penicillins, and Relafen [nabumetone]   Review of Systems Review of Systems  Respiratory: Positive for shortness of breath.   Cardiovascular: Positive for chest pain.  All other systems reviewed and are negative.    Physical Exam Updated Vital Signs BP 117/84 (BP Location: Right Arm)    Pulse 69    Temp 98.4 F (36.9 C) (Oral)    Resp 12    Ht 5\' 6"  (1.676 m)    Wt 75.8 kg    SpO2 97%    BMI 26.95 kg/m   Physical Exam Vitals signs and nursing note reviewed.  Constitutional:      General: He is not in acute distress.    Appearance: He is well-developed.     Comments: Resting in the bed in no acute distress  HENT:     Head: Normocephalic and atraumatic.  Eyes:     Extraocular Movements: Extraocular movements intact.     Conjunctiva/sclera: Conjunctivae normal.     Pupils: Pupils are equal, round, and reactive to light.  Neck:     Musculoskeletal: Normal range of motion and neck supple.  Cardiovascular:     Rate and Rhythm: Normal rate and regular rhythm.     Pulses: Normal pulses.  Pulmonary:     Effort: Pulmonary effort is normal. No respiratory distress.     Breath sounds: Normal breath sounds. No wheezing.     Comments: Speaking in full sentences.  Clear lung sounds in all fields.  Taking short shallow breaths due to pain. Tenderness palpation  of the left side chest wall anteriorly, laterally, and posteriorly. Chest:     Chest wall: Tenderness present.  Abdominal:     General: There is no distension.     Palpations: Abdomen is soft. There is no mass.     Tenderness: There is no abdominal tenderness. There is no guarding or rebound.  Musculoskeletal: Normal range of motion.     Comments: Slight discomfort with movement of the left shoulder, although full active range of motion without difficulty.  Radial pulses and grip strength intact bilaterally No leg pain or swelling  Skin:    General: Skin is warm and dry.  Neurological:     Mental Status: He is alert and oriented to person, place, and time.      ED Treatments / Results  Labs (all labs ordered are listed, but only abnormal results are displayed) Labs Reviewed  CBC - Abnormal; Notable for the following components:      Result Value   Hemoglobin 12.2 (*)    All other components within normal limits  BASIC METABOLIC PANEL  D-DIMER, QUANTITATIVE (NOT [facility])  TROPONIN I (HIGH SENSITIVITY)    EKG None  Radiology Dg Chest 2 View  Result Date: 08/22/2018 CLINICAL DATA:  Chest pain EXAM: CHEST - 2 VIEW COMPARISON:  04/18/2017 FINDINGS: Low lung volumes. Minimal basilar atelectasis. No consolidation or effusion. Heart size upper normal. No pneumothorax. IMPRESSION: No active cardiopulmonary disease. Low lung volumes with minimal basilar atelectasis Electronically Signed   By: Jasmine PangKim  Fujinaga M.D.   On: 08/22/2018 14:20    Procedures Procedures (including critical care time)  Medications Ordered in ED Medications  sodium chloride flush (NS) 0.9 % injection 3 mL (has no administration in time range)  lidocaine (LIDODERM) 5 % 1 patch (1 patch Transdermal Patch Applied 08/22/18 1820)  methocarbamol (ROBAXIN) 1,000 mg in dextrose 5 % 50 mL IVPB (0 mg Intravenous Stopped 08/22/18 1930)     Initial Impression / Assessment and Plan / ED Course  I have reviewed the  triage vital signs and the nursing notes.  Pertinent labs & imaging results that were available during my care of the patient were reviewed by me and considered in my medical decision making (see chart for details).        Pt presenting for evaluation of chest pain and shortness of breath.  Physical exam reassuring, he appears nontoxic.  Pain is reproducible with palpation of the chest wall.  Biggest concerns for chest pain or shortness of breath in a postop setting include infection and PE.  As patient is without fever, chills, cough, low suspicion for infection.  X-ray viewed interpreted by me, no obvious pneumonia.  Does show mild atelectasis, likely due to splinting.  Initial troponin negative at 3.  Doubt ACS.  Dimer negative.  Patient without tachycardia or hypotension, and pain is producible with palpation of the chest wall.  As such, doubt PE.  Likely MSK pain.  Consider worsened pain due to using crutches.  Discussed findings with patient and wife.  Discussed symptomatic treatment and reassessment.  On reassessment after Lidoderm patch and muscle relaxer, patient reports pain is better.  His breathing is easier.  Will give patient incentive spirometer.  Discussed importance of pain control to prevent atelectasis and thus pneumonia.  Discussed close follow-up with his primary care doctor symptoms not improving.  At this time, patient appears safe for discharge.  Return precautions given.  Patient states he understands and agrees to plan.  Final Clinical Impressions(s) / ED Diagnoses   Final diagnoses:  Left-sided chest wall pain  Atelectasis    ED Discharge Orders         Ordered    lidocaine (LIDODERM) 5 %  Every 24 hours     08/22/18 1959    methocarbamol (ROBAXIN) 500 MG tablet  2 times daily PRN     08/22/18 1959           Alveria ApleyCaccavale, Brevyn Ring, PA-C 08/22/18 2051    Loren RacerYelverton, David, MD 08/28/18 1346

## 2018-08-22 NOTE — ED Triage Notes (Signed)
Pt states he had knee surgery last Wednesday. Over the past two days pt has developed CP/SOB. Denies n/v

## 2018-08-22 NOTE — Discharge Instructions (Signed)
Continue taking home medications as prescribed. Use Robaxin as needed for muscle stiffness or spasm. Have caution, as this may make you tired or groggy. Do not drive or operate heavy machinery while taking this medication.  Use muscle creams (bengay, icy hot, salonpas) as needed for pain.  Use the incentive spirometer at least 10 times a day to work on your breathing. Use a walker instead of your crutches to take the pressure off the muscles.  Use heat and/or ice to help with pain. Follow up with your primary care doctor if pain is not improving with this treatment.  Return to the ER if you develop high fevers, cough, increased shortness of breath, or any new, worsening, or concerning symptoms.

## 2018-08-22 NOTE — ED Notes (Addendum)
Wife's phone number [phone]

## 2018-08-26 ENCOUNTER — Other Ambulatory Visit: Payer: Self-pay

## 2018-08-26 MED ORDER — BLOOD GLUCOSE METER KIT: PACK | 0 refills | Status: DC

## 2018-08-27 ENCOUNTER — Telehealth: Payer: Self-pay | Admitting: Orthopaedic Surgery

## 2018-08-27 NOTE — Telephone Encounter (Signed)
Patient called wanting to know what his restrictions are and if he could drive.  CB#[phone].  Thank you.

## 2018-08-27 NOTE — Telephone Encounter (Signed)
Activity as tolerated. No restrictions.

## 2018-08-27 NOTE — Telephone Encounter (Signed)
I called patient and advised. 

## 2018-08-27 NOTE — Telephone Encounter (Signed)
Please advise 

## 2018-08-28 ENCOUNTER — Other Ambulatory Visit: Payer: Self-pay

## 2018-08-28 MED ORDER — BLOOD GLUCOSE METER KIT: PACK | 0 refills | Status: DC

## 2018-08-29 ENCOUNTER — Other Ambulatory Visit: Payer: Self-pay

## 2018-08-29 ENCOUNTER — Ambulatory Visit
Admission: RE | Admit: 2018-08-29 | Discharge: 2018-08-29 | Disposition: A | Discharge status: Home / Self Care | Payer: BC Managed Care – PPO | Source: Ambulatory Visit | Attending: Physician Assistant | Admitting: Physician Assistant

## 2018-08-29 DIAGNOSIS — R7989 Other specified abnormal findings of blood chemistry: Secondary | ICD-10-CM

## 2018-08-29 DIAGNOSIS — K219 Gastro-esophageal reflux disease without esophagitis: Secondary | ICD-10-CM

## 2018-08-29 MED ORDER — BLOOD GLUCOSE METER KIT: PACK | 0 refills | Status: DC

## 2018-09-04 ENCOUNTER — Telehealth: Payer: Self-pay | Admitting: Orthopaedic Surgery

## 2018-09-04 ENCOUNTER — Other Ambulatory Visit: Payer: Self-pay | Admitting: Physician Assistant

## 2018-09-04 MED ORDER — TRAMADOL HCL 50 MG PO TABS: ORAL_TABLET | ORAL | 0 refills | Status: DC

## 2018-09-04 NOTE — Telephone Encounter (Signed)
I left voicemail for patient advising. 

## 2018-09-04 NOTE — Telephone Encounter (Signed)
Please advise 

## 2018-09-04 NOTE — Telephone Encounter (Signed)
Sent in

## 2018-09-04 NOTE — Telephone Encounter (Signed)
error 

## 2018-09-04 NOTE — Telephone Encounter (Signed)
Pt called in requesting a refill on Tramadol, please have that sent to [facility] on [address].  [phone]

## 2018-09-11 ENCOUNTER — Ambulatory Visit (INDEPENDENT_AMBULATORY_CARE_PROVIDER_SITE_OTHER): Payer: BC Managed Care – PPO | Admitting: Physician Assistant

## 2018-09-11 ENCOUNTER — Encounter: Payer: Self-pay | Admitting: Orthopaedic Surgery

## 2018-09-11 VITALS — Ht 66.0 in | Wt 167.0 lb

## 2018-09-11 DIAGNOSIS — Z9889 Other specified postprocedural states: Secondary | ICD-10-CM

## 2018-09-11 MED ORDER — TRAMADOL HCL 50 MG PO TABS: ORAL_TABLET | ORAL | 1 refills | Status: DC

## 2018-09-11 NOTE — Progress Notes (Signed)
Post-Op Visit Note   Patient: Brett [name]           Date of Birth: [birth year]           MRN: [id number] Visit Date: 09/11/2018 PCP: Unk Pinto, MD   Assessment & Plan:  Chief Complaint:  Chief Complaint  Patient presents with  . Left Knee - Routine Post Op    08/14/2018 left knee arthro w/partial medial menisectomy   Visit Diagnoses:  1. S/P left knee arthroscopy     Plan: Patient is a pleasant 58 year old gentleman who presents our clinic today approximately 4 weeks status post left knee arthroscopic debridement medial meniscus, date of surgery 08/14/2018.  It was noted during operative mention he had grade 3 changes to the medial femoral condyle which is where he is still exhibiting pain.  He also notes continued swelling.  He has been working on a home exercise program where he is made little progress.  No fevers or chills.  He did pick up his DJD unloader brace but has not really been wearing this.  Examination of his left knee reveals a trace effusion.  Range of motion 0 to 95 degrees.  Calf soft nontender.  Is neurovascular intact distally.  At this point, we will start him in physical therapy.  I have refilled his tramadol.  He will follow-up with [place] in 3 to 4 weeks time for repeat evaluation and to discuss right knee arthroscopy.  Follow-Up Instructions: Return in about 4 weeks (around 10/09/2018).   Orders:  No orders of the defined types were placed in this encounter.  Meds ordered this encounter  Medications  . traMADol (ULTRAM) 50 MG tablet    Sig: Take 1-2 po q 6-8 hours prn pain    Dispense:  30 tablet    Refill:  1    Imaging: No new imaging  PMFS History: Patient Active Problem List   Diagnosis Date Noted  . Complex tear of medial meniscus of left knee as current injury 08/06/2018  . Complex tear of medial meniscus of right knee as current injury 08/06/2018  . Overweight (BMI 25.0-29.9) 08/08/2017  . Medication management 03/23/2014  . Vitamin D  deficiency 03/23/2014  . Abnormal glucose 03/23/2014  . Hyperlipemia 10/31/2006  . GERD 10/31/2006   Past Medical History:  Diagnosis Date  . Anal fissure   . Arthritis   . Colon polyps 01/13/11   Colonoscopy  . Diabetes mellitus    pre-diabetic, no meds needed  . GERD (gastroesophageal reflux disease)     Family History  Problem Relation Age of Onset  . Rheum arthritis Mother   . Allergies Daughter   . Allergies Sister   . Asthma Daughter     Past Surgical History:  Procedure Laterality Date  . ANAL FISSURE REPAIR  02/24/11  . CHONDROPLASTY  08/14/2018   Procedure: CHONDROPLASTY;  Surgeon: Leandrew Koyanagi, MD;  Location: [place];  Service: Orthopedics;;  . COLONOSCOPY    . KNEE ARTHROSCOPY WITH MEDIAL MENISECTOMY Left 08/14/2018   Procedure: LEFT KNEE ARTHROSCOPY WITH PARTIAL MEDIAL MENISCECTOMY;  Surgeon: Leandrew Koyanagi, MD;  Location: [place];  Service: Orthopedics;  Laterality: Left;   Social History   Occupational History    Employer: [employer]  Tobacco Use  . Smoking status: Current Some Day Smoker    Packs/day: 0.00    Years: 27.00    Pack years: 0.00    Types: Cigarettes  . Smokeless tobacco: Never Used  .  Tobacco comment: 1-2 every other day  Substance and Sexual Activity  . Alcohol use: Yes    Comment: socially  . Drug use: No  . Sexual activity: Not on file

## 2018-10-09 ENCOUNTER — Encounter: Payer: Self-pay | Admitting: Orthopaedic Surgery

## 2018-10-09 ENCOUNTER — Ambulatory Visit (INDEPENDENT_AMBULATORY_CARE_PROVIDER_SITE_OTHER): Payer: BC Managed Care – PPO | Admitting: Orthopaedic Surgery

## 2018-10-09 DIAGNOSIS — Z9889 Other specified postprocedural states: Secondary | ICD-10-CM | POA: Diagnosis not present

## 2018-10-09 DIAGNOSIS — S83231D Complex tear of medial meniscus, current injury, right knee, subsequent encounter: Secondary | ICD-10-CM

## 2018-10-09 DIAGNOSIS — S83232D Complex tear of medial meniscus, current injury, left knee, subsequent encounter: Secondary | ICD-10-CM

## 2018-10-09 NOTE — Progress Notes (Signed)
Office Visit Note   Patient: Brett [name]           Date of Birth: September 12, 1960           MRN: [id number] Visit Date: 10/09/2018              Requested by: Lucky Cowboy, MD [address] [place],  [place] [address] PCP: Lucky Cowboy, MD   Assessment & Plan: Visit Diagnoses:  1. Complex tear of medial meniscus of right knee as current injury, subsequent encounter   2. Complex tear of medial meniscus of left knee as current injury, subsequent encounter   3. Status post arthroscopy of left knee     Plan: Brett [name] has done well from his left knee scope.  He feels better from this and at this point is ready to undergo the right knee arthroscopy for partial medial meniscectomy.  Questions encouraged and answered.  We will schedule his surgery for the near future.  Follow-Up Instructions: Return for 1 week postop visit.   Orders:  No orders of the defined types were placed in this encounter.  No orders of the defined types were placed in this encounter.     Procedures: No procedures performed   Clinical Data: No additional findings.   Subjective: Chief Complaint  Patient presents with  . Left Knee - Follow-up  . Right Knee - Follow-up    Brett [name] returns today for follow-up of his left knee arthroscopy and his right knee medial meniscal tear.  He feels that the left knee is recovered well enough to undergo the right knee surgery.  Denies any changes in medical history.   Review of Systems   Objective: Vital Signs: There were no vitals taken for this visit.  Physical Exam  Ortho Exam Right knee exam shows trace joint effusion.  Significant medial joint line tenderness.  There is mechanical catching.  Pain with range of motion of the knee. Specialty Comments:  No specialty comments available.  Imaging: No results found.   PMFS History: Patient Active Problem List   Diagnosis Date Noted  . Complex tear of medial meniscus of left knee  as current injury 08/06/2018  . Complex tear of medial meniscus of right knee as current injury 08/06/2018  . Overweight (BMI 25.0-29.9) 08/08/2017  . Medication management 03/23/2014  . Vitamin D deficiency 03/23/2014  . Abnormal glucose 03/23/2014  . Hyperlipemia 10/31/2006  . GERD 10/31/2006   Past Medical History:  Diagnosis Date  . Anal fissure   . Arthritis   . Colon polyps 01/13/11   Colonoscopy  . Diabetes mellitus    pre-diabetic, no meds needed  . GERD (gastroesophageal reflux disease)     Family History  Problem Relation Age of Onset  . Rheum arthritis Mother   . Allergies Daughter   . Allergies Sister   . Asthma Daughter     Past Surgical History:  Procedure Laterality Date  . ANAL FISSURE REPAIR  02/24/11  . CHONDROPLASTY  08/14/2018   Procedure: CHONDROPLASTY;  Surgeon: Tarry Kos, MD;  Location: [facility];  Service: Orthopedics;;  . COLONOSCOPY    . KNEE ARTHROSCOPY WITH MEDIAL MENISECTOMY Left 08/14/2018   Procedure: LEFT KNEE ARTHROSCOPY WITH PARTIAL MEDIAL MENISCECTOMY;  Surgeon: Tarry Kos, MD;  Location: [facility];  Service: Orthopedics;  Laterality: Left;   Social History   Occupational History    Employer: [employer]  Tobacco Use  . Smoking status: Current Some Day Smoker  Packs/day: 0.00    Years: 27.00    Pack years: 0.00    Types: Cigarettes  . Smokeless tobacco: Never Used  . Tobacco comment: 1-2 every other day  Substance and Sexual Activity  . Alcohol use: Yes    Comment: socially  . Drug use: No  . Sexual activity: Not on file

## 2018-10-11 ENCOUNTER — Other Ambulatory Visit: Payer: Self-pay

## 2018-10-11 ENCOUNTER — Encounter (HOSPITAL_BASED_OUTPATIENT_CLINIC_OR_DEPARTMENT_OTHER): Payer: Self-pay

## 2018-10-14 ENCOUNTER — Other Ambulatory Visit: Payer: Self-pay

## 2018-10-15 ENCOUNTER — Other Ambulatory Visit (HOSPITAL_COMMUNITY)
Admission: RE | Admit: 2018-10-15 | Discharge: 2018-10-15 | Disposition: A | Discharge status: Home / Self Care | Payer: BC Managed Care – PPO | Source: Ambulatory Visit | Attending: Orthopaedic Surgery | Admitting: Orthopaedic Surgery

## 2018-10-15 DIAGNOSIS — Z01812 Encounter for preprocedural laboratory examination: Secondary | ICD-10-CM | POA: Insufficient documentation

## 2018-10-15 DIAGNOSIS — Z20828 Contact with and (suspected) exposure to other viral communicable diseases: Secondary | ICD-10-CM | POA: Insufficient documentation

## 2018-10-16 LAB — NOVEL CORONAVIRUS, NAA (HOSP ORDER, SEND-OUT TO REF LAB; TAT 18-24 HRS): SARS-CoV-2, NAA: NOT DETECTED

## 2018-10-17 NOTE — Pre-Procedure Instructions (Signed)
Gave patient G2 drink for ERAS with instructions to complete 2 hours prior to arrival DOS (by 9a).

## 2018-10-18 ENCOUNTER — Ambulatory Visit (HOSPITAL_BASED_OUTPATIENT_CLINIC_OR_DEPARTMENT_OTHER): Payer: BC Managed Care – PPO | Admitting: Certified Registered"

## 2018-10-18 ENCOUNTER — Ambulatory Visit (HOSPITAL_BASED_OUTPATIENT_CLINIC_OR_DEPARTMENT_OTHER)
Admission: RE | Admit: 2018-10-18 | Discharge: 2018-10-18 | Disposition: A | Discharge status: Home / Self Care | Payer: BC Managed Care – PPO | Attending: Orthopaedic Surgery | Admitting: Orthopaedic Surgery

## 2018-10-18 ENCOUNTER — Other Ambulatory Visit: Payer: Self-pay

## 2018-10-18 ENCOUNTER — Encounter (HOSPITAL_BASED_OUTPATIENT_CLINIC_OR_DEPARTMENT_OTHER)
Admission: RE | Disposition: A | Discharge status: Home / Self Care | Payer: Self-pay | Source: Home / Self Care | Attending: Orthopaedic Surgery

## 2018-10-18 ENCOUNTER — Encounter (HOSPITAL_BASED_OUTPATIENT_CLINIC_OR_DEPARTMENT_OTHER): Payer: Self-pay | Admitting: Anesthesiology

## 2018-10-18 DIAGNOSIS — Z79899 Other long term (current) drug therapy: Secondary | ICD-10-CM | POA: Insufficient documentation

## 2018-10-18 DIAGNOSIS — S83231A Complex tear of medial meniscus, current injury, right knee, initial encounter: Secondary | ICD-10-CM | POA: Diagnosis not present

## 2018-10-18 DIAGNOSIS — S83231D Complex tear of medial meniscus, current injury, right knee, subsequent encounter: Secondary | ICD-10-CM

## 2018-10-18 DIAGNOSIS — M659 Synovitis and tenosynovitis, unspecified: Secondary | ICD-10-CM | POA: Diagnosis not present

## 2018-10-18 DIAGNOSIS — X58XXXA Exposure to other specified factors, initial encounter: Secondary | ICD-10-CM | POA: Diagnosis not present

## 2018-10-18 DIAGNOSIS — K219 Gastro-esophageal reflux disease without esophagitis: Secondary | ICD-10-CM | POA: Diagnosis not present

## 2018-10-18 DIAGNOSIS — M94261 Chondromalacia, right knee: Secondary | ICD-10-CM | POA: Insufficient documentation

## 2018-10-18 DIAGNOSIS — Z87891 Personal history of nicotine dependence: Secondary | ICD-10-CM | POA: Diagnosis not present

## 2018-10-18 HISTORY — PX: KNEE ARTHROSCOPY WITH MEDIAL MENISECTOMY: SHX5651

## 2018-10-18 HISTORY — DX: Allergy, unspecified, initial encounter: T78.40XA

## 2018-10-18 LAB — GLUCOSE, CAPILLARY: Glucose-Capillary: 103 mg/dL — ABNORMAL HIGH (ref 70–99)

## 2018-10-18 SURGERY — ARTHROSCOPY, KNEE, WITH MEDIAL MENISCECTOMY
Anesthesia: General | Site: Knee | Laterality: Right

## 2018-10-18 MED ORDER — ACETAMINOPHEN 10 MG/ML IV SOLN
1000.0000 mg | Freq: Once | INTRAVENOUS | Status: DC | PRN
  Administered 2018-10-18: 1000 mg via INTRAVENOUS

## 2018-10-18 MED ORDER — KETOROLAC TROMETHAMINE 10 MG PO TABS: 10.0000 mg | ORAL_TABLET | Freq: Two times a day (BID) | ORAL | 0 refills | Status: DC | PRN

## 2018-10-18 MED ORDER — FENTANYL CITRATE (PF) 100 MCG/2ML IJ SOLN
INTRAMUSCULAR | Status: AC
  Filled 2018-10-18: qty 2

## 2018-10-18 MED ORDER — MIDAZOLAM HCL 2 MG/2ML IJ SOLN: 1.0000 mg | INTRAMUSCULAR | Status: DC | PRN

## 2018-10-18 MED ORDER — CHLORHEXIDINE GLUCONATE 4 % EX LIQD: 60.0000 mL | Freq: Once | CUTANEOUS | Status: DC

## 2018-10-18 MED ORDER — ACETAMINOPHEN 10 MG/ML IV SOLN
INTRAVENOUS | Status: AC
  Filled 2018-10-18: qty 100

## 2018-10-18 MED ORDER — CLINDAMYCIN PHOSPHATE 900 MG/50ML IV SOLN
900.0000 mg | INTRAVENOUS | Status: AC
  Administered 2018-10-18: 11:00:00 900 mg via INTRAVENOUS

## 2018-10-18 MED ORDER — KETOROLAC TROMETHAMINE 30 MG/ML IJ SOLN
30.0000 mg | Freq: Once | INTRAMUSCULAR | Status: AC
  Administered 2018-10-18: 30 mg via INTRAVENOUS

## 2018-10-18 MED ORDER — FENTANYL CITRATE (PF) 100 MCG/2ML IJ SOLN
25.0000 ug | INTRAMUSCULAR | Status: DC | PRN
  Administered 2018-10-18: 25 ug via INTRAVENOUS
  Administered 2018-10-18: 12:00:00 50 ug via INTRAVENOUS

## 2018-10-18 MED ORDER — OXYCODONE HCL 5 MG PO TABS
5.0000 mg | ORAL_TABLET | Freq: Once | ORAL | Status: AC | PRN
  Administered 2018-10-18: 5 mg via ORAL

## 2018-10-18 MED ORDER — OXYCODONE HCL 5 MG/5ML PO SOLN: 5.0000 mg | Freq: Once | ORAL | Status: AC | PRN

## 2018-10-18 MED ORDER — BUPIVACAINE HCL (PF) 0.25 % IJ SOLN
INTRAMUSCULAR | Status: DC | PRN
  Administered 2018-10-18: 20 mL

## 2018-10-18 MED ORDER — OXYCODONE HCL 5 MG PO TABS
ORAL_TABLET | ORAL | Status: AC
  Filled 2018-10-18: qty 1

## 2018-10-18 MED ORDER — MEPERIDINE HCL 25 MG/ML IJ SOLN: 6.2500 mg | INTRAMUSCULAR | Status: DC | PRN

## 2018-10-18 MED ORDER — ACETAMINOPHEN 160 MG/5ML PO SOLN: 325.0000 mg | Freq: Once | ORAL | Status: DC | PRN

## 2018-10-18 MED ORDER — LACTATED RINGERS IV SOLN
INTRAVENOUS | Status: DC
  Administered 2018-10-18: 10:00:00 via INTRAVENOUS

## 2018-10-18 MED ORDER — BUPIVACAINE HCL (PF) 0.25 % IJ SOLN
INTRAMUSCULAR | Status: AC
  Filled 2018-10-18: qty 30

## 2018-10-18 MED ORDER — ONDANSETRON HCL 4 MG/2ML IJ SOLN
INTRAMUSCULAR | Status: DC | PRN
  Administered 2018-10-18: 4 mg via INTRAVENOUS

## 2018-10-18 MED ORDER — SODIUM CHLORIDE 0.9 % IR SOLN
Status: DC | PRN
  Administered 2018-10-18: 3000 mL

## 2018-10-18 MED ORDER — ACETAMINOPHEN 325 MG PO TABS: 325.0000 mg | ORAL_TABLET | Freq: Once | ORAL | Status: DC | PRN

## 2018-10-18 MED ORDER — LACTATED RINGERS IV SOLN: INTRAVENOUS | Status: DC

## 2018-10-18 MED ORDER — FENTANYL CITRATE (PF) 100 MCG/2ML IJ SOLN
50.0000 ug | INTRAMUSCULAR | Status: DC | PRN
  Administered 2018-10-18 (×2): 50 ug via INTRAVENOUS

## 2018-10-18 MED ORDER — DEXAMETHASONE SODIUM PHOSPHATE 10 MG/ML IJ SOLN
INTRAMUSCULAR | Status: DC | PRN
  Administered 2018-10-18: 4 mg via INTRAVENOUS

## 2018-10-18 MED ORDER — CLINDAMYCIN PHOSPHATE 900 MG/50ML IV SOLN
INTRAVENOUS | Status: AC
  Filled 2018-10-18: qty 50

## 2018-10-18 MED ORDER — HYDROCODONE-ACETAMINOPHEN 5-325 MG PO TABS: 1.0000 | ORAL_TABLET | Freq: Three times a day (TID) | ORAL | 0 refills | Status: DC | PRN

## 2018-10-18 MED ORDER — PROMETHAZINE HCL 25 MG/ML IJ SOLN: 6.2500 mg | INTRAMUSCULAR | Status: DC | PRN

## 2018-10-18 MED ORDER — LIDOCAINE HCL (CARDIAC) PF 100 MG/5ML IV SOSY
PREFILLED_SYRINGE | INTRAVENOUS | Status: DC | PRN
  Administered 2018-10-18: 60 mg via INTRAVENOUS

## 2018-10-18 MED ORDER — KETOROLAC TROMETHAMINE 30 MG/ML IJ SOLN
INTRAMUSCULAR | Status: AC
  Filled 2018-10-18: qty 1

## 2018-10-18 MED ORDER — BUPIVACAINE HCL (PF) 0.5 % IJ SOLN
INTRAMUSCULAR | Status: AC
  Filled 2018-10-18: qty 30

## 2018-10-18 MED ORDER — PROPOFOL 10 MG/ML IV BOLUS
INTRAVENOUS | Status: DC | PRN
  Administered 2018-10-18: 200 mg via INTRAVENOUS

## 2018-10-18 SURGICAL SUPPLY — 40 items
BANDAGE ESMARK 6X9 LF (GAUZE/BANDAGES/DRESSINGS) IMPLANT
BLADE CUDA GRT WHITE 3.5 (BLADE) IMPLANT
BLADE CUDA SHAVER 3.5 (BLADE) IMPLANT
BLADE CUTTER GATOR 3.5 (BLADE) IMPLANT
BLADE GREAT WHITE 4.2 (BLADE) IMPLANT
BLADE LANZA CVD 15 DEG (BLADE) IMPLANT
BLADE SHAVER TORPEDO 4X13 (MISCELLANEOUS) ×2 IMPLANT
BNDG ELASTIC 6X5.8 VLCR STR LF (GAUZE/BANDAGES/DRESSINGS) ×4 IMPLANT
BNDG ESMARK 6X9 LF (GAUZE/BANDAGES/DRESSINGS)
COVER WAND RF STERILE (DRAPES) IMPLANT
CUFF TOURN SGL QUICK 34 (TOURNIQUET CUFF) ×1
CUFF TRNQT CYL 34X4.125X (TOURNIQUET CUFF) ×1 IMPLANT
DRAPE ARTHROSCOPY W/POUCH 90 (DRAPES) ×2 IMPLANT
DRAPE IMP U-DRAPE 54X76 (DRAPES) ×2 IMPLANT
DRAPE U-SHAPE 47X51 STRL (DRAPES) ×2 IMPLANT
DURAPREP 26ML APPLICATOR (WOUND CARE) ×2 IMPLANT
EXCALIBUR 3.8MM X 13CM (MISCELLANEOUS) ×2 IMPLANT
GAUZE SPONGE 4X4 12PLY STRL (GAUZE/BANDAGES/DRESSINGS) ×2 IMPLANT
GAUZE XEROFORM 1X8 LF (GAUZE/BANDAGES/DRESSINGS) ×2 IMPLANT
GLOVE BIOGEL PI IND STRL 7.0 (GLOVE) ×1 IMPLANT
GLOVE BIOGEL PI INDICATOR 7.0 (GLOVE) ×1
GLOVE ECLIPSE 7.0 STRL STRAW (GLOVE) ×2 IMPLANT
GLOVE SKINSENSE NS SZ7.5 (GLOVE) ×1
GLOVE SKINSENSE STRL SZ7.5 (GLOVE) ×1 IMPLANT
GLOVE SURG SYN 7.5  E (GLOVE) ×1
GLOVE SURG SYN 7.5 E (GLOVE) ×1 IMPLANT
GOWN STRL REIN XL XLG (GOWN DISPOSABLE) ×2 IMPLANT
GOWN STRL REUS W/ TWL LRG LVL3 (GOWN DISPOSABLE) ×1 IMPLANT
GOWN STRL REUS W/ TWL XL LVL3 (GOWN DISPOSABLE) ×1 IMPLANT
GOWN STRL REUS W/TWL LRG LVL3 (GOWN DISPOSABLE) ×1
GOWN STRL REUS W/TWL XL LVL3 (GOWN DISPOSABLE) ×1
KNEE WRAP E Z 3 GEL PACK (MISCELLANEOUS) ×2 IMPLANT
MANIFOLD NEPTUNE II (INSTRUMENTS) ×2 IMPLANT
PACK ARTHROSCOPY DSU (CUSTOM PROCEDURE TRAY) ×2 IMPLANT
PACK BASIN DAY SURGERY FS (CUSTOM PROCEDURE TRAY) ×2 IMPLANT
RESECTOR FULL RADIUS 4.2MM (BLADE) IMPLANT
SHAVER 4.2 MM LANZA 9391A (BLADE) ×2 IMPLANT
SUT ETHILON 3 0 PS 1 (SUTURE) ×2 IMPLANT
TOWEL GREEN STERILE FF (TOWEL DISPOSABLE) ×2 IMPLANT
TUBING ARTHRO INFLOW-ONLY STRL (TUBING) ×2 IMPLANT

## 2018-10-18 NOTE — Anesthesia Procedure Notes (Signed)
Procedure Name: LMA Insertion Date/Time: 10/18/2018 11:15 AM Performed by: Signe Colt, CRNA Pre-anesthesia Checklist: Patient identified, Emergency Drugs available, Suction available and Patient being monitored Patient Re-evaluated:Patient Re-evaluated prior to induction Oxygen Delivery Method: Circle system utilized Preoxygenation: Pre-oxygenation with 100% oxygen Induction Type: IV induction Ventilation: Mask ventilation without difficulty LMA: LMA inserted LMA Size: 4.0 Number of attempts: 1 Airway Equipment and Method: Bite block Placement Confirmation: positive ETCO2 Tube secured with: Tape Dental Injury: Teeth and Oropharynx as per pre-operative assessment

## 2018-10-18 NOTE — Anesthesia Preprocedure Evaluation (Addendum)
Anesthesia Evaluation  Patient identified by MRN, date of birth, ID band Patient awake    Reviewed: Allergy & Precautions, NPO status , Patient's Chart, lab work & pertinent test results  Airway Mallampati: II  TM Distance: >3 FB Neck ROM: Full    Dental no notable dental hx.    Pulmonary Patient abstained from smoking., former smoker,    Pulmonary exam normal        Cardiovascular negative cardio ROS   Rhythm:Regular Rate:Normal     Neuro/Psych negative neurological ROS     GI/Hepatic Neg liver ROS, GERD  ,  Endo/Other  diabetes  Renal/GU negative Renal ROS     Musculoskeletal  (+) Arthritis ,   Abdominal Normal abdominal exam  (+)   Peds  Hematology negative hematology ROS (+)   Anesthesia Other Findings   Reproductive/Obstetrics                            Anesthesia Physical Anesthesia Plan  ASA: II  Anesthesia Plan: General   Post-op Pain Management:    Induction: Intravenous  PONV Risk Score and Plan: 3 and Ondansetron, Dexamethasone and Midazolam  Airway Management Planned: LMA  Additional Equipment: None  Intra-op Plan:   Post-operative Plan: Extubation in OR  Informed Consent: I have reviewed the patients History and Physical, chart, labs and discussed the procedure including the risks, benefits and alternatives for the proposed anesthesia with the patient or authorized representative who has indicated his/her understanding and acceptance.       Plan Discussed with: CRNA  Anesthesia Plan Comments:        Anesthesia Quick Evaluation

## 2018-10-18 NOTE — Op Note (Signed)
   Surgery Date: 10/18/2018  Surgeon(s): Leandrew Koyanagi, MD  ASSIST: Madalyn Rob, [place]; necessary for the timely completion of procedure and due to complexity of procedure.  ANESTHESIA:  general  FLUIDS: Per anesthesia record.   ESTIMATED BLOOD LOSS: minimal  PREOPERATIVE DIAGNOSES:  1. Right knee medial meniscus tear 2. Right knee synovitis  POSTOPERATIVE DIAGNOSES:  same  PROCEDURES PERFORMED:  1. Right knee arthroscopy with major synovectomy 2. Right knee arthroscopy with arthroscopic partial medial meniscectomy 3. Right knee arthroscopy with arthroscopic chondroplasty medial femoral condyle and femoral trochlea.  DESCRIPTION OF PROCEDURE: Brett [name] is a 57 y.o.-year-old male with right knee medial meniscus tear. Plans are to proceed with partial medial meniscectomy and diagnostic arthroscopy with debridement as indicated. Full discussion held regarding risks benefits alternatives and complications related surgical intervention. Conservative care options reviewed. All questions answered.  The patient was identified in the preoperative holding area and the operative extremity was marked. The patient was brought to the operating room and transferred to operating table in a supine position. Satisfactory general anesthesia was induced by anesthesiology.    Standard anterolateral, anteromedial arthroscopy portals were obtained. The anteromedial portal was obtained with a spinal needle for localization under direct visualization with subsequent diagnostic findings.   Incisions were made for arthroscopic portals.  Diagnostic arthroscopy was first performed.  Major synovectomy in all 3 compartments was performed using an oscillating shaver.  We then reposition the arthroscope into the medial compartment with the knee in a valgus stress we were able to probe the complex tear of the medial meniscus in the posterior horn.  A partial medial meniscectomy was performed back to stable  border with combination of meniscus basket and oscillating shaver.  Chondroplasty was performed for the medial femoral condyle.  The cruciates were unremarkable.  Lateral compartment was relatively unremarkable.  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  The gutters were evaluated for loose bodies.  Excess fluid was removed from the knee joint.  Incisions were closed with interrupted nylon sutures.  Sterile dressings were applied.  Patient tolerated the procedure well had no immediate complications.  Suprapatellar pouch and gutters: moderate synovitis or debris. Patella chondral surface: Grade 1 Trochlear chondral surface: Grade 4 Patellofemoral tracking: Normal Medial meniscus: Complex tear posterior horn.  Medial femoral condyle weight bearing surface: Grade 4 Medial tibial plateau: Grade 3 Anterior cruciate ligament:stable Posterior cruciate ligament:stable Lateral meniscus: Normal.   Lateral femoral condyle weight bearing surface: Grade 1 Lateral tibial plateau: Grade 1  DISPOSITION: The patient was awakened from general anesthetic, extubated, taken to the recovery room in medically stable condition, no apparent complications. The patient may be weightbearing as tolerated to the operative lower extremity.  Range of motion of right knee as tolerated.  Brett Cecil, MD [facility] 3:07 PM

## 2018-10-18 NOTE — Transfer of Care (Signed)
Immediate Anesthesia Transfer of Care Note  Patient: Brett [name]  Procedure(s) Performed: RIGHT KNEE ARTHROSCOPY WITH PARTIAL MEDIAL MENISCECTOMY (Right Knee)  Patient Location: PACU  Anesthesia Type:General  Level of Consciousness: drowsy and patient cooperative  Airway & Oxygen Therapy: Patient Spontanous Breathing and Patient connected to face mask oxygen  Post-op Assessment: Report given to RN and Post -op Vital signs reviewed and stable  Post vital signs: Reviewed and stable  Last Vitals:  Vitals Value Taken Time  BP    Temp    Pulse 78 10/18/18 1200  Resp 6 10/18/18 1200  SpO2 99 % 10/18/18 1200  Vitals shown include unvalidated device data.  Last Pain:  Vitals:   10/11/18 0958  TempSrc: Oral  PainSc: 6       Patients Stated Pain Goal: 4 (93/[id number])  Complications: No apparent anesthesia complications

## 2018-10-18 NOTE — H&P (Signed)
PREOPERATIVE H&P  Chief Complaint: right knee medial meniscal tear  HPI: Brett [name] is a 58 y.o. male who presents for surgical treatment of right knee medial meniscal tear.  He denies any changes in medical history.  Past Medical History:  Diagnosis Date  . Allergy    albuterol as needed  . Anal fissure   . Arthritis   . Colon polyps 01/13/11   Colonoscopy  . Diabetes mellitus    pre-diabetic, no meds needed  . GERD (gastroesophageal reflux disease)    Past Surgical History:  Procedure Laterality Date  . ANAL FISSURE REPAIR  02/24/11  . CHONDROPLASTY  08/14/2018   Procedure: CHONDROPLASTY;  Surgeon: Leandrew Koyanagi, MD;  Location: [facility];  Service: Orthopedics;;  . COLONOSCOPY    . KNEE ARTHROSCOPY WITH MEDIAL MENISECTOMY Left 08/14/2018   Procedure: LEFT KNEE ARTHROSCOPY WITH PARTIAL MEDIAL MENISCECTOMY;  Surgeon: Leandrew Koyanagi, MD;  Location: [facility];  Service: Orthopedics;  Laterality: Left;   Social History   Socioeconomic History  . Marital status: Married    Spouse name: Not on file  . Number of children: 2  . Years of education: Not on file  . Highest education level: Not on file  Occupational History    Employer: [employer]  . Financial resource strain: Not on file  . Food insecurity    Worry: Not on file    Inability: Not on file  . Transportation needs    Medical: Not on file    Non-medical: Not on file  Tobacco Use  . Smoking status: Former Smoker    Packs/day: 0.00    Years: 27.00    Pack years: 0.00    Types: Cigarettes    Quit date: 08/20/2018    Years since quitting: 0.1  . Smokeless tobacco: Never Used  Substance and Sexual Activity  . Alcohol use: Not Currently    Comment: socially  . Drug use: No  . Sexual activity: Not on file  Lifestyle  . Physical activity    Days per week: Not on file    Minutes per session: Not on file  . Stress: Not on file  Relationships  . Social  Herbalist on phone: Not on file    Gets together: Not on file    Attends religious service: Not on file    Active member of club or organization: Not on file    Attends meetings of clubs or organizations: Not on file    Relationship status: Not on file  Other Topics Concern  . Not on file  Social History Narrative  . Not on file   Family History  Problem Relation Age of Onset  . Rheum arthritis Mother   . Allergies Daughter   . Allergies Sister   . Asthma Daughter    Allergies  Allergen Reactions  . Benzodiazepines Hives  . Chantix  [Varenicline] Hives  . Cialis [Tadalafil]     Hives  . Dicyclomine Hives  . Meloxicam     Rash  . Penicillins Hives    DID THE REACTION INVOLVE: Swelling of the face/tongue/throat, SOB, or low BP? Unknown Sudden or severe rash/hives, skin peeling, or the inside of the mouth or nose? Unknown Did it require medical treatment? n  When did it last happen?Over 5 years ago. If all above answers are "NO", may proceed with cephalosporin use.   . Relafen [Nabumetone]     Hives  Prior to Admission medications   Medication Sig Start Date End Date Taking? Authorizing Provider  cholecalciferol (VITAMIN D) 1000 UNITS tablet Take 1,000 Units by mouth daily.   Yes [provider]  GOLDENSEAL PO Take 2 capsules by mouth 3 (three) times daily.   Yes [provider]  ibuprofen (ADVIL,MOTRIN) 600 MG tablet Take 1 tablet (600 mg total) by mouth every 6 (six) hours as needed. 03/23/17  Yes Neese, [place], NP  omeprazole (PRILOSEC) 40 MG capsule Take 1 capsule (40 mg total) by mouth daily. 08/21/18 08/21/19 Yes [name], PA-C  traMADol Veatrice Bourbon) 50 MG tablet Take 1-2 po q 6-8 hours prn pain 09/11/18  Yes Aundra Dubin, PA-C  vitamin E 400 UNIT capsule Take 400 Units by mouth daily.   Yes [provider]  albuterol (PROVENTIL HFA;VENTOLIN HFA) 108 (90 Base) MCG/ACT inhaler Inhale 1-2 puffs into the lungs every 6 (six)  hours as needed for wheezing or shortness of breath. 06/29/16   Lysbeth Penner, FNP  azelastine (ASTELIN) 0.1 % nasal spray Place 2 sprays into both nostrils 2 (two) times daily. Use in each nostril as directed 04/18/17   [name], PA-C  blood glucose meter kit and supplies Test blood sugar twice daily or as directed by [facility] provider. 08/28/18   [name], PA-C  blood glucose meter kit and supplies Dispense based insurance preference. E11.22 08/29/18   [name], PA-C  glucose blood test strip Use as instructed 03/23/14   Rolene Course, PA-C     Positive ROS: All other systems have been reviewed and were otherwise negative with the exception of those mentioned in the HPI and as above.  Physical Exam: General: Alert, no acute distress Cardiovascular: No pedal edema Respiratory: No cyanosis, no use of accessory musculature GI: abdomen soft Skin: No lesions in the area of chief complaint Neurologic: Sensation intact distally Psychiatric: Patient is competent for consent with normal mood and affect Lymphatic: no lymphedema  MUSCULOSKELETAL: exam stable  Assessment: right knee medial meniscal tear  Plan: Plan for Procedure(s): RIGHT KNEE ARTHROSCOPY WITH PARTIAL MEDIAL MENISCECTOMY  The risks benefits and alternatives were discussed with the patient including but not limited to the risks of nonoperative treatment, versus surgical intervention including infection, bleeding, nerve injury,  blood clots, cardiopulmonary complications, morbidity, mortality, among others, and they were willing to proceed.   Eduard Roux, MD   10/18/2018 8:07 AM

## 2018-10-18 NOTE — Anesthesia Postprocedure Evaluation (Addendum)
Anesthesia Post Note  Patient: Glass blower/designer) Performed: RIGHT KNEE ARTHROSCOPY WITH PARTIAL MEDIAL MENISCECTOMY (Right Knee)     Patient location during evaluation: PACU Anesthesia Type: General Level of consciousness: awake and alert Pain management: pain level controlled Vital Signs Assessment: post-procedure vital signs reviewed and stable Respiratory status: spontaneous breathing, nonlabored ventilation, respiratory function stable and patient connected to nasal cannula oxygen Cardiovascular status: blood pressure returned to baseline and stable Postop Assessment: no apparent nausea or vomiting Anesthetic complications: no    Last Vitals:  Vitals:   10/18/18 1330 10/18/18 1345  BP: (!) 145/91 (!) 147/92  Pulse: (!) 45 (!) 57  Resp: 17 20  Temp:    SpO2: 100% 100%               Effie Berkshire

## 2018-10-18 NOTE — Discharge Instructions (Signed)
° ° °Post-operative patient instructions  °Knee Arthroscopy  ° °• Ice:  Place intermittent ice or cooler pack over your knee, 30 minutes on and 30 minutes off.  Continue this for the first 72 hours after surgery, then save ice for use after therapy sessions or on more active days.   °• Weight:  You may bear weight on your leg as your symptoms allow. °• Crutches:  Use crutches (or walker) to assist in walking until told to discontinue by your physical therapist or physician. This will help to reduce pain. °• Strengthening:  Perform simple thigh squeezes (isometric quad contractions) and straight leg lifts as you are able (3 sets of 5 to 10 repetitions, 3 times a day).  For the leg lifts, have someone support under your ankle in the beginning until you have increased strength enough to do this on your own.  To help get started on thigh squeezes, place a pillow under your knee and push down on the pillow with back of knee (sometimes easier to do than with your leg fully straight). °• Motion:  Perform gentle knee motion as tolerated - this is gentle bending and straightening of the knee. Seated heel slides: you can start by sitting in a chair, remove your brace, and gently slide your heel back on the floor - allowing your knee to bend. Have someone help you straighten your knee (or use your other leg/foot hooked under your ankle.  °• Dressing:  Perform 1st dressing change at 2 days postoperative. A moderate amount of blood tinged drainage is to be expected.  So if you bleed through the dressing on the first or second day or if you have fevers, it is fine to change the dressing/check the wounds early and redress wound. Elevate your leg.  If it bleeds through again, or if the incisions are leaking frank blood, please call the office. May change dressing every 1-2 days thereafter to help watch wounds. Can purchase Tegaderm (or 3M Nexcare) water resistant dressings at local pharmacy / [facility]. °• Shower:  Light shower is  ok after 2 days.  Please take shower, NO bath. Recover with gauze and ace wrap to help keep wounds protected.   °• Pain medication:  A narcotic pain medication has been prescribed.  Take as directed.  Typically you need narcotic pain medication more regularly during the first 3 to 5 days after surgery.  Decrease your use of the medication as the pain improves.  Narcotics can sometimes cause constipation, even after a few doses.  If you have problems with constipation, you can take an over the counter stool softener or light laxative.  If you have persistent problems, please notify your physician’s office. °• Physical therapy: Additional activity guidelines to be provided by your physician or physical therapist at follow-up visits.  °• Driving: Do not recommend driving x 2 weeks post surgical, especially if surgery performed on right side. Should not drive while taking narcotic pain medications. It typically takes at least 2 weeks to restore sufficient neuromuscular function for normal reaction times for driving safety.  °• Call [phone] for questions or problems. Evenings you will be forwarded to the hospital operator.  Ask for the orthopaedic physician on call. Please call if you experience:  °  °o Redness, foul smelling, or persistent drainage from the surgical site  °o worsening knee pain and swelling not responsive to medication  °o any calf pain and or swelling of the lower leg  °o temperatures greater than   101.5 F o other questions or concerns   Thank you for allowing [place] to be a part of your care.  No Tylenol or Toradol until 7pm    Post Anesthesia Home Care Instructions  Activity: Get plenty of rest for the remainder of the day. A responsible individual must stay with you for 24 hours following the procedure.  For the next 24 hours, DO NOT: -Drive a car -Paediatric nurse -Drink alcoholic beverages -Take any medication unless instructed by your physician -Make any legal decisions or sign  important papers.  Meals: Start with liquid foods such as gelatin or soup. Progress to regular foods as tolerated. Avoid greasy, spicy, heavy foods. If nausea and/or vomiting occur, drink only clear liquids until the nausea and/or vomiting subsides. Call your physician if vomiting continues.  Special Instructions/Symptoms: Your throat may feel dry or sore from the anesthesia or the breathing tube placed in your throat during surgery. If this causes discomfort, gargle with warm salt water. The discomfort should disappear within 24 hours.  If you had a scopolamine patch placed behind your ear for the management of post- operative nausea and/or vomiting:  1. The medication in the patch is effective for 72 hours, after which it should be removed.  Wrap patch in a tissue and discard in the trash. Wash hands thoroughly with soap and water. 2. You may remove the patch earlier than 72 hours if you experience unpleasant side effects which may include dry mouth, dizziness or visual disturbances. 3. Avoid touching the patch. Wash your hands with soap and water after contact with the patch.

## 2018-10-21 ENCOUNTER — Encounter (HOSPITAL_BASED_OUTPATIENT_CLINIC_OR_DEPARTMENT_OTHER): Payer: Self-pay | Admitting: Orthopaedic Surgery

## 2018-10-21 ENCOUNTER — Telehealth: Payer: Self-pay

## 2018-10-21 NOTE — Telephone Encounter (Signed)
Sounds like he is still having an allergic reaction.  If he is having trouble swallowing he needs to go to ED

## 2018-10-21 NOTE — Telephone Encounter (Signed)
Patient called stating that he had a reaction to the Toradol on Saturday monring that he was prescribed after his surgery. Patient had Right Knee surgery on 10/18/2018.  Stated that he spoke with the on call doctor and was advised to stop taking the medication and to take Benadryl.  Stated that he is having trouble swallowing, soreness in his throat, and an itchy feeling.  Would like to know if he needs to be taken something else besides the Hydrocodone and the Benadryl?  Cb# is [phone].  Please advise.  Thank You.

## 2018-10-21 NOTE — Telephone Encounter (Signed)
Can you let him know this. I tried calling but will not accept restricted calls. I'm in the temps and don't have access to my other phone.

## 2018-10-21 NOTE — Telephone Encounter (Signed)
Talked with patient and advised him of message below.  He stated that his throat is irritated when he swallows and that his tonsils are swollen.  Stated that he wasn't sure if the Hydrocodone or if the Benadryl is making his skin feel itchy or flaky.  Would like to know what he needs to do and if Toradol would be replaced with another medication?  Please advise.  Thank you.

## 2018-10-21 NOTE — Telephone Encounter (Signed)
See message below °

## 2018-10-22 ENCOUNTER — Other Ambulatory Visit: Payer: Self-pay | Admitting: Physician Assistant

## 2018-10-22 MED ORDER — IBUPROFEN 800 MG PO TABS: 800.0000 mg | ORAL_TABLET | Freq: Three times a day (TID) | ORAL | 2 refills | Status: DC | PRN

## 2018-10-22 NOTE — Telephone Encounter (Signed)
Stop taking all of it.  Is there any pain medicine that he know he is able to take?

## 2018-10-22 NOTE — Telephone Encounter (Signed)
Would like  Ibuprofen 800 mg.  Uses [facility] [place].

## 2018-10-22 NOTE — Telephone Encounter (Signed)
I just sent in

## 2018-10-22 NOTE — Telephone Encounter (Signed)
See message below °

## 2018-10-24 ENCOUNTER — Ambulatory Visit (INDEPENDENT_AMBULATORY_CARE_PROVIDER_SITE_OTHER): Payer: BC Managed Care – PPO | Admitting: Physician Assistant

## 2018-10-24 ENCOUNTER — Encounter: Payer: Self-pay | Admitting: Physician Assistant

## 2018-10-24 ENCOUNTER — Other Ambulatory Visit: Payer: Self-pay

## 2018-10-24 DIAGNOSIS — Z9889 Other specified postprocedural states: Secondary | ICD-10-CM

## 2018-10-24 NOTE — Progress Notes (Signed)
Post-Op Visit Note   Patient: Brett [name]           Date of Birth: [birth year]           MRN: [id number] Visit Date: 10/24/2018 PCP: Unk Pinto, MD   Assessment & Plan:  Chief Complaint:  Chief Complaint  Patient presents with  . Left Knee - Routine Post Op   Visit Diagnoses:  1. S/P right knee arthroscopy   2. S/P left knee arthroscopy     Plan: Patient is a pleasant 58 year old gentleman who presents our clinic today 1 week status post right knee arthroscopic debridement medial meniscus and chondroplasty medial femoral condyle, date of surgery 10/18/2018.  It was noted during operative intervention he had grade 3 changes to the medial and patellofemoral compartments.  He has been dealing with a moderate amount of pain.  He has been taking ibuprofen for this.  No fevers or chills.  Examination of his right knee reveals a very small effusion.  He has well-healing surgical portals with nylon sutures in place.  Calf is soft and nontender.  He is neurovascularly intact distally.  At this point, nylon sutures were removed.  We will have him resume physical therapy for his left knee and add physical therapy for his right knee.  A new prescription was provided today for this.  He will follow-up with [place] in 5 weeks time for repeat evaluation.  Call with concerns or questions in the meantime.  Follow-Up Instructions: Return in about 5 weeks (around 11/28/2018).   Orders:  No orders of the defined types were placed in this encounter.  No orders of the defined types were placed in this encounter.   Imaging: No new imaging  PMFS History: Patient Active Problem List   Diagnosis Date Noted  . Complex tear of medial meniscus of left knee as current injury 08/06/2018  . Complex tear of medial meniscus of right knee as current injury 08/06/2018  . Overweight (BMI 25.0-29.9) 08/08/2017  . Medication management 03/23/2014  . Vitamin D deficiency 03/23/2014  . Abnormal glucose  03/23/2014  . Hyperlipemia 10/31/2006  . GERD 10/31/2006   Past Medical History:  Diagnosis Date  . Allergy    albuterol as needed  . Anal fissure   . Arthritis   . Colon polyps 01/13/11   Colonoscopy  . Diabetes mellitus    pre-diabetic, no meds needed  . GERD (gastroesophageal reflux disease)     Family History  Problem Relation Age of Onset  . Rheum arthritis Mother   . Allergies Daughter   . Allergies Sister   . Asthma Daughter     Past Surgical History:  Procedure Laterality Date  . ANAL FISSURE REPAIR  02/24/11  . CHONDROPLASTY  08/14/2018   Procedure: CHONDROPLASTY;  Surgeon: Leandrew Koyanagi, MD;  Location: [facility];  Service: Orthopedics;;  . COLONOSCOPY    . KNEE ARTHROSCOPY WITH MEDIAL MENISECTOMY Left 08/14/2018   Procedure: LEFT KNEE ARTHROSCOPY WITH PARTIAL MEDIAL MENISCECTOMY;  Surgeon: Leandrew Koyanagi, MD;  Location: [facility];  Service: Orthopedics;  Laterality: Left;  . KNEE ARTHROSCOPY WITH MEDIAL MENISECTOMY Right 10/18/2018   Procedure: RIGHT KNEE ARTHROSCOPY WITH PARTIAL MEDIAL MENISCECTOMY;  Surgeon: Leandrew Koyanagi, MD;  Location: [facility];  Service: Orthopedics;  Laterality: Right;   Social History   Occupational History    Employer: [employer]  Tobacco Use  . Smoking status: Former Smoker    Packs/day: 0.00  Years: 27.00    Pack years: 0.00    Types: Cigarettes    Quit date: 08/20/2018    Years since quitting: 0.1  . Smokeless tobacco: Never Used  Substance and Sexual Activity  . Alcohol use: Not Currently    Comment: socially  . Drug use: No  . Sexual activity: Not on file

## 2018-11-22 ENCOUNTER — Other Ambulatory Visit: Payer: Self-pay

## 2018-11-22 DIAGNOSIS — Z20822 Contact with and (suspected) exposure to covid-19: Secondary | ICD-10-CM

## 2018-11-25 LAB — NOVEL CORONAVIRUS, NAA: SARS-CoV-2, NAA: NOT DETECTED

## 2018-11-27 ENCOUNTER — Encounter: Payer: Self-pay | Admitting: Orthopaedic Surgery

## 2018-11-27 ENCOUNTER — Other Ambulatory Visit: Payer: Self-pay

## 2018-11-27 ENCOUNTER — Ambulatory Visit (INDEPENDENT_AMBULATORY_CARE_PROVIDER_SITE_OTHER): Payer: BC Managed Care – PPO | Admitting: Orthopaedic Surgery

## 2018-11-27 DIAGNOSIS — M1712 Unilateral primary osteoarthritis, left knee: Secondary | ICD-10-CM | POA: Diagnosis not present

## 2018-11-27 DIAGNOSIS — M1711 Unilateral primary osteoarthritis, right knee: Secondary | ICD-10-CM

## 2018-11-27 MED ORDER — METHYLPREDNISOLONE ACETATE 40 MG/ML IJ SUSP
40.0000 mg | INTRAMUSCULAR | Status: AC | PRN
  Administered 2018-11-27: 40 mg via INTRA_ARTICULAR

## 2018-11-27 MED ORDER — BUPIVACAINE HCL 0.5 % IJ SOLN
2.0000 mL | INTRAMUSCULAR | Status: AC | PRN
  Administered 2018-11-27: 2 mL via INTRA_ARTICULAR

## 2018-11-27 MED ORDER — LIDOCAINE HCL 1 % IJ SOLN
2.0000 mL | INTRAMUSCULAR | Status: AC | PRN
  Administered 2018-11-27: 2 mL

## 2018-11-27 MED ORDER — PENNSAID 2 % EX SOLN: 2.0000 g | Freq: Two times a day (BID) | CUTANEOUS | 3 refills | Status: DC | PRN

## 2018-11-27 NOTE — Addendum Note (Signed)
Addended by: Azucena Cecil on: 11/27/2018 02:17 PM   Modules accepted: Level of Service

## 2018-11-27 NOTE — Progress Notes (Addendum)
Office Visit Note   Patient: Brett [name]           Date of Birth: [birth year]           MRN: [id number] Visit Date: 11/27/2018              Requested by: Unk Pinto, [place] [address] [place],  [place] [address] PCP: Unk Pinto, MD   Assessment & Plan: Visit Diagnoses:  1. Primary osteoarthritis of left knee   2. Primary osteoarthritis of right knee     Plan: Impression is bilateral knee osteoarthritis and chondromalacia.  Based on discussion today we will inject both knees with cortisone.  I also mentioned viscosupplementation injections and we will send in a prescription for Pennsaid gel.  Disability form was filled out for him today.  Questions encouraged and answered.  Follow-up as needed. Total face to face encounter time was greater than 25 minutes and over half of this time was spent in counseling and/or coordination of care.  Follow-Up Instructions: Return if symptoms worsen or fail to improve.   Orders:  No orders of the defined types were placed in this encounter.  Meds ordered this encounter  Medications  . Diclofenac Sodium (PENNSAID) 2 % SOLN    Sig: Apply 2 g topically 2 (two) times daily as needed (to affected area).    Dispense:  112 g    Refill:  3      Procedures: Large Joint Inj: bilateral knee on 11/27/2018 1:57 PM Indications: pain Details: 22 G needle  Arthrogram: No  Medications (Right): 2 mL lidocaine 1 %; 2 mL bupivacaine 0.5 %; 40 mg methylPREDNISolone acetate 40 MG/ML Medications (Left): 2 mL lidocaine 1 %; 2 mL bupivacaine 0.5 %; 40 mg methylPREDNISolone acetate 40 MG/ML Outcome: tolerated well, no immediate complications Patient was prepped and draped in the usual sterile fashion.       Clinical Data: No additional findings.   Subjective: Chief Complaint  Patient presents with  . Left Knee - Follow-up, Routine [place] returns today status post right knee arthroscopy approximately 6  weeks ago with an left knee arthroscopy approximately 3 months ago.  He states that his arthritis pain is recurrent.  He is experiencing quite a bit of throbbing aching pain and anterior knee pain.  His knee gave out on Friday because of the fall.  The pain is worse with prolonged standing and walking and increased activity.   Review of Systems   Objective: Vital Signs: There were no vitals taken for this visit.  Physical Exam  Ortho Exam Bilateral knee exams show no joint effusion.  Fully healed surgical scars.  Good range of motion. Specialty Comments:  No specialty comments available.  Imaging: No results found.   PMFS History: Patient Active Problem List   Diagnosis Date Noted  . Complex tear of medial meniscus of left knee as current injury 08/06/2018  . Complex tear of medial meniscus of right knee as current injury 08/06/2018  . Overweight (BMI 25.0-29.9) 08/08/2017  . Medication management 03/23/2014  . Vitamin D deficiency 03/23/2014  . Abnormal glucose 03/23/2014  . Hyperlipemia 10/31/2006  . GERD 10/31/2006   Past Medical History:  Diagnosis Date  . Allergy    albuterol as needed  . Anal fissure   . Arthritis   . Colon polyps 01/13/11   Colonoscopy  . Diabetes mellitus    pre-diabetic, no meds needed  . GERD (gastroesophageal reflux disease)  Family History  Problem Relation Age of Onset  . Rheum arthritis Mother   . Allergies Daughter   . Allergies Sister   . Asthma Daughter     Past Surgical History:  Procedure Laterality Date  . ANAL FISSURE REPAIR  02/24/11  . CHONDROPLASTY  08/14/2018   Procedure: CHONDROPLASTY;  Surgeon: Tarry Kos, MD;  Location: [facility];  Service: Orthopedics;;  . COLONOSCOPY    . KNEE ARTHROSCOPY WITH MEDIAL MENISECTOMY Left 08/14/2018   Procedure: LEFT KNEE ARTHROSCOPY WITH PARTIAL MEDIAL MENISCECTOMY;  Surgeon: Tarry Kos, MD;  Location: [facility];  Service: Orthopedics;   Laterality: Left;  . KNEE ARTHROSCOPY WITH MEDIAL MENISECTOMY Right 10/18/2018   Procedure: RIGHT KNEE ARTHROSCOPY WITH PARTIAL MEDIAL MENISCECTOMY;  Surgeon: Tarry Kos, MD;  Location: [facility];  Service: Orthopedics;  Laterality: Right;   Social History   Occupational History    Employer: [employer]  Tobacco Use  . Smoking status: Former Smoker    Packs/day: 0.00    Years: 27.00    Pack years: 0.00    Types: Cigarettes    Quit date: 08/20/2018    Years since quitting: 0.2  . Smokeless tobacco: Never Used  Substance and Sexual Activity  . Alcohol use: Not Currently    Comment: socially  . Drug use: No  . Sexual activity: Not on file

## 2018-12-11 ENCOUNTER — Telehealth: Payer: Self-pay | Admitting: Orthopaedic Surgery

## 2018-12-11 NOTE — Telephone Encounter (Signed)
Patient called and requesting a call back from Dr. [name] nurse or assistant. Patient states disability requesting information from Nov. 26, 2020 visit. Patient phone number is [phone]

## 2018-12-12 ENCOUNTER — Telehealth: Payer: Self-pay

## 2018-12-12 NOTE — Telephone Encounter (Signed)
Tammy faxed [employer] papers and work note

## 2018-12-12 NOTE — Telephone Encounter (Signed)
Yes that's fine 

## 2018-12-12 NOTE — Telephone Encounter (Signed)
Patient came in the office today. He is currently working but is not able to do his job duties. Would like to be OOW until he has a knee replacement. Would like ti have SU next month.     Cell CB [place]

## 2018-12-12 NOTE — Telephone Encounter (Signed)
Note made.  

## 2018-12-12 NOTE — Telephone Encounter (Signed)
Pt aware.

## 2018-12-12 NOTE — Telephone Encounter (Signed)
See other message

## 2018-12-16 ENCOUNTER — Telehealth: Payer: Self-pay | Admitting: Orthopaedic Surgery

## 2018-12-16 NOTE — Telephone Encounter (Signed)
Patient wanted to speak to you about his medical records  [id number]) [phone])

## 2018-12-17 ENCOUNTER — Ambulatory Visit (INDEPENDENT_AMBULATORY_CARE_PROVIDER_SITE_OTHER): Payer: BC Managed Care – PPO | Admitting: Orthopaedic Surgery

## 2018-12-17 ENCOUNTER — Other Ambulatory Visit: Payer: Self-pay

## 2018-12-17 ENCOUNTER — Encounter: Payer: Self-pay | Admitting: Orthopaedic Surgery

## 2018-12-17 DIAGNOSIS — M1711 Unilateral primary osteoarthritis, right knee: Secondary | ICD-10-CM

## 2018-12-17 DIAGNOSIS — M1712 Unilateral primary osteoarthritis, left knee: Secondary | ICD-10-CM

## 2018-12-17 NOTE — Progress Notes (Signed)
Office Visit Note   Patient: Brett [name]           Date of Birth: 1960-01-21           MRN: [id number] Visit Date: 12/17/2018              Requested by: Unk Pinto, MD [address] [place],  [place] [address] PCP: Unk Pinto, MD   Assessment & Plan: Visit Diagnoses:  1. Primary osteoarthritis of left knee   2. Primary osteoarthritis of right knee     Plan: Impression is end-stage bilateral knee osteoarthritis worse on the left.  Unfortunately the patient did not get the relief that he was looking for from the partial medial meniscectomy.  He continues to have significant pain and disability due to his bilateral knee DJD.  He has chronic severe pain now and he is unable to perform ADLs without pain.  We had a lengthy discussion on treatment options and based on our discussion patient has elected to proceed with a left total knee replacement.  Risk benefits alternatives and recovery reviewed with the patient.  Questions answered to his satisfaction.  We will schedule him in the near future.  Follow-Up Instructions: Return for 2 week postop visit.   Orders:  No orders of the defined types were placed in this encounter.  No orders of the defined types were placed in this encounter.     Procedures: No procedures performed   Clinical Data: No additional findings.   Subjective: Chief Complaint  Patient presents with  . Left Knee - Pain    Patient returns today for evaluation of continued bilateral knee pain worse on the left.  He is ambulating with a cane.  He states that he is unable to perform any ADLs without severe pain.  He has no quality of life.  He has not been able to return back to work.  He is contemplating filing for disability.  He noticed some improvement from physical therapy but unfortunately not significant.  He has used Voltaren gel without relief.  He is not interested in any other injections at this point.   Review of  Systems  Constitutional: Negative.   All other systems reviewed and are negative.    Objective: Vital Signs: There were no vitals taken for this visit.  Physical Exam Vitals and nursing note reviewed.  Constitutional:      Appearance: He is well-developed.  Pulmonary:     Effort: Pulmonary effort is normal.  Abdominal:     Palpations: Abdomen is soft.  Skin:    General: Skin is warm.  Neurological:     Mental Status: He is alert and oriented to person, place, and time.  Psychiatric:        Behavior: Behavior normal.        Thought Content: Thought content normal.        Judgment: Judgment normal.     Ortho Exam Left knee exam shows fully healed surgical scars.  There is no joint effusion.  Collaterals and cruciates are stable.  Slight limitation range of motion secondary to pain.  Collaterals and cruciates are stable.  Right knee exam is symmetric. Specialty Comments:  No specialty comments available.  Imaging: No results found.   PMFS History: Patient Active Problem List   Diagnosis Date Noted  . Primary osteoarthritis of left knee 12/17/2018  . Primary osteoarthritis of right knee 12/17/2018  . Complex tear of medial meniscus of left knee as current  injury 08/06/2018  . Complex tear of medial meniscus of right knee as current injury 08/06/2018  . Overweight (BMI 25.0-29.9) 08/08/2017  . Medication management 03/23/2014  . Vitamin D deficiency 03/23/2014  . Abnormal glucose 03/23/2014  . Hyperlipemia 10/31/2006  . GERD 10/31/2006   Past Medical History:  Diagnosis Date  . Allergy    albuterol as needed  . Anal fissure   . Arthritis   . Colon polyps 01/13/11   Colonoscopy  . Diabetes mellitus    pre-diabetic, no meds needed  . GERD (gastroesophageal reflux disease)     Family History  Problem Relation Age of Onset  . Rheum arthritis Mother   . Allergies Daughter   . Allergies Sister   . Asthma Daughter     Past Surgical History:  Procedure  Laterality Date  . ANAL FISSURE REPAIR  02/24/11  . CHONDROPLASTY  08/14/2018   Procedure: CHONDROPLASTY;  Surgeon: Tarry Kos, MD;  Location: [facility];  Service: Orthopedics;;  . COLONOSCOPY    . KNEE ARTHROSCOPY WITH MEDIAL MENISECTOMY Left 08/14/2018   Procedure: LEFT KNEE ARTHROSCOPY WITH PARTIAL MEDIAL MENISCECTOMY;  Surgeon: Tarry Kos, MD;  Location: [facility];  Service: Orthopedics;  Laterality: Left;  . KNEE ARTHROSCOPY WITH MEDIAL MENISECTOMY Right 10/18/2018   Procedure: RIGHT KNEE ARTHROSCOPY WITH PARTIAL MEDIAL MENISCECTOMY;  Surgeon: Tarry Kos, MD;  Location: [facility];  Service: Orthopedics;  Laterality: Right;   Social History   Occupational History    Employer: [employer]  Tobacco Use  . Smoking status: Former Smoker    Packs/day: 0.00    Years: 27.00    Pack years: 0.00    Types: Cigarettes    Quit date: 08/20/2018    Years since quitting: 0.3  . Smokeless tobacco: Never Used  Substance and Sexual Activity  . Alcohol use: Not Currently    Comment: socially  . Drug use: No  . Sexual activity: Not on file

## 2018-12-17 NOTE — Telephone Encounter (Signed)
Patient has appt scheduled for today.

## 2018-12-20 ENCOUNTER — Telehealth: Payer: Self-pay | Admitting: Orthopaedic Surgery

## 2018-12-20 NOTE — Telephone Encounter (Signed)
Pending Dr [name] signature.

## 2018-12-20 NOTE — Telephone Encounter (Signed)
Patient stated that he left [employer] papers here to be filled out and trying to find out if they are completed. [phone]  Please ca;;patient to advise.

## 2018-12-25 ENCOUNTER — Encounter: Payer: Self-pay | Admitting: Physician Assistant

## 2018-12-25 ENCOUNTER — Other Ambulatory Visit: Payer: Self-pay

## 2018-12-25 ENCOUNTER — Ambulatory Visit (INDEPENDENT_AMBULATORY_CARE_PROVIDER_SITE_OTHER): Payer: BC Managed Care – PPO | Admitting: Physician Assistant

## 2018-12-25 VITALS — BP 130/82 | HR 73 | Temp 98.2°F | Wt 166.8 lb

## 2018-12-25 DIAGNOSIS — M5441 Lumbago with sciatica, right side: Secondary | ICD-10-CM

## 2018-12-25 DIAGNOSIS — R05 Cough: Secondary | ICD-10-CM

## 2018-12-25 DIAGNOSIS — R059 Cough, unspecified: Secondary | ICD-10-CM

## 2018-12-25 MED ORDER — GABAPENTIN 300 MG PO CAPS: 300.0000 mg | ORAL_CAPSULE | Freq: Three times a day (TID) | ORAL | 2 refills | Status: DC

## 2018-12-25 MED ORDER — DEXAMETHASONE SODIUM PHOSPHATE 100 MG/10ML IJ SOLN
10.0000 mg | Freq: Once | INTRAMUSCULAR | Status: AC
  Administered 2018-12-25: 10 mg via INTRAMUSCULAR

## 2018-12-25 NOTE — Progress Notes (Signed)
Subjective:    Patient ID: Brett [name], male    DOB: 03-23-1960, 58 y.o.   MRN: [id number]  HPI 58 y.o. AAM presents with leg pain s/p right knee arthroscopy on 10/18/2018.   He saw ortho 12/17/2018 for bilateral end stage knee OA. He was given hydrocodone 30 tablets 10/18/2018 by Dr. [name] and Tramadol 30 tablets 10/05/2018. He is scheduled for left TKR 01/26/2018.  He is having right leg pain, states it is from right hip down his leg, lateral leg down to his foot. He is seeing PT for his knee.   He is having a dry cough in the morning and early part of morning. He denies any CP, SOB, fever, chills, leg swelling, palpitations. No mucus. He does have some nasal drainage, not on allergy pill right now.  He quit smoking in 08/2018, has 27 year history.  He had a CXR 08/2018 showing IMPRESSION: No active cardiopulmonary disease. Low lung volumes with minimal basilar atelectasis  Blood pressure 130/82, pulse 73, temperature 98.2 F (36.8 C), weight 166 lb 12.8 oz (75.7 kg), SpO2 98 %.  Medications    Current Outpatient Medications (Respiratory):  .  albuterol (PROVENTIL HFA;VENTOLIN HFA) 108 (90 Base) MCG/ACT inhaler, Inhale 1-2 puffs into the lungs every 6 (six) hours as needed for wheezing or shortness of breath. Marland Kitchen  azelastine (ASTELIN) 0.1 % nasal spray, Place 2 sprays into both nostrils 2 (two) times daily. Use in each nostril as directed  Current Outpatient Medications (Analgesics):  .  acetaminophen (TYLENOL) 325 MG tablet, Take 1,000 mg by mouth every 6 (six) hours as needed. Marland Kitchen  ibuprofen (ADVIL) 800 MG tablet, Take 1 tablet (800 mg total) by mouth every 8 (eight) hours as needed. Marland Kitchen  ketorolac (TORADOL) 10 MG tablet, Take 1 tablet (10 mg total) by mouth 2 (two) times daily as needed. .  traMADol (ULTRAM) 50 MG tablet, Take 1-2 po q 6-8 hours prn pain   Current Outpatient Medications (Other):  .  blood glucose meter kit and supplies, Test blood sugar twice daily or as  directed by YOUR provider. .  blood glucose meter kit and supplies, Dispense based insurance preference. E11.22 .  cholecalciferol (VITAMIN D) 1000 UNITS tablet, Take 1,000 Units by mouth daily. .  Diclofenac Sodium (PENNSAID) 2 % SOLN, Apply 2 g topically 2 (two) times daily as needed (to affected area). Marland Kitchen  glucose blood test strip, Use as instructed .  GOLDENSEAL PO, Take 2 capsules by mouth 3 (three) times daily. Marland Kitchen  omeprazole (PRILOSEC) 40 MG capsule, Take 1 capsule (40 mg total) by mouth daily. .  vitamin E 400 UNIT capsule, Take 400 Units by mouth daily.  Problem list He has Hyperlipemia; GERD; Medication management; Vitamin D deficiency; Abnormal glucose; Overweight (BMI 25.0-29.9); Complex tear of medial meniscus of left knee as current injury; Complex tear of medial meniscus of right knee as current injury; Primary osteoarthritis of left knee; and Primary osteoarthritis of right knee on their problem list.   Review of Systems  Constitutional: Negative.  Negative for chills, diaphoresis, fatigue, fever and unexpected weight change.  HENT: Positive for congestion, postnasal drip and rhinorrhea. Negative for dental problem, drooling, ear discharge, ear pain, facial swelling, hearing loss, mouth sores, nosebleeds, sinus pressure, sinus pain, sneezing, sore throat, tinnitus, trouble swallowing and voice change.   Respiratory: Positive for cough. Negative for choking, chest tightness, shortness of breath, wheezing and stridor.   Cardiovascular: Negative.  Negative for chest pain, palpitations and leg swelling.  Gastrointestinal: Negative.   Genitourinary: Negative.   Musculoskeletal: Positive for arthralgias, back pain, gait problem, joint swelling and myalgias. Negative for neck pain and neck stiffness.  Skin: Negative.  Negative for color change and rash.       Objective:   Physical Exam Constitutional:      General: He is not in acute distress.    Appearance: He is well-developed.  He is not ill-appearing or diaphoretic.  HENT:     Right Ear: Tympanic membrane normal. There is no impacted cerumen.     Left Ear: Tympanic membrane normal. There is no impacted cerumen.     Nose: Rhinorrhea present.     Mouth/Throat:     Mouth: Mucous membranes are moist.     Pharynx: Oropharynx is clear. Posterior oropharyngeal erythema present. No oropharyngeal exudate.  Cardiovascular:     Rate and Rhythm: Normal rate and regular rhythm.     Pulses: Normal pulses.     Heart sounds: Normal heart sounds. No murmur.  Pulmonary:     Effort: Pulmonary effort is normal.     Breath sounds: Normal breath sounds. No wheezing or rhonchi.  Abdominal:     General: Bowel sounds are normal.     Palpations: Abdomen is soft.     Tenderness: There is no abdominal tenderness.  Musculoskeletal:        General: Tenderness present.     Cervical back: Normal range of motion and neck supple.     Right lower leg: No edema.     Left lower leg: No edema.     Comments: Patient is able to ambulate well. Gait is Antalgic with a cane. Straight leg raising with dorsiflexion negative bilaterally for radicular symptoms. Sensory exam in the legs are normal. Knee reflexes are normal Ankle reflexes are normal Strength is normal and symmetric in arms and legs. There is SI tenderness to palpation.  There is notparaspinal muscle spasm.  There is not midline tenderness.  ROM of spine with  limited in all spheres due to pain.   Lymphadenopathy:     Cervical: No cervical adenopathy.  Skin:    General: Skin is warm and dry.     Findings: No rash.  Neurological:     Mental Status: He is alert and oriented to person, place, and time.     Cranial Nerves: No cranial nerve deficit.           Assessment & Plan:   Cough Cough- multifactorial-  continue on PPI, may need to add H2/refer GI. Get on allergy pill/nasal spray, voice rest, suppress cough with OTC sugar free candy and RX med.   Had CXR 08/2018 normal- if not  better will get Xray before surgery May need PFTS  Acute right-sided low back pain with right-sided sciatica -     dexamethasone (DECADRON) injection 10 mg -     gabapentin (NEURONTIN) 300 MG capsule; Take 1 capsule (300 mg total) by mouth 3 (three) times daily. Follow up with Dr. [name] Negative straight leg raise, no red flag symptoms   Verbal consent obtained, risk and benefits were addressed with patient. A trigger point injection was performed at the site of maximal tenderness using 1% plain Lidocaine and Dexamethasone. This was well tolerated, and followed by immediate relief of pain. Return precautions discussed with the patient.

## 2018-12-25 NOTE — Patient Instructions (Addendum)
INFORMATION ABOUT YOUR XRAY  Can walk into [address] building for an Personal assistant. They will have the order and take you back. You do not any paper work, I should get the result back today or tomorrow. This order is good for a year.  Can call [phone] to schedule an appointment if you wish.   Can take the gabapentin 300mg  at night.   It can make you sleepy so we suggest trying it at night first and please plan to not drive or do anything strenuous.  Also please do not take this medication with alcohol.   Start out 1 pill at night before bed, can increase to 2 pills at night before bed. Please call the office if you have any side effects.   Can take 3 pills a day total however you would like  Some examples: - 1 breakfast, lunch, bedtime. - 1 at breakfast, 2 at bed time  Common side effects are sleepiness, concentration problems, dizziness, swelling.  Do not stop abruptly unless you have a reaction to it.   Get on allergy pill Get on nasal spray If not better get XRay before surgery Take deep breaths Add pepcid at night  Generally a cough is either coming from above or from below- so we will treat this OR it can be from irritation/viral cough  To treat the nasal drip: Get on the chlorphenirmine every 6 hours- This medication can make you sleepy but helps with nasal drip- get from over the counter.   Can do a steroid nasal spary 1-2 sparys at night each nostril. Remember to spray each nostril twice towards the outer part of your eye.  Do not sniff but instead pinch your nose and tilt your head back to help the medicine get into your sinuses.  The best time to do this is at bedtime. Stop if you get blurred vision or nose bleeds.   To treat the reflux Will send in prilosec 40 mg to take once in the morning and take prevacid from over the counter at night for 2 weeks- then stop the prilosec and continue the pravacid or famotadine  To stop irritation: Need to STOP the cough Do sugar  free candy Do the tessalon drops VOICE REST is VERY important  If not better in 2 weeks will refer to ENT  Go to the ER or call the office if you get any chest pain, shortness of breath, severe  headache, leg swelling.    Common causes of cough OR hoarseness OR sore throat:   Allergies, Viral Infections, Acid Reflux and Bacterial Infections.    Allergies and viral infections cause a cough OR sore throat by post nasal drip and are often worse at night, can also have sneezing, lower grade fevers, clear/yellow mucus. This is best treated with allergy medications or nasal sprays.  Please get on allegra for 1-2 weeks The strongest is allegra or fexafinadine  Cheapest at [facility], [facility], [employer]   Bacterial infections are more severe than allergies or viral infections with fever, teeth pain, fatigue. This can be treated with prednisone and the same over the counter medication and after 7 days can be treated with an antibiotic.   Silent reflux/GERD can cause a cough OR sore throat OR hoarseness WITHOUT heart burn because the esophagus that goes to the stomach and trachea that goes to the lungs are very close and when you lay down the acid can irritate your throat and lungs. This can cause hoarseness, cough, and  wheezing. Please stop any alcohol or anti-inflammatories like aleve/advil/ibuprofen and start an over the counter Prilosec or omeprazole 1-2 times daily 94mins before food for 2 weeks, then switch to over the counter zantac/ratinidine or pepcid/famotadine once at night for 2 weeks.    sometimes irritation causes more irritation. Try voice rest, use sugar free cough drops to prevent coughing, and try to stop clearing your throat.   If you ever have a cough that does not go away after trying these things please make a follow up visit for further evaluation or we can refer you to a specialist. Or if you ever have shortness of breath or chest pain go to the ER.    Silent reflux: Not all heartburn  burns...Marland KitchenMarland KitchenMarland Kitchen  What is LPR? Laryngopharyngeal reflux (LPR) or silent reflux is a condition in which acid that is made in the stomach travels up the esophagus (swallowing tube) and gets to the throat. Not everyone with reflux has a lot of heartburn or indigestion. In fact, many people with LPR never have heartburn. This is why LPR is called SILENT REFLUX, and the terms "Silent reflux" and "LPR" are often used interchangeably. Because LPR is silent, it is sometimes difficult to diagnose.  How can you tell if you have LPR?  Marland Kitchen Chronic hoarseness- Some people have hoarseness that comes and goes . throat clearing  . Cough . It can cause shortness of breath and cause asthma like symptoms. Marland Kitchen a feeling of a lump in the throat  . difficulty swallowing . a problem with too much nose and throat drainage.  . Some people will feel their esophagus spasm which feels like their heart beating hard and fast, this will usually be after a meal, at rest, or lying down at night.    How do I treat this? Treatment for LPR should be individualized, and your doctor will suggest the best treatment for you. Generally there are several treatments for LPR: . changing habits and diet to reduce reflux,  . medications to reduce stomach acid, and  . surgery to prevent reflux. Most people with LPR need to modify how and when they eat, as well as take some medication, to get well. Sometimes, nonprescription liquid antacids, such as Maalox, Gelucil and Mylanta are recommended. When used, these antacids should be taken four times each day - one tablespoon one hour after each meal and before bedtime. Dietary and lifestyle changes alone are not often enough to control LPR - medications that reduce stomach acid are also usually needed. These must be prescribed by our doctor.   TIPS FOR REDUCING REFLUX AND LPR Control your LIFE-STYLE and your DIET! Marland Kitchen If you use tobacco, QUIT.  Marland Kitchen Smoking makes you reflux. After every cigarette you  have some LPR.  . Don't wear clothing that is too tight, especially around the waist (trousers, corsets, belts).  . Do not lie down just after eating...in fact, do not eat within three hours of bedtime.  . You should be on a low-fat diet.  . Limit your intake of red meat.  . Limit your intake of butter.  Marland Kitchen Avoid fried foods.  . Avoid chocolate  . Avoid cheese.  Marland Kitchen Avoid eggs. Marland Kitchen Specifically avoid caffeine (especially coffee and tea), soda pop (especially cola) and mints.  . Avoid alcoholic beverages, particularly in the evening.

## 2018-12-29 ENCOUNTER — Other Ambulatory Visit: Payer: Self-pay | Admitting: Internal Medicine

## 2018-12-29 MED ORDER — IBUPROFEN 800 MG PO TABS: ORAL_TABLET | ORAL | 0 refills | Status: DC

## 2018-12-31 ENCOUNTER — Ambulatory Visit: Payer: BC Managed Care – PPO | Admitting: Physician Assistant

## 2019-01-01 ENCOUNTER — Telehealth: Payer: Self-pay | Admitting: Orthopaedic Surgery

## 2019-01-01 NOTE — Telephone Encounter (Signed)
Pt called in said he has some issues with his benefits being denied due to the paperwork being filled out the wrong way by dr.xu, and he is needing to speak to someone about this because he believes there is miscommunication. Please give him a call.   [phone]  [phone]

## 2019-01-02 ENCOUNTER — Other Ambulatory Visit: Payer: Self-pay

## 2019-01-02 ENCOUNTER — Ambulatory Visit (INDEPENDENT_AMBULATORY_CARE_PROVIDER_SITE_OTHER): Payer: BC Managed Care – PPO | Admitting: Orthopaedic Surgery

## 2019-01-02 ENCOUNTER — Telehealth: Payer: Self-pay | Admitting: Orthopaedic Surgery

## 2019-01-02 ENCOUNTER — Encounter: Payer: Self-pay | Admitting: Physician Assistant

## 2019-01-02 ENCOUNTER — Ambulatory Visit: Payer: Self-pay

## 2019-01-02 DIAGNOSIS — M545 Low back pain, unspecified: Secondary | ICD-10-CM

## 2019-01-02 MED ORDER — METHOCARBAMOL 500 MG PO TABS: 500.0000 mg | ORAL_TABLET | Freq: Two times a day (BID) | ORAL | 0 refills | Status: DC | PRN

## 2019-01-02 MED ORDER — PREDNISONE 10 MG (21) PO TBPK: ORAL_TABLET | ORAL | 0 refills | Status: DC

## 2019-01-02 NOTE — Telephone Encounter (Signed)
LMOM advising the patient of the [facility] number and the name of the person he would need to speak to.  Thank you.

## 2019-01-02 NOTE — Progress Notes (Signed)
Office Visit Note   Patient: Brett [name]           Date of Birth: [birth year]           MRN: [id number] Visit Date: 01/02/2019              Requested by: Lucky Cowboy, MD [address] [place],  [place] [address] PCP: Lucky Cowboy, MD   Assessment & Plan: Visit Diagnoses:  1. Lumbar pain     Plan: Impression is right-sided lumbar radiculopathy.  We will start the patient on a Sterapred taper and muscle relaxer.  He will follow-up with [place] as needed.  Follow-Up Instructions: Return if symptoms worsen or fail to improve.   Orders:  Orders Placed This Encounter  Procedures  . XR Lumbar Spine 2-3 Views   Meds ordered this encounter  Medications  . predniSONE (STERAPRED UNI-PAK 21 TAB) 10 MG (21) TBPK tablet    Sig: Take as directed    Dispense:  21 tablet    Refill:  0  . methocarbamol (ROBAXIN) 500 MG tablet    Sig: Take 1 tablet (500 mg total) by mouth 2 (two) times daily as needed.    Dispense:  20 tablet    Refill:  0      Procedures: No procedures performed   Clinical Data: No additional findings.   Subjective: No chief complaint on file.   HPI patient is a 58 year old gentleman who comes in today with right-sided lower back pain and right lower extremity radiculopathy.  He noticed this about 1 to 2 weeks ago.  No known injury or change in activity.  The pain he has is to the right lower back and radiates to his buttocks and down the lateral leg into his shin.  Pain is worse with standing or walking.  He has tried Advil without relief of symptoms.  He does note burning in his leg when he is standing.  No history of lumbar pathology.   Review of Systems as detailed in HPI.  All others reviewed and negative.   Objective: Vital Signs: There were no vitals taken for this visit.  Physical Exam well-developed well-nourished gentleman in no acute distress.  Alert oriented x3.  Ortho Exam examination of his lumbar spine reveals mild  paraspinous tenderness on the right.  Increased pain with flexion.  Positive straight leg raise.  Negative logroll negative FADIR.  No focal weakness.  He is neurovascular intact distally.  Specialty Comments:  No specialty comments available.  Imaging: XR Lumbar Spine 2-3 Views  Result Date: 01/02/2019 Diffuse degenerative disc disease    PMFS History: Patient Active Problem List   Diagnosis Date Noted  . Primary osteoarthritis of left knee 12/17/2018  . Primary osteoarthritis of right knee 12/17/2018  . Complex tear of medial meniscus of left knee as current injury 08/06/2018  . Complex tear of medial meniscus of right knee as current injury 08/06/2018  . Overweight (BMI 25.0-29.9) 08/08/2017  . Medication management 03/23/2014  . Vitamin D deficiency 03/23/2014  . Abnormal glucose 03/23/2014  . Hyperlipemia 10/31/2006  . GERD 10/31/2006   Past Medical History:  Diagnosis Date  . Allergy    albuterol as needed  . Anal fissure   . Arthritis   . Colon polyps 01/13/11   Colonoscopy  . Diabetes mellitus    pre-diabetic, no meds needed  . GERD (gastroesophageal reflux disease)     Family History  Problem Relation Age of Onset  . Rheum  arthritis Mother   . Allergies Daughter   . Allergies Sister   . Asthma Daughter     Past Surgical History:  Procedure Laterality Date  . ANAL FISSURE REPAIR  02/24/11  . CHONDROPLASTY  08/14/2018   Procedure: CHONDROPLASTY;  Surgeon: Leandrew Koyanagi, MD;  Location: [facility];  Service: Orthopedics;;  . COLONOSCOPY    . KNEE ARTHROSCOPY WITH MEDIAL MENISECTOMY Left 08/14/2018   Procedure: LEFT KNEE ARTHROSCOPY WITH PARTIAL MEDIAL MENISCECTOMY;  Surgeon: Leandrew Koyanagi, MD;  Location: [facility];  Service: Orthopedics;  Laterality: Left;  . KNEE ARTHROSCOPY WITH MEDIAL MENISECTOMY Right 10/18/2018   Procedure: RIGHT KNEE ARTHROSCOPY WITH PARTIAL MEDIAL MENISCECTOMY;  Surgeon: Leandrew Koyanagi, MD;  Location: [facility];  Service: Orthopedics;  Laterality: Right;   Social History   Occupational History    Employer: [employer]  Tobacco Use  . Smoking status: Former Smoker    Packs/day: 0.00    Years: 27.00    Pack years: 0.00    Types: Cigarettes    Quit date: 08/20/2018    Years since quitting: 0.3  . Smokeless tobacco: Never Used  Substance and Sexual Activity  . Alcohol use: Not Currently    Comment: socially  . Drug use: No  . Sexual activity: Not on file

## 2019-01-02 NOTE — Telephone Encounter (Signed)
Dr. [name] is out of the office.

## 2019-01-02 NOTE — Telephone Encounter (Signed)
Patient has a scheduled appt 2 pm will discuss then.

## 2019-01-02 NOTE — Telephone Encounter (Signed)
Patient called. He asked to speak with Dr.Xu and to be seen as soon as possible.  He was given a same day appointment with Susann Givens.   Call back number: [phone]

## 2019-01-02 NOTE — Telephone Encounter (Signed)
Can you please call him and give him [facility] phone number as they are the ones to fill out paper work. Thanks.

## 2019-01-05 ENCOUNTER — Other Ambulatory Visit: Payer: Self-pay | Admitting: Physician Assistant

## 2019-01-07 ENCOUNTER — Telehealth: Payer: Self-pay | Admitting: Orthopaedic Surgery

## 2019-01-07 ENCOUNTER — Other Ambulatory Visit: Payer: Self-pay | Admitting: Physician Assistant

## 2019-01-07 MED ORDER — TRAMADOL HCL 50 MG PO TABS: ORAL_TABLET | ORAL | 1 refills | Status: DC

## 2019-01-07 MED ORDER — METHOCARBAMOL 500 MG PO TABS: 500.0000 mg | ORAL_TABLET | Freq: Two times a day (BID) | ORAL | 0 refills | Status: DC | PRN

## 2019-01-07 NOTE — Telephone Encounter (Signed)
Sent in robaxin and tramadol

## 2019-01-07 NOTE — Telephone Encounter (Signed)
Patient called requesting a refill on medications Tramadol, methocarbamol, and prednisone. Patient asked for a call back to confirm medications needed. Pharmacy patient uses [facility]. Patient phone number is [phone].

## 2019-01-08 ENCOUNTER — Other Ambulatory Visit: Payer: Self-pay

## 2019-01-08 DIAGNOSIS — Z20822 Contact with and (suspected) exposure to covid-19: Secondary | ICD-10-CM

## 2019-01-09 LAB — NOVEL CORONAVIRUS, NAA: SARS-CoV-2, NAA: NOT DETECTED

## 2019-01-10 ENCOUNTER — Telehealth: Payer: Self-pay | Admitting: Internal Medicine

## 2019-01-10 ENCOUNTER — Telehealth: Payer: Self-pay

## 2019-01-10 NOTE — Telephone Encounter (Signed)
Faxed work note and updated form Dr. Roda Shutters added to [place]

## 2019-01-10 NOTE — Telephone Encounter (Signed)
Pt aware covid lab test negative, not detected °

## 2019-01-13 ENCOUNTER — Telehealth: Payer: Self-pay | Admitting: Orthopaedic Surgery

## 2019-01-13 NOTE — Telephone Encounter (Signed)
Patient called advised he is going to fax a form to Dr Roda Shutters to completeconcerning his disability. Patient said he is appealing the denial for disability. The number to contact patient is [phone]

## 2019-01-13 NOTE — Telephone Encounter (Signed)
FYI

## 2019-01-14 ENCOUNTER — Other Ambulatory Visit: Payer: Self-pay

## 2019-01-14 ENCOUNTER — Encounter: Payer: Self-pay | Admitting: Orthopaedic Surgery

## 2019-01-14 ENCOUNTER — Ambulatory Visit (INDEPENDENT_AMBULATORY_CARE_PROVIDER_SITE_OTHER): Payer: BC Managed Care – PPO | Admitting: Orthopaedic Surgery

## 2019-01-14 DIAGNOSIS — M545 Low back pain, unspecified: Secondary | ICD-10-CM

## 2019-01-14 DIAGNOSIS — M4807 Spinal stenosis, lumbosacral region: Secondary | ICD-10-CM

## 2019-01-14 MED ORDER — PREDNISONE 10 MG (21) PO TBPK: ORAL_TABLET | ORAL | 0 refills | Status: DC

## 2019-01-14 NOTE — Progress Notes (Signed)
Office Visit Note   Patient: Brett [name]           Date of Birth: 06/20/1960           MRN: [id number] Visit Date: 01/14/2019              Requested by: Lucky Cowboy, MD [address] [place],  [place] [address] PCP: Lucky Cowboy, MD   Assessment & Plan: Visit Diagnoses:  1. Lumbar pain     Plan: Impression is continued lumbar radiculopathy.  We will reorder prednisone Dosepak to help with the symptoms.  We do need to move forward with MRI at this point to rule out structural normalities.  Follow-Up Instructions: Return if symptoms worsen or fail to improve.   Orders:  No orders of the defined types were placed in this encounter.  Meds ordered this encounter  Medications  . predniSONE (STERAPRED UNI-PAK 21 TAB) 10 MG (21) TBPK tablet    Sig: Take as directed    Dispense:  21 tablet    Refill:  0      Procedures: No procedures performed   Clinical Data: No additional findings.   Subjective: Chief Complaint  Patient presents with  . Lower Back - Pain    Shriyans is here for continued back pain and radicular right leg pain.  Prednisone has only given him partial relief.   Review of Systems  Constitutional: Negative.   All other systems reviewed and are negative.    Objective: Vital Signs: There were no vitals taken for this visit.  Physical Exam Vitals and nursing note reviewed.  Constitutional:      Appearance: He is well-developed.  Pulmonary:     Effort: Pulmonary effort is normal.  Abdominal:     Palpations: Abdomen is soft.  Skin:    General: Skin is warm.  Neurological:     Mental Status: He is alert and oriented to person, place, and time.  Psychiatric:        Behavior: Behavior normal.        Thought Content: Thought content normal.        Judgment: Judgment normal.     Ortho Exam Right lower extremity exam is nonfocal. Specialty Comments:  No specialty comments available.  Imaging: No results  found.   PMFS History: Patient Active Problem List   Diagnosis Date Noted  . Primary osteoarthritis of left knee 12/17/2018  . Primary osteoarthritis of right knee 12/17/2018  . Complex tear of medial meniscus of left knee as current injury 08/06/2018  . Complex tear of medial meniscus of right knee as current injury 08/06/2018  . Overweight (BMI 25.0-29.9) 08/08/2017  . Medication management 03/23/2014  . Vitamin D deficiency 03/23/2014  . Abnormal glucose 03/23/2014  . Hyperlipemia 10/31/2006  . GERD 10/31/2006   Past Medical History:  Diagnosis Date  . Allergy    albuterol as needed  . Anal fissure   . Arthritis   . Colon polyps 01/13/11   Colonoscopy  . Diabetes mellitus    pre-diabetic, no meds needed  . GERD (gastroesophageal reflux disease)     Family History  Problem Relation Age of Onset  . Rheum arthritis Mother   . Allergies Daughter   . Allergies Sister   . Asthma Daughter     Past Surgical History:  Procedure Laterality Date  . ANAL FISSURE REPAIR  02/24/11  . CHONDROPLASTY  08/14/2018   Procedure: CHONDROPLASTY;  Surgeon: Tarry Kos, MD;  Location: [facility];  Service: Orthopedics;;  . COLONOSCOPY    . KNEE ARTHROSCOPY WITH MEDIAL MENISECTOMY Left 08/14/2018   Procedure: LEFT KNEE ARTHROSCOPY WITH PARTIAL MEDIAL MENISCECTOMY;  Surgeon: Leandrew Koyanagi, MD;  Location: [facility];  Service: Orthopedics;  Laterality: Left;  . KNEE ARTHROSCOPY WITH MEDIAL MENISECTOMY Right 10/18/2018   Procedure: RIGHT KNEE ARTHROSCOPY WITH PARTIAL MEDIAL MENISCECTOMY;  Surgeon: Leandrew Koyanagi, MD;  Location: [facility];  Service: Orthopedics;  Laterality: Right;   Social History   Occupational History    Employer: [employer]  Tobacco Use  . Smoking status: Former Smoker    Packs/day: 0.00    Years: 27.00    Pack years: 0.00    Types: Cigarettes    Quit date: 08/20/2018    Years since quitting: 0.4  . Smokeless tobacco: Never  Used  Substance and Sexual Activity  . Alcohol use: Not Currently    Comment: socially  . Drug use: No  . Sexual activity: Not on file

## 2019-01-15 NOTE — Progress Notes (Signed)
[facility] [address] ([address] [facility] - [address] [place] ([place]) [place] [address] Phone: [phone] Fax: [phone]  [facility] [place] - [address] [place] [address] Phone: [phone] Fax: [phone]    Your procedure is scheduled on Friday, January 22nd.  Report to [facility] Main Entrance "A" at 11:20 A.M., and check in at the Admitting office.  Call this number if you have problems the morning of surgery:  [phone]  Call [phone] if you have any questions prior to your surgery date Monday-Friday 8am-4pm   Remember:  Do not eat after midnight the night before your surgery  You may drink clear liquids until 10:20 A.M. the morning of your surgery.   Clear liquids allowed are: Water, Non-Citrus Juices (without pulp), Carbonated Beverages, Clear Tea, Black Coffee Only, and Gatorade   Enhanced Recovery after Surgery for Orthopedics Enhanced Recovery after Surgery is a protocol used to improve the stress on your body and your recovery after surgery.  Patient Instructions  . The night before surgery:  o No food after midnight. ONLY clear liquids after midnight  . The day of surgery (if you have diabetes or are pre-diabetic): o Drink ONE (1) 10oz bottle of water as directed. o This water was given to you during your hospital  pre-op appointment visit. Marland Kitchen  Please drink the water by 10:20 A.M. the morning of surgery o Nothing else to drink after completing the  10oz bottle of water.         If you have questions, please contact your surgeon's office.   Take these medicines the morning of surgery with A SIP OF WATER  gabapentin (NEURONTIN)  omeprazole (PRILOSEC)  traMADol (ULTRAM)   If needed - methocarbamol (ROBAXIN)   7 days prior to surgery STOP taking any Aspirin (unless otherwise instructed by your surgeon), Diclofenac Sodium (PENNSAID),  Aleve, Naproxen, Ibuprofen,  Motrin, Advil, Goody's, BC's, all herbal medications, fish oil, and all vitamins.   The Morning of Surgery  Do not wear jewelry.  Do not wear lotions, powders, or perfumes, or deodorant  Do not shave 48 hours prior to surgery.   Do not bring valuables to the hospital.  [facility] is not responsible for any belongings or valuables.  If you are a smoker, DO NOT Smoke 24 hours prior to surgery  If you wear a CPAP at night please bring your mask, tubing, and machine the morning of surgery   Remember that you must have someone to transport you home after your surgery, and remain with you for 24 hours if you are discharged the same day.  Please bring cases for contacts, glasses, hearing aids, dentures or bridgework because it cannot be worn into surgery.   Leave your suitcase in the car.  After surgery it may be brought to your room.  For patients admitted to the hospital, discharge time will be determined by your treatment team.  Patients discharged the day of surgery will not be allowed to drive home.   Special instructions:   [facility]- Preparing For Surgery  Before surgery, you can play an important role. Because skin is not sterile, your skin needs to be as free of germs as possible. You can reduce the number of germs on your skin by washing with CHG (chlorahexidine gluconate) Soap before surgery.  CHG is an antiseptic cleaner which kills germs and bonds with the skin to continue killing  germs even after washing.    Oral Hygiene is also important to reduce your risk of infection.  Remember - BRUSH YOUR TEETH THE MORNING OF SURGERY WITH YOUR REGULAR TOOTHPASTE  Please do not use if you have an allergy to CHG or antibacterial soaps. If your skin becomes reddened/irritated stop using the CHG.  Do not shave (including legs and underarms) for at least 48 hours prior to first CHG shower. It is OK to shave your face.  Please follow these instructions carefully.   1. Shower the NIGHT  BEFORE SURGERY and the MORNING OF SURGERY with CHG Soap.   2. If you chose to wash your hair, wash your hair first as usual with your normal shampoo.  3. After you shampoo, rinse your hair and body thoroughly to remove the shampoo.  4. Use CHG as you would any other liquid soap. You can apply CHG directly to the skin and wash gently with a scrungie or a clean washcloth.   5. Apply the CHG Soap to your body ONLY FROM THE NECK DOWN.  Do not use on open wounds or open sores. Avoid contact with your eyes, ears, mouth and genitals (private parts). Wash Face and genitals (private parts)  with your normal soap.   6. Wash thoroughly, paying special attention to the area where your surgery will be performed.  7. Thoroughly rinse your body with warm water from the neck down.  8. DO NOT shower/wash with your normal soap after using and rinsing off the CHG Soap.  9. Pat yourself dry with a CLEAN TOWEL.  10. Wear CLEAN PAJAMAS to bed the night before surgery, wear comfortable clothes the morning of surgery  11. Place CLEAN SHEETS on your bed the night of your first shower and DO NOT SLEEP WITH PETS.  Day of Surgery:  Please shower the morning of surgery with the CHG soap Do not apply any deodorants/lotions. Please wear clean clothes to the hospital/surgery center.   Remember to brush your teeth WITH YOUR REGULAR TOOTHPASTE.  Please read over the following fact sheets that you were given.

## 2019-01-16 ENCOUNTER — Encounter (HOSPITAL_COMMUNITY)
Admission: RE | Admit: 2019-01-16 | Discharge: 2019-01-16 | Disposition: A | Discharge status: Home / Self Care | Payer: BC Managed Care – PPO | Source: Ambulatory Visit | Attending: Orthopaedic Surgery | Admitting: Orthopaedic Surgery

## 2019-01-16 ENCOUNTER — Other Ambulatory Visit: Payer: Self-pay

## 2019-01-16 ENCOUNTER — Encounter (HOSPITAL_COMMUNITY): Payer: Self-pay

## 2019-01-16 DIAGNOSIS — Z01812 Encounter for preprocedural laboratory examination: Secondary | ICD-10-CM | POA: Diagnosis not present

## 2019-01-16 HISTORY — DX: Pneumonia, unspecified organism: J18.9

## 2019-01-16 HISTORY — DX: Prediabetes: R73.03

## 2019-01-16 LAB — COMPREHENSIVE METABOLIC PANEL
ALT: 44 U/L (ref 0–44)
AST: 24 U/L (ref 15–41)
Albumin: 3.9 g/dL (ref 3.5–5.0)
Alkaline Phosphatase: 87 U/L (ref 38–126)
Anion gap: 11 (ref 5–15)
BUN: 16 mg/dL (ref 6–20)
CO2: 20 mmol/L — ABNORMAL LOW (ref 22–32)
Calcium: 9.4 mg/dL (ref 8.9–10.3)
Chloride: 110 mmol/L (ref 98–111)
Creatinine, Ser: 1.21 mg/dL (ref 0.61–1.24)
GFR calc Af Amer: >60 mL/min (ref >60)
GFR calc non Af Amer: >60 mL/min (ref >60)
Glucose, Bld: 127 mg/dL — ABNORMAL HIGH (ref 70–99)
Potassium: 4.4 mmol/L (ref 3.5–5.1)
Sodium: 141 mmol/L (ref 135–145)
Total Bilirubin: 0.4 mg/dL (ref 0.3–1.2)
Total Protein: 7.2 g/dL (ref 6.5–8.1)

## 2019-01-16 LAB — CBC WITH DIFFERENTIAL/PLATELET
Abs Immature Granulocytes: 0.04 10*3/uL (ref 0.00–0.07)
Basophils Absolute: 0 10*3/uL (ref 0.0–0.1)
Basophils Relative: 0 %
Eosinophils Absolute: 0 10*3/uL (ref 0.0–0.5)
Eosinophils Relative: 0 %
HCT: 43.4 % (ref 39.0–52.0)
Hemoglobin: 13.5 g/dL (ref 13.0–17.0)
Immature Granulocytes: 0 %
Lymphocytes Relative: 12 %
Lymphs Abs: 1.2 10*3/uL (ref 0.7–4.0)
MCH: 26 pg (ref 26.0–34.0)
MCHC: 31.1 g/dL (ref 30.0–36.0)
MCV: 83.6 fL (ref 80.0–100.0)
Monocytes Absolute: 0.2 10*3/uL (ref 0.1–1.0)
Monocytes Relative: 2 %
Neutro Abs: 8.8 10*3/uL — ABNORMAL HIGH (ref 1.7–7.7)
Neutrophils Relative %: 86 %
Platelets: 225 10*3/uL (ref 150–400)
RBC: 5.19 MIL/uL (ref 4.22–5.81)
RDW: 13.1 % (ref 11.5–15.5)
WBC: 10.2 10*3/uL (ref 4.0–10.5)
nRBC: 0 % (ref 0.0–0.2)

## 2019-01-16 LAB — TYPE AND SCREEN
ABO/RH(D): B POS
Antibody Screen: NEGATIVE

## 2019-01-16 LAB — SURGICAL PCR SCREEN
MRSA, PCR: NEGATIVE
Staphylococcus aureus: NEGATIVE

## 2019-01-16 LAB — ABO/RH: ABO/RH(D): B POS

## 2019-01-16 LAB — PROTIME-INR
INR: 0.9 (ref 0.8–1.2)
Prothrombin Time: 12.3 seconds (ref 11.4–15.2)

## 2019-01-16 LAB — APTT: aPTT: 27 seconds (ref 24–36)

## 2019-01-16 LAB — HEMOGLOBIN A1C
Hgb A1c MFr Bld: 6.2 % — ABNORMAL HIGH (ref 4.8–5.6)
Mean Plasma Glucose: 131.24 mg/dL

## 2019-01-16 LAB — GLUCOSE, CAPILLARY: Glucose-Capillary: 121 mg/dL — ABNORMAL HIGH (ref 70–99)

## 2019-01-16 NOTE — Progress Notes (Signed)
PCP - Dr. Oneta Rack Cardiologist - denies  Chest x-ray - 08/22/18 EKG - 08/22/18 Stress Test - denies ECHO - denies Cardiac Cath - denies  Fasting Blood Sugar - 80-87 Checks Blood Sugar __1-2___ times a day (when he thinks about it)  ERAS Protcol - Yes PRE-SURGERY Ensure or G2- 10 ounce Water  COVID TEST- to be done 01/21/19  Visitation policy reviewed with pt and he voiced understanding.  Anesthesia review: no  Patient denies shortness of breath, fever, cough and chest pain at PAT appointment   All instructions explained to the patient, with a verbal understanding of the material. Patient agrees to go over the instructions while at home for a better understanding. Patient also instructed to self quarantine after being tested for COVID-19. The opportunity to ask questions was provided.

## 2019-01-16 NOTE — Pre-Procedure Instructions (Signed)
Brett [name]  01/16/2019     Your procedure is scheduled on Friday, January 24, 2019 at 1:22 PM.   Report to [facility] Entrance "A" Admitting Office at 11:20 AM.   Call this number if you have problems the morning of surgery: [phone]   Questions prior to day of surgery, please call [phone] between 8 & 4 PM.   Remember:  Do not eat food after midnight Thursday, 01/23/19.  You may drink clear liquids until 10:20 AM.  Clear liquids allowed are: Water, Juice (non-citric and without pulp), Carbonated beverages, Clear Tea, Black Coffee only, Plain Jell-O only, Gatorade and Plain Popsicles only   Drink 10 ounces water between 10:10 and 10:20 AM day of surgery.    Take these medicines the morning of surgery with A SIP OF WATER: Gabapentin (Neurontin), Omeprazole (Prilosec), Prednisone, Tramadol (Ultram), Methocarbamol (Robaxin) - if needed.   How to Manage Your Diabetes Before Surgery   Why is it important to control my blood sugar before and after surgery?   Improving blood sugar levels before and after surgery helps healing and can limit problems.  A way of improving blood sugar control is eating a healthy diet by:  - Eating less sugar and carbohydrates  - Increasing activity/exercise  - Talk with your doctor about reaching your blood sugar goals  High blood sugars (greater than 180 mg/dL) can raise your risk of infections and slow down your recovery so you will need to focus on controlling your diabetes during the weeks before surgery.  Make sure that the doctor who takes care of your diabetes knows about your planned surgery including the date and location.  How do I manage my blood sugars before surgery?   Check your blood sugar at least 4 times a day, 2 days before surgery to make sure that they are not too high or low.  Check your blood sugar the morning of your surgery when you wake up and every 2 hours until you get to the Short-Stay unit.  Treat a  low blood sugar (less than 70 mg/dL) with 1/2 cup of clear juice (cranberry or apple), 4 glucose tablets, OR glucose gel.  Recheck blood sugar in 15 minutes after treatment (to make sure it is greater than 70 mg/dL).  If blood sugar is not greater than 70 mg/dL on re-check, call [phone] for further instructions.   Report your blood sugar to the Short-Stay nurse when you get to Short-Stay.  References:  [facility], 2007 "How to Manage your Diabetes Before and After Surgery".   Do not wear jewelry.  Do not wear lotions, powders, cologne or deodorant.  Do not shave 48 hours prior to surgery.  Men may shave face and neck.  Do not bring valuables to the hospital.  [facility] is not responsible for any belongings or valuables.  Contacts, dentures or bridgework may not be worn into surgery.  Leave your suitcase in the car.  After surgery it may be brought to your room.  For patients admitted to the hospital, discharge time will be determined by your treatment team.  Patients discharged the day of surgery will not be allowed to drive home.   [place] - Preparing for Surgery  Before surgery, you can play an important role.  Because skin is not sterile, your skin needs to be as free of germs as possible.  You can reduce the number of germs on you skin by washing with CHG (chlorahexidine  gluconate) soap before surgery.  CHG is an antiseptic cleaner which kills germs and bonds with the skin to continue killing germs even after washing.  Oral Hygiene is also important in reducing the risk of infection.  Remember to brush your teeth with your regular toothpaste the morning of surgery.  Please DO NOT use if you have an allergy to CHG or antibacterial soaps.  If your skin becomes reddened/irritated stop using the CHG and inform your nurse when you arrive at Short Stay.  Do not shave (including legs and underarms) for at least 48 hours prior to the first CHG shower.   You may shave your face.  Please follow these instructions carefully:   1.  Shower with CHG Soap the night before surgery and the morning of Surgery.  2.  If you choose to wash your hair, wash your hair first as usual with your normal shampoo.  3.  After you shampoo, rinse your hair and body thoroughly to remove the shampoo. 4.  Use CHG as you would any other liquid soap.  You can apply chg directly to the skin and wash gently with a      scrungie or washcloth.           5.  Apply the CHG Soap to your body ONLY FROM THE NECK DOWN.   Do not use on open wounds or open sores. Avoid contact with your eyes, ears, mouth and genitals (private parts).  Wash genitals (private parts) with your normal soap.  6.  Wash thoroughly, paying special attention to the area where your surgery will be performed.  7.  Thoroughly rinse your body with warm water from the neck down.  8.  DO NOT shower/wash with your normal soap after using and rinsing off the CHG Soap.  9.  Pat yourself dry with a clean towel.            10.  Wear clean pajamas.            11.  Place clean sheets on your bed the night of your first shower and do not sleep with pets.  Day of Surgery  Shower as above. Do not apply any lotions/deodorants the morning of surgery.   Please wear clean clothes to the hospital. Remember to brush your teeth with toothpaste.  Please read over the fact sheets that you were given.

## 2019-01-17 ENCOUNTER — Ambulatory Visit: Payer: Self-pay

## 2019-01-17 ENCOUNTER — Encounter: Payer: Self-pay | Admitting: Orthopaedic Surgery

## 2019-01-17 ENCOUNTER — Telehealth: Payer: Self-pay | Admitting: Orthopaedic Surgery

## 2019-01-17 NOTE — Telephone Encounter (Signed)
Pt called in said that [facility] and they told the pt that they cant get him in for a mri until 01-25-2019 and he is wanting to let dr.xu know since he did want the pt to have the mri done asap before surgery on 01-24-2019.  [phone]

## 2019-01-17 NOTE — Telephone Encounter (Signed)
Can we get this ASAP. He will be having SU. Thanks.

## 2019-01-17 NOTE — Telephone Encounter (Signed)
I called pt and we discussed that I can get him in tomorrow morning at [facility] pt agreed to going tomorrow at 8am.

## 2019-01-18 ENCOUNTER — Other Ambulatory Visit: Payer: Self-pay | Admitting: Internal Medicine

## 2019-01-18 ENCOUNTER — Other Ambulatory Visit: Payer: Self-pay

## 2019-01-18 ENCOUNTER — Ambulatory Visit (HOSPITAL_BASED_OUTPATIENT_CLINIC_OR_DEPARTMENT_OTHER)
Admission: RE | Admit: 2019-01-18 | Discharge: 2019-01-18 | Disposition: A | Discharge status: Home / Self Care | Payer: BC Managed Care – PPO | Source: Ambulatory Visit | Attending: Orthopaedic Surgery | Admitting: Orthopaedic Surgery

## 2019-01-18 DIAGNOSIS — M4807 Spinal stenosis, lumbosacral region: Secondary | ICD-10-CM

## 2019-01-18 MED ORDER — GABAPENTIN 600 MG PO TABS: ORAL_TABLET | ORAL | 1 refills | Status: DC

## 2019-01-20 NOTE — Progress Notes (Signed)
error 

## 2019-01-21 ENCOUNTER — Other Ambulatory Visit (HOSPITAL_COMMUNITY)
Admission: RE | Admit: 2019-01-21 | Discharge: 2019-01-21 | Disposition: A | Discharge status: Home / Self Care | Payer: BC Managed Care – PPO | Source: Ambulatory Visit | Attending: Orthopaedic Surgery | Admitting: Orthopaedic Surgery

## 2019-01-21 ENCOUNTER — Telehealth: Payer: Self-pay | Admitting: Orthopaedic Surgery

## 2019-01-21 DIAGNOSIS — Z01812 Encounter for preprocedural laboratory examination: Secondary | ICD-10-CM | POA: Insufficient documentation

## 2019-01-21 DIAGNOSIS — Z20822 Contact with and (suspected) exposure to covid-19: Secondary | ICD-10-CM | POA: Diagnosis not present

## 2019-01-21 NOTE — Telephone Encounter (Signed)
Patient called requesting MRI review results read over the phone. Patient states he has upcoming surgery Friday 01/24/19. Patient also states he is quarantined and awaiting Covid- 19 results. Patient requesting call back. Patient phone number is [phone].

## 2019-01-21 NOTE — Telephone Encounter (Signed)
Spoke to patient

## 2019-01-22 ENCOUNTER — Other Ambulatory Visit: Payer: Self-pay | Admitting: *Deleted

## 2019-01-22 DIAGNOSIS — M1711 Unilateral primary osteoarthritis, right knee: Secondary | ICD-10-CM

## 2019-01-22 LAB — NOVEL CORONAVIRUS, NAA (HOSP ORDER, SEND-OUT TO REF LAB; TAT 18-24 HRS): SARS-CoV-2, NAA: NOT DETECTED

## 2019-01-23 MED ORDER — TRANEXAMIC ACID 1000 MG/10ML IV SOLN
2000.0000 mg | INTRAVENOUS | Status: DC
  Filled 2019-01-23 (×2): qty 20

## 2019-01-24 ENCOUNTER — Encounter (HOSPITAL_COMMUNITY)
Admission: RE | Disposition: A | Discharge status: Home / Self Care | Payer: Self-pay | Source: Home / Self Care | Attending: Orthopaedic Surgery

## 2019-01-24 ENCOUNTER — Observation Stay (HOSPITAL_COMMUNITY)
Admission: RE | Admit: 2019-01-24 | Discharge: 2019-01-25 | Disposition: A | Discharge status: Home / Self Care | Payer: BC Managed Care – PPO | Attending: Orthopaedic Surgery | Admitting: Orthopaedic Surgery

## 2019-01-24 ENCOUNTER — Ambulatory Visit (HOSPITAL_COMMUNITY): Payer: BC Managed Care – PPO | Admitting: Anesthesiology

## 2019-01-24 ENCOUNTER — Other Ambulatory Visit: Payer: Self-pay

## 2019-01-24 ENCOUNTER — Encounter (HOSPITAL_COMMUNITY): Payer: Self-pay | Admitting: Orthopaedic Surgery

## 2019-01-24 ENCOUNTER — Observation Stay (HOSPITAL_COMMUNITY): Payer: BC Managed Care – PPO

## 2019-01-24 ENCOUNTER — Other Ambulatory Visit: Payer: Self-pay | Admitting: Orthopaedic Surgery

## 2019-01-24 DIAGNOSIS — Z96652 Presence of left artificial knee joint: Secondary | ICD-10-CM

## 2019-01-24 DIAGNOSIS — Z79899 Other long term (current) drug therapy: Secondary | ICD-10-CM | POA: Diagnosis not present

## 2019-01-24 DIAGNOSIS — M1712 Unilateral primary osteoarthritis, left knee: Secondary | ICD-10-CM

## 2019-01-24 DIAGNOSIS — R7303 Prediabetes: Secondary | ICD-10-CM | POA: Diagnosis not present

## 2019-01-24 DIAGNOSIS — Z880 Allergy status to penicillin: Secondary | ICD-10-CM | POA: Diagnosis not present

## 2019-01-24 DIAGNOSIS — Z888 Allergy status to other drugs, medicaments and biological substances status: Secondary | ICD-10-CM | POA: Insufficient documentation

## 2019-01-24 DIAGNOSIS — K219 Gastro-esophageal reflux disease without esophagitis: Secondary | ICD-10-CM | POA: Diagnosis not present

## 2019-01-24 DIAGNOSIS — Z87891 Personal history of nicotine dependence: Secondary | ICD-10-CM | POA: Insufficient documentation

## 2019-01-24 DIAGNOSIS — M25762 Osteophyte, left knee: Secondary | ICD-10-CM | POA: Insufficient documentation

## 2019-01-24 DIAGNOSIS — Z7982 Long term (current) use of aspirin: Secondary | ICD-10-CM | POA: Diagnosis not present

## 2019-01-24 HISTORY — PX: TOTAL KNEE ARTHROPLASTY: SHX125

## 2019-01-24 LAB — GLUCOSE, CAPILLARY
Glucose-Capillary: 91 mg/dL (ref 70–99)
Glucose-Capillary: 166 mg/dL — ABNORMAL HIGH (ref 70–99)

## 2019-01-24 SURGERY — ARTHROPLASTY, KNEE, TOTAL
Anesthesia: General | Site: Knee | Laterality: Left

## 2019-01-24 MED ORDER — METHOCARBAMOL 500 MG PO TABS
500.0000 mg | ORAL_TABLET | Freq: Four times a day (QID) | ORAL | Status: DC | PRN
  Administered 2019-01-24 – 2019-01-25 (×2): 500 mg via ORAL
  Filled 2019-01-24 (×2): qty 1

## 2019-01-24 MED ORDER — DEXAMETHASONE SODIUM PHOSPHATE 10 MG/ML IJ SOLN
INTRAMUSCULAR | Status: AC
  Filled 2019-01-24: qty 1

## 2019-01-24 MED ORDER — METHOCARBAMOL 1000 MG/10ML IJ SOLN
500.0000 mg | Freq: Four times a day (QID) | INTRAVENOUS | Status: DC | PRN
  Filled 2019-01-24: qty 5

## 2019-01-24 MED ORDER — POLYETHYLENE GLYCOL 3350 17 G PO PACK: 17.0000 g | PACK | Freq: Every day | ORAL | Status: DC | PRN

## 2019-01-24 MED ORDER — MEPERIDINE HCL 25 MG/ML IJ SOLN
6.2500 mg | INTRAMUSCULAR | Status: DC | PRN
  Administered 2019-01-24: 6.25 mg via INTRAVENOUS

## 2019-01-24 MED ORDER — LACTATED RINGERS IV BOLUS: 500.0000 mL | Freq: Once | INTRAVENOUS | Status: DC

## 2019-01-24 MED ORDER — METOCLOPRAMIDE HCL 5 MG/ML IJ SOLN: 5.0000 mg | Freq: Three times a day (TID) | INTRAMUSCULAR | Status: DC | PRN

## 2019-01-24 MED ORDER — LACTATED RINGERS IV BOLUS: 250.0000 mL | Freq: Once | INTRAVENOUS | Status: DC

## 2019-01-24 MED ORDER — EPINEPHRINE PF 1 MG/ML IJ SOLN
INTRAMUSCULAR | Status: AC
  Filled 2019-01-24: qty 1

## 2019-01-24 MED ORDER — ONDANSETRON HCL 4 MG/2ML IJ SOLN
INTRAMUSCULAR | Status: DC | PRN
  Administered 2019-01-24: 4 mg via INTRAVENOUS

## 2019-01-24 MED ORDER — ONDANSETRON HCL 4 MG PO TABS: 4.0000 mg | ORAL_TABLET | Freq: Four times a day (QID) | ORAL | Status: DC | PRN

## 2019-01-24 MED ORDER — ONDANSETRON HCL 4 MG/2ML IJ SOLN: 4.0000 mg | Freq: Four times a day (QID) | INTRAMUSCULAR | Status: DC | PRN

## 2019-01-24 MED ORDER — VANCOMYCIN HCL 1000 MG IV SOLR
INTRAVENOUS | Status: AC
  Filled 2019-01-24: qty 1000

## 2019-01-24 MED ORDER — TRANEXAMIC ACID-NACL 1000-0.7 MG/100ML-% IV SOLN
INTRAVENOUS | Status: AC
  Filled 2019-01-24: qty 100

## 2019-01-24 MED ORDER — MIDAZOLAM HCL 2 MG/2ML IJ SOLN
INTRAMUSCULAR | Status: AC
  Filled 2019-01-24: qty 2

## 2019-01-24 MED ORDER — FENTANYL CITRATE (PF) 100 MCG/2ML IJ SOLN
INTRAMUSCULAR | Status: AC
  Administered 2019-01-24: 100 ug via INTRAVENOUS
  Filled 2019-01-24: qty 2

## 2019-01-24 MED ORDER — SODIUM CHLORIDE 0.9 % IV SOLN: INTRAVENOUS | Status: DC

## 2019-01-24 MED ORDER — KETOROLAC TROMETHAMINE 10 MG PO TABS: 10.0000 mg | ORAL_TABLET | Freq: Two times a day (BID) | ORAL | 0 refills | Status: DC | PRN

## 2019-01-24 MED ORDER — OXYCODONE HCL ER 10 MG PO T12A: 10.0000 mg | EXTENDED_RELEASE_TABLET | Freq: Two times a day (BID) | ORAL | 0 refills | Status: AC

## 2019-01-24 MED ORDER — OXYCODONE HCL ER 10 MG PO T12A
10.0000 mg | EXTENDED_RELEASE_TABLET | Freq: Two times a day (BID) | ORAL | Status: DC
  Administered 2019-01-24 – 2019-01-25 (×2): 10 mg via ORAL
  Filled 2019-01-24 (×2): qty 1

## 2019-01-24 MED ORDER — CHLORHEXIDINE GLUCONATE 4 % EX LIQD: 60.0000 mL | Freq: Once | CUTANEOUS | Status: DC

## 2019-01-24 MED ORDER — METOCLOPRAMIDE HCL 5 MG PO TABS: 5.0000 mg | ORAL_TABLET | Freq: Three times a day (TID) | ORAL | Status: DC | PRN

## 2019-01-24 MED ORDER — MENTHOL 3 MG MT LOZG: 1.0000 | LOZENGE | OROMUCOSAL | Status: DC | PRN

## 2019-01-24 MED ORDER — FENTANYL CITRATE (PF) 100 MCG/2ML IJ SOLN: 100.0000 ug | Freq: Once | INTRAMUSCULAR | Status: AC

## 2019-01-24 MED ORDER — INSULIN ASPART 100 UNIT/ML ~~LOC~~ SOLN: 0.0000 [IU] | Freq: Three times a day (TID) | SUBCUTANEOUS | Status: DC

## 2019-01-24 MED ORDER — FENTANYL CITRATE (PF) 100 MCG/2ML IJ SOLN
INTRAMUSCULAR | Status: DC | PRN
  Administered 2019-01-24: 50 ug via INTRAVENOUS
  Administered 2019-01-24 (×2): 25 ug via INTRAVENOUS
  Administered 2019-01-24 (×2): 50 ug via INTRAVENOUS

## 2019-01-24 MED ORDER — PANTOPRAZOLE SODIUM 40 MG PO TBEC
40.0000 mg | DELAYED_RELEASE_TABLET | Freq: Every day | ORAL | Status: DC
  Administered 2019-01-24 – 2019-01-25 (×2): 40 mg via ORAL
  Filled 2019-01-24 (×2): qty 1

## 2019-01-24 MED ORDER — DIPHENHYDRAMINE HCL 12.5 MG/5ML PO ELIX: 25.0000 mg | ORAL_SOLUTION | ORAL | Status: DC | PRN

## 2019-01-24 MED ORDER — PROPOFOL 10 MG/ML IV BOLUS
INTRAVENOUS | Status: AC
  Filled 2019-01-24: qty 20

## 2019-01-24 MED ORDER — TRANEXAMIC ACID 1000 MG/10ML IV SOLN
INTRAVENOUS | Status: DC | PRN
  Administered 2019-01-24: 2000 mg via TOPICAL

## 2019-01-24 MED ORDER — OXYCODONE HCL 5 MG PO TABS: 10.0000 mg | ORAL_TABLET | ORAL | Status: DC | PRN

## 2019-01-24 MED ORDER — VANCOMYCIN HCL IN DEXTROSE 1-5 GM/200ML-% IV SOLN: 1000.0000 mg | INTRAVENOUS | Status: AC

## 2019-01-24 MED ORDER — OXYCODONE HCL 5 MG PO TABS
5.0000 mg | ORAL_TABLET | Freq: Once | ORAL | Status: AC | PRN
  Administered 2019-01-24: 5 mg via ORAL

## 2019-01-24 MED ORDER — EPINEPHRINE PF 1 MG/ML IJ SOLN
INTRAMUSCULAR | Status: DC | PRN
  Administered 2019-01-24: .15 mL

## 2019-01-24 MED ORDER — SODIUM CHLORIDE 0.9 % IV SOLN: 12.5000 mg/h | Freq: Once | INTRAVENOUS | Status: DC

## 2019-01-24 MED ORDER — ACETAMINOPHEN 500 MG PO TABS
1000.0000 mg | ORAL_TABLET | Freq: Four times a day (QID) | ORAL | Status: AC
  Administered 2019-01-24 – 2019-01-25 (×4): 1000 mg via ORAL
  Filled 2019-01-24 (×4): qty 2

## 2019-01-24 MED ORDER — POVIDONE-IODINE 10 % EX SWAB: 2.0000 "application " | Freq: Once | CUTANEOUS | Status: DC

## 2019-01-24 MED ORDER — CEFAZOLIN SODIUM-DEXTROSE 2-3 GM-%(50ML) IV SOLR
INTRAVENOUS | Status: DC | PRN
  Administered 2019-01-24: 2 g via INTRAVENOUS

## 2019-01-24 MED ORDER — INSULIN ASPART 100 UNIT/ML ~~LOC~~ SOLN: 0.0000 [IU] | Freq: Every day | SUBCUTANEOUS | Status: DC

## 2019-01-24 MED ORDER — LIDOCAINE 2% (20 MG/ML) 5 ML SYRINGE
INTRAMUSCULAR | Status: AC
  Filled 2019-01-24: qty 5

## 2019-01-24 MED ORDER — TRANEXAMIC ACID-NACL 1000-0.7 MG/100ML-% IV SOLN: 1000.0000 mg | INTRAVENOUS | Status: DC

## 2019-01-24 MED ORDER — KETOROLAC TROMETHAMINE 15 MG/ML IJ SOLN
30.0000 mg | Freq: Four times a day (QID) | INTRAMUSCULAR | Status: AC
  Administered 2019-01-24 – 2019-01-25 (×4): 30 mg via INTRAVENOUS
  Filled 2019-01-24 (×5): qty 2

## 2019-01-24 MED ORDER — PHENOL 1.4 % MT LIQD: 1.0000 | OROMUCOSAL | Status: DC | PRN

## 2019-01-24 MED ORDER — BUPIVACAINE HCL 0.25 % IJ SOLN
INTRAMUSCULAR | Status: DC | PRN
  Administered 2019-01-24: 20 mL

## 2019-01-24 MED ORDER — GABAPENTIN 600 MG PO TABS: 300.0000 mg | ORAL_TABLET | Freq: Three times a day (TID) | ORAL | Status: DC

## 2019-01-24 MED ORDER — VANCOMYCIN HCL 1250 MG/250ML IV SOLN
1250.0000 mg | Freq: Two times a day (BID) | INTRAVENOUS | Status: DC
  Administered 2019-01-25: 1250 mg via INTRAVENOUS
  Filled 2019-01-24 (×2): qty 250

## 2019-01-24 MED ORDER — OXYCODONE HCL 5 MG PO TABS: 5.0000 mg | ORAL_TABLET | Freq: Three times a day (TID) | ORAL | 0 refills | Status: DC | PRN

## 2019-01-24 MED ORDER — SORBITOL 70 % SOLN: 30.0000 mL | Freq: Every day | Status: DC | PRN

## 2019-01-24 MED ORDER — ASPIRIN 81 MG PO CHEW
81.0000 mg | CHEWABLE_TABLET | Freq: Two times a day (BID) | ORAL | Status: DC
  Administered 2019-01-24 – 2019-01-25 (×2): 81 mg via ORAL
  Filled 2019-01-24 (×2): qty 1

## 2019-01-24 MED ORDER — BUPIVACAINE HCL (PF) 0.25 % IJ SOLN
INTRAMUSCULAR | Status: AC
  Filled 2019-01-24: qty 30

## 2019-01-24 MED ORDER — MAGNESIUM CITRATE PO SOLN: 1.0000 | Freq: Once | ORAL | Status: DC | PRN

## 2019-01-24 MED ORDER — CELECOXIB 200 MG PO CAPS
200.0000 mg | ORAL_CAPSULE | Freq: Two times a day (BID) | ORAL | Status: DC
  Administered 2019-01-24 – 2019-01-25 (×2): 200 mg via ORAL
  Filled 2019-01-24 (×2): qty 1

## 2019-01-24 MED ORDER — HYDROMORPHONE HCL 1 MG/ML IJ SOLN
0.5000 mg | INTRAMUSCULAR | Status: DC | PRN
  Administered 2019-01-24 – 2019-01-25 (×2): 1 mg via INTRAVENOUS
  Filled 2019-01-24 (×2): qty 1

## 2019-01-24 MED ORDER — DEXAMETHASONE SODIUM PHOSPHATE 10 MG/ML IJ SOLN
10.0000 mg | Freq: Once | INTRAMUSCULAR | Status: AC
  Administered 2019-01-25: 10 mg via INTRAVENOUS
  Filled 2019-01-24: qty 1

## 2019-01-24 MED ORDER — ASPIRIN EC 81 MG PO TBEC: 81.0000 mg | DELAYED_RELEASE_TABLET | Freq: Two times a day (BID) | ORAL | 0 refills | Status: DC

## 2019-01-24 MED ORDER — ACETAMINOPHEN 325 MG PO TABS: 325.0000 mg | ORAL_TABLET | Freq: Four times a day (QID) | ORAL | Status: DC | PRN

## 2019-01-24 MED ORDER — LIDOCAINE 2% (20 MG/ML) 5 ML SYRINGE
INTRAMUSCULAR | Status: DC | PRN
  Administered 2019-01-24: 40 mg via INTRAVENOUS

## 2019-01-24 MED ORDER — DEXAMETHASONE SODIUM PHOSPHATE 4 MG/ML IJ SOLN
INTRAMUSCULAR | Status: DC | PRN
  Administered 2019-01-24: 4 mg via INTRAVENOUS

## 2019-01-24 MED ORDER — ONDANSETRON HCL 4 MG PO TABS: 4.0000 mg | ORAL_TABLET | Freq: Three times a day (TID) | ORAL | 0 refills | Status: DC | PRN

## 2019-01-24 MED ORDER — FENTANYL CITRATE (PF) 100 MCG/2ML IJ SOLN
INTRAMUSCULAR | Status: AC
  Filled 2019-01-24: qty 2

## 2019-01-24 MED ORDER — SENNOSIDES-DOCUSATE SODIUM 8.6-50 MG PO TABS: 1.0000 | ORAL_TABLET | Freq: Every evening | ORAL | 1 refills | Status: DC | PRN

## 2019-01-24 MED ORDER — 0.9 % SODIUM CHLORIDE (POUR BTL) OPTIME
TOPICAL | Status: DC | PRN
  Administered 2019-01-24: 1000 mL

## 2019-01-24 MED ORDER — PROPOFOL 10 MG/ML IV BOLUS
INTRAVENOUS | Status: DC | PRN
  Administered 2019-01-24: 20 mg via INTRAVENOUS
  Administered 2019-01-24: 200 mg via INTRAVENOUS

## 2019-01-24 MED ORDER — METHOCARBAMOL 750 MG PO TABS: 750.0000 mg | ORAL_TABLET | Freq: Two times a day (BID) | ORAL | 3 refills | Status: DC | PRN

## 2019-01-24 MED ORDER — OXYCODONE HCL 5 MG PO TABS
ORAL_TABLET | ORAL | Status: AC
  Filled 2019-01-24: qty 1

## 2019-01-24 MED ORDER — VANCOMYCIN HCL IN DEXTROSE 1-5 GM/200ML-% IV SOLN
INTRAVENOUS | Status: AC
  Administered 2019-01-24: 1000 mg via INTRAVENOUS
  Filled 2019-01-24: qty 200

## 2019-01-24 MED ORDER — VANCOMYCIN HCL 1000 MG IV SOLR
INTRAVENOUS | Status: DC | PRN
  Administered 2019-01-24: 1000 mg via TOPICAL

## 2019-01-24 MED ORDER — MEPERIDINE HCL 25 MG/ML IJ SOLN
INTRAMUSCULAR | Status: AC
  Filled 2019-01-24: qty 1

## 2019-01-24 MED ORDER — SODIUM CHLORIDE 0.9% FLUSH
INTRAVENOUS | Status: DC | PRN
  Administered 2019-01-24 (×2): 10 mL via INTRADERMAL

## 2019-01-24 MED ORDER — LACTATED RINGERS IV SOLN: INTRAVENOUS | Status: DC

## 2019-01-24 MED ORDER — ROPIVACAINE HCL 5 MG/ML IJ SOLN
INTRAMUSCULAR | Status: DC | PRN
  Administered 2019-01-24: 25 mL via PERINEURAL

## 2019-01-24 MED ORDER — TRANEXAMIC ACID-NACL 1000-0.7 MG/100ML-% IV SOLN
1000.0000 mg | Freq: Once | INTRAVENOUS | Status: AC
  Administered 2019-01-24: 1000 mg via INTRAVENOUS
  Filled 2019-01-24: qty 100

## 2019-01-24 MED ORDER — FENTANYL CITRATE (PF) 250 MCG/5ML IJ SOLN
INTRAMUSCULAR | Status: AC
  Filled 2019-01-24: qty 5

## 2019-01-24 MED ORDER — FENTANYL CITRATE (PF) 100 MCG/2ML IJ SOLN
25.0000 ug | INTRAMUSCULAR | Status: DC | PRN
  Administered 2019-01-24 (×3): 25 ug via INTRAVENOUS

## 2019-01-24 MED ORDER — ALUM & MAG HYDROXIDE-SIMETH 200-200-20 MG/5ML PO SUSP: 30.0000 mL | ORAL | Status: DC | PRN

## 2019-01-24 MED ORDER — DOCUSATE SODIUM 100 MG PO CAPS
100.0000 mg | ORAL_CAPSULE | Freq: Two times a day (BID) | ORAL | Status: DC
  Administered 2019-01-25: 100 mg via ORAL
  Filled 2019-01-24 (×2): qty 1

## 2019-01-24 MED ORDER — BUPIVACAINE LIPOSOME 1.3 % IJ SUSP
INTRAMUSCULAR | Status: DC | PRN
  Administered 2019-01-24: 20 mL

## 2019-01-24 MED ORDER — OXYCODONE HCL 5 MG PO TABS
5.0000 mg | ORAL_TABLET | ORAL | Status: DC | PRN
  Administered 2019-01-25: 10 mg via ORAL
  Filled 2019-01-24: qty 2

## 2019-01-24 MED ORDER — BUPIVACAINE LIPOSOME 1.3 % IJ SUSP
20.0000 mL | Freq: Once | INTRAMUSCULAR | Status: DC
  Filled 2019-01-24: qty 20

## 2019-01-24 MED ORDER — GABAPENTIN 300 MG PO CAPS
300.0000 mg | ORAL_CAPSULE | Freq: Three times a day (TID) | ORAL | Status: DC
  Administered 2019-01-24 – 2019-01-25 (×2): 300 mg via ORAL
  Filled 2019-01-24 (×2): qty 1

## 2019-01-24 MED ORDER — OXYCODONE HCL 5 MG/5ML PO SOLN: 5.0000 mg | Freq: Once | ORAL | Status: AC | PRN

## 2019-01-24 SURGICAL SUPPLY — 76 items
ALCOHOL 70% 16 OZ (MISCELLANEOUS) ×2 IMPLANT
BAG DECANTER FOR FLEXI CONT (MISCELLANEOUS) ×2 IMPLANT
BANDAGE ESMARK 6X9 LF (GAUZE/BANDAGES/DRESSINGS) ×1 IMPLANT
BENZOIN TINCTURE PRP APPL 2/3 (GAUZE/BANDAGES/DRESSINGS) ×2 IMPLANT
BLADE SAG 18X100X1.27 (BLADE) ×2 IMPLANT
BNDG ELASTIC 6X10 VLCR STRL LF (GAUZE/BANDAGES/DRESSINGS) IMPLANT
BNDG ESMARK 6X9 LF (GAUZE/BANDAGES/DRESSINGS) ×2
BOWL SMART MIX CTS (DISPOSABLE) IMPLANT
CLSR STERI-STRIP ANTIMIC 1/2X4 (GAUZE/BANDAGES/DRESSINGS) ×2 IMPLANT
COMP FEM SZ5 CRUC LEFT RETAIN (Orthopedic Implant) ×2 IMPLANT
COMPONENT FEM SZ5 CRU LT RETN (Orthopedic Implant) ×1 IMPLANT
COVER SURGICAL LIGHT HANDLE (MISCELLANEOUS) ×2 IMPLANT
COVER WAND RF STERILE (DRAPES) IMPLANT
CUFF TOURN SGL QUICK 34 (TOURNIQUET CUFF) ×1
CUFF TOURN SGL QUICK 42 (TOURNIQUET CUFF) IMPLANT
CUFF TRNQT CYL 34X4.125X (TOURNIQUET CUFF) ×1 IMPLANT
DERMABOND ADVANCED (GAUZE/BANDAGES/DRESSINGS) ×1
DERMABOND ADVANCED .7 DNX12 (GAUZE/BANDAGES/DRESSINGS) ×1 IMPLANT
DRAPE EXTREMITY T 121X128X90 (DISPOSABLE) ×2 IMPLANT
DRAPE HALF SHEET 40X57 (DRAPES) ×6 IMPLANT
DRAPE INCISE IOBAN 66X45 STRL (DRAPES) IMPLANT
DRAPE ORTHO SPLIT 77X108 STRL (DRAPES) ×2
DRAPE POUCH INSTRU U-SHP 10X18 (DRAPES) ×2 IMPLANT
DRAPE SURG ORHT 6 SPLT 77X108 (DRAPES) ×2 IMPLANT
DRAPE U-SHAPE 47X51 STRL (DRAPES) ×4 IMPLANT
DRSG AQUACEL AG ADV 3.5X10 (GAUZE/BANDAGES/DRESSINGS) ×2 IMPLANT
DRSG PAD ABDOMINAL 8X10 ST (GAUZE/BANDAGES/DRESSINGS) IMPLANT
DURAPREP 26ML APPLICATOR (WOUND CARE) ×6 IMPLANT
ELECT CAUTERY BLADE 6.4 (BLADE) ×2 IMPLANT
ELECT REM PT RETURN 9FT ADLT (ELECTROSURGICAL) ×2
ELECTRODE REM PT RTRN 9FT ADLT (ELECTROSURGICAL) ×1 IMPLANT
GAUZE SPONGE 4X4 12PLY STRL (GAUZE/BANDAGES/DRESSINGS) ×2 IMPLANT
GLOVE BIOGEL PI IND STRL 7.0 (GLOVE) ×1 IMPLANT
GLOVE BIOGEL PI INDICATOR 7.0 (GLOVE) ×1
GLOVE ECLIPSE 7.0 STRL STRAW (GLOVE) ×6 IMPLANT
GLOVE SKINSENSE NS SZ7.5 (GLOVE) ×3
GLOVE SKINSENSE STRL SZ7.5 (GLOVE) ×3 IMPLANT
GLOVE SURG SYN 7.5  E (GLOVE) ×4
GLOVE SURG SYN 7.5 E (GLOVE) ×4 IMPLANT
GOWN STRL REIN XL XLG (GOWN DISPOSABLE) ×2 IMPLANT
GOWN STRL REUS W/ TWL LRG LVL3 (GOWN DISPOSABLE) ×1 IMPLANT
GOWN STRL REUS W/TWL LRG LVL3 (GOWN DISPOSABLE) ×1
HANDPIECE INTERPULSE COAX TIP (DISPOSABLE) ×1
HOOD PEEL AWAY FLYTE STAYCOOL (MISCELLANEOUS) ×4 IMPLANT
INSERT TRIATH CS X3 11 (Insert) ×2 IMPLANT
KIT BASIN OR (CUSTOM PROCEDURE TRAY) ×2 IMPLANT
KIT TURNOVER KIT B (KITS) ×2 IMPLANT
KNEE PATELLA ASYMMETRIC 10X32 (Knees) ×2 IMPLANT
KNEE TIBIAL COMPONENT SZ5 (Knees) ×2 IMPLANT
MANIFOLD NEPTUNE II (INSTRUMENTS) ×2 IMPLANT
MARKER SKIN DUAL TIP RULER LAB (MISCELLANEOUS) ×2 IMPLANT
NEEDLE SPNL 18GX3.5 QUINCKE PK (NEEDLE) ×4 IMPLANT
NS IRRIG 1000ML POUR BTL (IV SOLUTION) ×2 IMPLANT
PACK TOTAL JOINT (CUSTOM PROCEDURE TRAY) ×2 IMPLANT
PAD ARMBOARD 7.5X6 YLW CONV (MISCELLANEOUS) ×4 IMPLANT
PAD CAST 4YDX4 CTTN HI CHSV (CAST SUPPLIES) IMPLANT
PADDING CAST COTTON 4X4 STRL (CAST SUPPLIES)
PIN FLUTED HEDLESS FIX 3.5X1/8 (PIN) ×2 IMPLANT
SAW OSC TIP CART 19.5X105X1.3 (SAW) ×2 IMPLANT
SET HNDPC FAN SPRY TIP SCT (DISPOSABLE) ×1 IMPLANT
STAPLER VISISTAT 35W (STAPLE) IMPLANT
SUCTION FRAZIER HANDLE 10FR (MISCELLANEOUS) ×1
SUCTION TUBE FRAZIER 10FR DISP (MISCELLANEOUS) ×1 IMPLANT
SUT ETHILON 2 0 FS 18 (SUTURE) IMPLANT
SUT MNCRL AB 4-0 PS2 18 (SUTURE) IMPLANT
SUT VIC AB 0 CT1 27 (SUTURE) ×2
SUT VIC AB 0 CT1 27XBRD ANBCTR (SUTURE) ×2 IMPLANT
SUT VIC AB 1 CTX 27 (SUTURE) ×6 IMPLANT
SUT VIC AB 2-0 CT1 27 (SUTURE) ×4
SUT VIC AB 2-0 CT1 TAPERPNT 27 (SUTURE) ×4 IMPLANT
SYRINGE REG LEUR 60CC (SYRINGE) ×4 IMPLANT
TOWEL GREEN STERILE (TOWEL DISPOSABLE) ×2 IMPLANT
TOWEL GREEN STERILE FF (TOWEL DISPOSABLE) ×2 IMPLANT
TRAY CATH 16FR W/PLASTIC CATH (SET/KITS/TRAYS/PACK) IMPLANT
UNDERPAD 30X30 (UNDERPADS AND DIAPERS) ×2 IMPLANT
WRAP KNEE MAXI GEL POST OP (GAUZE/BANDAGES/DRESSINGS) ×2 IMPLANT

## 2019-01-24 NOTE — Anesthesia Procedure Notes (Signed)
Anesthesia Regional Block: Adductor canal block   Pre-Anesthetic Checklist: ,, timeout performed, Correct Patient, Correct Site, Correct Laterality, Correct Procedure, Correct Position, site marked, Risks and benefits discussed,  Surgical consent,  Pre-op evaluation,  At surgeon's request and post-op pain management  Laterality: Left  Prep: chloraprep       Needles:  Injection technique: Single-shot  Needle Type: Echogenic Needle     Needle Length: 9cm  Needle Gauge: 21     Additional Needles:   Narrative:  Start time: 01/24/2019 12:53 PM End time: 01/24/2019 12:58 PM Injection made incrementally with aspirations every 5 mL.  Performed by: Personally  Anesthesiologist: Achille Rich, MD  Additional Notes: Pt tolerated the procedure well.

## 2019-01-24 NOTE — H&P (Signed)
PREOPERATIVE H&P  Chief Complaint: left knee degenerative joint disease  HPI: Brett [name] is a 59 y.o. male who presents for surgical treatment of left knee degenerative joint disease.  He denies any changes in medical history.  Past Medical History:  Diagnosis Date  . Allergy    albuterol as needed  . Anal fissure   . Arthritis   . Colon polyps 01/13/11   Colonoscopy  . GERD (gastroesophageal reflux disease)   . Pneumonia   . Pre-diabetes    Past Surgical History:  Procedure Laterality Date  . ANAL FISSURE REPAIR  02/24/11  . CHONDROPLASTY  08/14/2018   Procedure: CHONDROPLASTY;  Surgeon: Leandrew Koyanagi, MD;  Location: [place];  Service: Orthopedics;;  . COLONOSCOPY    . KNEE ARTHROSCOPY WITH MEDIAL MENISECTOMY Left 08/14/2018   Procedure: LEFT KNEE ARTHROSCOPY WITH PARTIAL MEDIAL MENISCECTOMY;  Surgeon: Leandrew Koyanagi, MD;  Location: [place];  Service: Orthopedics;  Laterality: Left;  . KNEE ARTHROSCOPY WITH MEDIAL MENISECTOMY Right 10/18/2018   Procedure: RIGHT KNEE ARTHROSCOPY WITH PARTIAL MEDIAL MENISCECTOMY;  Surgeon: Leandrew Koyanagi, MD;  Location: [place];  Service: Orthopedics;  Laterality: Right;   Social History   Socioeconomic History  . Marital status: Married    Spouse name: Not on file  . Number of children: 2  . Years of education: Not on file  . Highest education level: Not on file  Occupational History    Employer: [employer]  Tobacco Use  . Smoking status: Former Smoker    Packs/day: 0.00    Years: 27.00    Pack years: 0.00    Types: Cigarettes    Quit date: 08/20/2018    Years since quitting: 0.4  . Smokeless tobacco: Never Used  Substance and Sexual Activity  . Alcohol use: Yes    Comment: socially  . Drug use: No  . Sexual activity: Not on file  Other Topics Concern  . Not on file  Social History Narrative  . Not on file   Social Determinants of Health   Financial Resource Strain:     . Difficulty of Paying Living Expenses: Not on file  Food Insecurity:   . Worried About Charity fundraiser in the Last Year: Not on file  . Ran Out of Food in the Last Year: Not on file  Transportation Needs:   . Lack of Transportation (Medical): Not on file  . Lack of Transportation (Non-Medical): Not on file  Physical Activity:   . Days of Exercise per Week: Not on file  . Minutes of Exercise per Session: Not on file  Stress:   . Feeling of Stress : Not on file  Social Connections:   . Frequency of Communication with Friends and Family: Not on file  . Frequency of Social Gatherings with Friends and Family: Not on file  . Attends Religious Services: Not on file  . Active Member of Clubs or Organizations: Not on file  . Attends Archivist Meetings: Not on file  . Marital Status: Not on file   Family History  Problem Relation Age of Onset  . Rheum arthritis Mother   . Allergies Daughter   . Allergies Sister   . Asthma Daughter    Allergies  Allergen Reactions  . Benzodiazepines Hives  . Chantix  [Varenicline] Hives  . Cialis [Tadalafil]     Hives  . Dicyclomine Hives  . Meloxicam     Rash  .  Penicillins Hives    DID THE REACTION INVOLVE: Swelling of the face/tongue/throat, SOB, or low BP? Unknown Sudden or severe rash/hives, skin peeling, or the inside of the mouth or nose? Unknown Did it require medical treatment? n  When did it last happen?Over 5 years ago. If all above answers are "NO", may proceed with cephalosporin use.   . Relafen [Nabumetone]     Hives   Prior to Admission medications   Medication Sig Start Date End Date Taking? Authorizing Provider  Cholecalciferol (VITAMIN D-3) 125 MCG (5000 UT) TABS Take 5,000 Units by mouth daily.   Yes [provider]  Diclofenac Sodium (PENNSAID) 2 % SOLN Apply 2 g topically 2 (two) times daily as needed (to affected area). Patient taking differently: Apply 2 g topically 2 (two) times daily as  needed (knee pain.).  11/27/18  Yes Leandrew Koyanagi, MD  docusate sodium (COLACE) 100 MG capsule Take 100 mg by mouth 2 (two) times daily as needed (constipation).   Yes [provider]  gabapentin (NEURONTIN) 600 MG tablet Take 1/2 to 1 tablet 2 to 3 x /Daily as needed for Pain 01/18/19  Yes Unk Pinto, MD  ibuprofen (ADVIL) 800 MG tablet Take 1 tablet 3 x /day with Meals for pain Patient taking differently: Take 800 mg by mouth 3 (three) times daily as needed (pain.).  12/29/18  Yes Unk Pinto, MD  ketorolac (TORADOL) 10 MG tablet Take 1 tablet (10 mg total) by mouth 2 (two) times daily as needed. 01/24/19  Yes Leandrew Koyanagi, MD  methocarbamol (ROBAXIN) 500 MG tablet Take 1 tablet (500 mg total) by mouth 2 (two) times daily as needed. Patient taking differently: Take 500 mg by mouth 2 (two) times daily as needed for muscle spasms.  01/07/19  Yes Aundra Dubin, PA-C  omeprazole (PRILOSEC) 40 MG capsule Take 1 capsule (40 mg total) by mouth daily. 08/21/18 08/21/19 Yes [name], PA-C  predniSONE (STERAPRED UNI-PAK 21 TAB) 10 MG (21) TBPK tablet Take as directed 01/14/19  Yes Leandrew Koyanagi, MD  traMADol (ULTRAM) 50 MG tablet Take 1-2 po bid prn pain Patient taking differently: Take 100 mg by mouth 3 (three) times daily.  01/07/19  Yes Aundra Dubin, PA-C  aspirin EC 81 MG tablet Take 1 tablet (81 mg total) by mouth 2 (two) times daily. 01/24/19   Leandrew Koyanagi, MD  blood glucose meter kit and supplies Test blood sugar twice daily or as directed by [facility] provider. 08/28/18   [name], PA-C  blood glucose meter kit and supplies Dispense based insurance preference. E11.22 08/29/18   [name], PA-C  glucose blood test strip Use as instructed 03/23/14   Rolene Course, PA-C  ketorolac (TORADOL) 10 MG tablet Take 1 tablet (10 mg total) by mouth 2 (two) times daily as needed. Patient not taking: Reported on 01/14/2019 10/18/18   Leandrew Koyanagi, MD  methocarbamol (ROBAXIN) 750  MG tablet Take 1 tablet (750 mg total) by mouth 2 (two) times daily as needed for muscle spasms. 01/24/19   Leandrew Koyanagi, MD  ondansetron (ZOFRAN) 4 MG tablet Take 1-2 tablets (4-8 mg total) by mouth every 8 (eight) hours as needed for nausea or vomiting. 01/24/19   Leandrew Koyanagi, MD  oxyCODONE (OXY IR/ROXICODONE) 5 MG immediate release tablet Take 1-2 tablets (5-10 mg total) by mouth every 8 (eight) hours as needed for severe pain. 01/24/19   Leandrew Koyanagi, MD  oxyCODONE (OXYCONTIN) 10 mg  12 hr tablet Take 1 tablet (10 mg total) by mouth every 12 (twelve) hours for 3 days. 01/24/19 01/27/19  Leandrew Koyanagi, MD  senna-docusate (SENOKOT S) 8.6-50 MG tablet Take 1-2 tablets by mouth at bedtime as needed. 01/24/19   Leandrew Koyanagi, MD     Positive ROS: All other systems have been reviewed and were otherwise negative with the exception of those mentioned in the HPI and as above.  Physical Exam: General: Alert, no acute distress Cardiovascular: No pedal edema Respiratory: No cyanosis, no use of accessory musculature GI: abdomen soft Skin: No lesions in the area of chief complaint Neurologic: Sensation intact distally Psychiatric: Patient is competent for consent with normal mood and affect Lymphatic: no lymphedema  MUSCULOSKELETAL: exam stable  Assessment: left knee degenerative joint disease  Plan: Plan for Procedure(s): LEFT TOTAL KNEE ARTHROPLASTY  The risks benefits and alternatives were discussed with the patient including but not limited to the risks of nonoperative treatment, versus surgical intervention including infection, bleeding, nerve injury,  blood clots, cardiopulmonary complications, morbidity, mortality, among others, and they were willing to proceed.   Preoperative templating of the joint replacement has been completed, documented, and submitted to the Operating Room personnel in order to optimize intra-operative equipment management.   Patient's anticipated LOS is less than 2  midnights, meeting these requirements: - Younger than 80 - Lives within 1 hour of care - Has a competent adult at home to recover with post-op recover - NO history of  - Chronic pain requiring opiods  - Diabetes  - Coronary Artery Disease  - Heart failure  - Heart attack  - Stroke  - DVT/VTE  - Cardiac arrhythmia  - Respiratory Failure/COPD  - Renal failure  - Anemia  - Advanced Liver disease  Eduard Roux, MD   01/24/2019 1:20 PM

## 2019-01-24 NOTE — Progress Notes (Signed)
PT Cancellation Note  Patient Details Name: Brett [name] MRN: [id number] DOB: Jan 07, 1960   Cancelled Treatment:    Reason Eval/Treat Not Completed: Pain limiting ability to participate Pt reporting he is in too much pain to participate and would like to hold PT until tomorrow. Will follow up as schedule allows.   Cindee Salt, DPT  Acute Rehabilitation Services  Pager: [phone] Office: [phone]  [name] 01/24/2019, 6:23 PM

## 2019-01-24 NOTE — Transfer of Care (Signed)
Immediate Anesthesia Transfer of Care Note  Patient: Brett [name]  Procedure(s) Performed: LEFT TOTAL KNEE ARTHROPLASTY (Left Knee)  Patient Location: PACU  Anesthesia Type:GA combined with regional for post-op pain  Level of Consciousness: awake and alert   Airway & Oxygen Therapy: Patient Spontanous Breathing and Patient connected to nasal cannula oxygen  Post-op Assessment: Report given to RN, Post -op Vital signs reviewed and stable and Patient moving all extremities X 4  Post vital signs: Reviewed and stable  Last Vitals:  Vitals Value Taken Time  BP 168/111 01/24/19 1622  Temp    Pulse 88 01/24/19 1622  Resp 14 01/24/19 1622  SpO2 95 % 01/24/19 1622  Vitals shown include unvalidated device data.  Last Pain:  Vitals:   01/24/19 1152  TempSrc:   PainSc: 7       Patients Stated Pain Goal: 1 (01/24/19 1152)  Complications: No apparent anesthesia complications

## 2019-01-24 NOTE — Op Note (Signed)
Total Knee Arthroplasty Procedure Note  Preoperative diagnosis: Left knee osteoarthritis  Postoperative diagnosis:same  Operative procedure: Left total knee arthroplasty. CPT [phone]  Surgeon: N. Glee Arvin, MD  Assist: Oneal Grout, PA-C; necessary for the timely completion of procedure and due to complexity of procedure.  Anesthesia: general, regional  Tourniquet time: 57 mins  Implants used: [employer] Triatholon uncemented Femur: CR 5 Tibia: 5 Patella: 32 mm Polyethylene: 11 mm, UC  Indication: Minoru Chap is a 59 y.o. year old male with a history of knee pain. Having failed conservative management, the patient elected to proceed with a total knee arthroplasty.  We have reviewed the risk and benefits of the surgery and they elected to proceed after voicing understanding.  Procedure:  After informed consent was obtained and understanding of the risk were voiced including but not limited to bleeding, infection, damage to surrounding structures including nerves and vessels, blood clots, leg length inequality and the failure to achieve desired results, the operative extremity was marked with verbal confirmation of the patient in the holding area.   The patient was then brought to the operating room and transported to the operating room table in the supine position.  A tourniquet was applied to the operative extremity around the upper thigh. The operative limb was then prepped and draped in the usual sterile fashion and preoperative antibiotics were administered.  A time out was performed prior to the start of surgery confirming the correct extremity, preoperative antibiotic administration, as well as team members, implants and instruments available for the case. Correct surgical site was also confirmed with preoperative radiographs. The limb was then elevated for exsanguination and the tourniquet was inflated. A midline incision was made and a standard medial parapatellar  approach was performed.  The patella was prepared and sized to a 32 mm.  A cover was placed on the patella for protection from retractors.  We then turned our attention to the femur. Posterior cruciate ligament was retained. Start site was drilled in the femur and the intramedullary distal femoral cutting guide was placed, set at 5 degrees valgus, taking 9 mm of distal resection. The distal cut was made. Osteophytes were then removed.  Extension gap was then checked. Next, the proximal tibial cutting guide was placed with appropriate slope, varus/valgus alignment and depth of resection. The proximal tibial cut was made. Gap blocks were then used to assess the extension gap and alignment, and appropriate soft tissue releases were performed. Attention was turned back to the femur, which was sized using the sizing guide to a size 5. Appropriate rotation of the femoral component was determined using epicondylar axis, Whiteside's line, and assessing the flexion gap under ligament tension. The appropriate size 4-in-1 cutting block was placed and cuts were made.  The box was then cut for the PS component.  Posterior femoral osteophytes and uncapped bone were then removed with the curved osteotome.  Trial components were placed, and stability was checked in full extension, mid-flexion, and deep flexion. Proper tibial rotation was determined and marked.  The patella tracked well without a lateral release. Trial components were then removed and tibial preparation performed.  The tibia was sized for a size 5 component.  A posterior capsular injection comprising of 20 cc of 1.3% exparel, 20 cc of 0.25% bupivicaine with epi and 20 cc of normal saline was performed for postoperative pain control. The bony surfaces were irrigated with a pulse lavage and then dried. Bone cement was vacuum mixed on the back  table, and the final components sized above were cemented into place. After cement had finished curing, excess cement was  removed. The stability of the construct was re-evaluated throughout a range of motion and found to be acceptable. The trial liner was removed, the knee was copiously irrigated, and the knee was re-evaluated for any excess bone debris. The real polyethylene liner, 11 mm thick, was inserted and checked to ensure the locking mechanism had engaged appropriately. The tourniquet was deflated and hemostasis was achieved. The wound was irrigated with normal saline.  One gram of vancomycin powder was placed in the surgical bed.  Capsular closure was performed with a #1 vicryl, subcutaneous fat closed with a 2.0 vicryl suture, then subcutaneous tissue closed with interrupted 2.0 vicryl suture. The skin was then closed with a 4.0 monocryl. A sterile dressing was applied.  The patient was awakened in the operating room and taken to recovery in stable condition. All sponge, needle, and instrument counts were correct at the end of the case.  Position: supine  Complications: none.  Time Out: performed   Drains/Packing: none  Estimated blood loss: minimal  Returned to Recovery Room: in good condition.   Antibiotics: yes   Mechanical VTE (DVT) Prophylaxis: sequential compression devices, TED thigh-high  Chemical VTE (DVT) Prophylaxis: aspirin  Fluid Replacement  Crystalloid: see anesthesia record Blood: none  FFP: none   Specimens Removed: 1 to pathology   Sponge and Instrument Count Correct? yes   PACU: portable radiograph - knee AP and Lateral   Plan/RTC: Return in 2 weeks for wound check.   Weight Bearing/Load Lower Extremity: full   N. Eduard Roux, MD [facility] 3:39 PM

## 2019-01-24 NOTE — Anesthesia Procedure Notes (Signed)
Procedure Name: LMA Insertion Date/Time: 01/24/2019 2:16 PM Performed by: Fransisca Kaufmann, CRNA Pre-anesthesia Checklist: Patient identified, Emergency Drugs available, Suction available and Patient being monitored Patient Re-evaluated:Patient Re-evaluated prior to induction Oxygen Delivery Method: Circle System Utilized Preoxygenation: Pre-oxygenation with 100% oxygen Induction Type: IV induction Ventilation: Mask ventilation without difficulty LMA: LMA inserted LMA Size: 5.0 Grade View: Grade I Number of attempts: 1 Airway Equipment and Method: Bite block Placement Confirmation: positive ETCO2 Tube secured with: Tape Dental Injury: Teeth and Oropharynx as per pre-operative assessment

## 2019-01-24 NOTE — Anesthesia Preprocedure Evaluation (Addendum)
Anesthesia Evaluation  Patient identified by MRN, date of birth, ID band Patient awake    Reviewed: Allergy & Precautions, H&P , NPO status , Patient's Chart, lab work & pertinent test results  Airway Mallampati: II   Neck ROM: full    Dental   Pulmonary former smoker,    breath sounds clear to auscultation       Cardiovascular negative cardio ROS   Rhythm:regular Rate:Normal     Neuro/Psych    GI/Hepatic GERD  ,  Endo/Other    Renal/GU      Musculoskeletal  (+) Arthritis ,   Abdominal   Peds  Hematology   Anesthesia Other Findings   Reproductive/Obstetrics                             Anesthesia Physical Anesthesia Plan  ASA: II  Anesthesia Plan: General   Post-op Pain Management:  Regional for Post-op pain   Induction: Intravenous  PONV Risk Score and Plan: 2 and Ondansetron, Treatment may vary due to age or medical condition and Dexamethasone  Airway Management Planned: LMA  Additional Equipment:   Intra-op Plan:   Post-operative Plan:   Informed Consent: I have reviewed the patients History and Physical, chart, labs and discussed the procedure including the risks, benefits and alternatives for the proposed anesthesia with the patient or authorized representative who has indicated his/her understanding and acceptance.       Plan Discussed with: CRNA, Anesthesiologist and Surgeon  Anesthesia Plan Comments:        Anesthesia Quick Evaluation

## 2019-01-24 NOTE — Progress Notes (Addendum)
1755 received pt from PACU, A&O x4. Lft knee dressing dry and intact, bil Teds on. Ice pack to left knee. Pt c/o 9/10 pain. Refused to start on CPM. 1820 PT came in, pt refused PT.  1855 Left a message to Dr Roda Shutters regarding pt not able to participate with PT tonight.

## 2019-01-24 NOTE — Progress Notes (Signed)
Orthopedic Tech Progress Note Patient Details:  Brett [name] 1960/04/05 [id number]  CPM Left Knee CPM Left Knee: Other (Comment)(PATIENT REFUSED DUE TO PAIN)  Post Interventions Patient Tolerated: Refused intervention Instructions Provided: Other (comment)  [name] 01/24/2019, 6:24 PM

## 2019-01-24 NOTE — Anesthesia Postprocedure Evaluation (Signed)
Anesthesia Post Note  Patient: Acupuncturist) Performed: LEFT TOTAL KNEE ARTHROPLASTY (Left Knee)     Patient location during evaluation: PACU Anesthesia Type: General Level of consciousness: awake and alert, patient cooperative and oriented Pain management: pain level controlled (pain improving) Vital Signs Assessment: post-procedure vital signs reviewed and stable Respiratory status: spontaneous breathing, nonlabored ventilation and respiratory function stable Cardiovascular status: blood pressure returned to baseline and stable Postop Assessment: no apparent nausea or vomiting Anesthetic complications: no    Last Vitals:  Vitals:   01/24/19 1647 01/24/19 1703  BP: (!) 135/117 (!) 173/91  Pulse: 86 92  Resp: (!) 21 (!) 32  Temp:    SpO2: 96% 96%    Last Pain:  Vitals:   01/24/19 1647  TempSrc:   PainSc: 10-Worst pain ever                 Jolinda Pinkstaff,E. Aleeta Schmaltz

## 2019-01-24 NOTE — Care Plan (Signed)
RNCM spoke with patient prior to his upcoming Left TKA with Dr. Roda Shutters on 01/24/19. It was anticipated at that time that patient would be a same day discharge, but since then, the patient anticipates overnight stay. A home CPM, FWW, and 3in1 were to be delivered to his home prior to surgery per [employer]. Anticipate HHPT will be needed after short hospital stay. Choice provided and referral made to [facility]. OPPT scheduled with Aldean Baker on 02/13/19 at 11:45 am. F/U with Dr. Roda Shutters also scheduled for 02/11/19.

## 2019-01-25 ENCOUNTER — Other Ambulatory Visit (HOSPITAL_COMMUNITY): Payer: Self-pay | Admitting: Specialist

## 2019-01-25 DIAGNOSIS — M1712 Unilateral primary osteoarthritis, left knee: Secondary | ICD-10-CM | POA: Diagnosis not present

## 2019-01-25 LAB — CBC
HCT: 38.9 % — ABNORMAL LOW (ref 39.0–52.0)
Hemoglobin: 12.4 g/dL — ABNORMAL LOW (ref 13.0–17.0)
MCH: 26.6 pg (ref 26.0–34.0)
MCHC: 31.9 g/dL (ref 30.0–36.0)
MCV: 83.5 fL (ref 80.0–100.0)
Platelets: 221 10*3/uL (ref 150–400)
RBC: 4.66 MIL/uL (ref 4.22–5.81)
RDW: 13.3 % (ref 11.5–15.5)
WBC: 10.1 10*3/uL (ref 4.0–10.5)
nRBC: 0 % (ref 0.0–0.2)

## 2019-01-25 LAB — GLUCOSE, CAPILLARY
Glucose-Capillary: 103 mg/dL — ABNORMAL HIGH (ref 70–99)
Glucose-Capillary: 121 mg/dL — ABNORMAL HIGH (ref 70–99)

## 2019-01-25 LAB — BASIC METABOLIC PANEL
Anion gap: 10 (ref 5–15)
BUN: 14 mg/dL (ref 6–20)
CO2: 24 mmol/L (ref 22–32)
Calcium: 8.9 mg/dL (ref 8.9–10.3)
Chloride: 103 mmol/L (ref 98–111)
Creatinine, Ser: 1.23 mg/dL (ref 0.61–1.24)
GFR calc Af Amer: >60 mL/min (ref >60)
GFR calc non Af Amer: >60 mL/min (ref >60)
Glucose, Bld: 112 mg/dL — ABNORMAL HIGH (ref 70–99)
Potassium: 4.6 mmol/L (ref 3.5–5.1)
Sodium: 137 mmol/L (ref 135–145)

## 2019-01-25 NOTE — Discharge Instructions (Signed)

## 2019-01-25 NOTE — Progress Notes (Signed)
     Subjective: 1 Day Post-Op Procedure(s) (LRB): LEFT TOTAL KNEE ARTHROPLASTY (Left) Sitting up at bedside left knee with dressing 90 degrees flexion comfortably. For discharge today. PT present and plans to work with stairs prior to his discharge.   Patient reports pain as mild.    Objective:   VITALS:  Temp:  [97.5 F (36.4 C)-98.9 F (37.2 C)] 98 F (36.7 C) (01/23 0827) Pulse Rate:  [48-130] 97 (01/23 0827) Resp:  [11-32] 20 (01/23 0827) BP: (117-178)/(74-125) 124/74 (01/23 0827) SpO2:  [94 %-100 %] 97 % (01/23 0402) Weight:  [75.8 kg] 75.8 kg (01/22 1144)  Neurologically intact ABD soft Neurovascular intact Sensation intact distally Intact pulses distally Dorsiflexion/Plantar flexion intact Incision: no drainage and scant drainage No cellulitis present Compartment soft   LABS Recent Labs    01/25/19 0220  HGB 12.4*  WBC 10.1  PLT 221   Recent Labs    01/25/19 0220  NA 137  K 4.6  CL 103  CO2 24  BUN 14  CREATININE 1.23  GLUCOSE 112*   No results for input(s): LABPT, INR in the last 72 hours.   Assessment/Plan: 1 Day Post-Op Procedure(s) (LRB): LEFT TOTAL KNEE ARTHROPLASTY (Left)  Advance diet Up with therapy D/C IV fluids Discharge home with home health  Vira Browns 01/25/2019, 10:51 AM

## 2019-01-25 NOTE — Plan of Care (Signed)

## 2019-01-25 NOTE — Progress Notes (Signed)
Refusing to use CPM, states pain.  Frequent prn pain meds given with decreasing pain score.  Encourage to sit on side of bed, states will consider later.  Continue to monitor pain and encourage mobility.

## 2019-01-25 NOTE — Evaluation (Signed)
Physical Therapy Evaluation Patient Details Name: Brett [name] MRN: [id number] DOB: 02-May-1960 Today's Date: 01/25/2019   History of Present Illness  Patient is a 59 y/o male s/p L TKA on 01/23/18. PMH of GERD, pre-diabetic.   Clinical Impression  Patient s/p L TKA on 01/23/18. Patient today demonstrating reduced ROM, strength, balance and functional mobility, as expected post-op. Patient today able to ambulate in hallway and up/down 2 steps with min guard assist. Patient will require BSC at discharge to elevate toilet seat. PT to continue to follow patient while admitted to progress safe and independent functional mobility.     Follow Up Recommendations Follow surgeon's recommendation for DC plan and follow-up therapies    Equipment Recommendations  3in1 (PT)    Recommendations for Other Services       Precautions / Restrictions Precautions Precautions: Fall;Knee Precaution Comments: reviewed precautions with no pillow under knee Restrictions Weight Bearing Restrictions: No Other Position/Activity Restrictions: WBAT      Mobility  Bed Mobility Overal bed mobility: Needs Assistance Bed Mobility: Supine to Sit     Supine to sit: Min guard        Transfers Overall transfer level: Needs assistance Equipment used: Rolling walker (2 wheeled) Transfers: Sit to/from [employer] Sit to Stand: Min guard Stand pivot transfers: Min guard       General transfer comment: for safety and immediate standing balance  Ambulation/Gait Ambulation/Gait assistance: Min guard Gait Distance (Feet): 200 Feet Assistive device: Rolling walker (2 wheeled) Gait Pattern/deviations: Step-to pattern;Step-through pattern;Decreased weight shift to left;Decreased step length - right;Decreased stance time - left;Antalgic Gait velocity: decreased   General Gait Details: very slow gait pattern  Stairs Stairs: Yes Stairs assistance: Min guard Stair Management: Two rails;Step  to pattern;Forwards Number of Stairs: 2    Wheelchair Mobility    Modified Rankin (Stroke Patients Only)       Balance Overall balance assessment: Mild deficits observed, not formally tested                                           Pertinent Vitals/Pain Pain Assessment: 0-10 Pain Score: 7  Pain Location: L knee Pain Intervention(s): Limited activity within patient's tolerance;Monitored during session;Repositioned;Premedicated before session    Home Living Family/patient expects to be discharged to:: Private residence Living Arrangements: Spouse/significant other Available Help at Discharge: Family;Available 24 hours/day Type of Home: House Home Access: Stairs to enter   [employer] of Steps: 1 Home Layout: One level Home Equipment: Walker - 2 wheels;Walker - 4 wheels;Cane - single point;Shower seat      Prior Function Level of Independence: Independent               Hand Dominance        Extremity/Trunk Assessment   Upper Extremity Assessment Upper Extremity Assessment: Overall WFL for tasks assessed    Lower Extremity Assessment Lower Extremity Assessment: LLE deficits/detail LLE Deficits / Details: expected post-op weakness and pain    Cervical / Trunk Assessment Cervical / Trunk Assessment: Normal  Communication   Communication: No difficulties  Cognition Arousal/Alertness: Awake/alert Behavior During Therapy: WFL for tasks assessed/performed Overall Cognitive Status: Within Functional Limits for tasks assessed  General Comments      Exercises Total Joint Exercises Ankle Circles/Pumps: AROM;Both;20 reps Quad Sets: AROM;Left;10 reps   Assessment/Plan    PT Assessment Patient needs continued PT services  PT Problem List Decreased strength;Decreased range of motion;Decreased activity tolerance;Decreased balance;Decreased mobility;Decreased  coordination;Decreased knowledge of use of DME;Decreased safety awareness;Decreased knowledge of precautions       PT Treatment Interventions DME instruction;Gait training;Stair training;Functional mobility training;Therapeutic activities;Therapeutic exercise;Balance training;Neuromuscular re-education;Patient/family education    PT Goals (Current goals can be found in the Care Plan section)  Acute Rehab PT Goals Patient Stated Goal: improve pain PT Goal Formulation: With patient Time For Goal Achievement: 02/01/19 Potential to Achieve Goals: Good    Frequency 7X/week   Barriers to discharge        Co-evaluation               AM-PAC PT "6 Clicks" Mobility  Outcome Measure Help needed turning from your back to your side while in a flat bed without using bedrails?: A Little Help needed moving from lying on your back to sitting on the side of a flat bed without using bedrails?: A Little Help needed moving to and from a bed to a chair (including a wheelchair)?: A Little Help needed standing up from a chair using your arms (e.g., wheelchair or bedside chair)?: A Little Help needed to walk in hospital room?: A Little Help needed climbing 3-5 steps with a railing? : A Little 6 Click Score: 18    End of Session Equipment Utilized During Treatment: Gait belt Activity Tolerance: Patient tolerated treatment well Patient left: in chair;with call bell/phone within reach;with chair alarm set Nurse Communication: Mobility status PT Visit Diagnosis: Unsteadiness on feet (R26.81);Other abnormalities of gait and mobility (R26.89);Muscle weakness (generalized) (M62.81)    Time: 8786-7672 PT Time Calculation (min) (ACUTE ONLY): 42 min   Charges:   PT Evaluation $PT Eval Moderate Complexity: 1 Mod PT Treatments $Gait Training: 8-22 mins $Therapeutic Activity: 8-22 mins        Lanney Gins, PT, DPT Supplemental Physical Therapist 01/25/19 12:00 PM Pager:  [phone] Office: [phone]

## 2019-01-25 NOTE — Progress Notes (Signed)
Pt's wife had concerns about setting up PT at home with [facility] although pt stated insurance would not cover HHPT. PT appt was already set up for 2/11 after F/U appt on 2/9. Instructed to call [facility] on Monday if any questions remain.

## 2019-01-27 ENCOUNTER — Telehealth: Payer: Self-pay | Admitting: Orthopaedic Surgery

## 2019-01-27 NOTE — Telephone Encounter (Signed)
Maria from [facility] called.   She is requesting PT verbal orders for the patient   3wk2 and 1wk1  Call back number: [phone]

## 2019-01-27 NOTE — Discharge Summary (Signed)
Patient ID: Brett [name] MRN: [id number] DOB/AGE: [birth year] 59 y.o.  Admit date: 01/24/2019 Discharge date: 01/27/2019  Admission Diagnoses:  Primary osteoarthritis of left knee  Discharge Diagnoses:  Principal Problem:   Primary osteoarthritis of left knee Active Problems:   Status post total left knee replacement   Past Medical History:  Diagnosis Date  . Allergy    albuterol as needed  . Anal fissure   . Arthritis   . Colon polyps 01/13/11   Colonoscopy  . GERD (gastroesophageal reflux disease)   . Pneumonia   . Pre-diabetes     Surgeries: Procedure(s): LEFT TOTAL KNEE ARTHROPLASTY on 01/24/2019   Consultants (if any):   Discharged Condition: Improved  Hospital Course: Brett [name] is an 58 y.o. male who was admitted 01/24/2019 with a diagnosis of Primary osteoarthritis of left knee and went to the operating room on 01/24/2019 and underwent the above named procedures.    He was given perioperative antibiotics:  Anti-infectives (From admission, onward)   Start     Dose/Rate Route Frequency Ordered Stop   01/25/19 0200  vancomycin (VANCOREADY) IVPB 1250 mg/250 mL  Status:  Discontinued     1,250 mg 166.7 mL/hr over 90 Minutes Intravenous Every 12 hours 01/24/19 1801 01/25/19 2040   01/24/19 1422  vancomycin (VANCOCIN) powder  Status:  Discontinued       As needed 01/24/19 1422 01/24/19 1614   01/24/19 1145  vancomycin (VANCOCIN) IVPB 1000 mg/200 mL premix     1,000 mg 200 mL/hr over 60 Minutes Intravenous On call to O.R. 01/24/19 1138 01/24/19 1402    .  He was given sequential compression devices, early ambulation, and appropriate chemoprophylaxis for DVT prophylaxis.  He benefited maximally from the hospital stay and there were no complications.    Recent vital signs:  Vitals:   01/25/19 0402 01/25/19 0827  BP: 121/88 124/74  Pulse: (!) 107 97  Resp: 14 20  Temp: 98.7 F (37.1 C) 98 F (36.7 C)  SpO2: 97%     Recent laboratory studies:   Lab Results  Component Value Date   HGB 12.4 (L) 01/25/2019   HGB 13.5 01/16/2019   HGB 12.2 (L) 08/22/2018   Lab Results  Component Value Date   WBC 10.1 01/25/2019   PLT 221 01/25/2019   Lab Results  Component Value Date   INR 0.9 01/16/2019   Lab Results  Component Value Date   NA 137 01/25/2019   K 4.6 01/25/2019   CL 103 01/25/2019   CO2 24 01/25/2019   BUN 14 01/25/2019   CREATININE 1.23 01/25/2019   GLUCOSE 112 (H) 01/25/2019    Discharge Medications:   Allergies as of 01/25/2019      Reactions   Benzodiazepines Hives   Chantix  [varenicline] Hives   Cialis [tadalafil]    Hives   Dicyclomine Hives   Meloxicam    Rash   Penicillins Hives   DID THE REACTION INVOLVE: Swelling of the face/tongue/throat, SOB, or low BP? Unknown Sudden or severe rash/hives, skin peeling, or the inside of the mouth or nose? Unknown Did it require medical treatment? n When did it last happen?Over 5 years ago. If all above answers are "NO", may proceed with cephalosporin use.   Relafen [nabumetone]    Hives      Medication List    TAKE these medications   aspirin EC 81 MG tablet Take 1 tablet (81 mg total) by mouth 2 (two) times daily.   blood  glucose meter kit and supplies Test blood sugar twice daily or as directed by YOUR provider.   blood glucose meter kit and supplies Dispense based insurance preference. E11.22   docusate sodium 100 MG capsule Commonly known as: COLACE Take 100 mg by mouth 2 (two) times daily as needed (constipation).   gabapentin 600 MG tablet Commonly known as: Neurontin Take 1/2 to 1 tablet 2 to 3 x /Daily as needed for Pain   glucose blood test strip Use as instructed   ibuprofen 800 MG tablet Commonly known as: ADVIL Take 1 tablet 3 x /day with Meals for pain What changed:   how much to take  how to take this  when to take this  reasons to take this  additional instructions   ketorolac 10 MG tablet Commonly known as:  TORADOL Take 1 tablet (10 mg total) by mouth 2 (two) times daily as needed.   ketorolac 10 MG tablet Commonly known as: TORADOL Take 1 tablet (10 mg total) by mouth 2 (two) times daily as needed.   methocarbamol 500 MG tablet Commonly known as: Robaxin Take 1 tablet (500 mg total) by mouth 2 (two) times daily as needed. What changed: reasons to take this   methocarbamol 750 MG tablet Commonly known as: ROBAXIN Take 1 tablet (750 mg total) by mouth 2 (two) times daily as needed for muscle spasms. What changed: Another medication with the same name was changed. Make sure you understand how and when to take each.   omeprazole 40 MG capsule Commonly known as: PriLOSEC Take 1 capsule (40 mg total) by mouth daily.   ondansetron 4 MG tablet Commonly known as: ZOFRAN Take 1-2 tablets (4-8 mg total) by mouth every 8 (eight) hours as needed for nausea or vomiting.   oxyCODONE 5 MG immediate release tablet Commonly known as: Oxy IR/ROXICODONE Take 1-2 tablets (5-10 mg total) by mouth every 8 (eight) hours as needed for severe pain.   oxyCODONE 10 mg 12 hr tablet Commonly known as: OXYCONTIN Take 1 tablet (10 mg total) by mouth every 12 (twelve) hours for 3 days.   Pennsaid 2 % Soln Generic drug: Diclofenac Sodium Apply 2 g topically 2 (two) times daily as needed (to affected area). What changed: reasons to take this   predniSONE 10 MG (21) Tbpk tablet Commonly known as: STERAPRED UNI-PAK 21 TAB Take as directed   senna-docusate 8.6-50 MG tablet Commonly known as: Senokot S Take 1-2 tablets by mouth at bedtime as needed.   traMADol 50 MG tablet Commonly known as: ULTRAM Take 1-2 po bid prn pain What changed:   how much to take  how to take this  when to take this  additional instructions   Vitamin D-3 125 MCG (5000 UT) Tabs Take 5,000 Units by mouth daily.       Diagnostic Studies: MR Lumbar Spine w/o contrast  Result Date: 01/18/2019 CLINICAL DATA:  Back pain  and right leg pain for 3 weeks EXAM: MRI LUMBAR SPINE WITHOUT CONTRAST TECHNIQUE: Multiplanar, multisequence MR imaging of the lumbar spine was performed. No intravenous contrast was administered. COMPARISON:  X-ray 01/02/2019 FINDINGS: Segmentation:  Standard. Alignment:  Physiologic. Vertebrae:  No fracture, evidence of discitis, or bone lesion. Conus medullaris and cauda equina: Conus extends to the T12-L1 level. Conus and cauda equina appear normal. Paraspinal and other soft tissues: Negative. Disc levels: T12-L1: Sagittal sequences only. No significant disc protrusion, foraminal stenosis, or canal stenosis. L1-L2: Mild diffuse disc bulge and mild right sided  facet arthropathy without significant foraminal or canal stenosis. L2-L3: Mild diffuse disc bulge with left foraminal protrusion results in moderate left foraminal stenosis. No canal stenosis. L3-L4: Mild diffuse disc bulge and mild bilateral facet arthrosis resulting in mild bilateral foraminal stenosis. No canal stenosis. L4-L5: Diffuse disc bulge and bilateral facet arthrosis resulting in moderate to severe right and mild left foraminal stenosis. No canal stenosis. L5-S1: Mild bilateral facet arthrosis. No significant disc protrusion, foraminal stenosis, or canal stenosis. IMPRESSION: 1. Multilevel degenerative changes of the lumbar spine greatest at L4-L5 where there is moderate-to-severe right and mild left foraminal stenosis. 2. Moderate left foraminal stenosis at L2-L3. 3. No significant canal stenosis at any level. Electronically Signed   By: Davina Poke D.O.   On: 01/18/2019 13:17   DG Knee Left Port  Result Date: 01/24/2019 CLINICAL DATA:  Left knee arthroplasty EXAM: PORTABLE LEFT KNEE - 1-2 VIEW COMPARISON:  06/18/2018 FINDINGS: Interval postsurgical changes from left total knee arthroplasty. Arthroplasty components are in their expected alignment without periprosthetic fracture. There are expected postoperative changes within the soft  tissues anteriorly. IMPRESSION: Satisfactory postoperative appearance of left total knee arthroplasty. Electronically Signed   By: Davina Poke D.O.   On: 01/24/2019 17:03   XR Lumbar Spine 2-3 Views  Result Date: 01/02/2019 Diffuse degenerative disc disease   Disposition: Discharge disposition: 01-Home or Self Care         Follow-up Information    Leandrew Koyanagi, MD. Go on 02/11/2019.   Specialty: Orthopedic Surgery Why: at 3:00 pm For suture removal, For wound re-check Contact information: [place] [address] [phone]        Home, [facility] up.   Specialty: Home Health Services Why: Someone from the home health agency will be in contact after discharge to scheduled your first in home physical therapy visit. Contact information: [address] [place] [address] [place]. Go on 02/13/2019.   Why: at 11:45 am for your first Outpatient Physical Therapy appointment. [facility] [address] [place], [place] [address] [phone]           Signed: Eduard Roux 01/27/2019, 10:43 AM

## 2019-01-27 NOTE — Telephone Encounter (Signed)
Pt called in regarding the level of compression his compression socks are, pt would like a call back so this can be further explained.   [phone]

## 2019-01-27 NOTE — Telephone Encounter (Signed)
Called maria back to approve orders.

## 2019-01-28 ENCOUNTER — Encounter: Payer: Self-pay | Admitting: *Deleted

## 2019-01-28 NOTE — Telephone Encounter (Signed)
Talked with patient and advised him of message below, per Jari Sportsman, PA

## 2019-01-28 NOTE — Telephone Encounter (Signed)
Can you call patient please to advise.

## 2019-01-28 NOTE — Telephone Encounter (Signed)
20-30 mmhg.  Wear them during the day, but can take them off at night while sleeping

## 2019-01-29 ENCOUNTER — Telehealth: Payer: Self-pay | Admitting: Orthopaedic Surgery

## 2019-01-29 NOTE — Telephone Encounter (Signed)
Pt called in said he had surgery with Dr.Xu on 01-24-19 on his left leg, but pt states they put 2 compression stocking on both of his legs but wanted to know if he had to continue to wear the one on the right or if they were even suppose to put one on his right leg since he didn't have anything done to that leg?  [phone]

## 2019-01-29 NOTE — Telephone Encounter (Signed)
Pt is aware of message below.

## 2019-01-29 NOTE — Telephone Encounter (Signed)
Yes we recommend wearing both but if he only wants to wear one then the operative leg is definitely needed.

## 2019-01-29 NOTE — Telephone Encounter (Signed)
Can you let patient know for me please.

## 2019-02-11 ENCOUNTER — Telehealth: Payer: Self-pay | Admitting: Orthopaedic Surgery

## 2019-02-11 ENCOUNTER — Other Ambulatory Visit: Payer: Self-pay

## 2019-02-11 ENCOUNTER — Ambulatory Visit (INDEPENDENT_AMBULATORY_CARE_PROVIDER_SITE_OTHER): Payer: BC Managed Care – PPO | Admitting: Physician Assistant

## 2019-02-11 DIAGNOSIS — M4807 Spinal stenosis, lumbosacral region: Secondary | ICD-10-CM

## 2019-02-11 DIAGNOSIS — Z96652 Presence of left artificial knee joint: Secondary | ICD-10-CM

## 2019-02-11 MED ORDER — OXYCODONE-ACETAMINOPHEN 5-325 MG PO TABS: 1.0000 | ORAL_TABLET | Freq: Three times a day (TID) | ORAL | 0 refills | Status: DC | PRN

## 2019-02-11 NOTE — Telephone Encounter (Signed)
Patient called.   Wants to know if he is still supposed to be wearing his compression socks   Call back number: [phone]

## 2019-02-11 NOTE — Addendum Note (Signed)
Addended byPrescott Parma on: 02/11/2019 03:57 PM   Modules accepted: Orders

## 2019-02-11 NOTE — Telephone Encounter (Signed)
Ok to stop once he has no more swelling in his leg/ankle

## 2019-02-11 NOTE — Progress Notes (Signed)
Post-Op Visit Note   Patient: Brett [name]           Date of Birth: [birth year]           MRN: [id number] Visit Date: 02/11/2019 PCP: Lucky Cowboy, MD   Assessment & Plan:  Chief Complaint:  Chief Complaint  Patient presents with  . Left Knee - Routine Post Op   Visit Diagnoses:  1. S/P TKR (total knee replacement), left     Plan: Patient is a 59 year old gentleman who comes in today 2 weeks out left total knee replacement 01/24/2019.  He has been doing well.  He has been getting home health physical therapy.  He is scheduled for outpatient physical therapy this Thursday.  He has been in a moderate amount of pain which is relieved with oxycodone.  He does note that he ran out a week ago.  No fevers or chills.  Examination of his left knee reveals a well-healed surgical scar without complication.  Calf is soft nontender.  He is neurovascular intact distally.  At this point, he will continue with physical therapy.  I refilled his narcotics.  He will follow up with [place] in 4 weeks time for repeat evaluation and AP lateral x-rays of the left knee.  Call with concerns or questions in the meantime.  Follow-Up Instructions: Return in about 4 weeks (around 03/11/2019).   Orders:  No orders of the defined types were placed in this encounter.  No orders of the defined types were placed in this encounter.   Imaging: No new imaging  PMFS History: Patient Active Problem List   Diagnosis Date Noted  . Status post total left knee replacement 01/24/2019  . Primary osteoarthritis of left knee 12/17/2018  . Primary osteoarthritis of right knee 12/17/2018  . Complex tear of medial meniscus of right knee as current injury 08/06/2018  . Overweight (BMI 25.0-29.9) 08/08/2017  . Medication management 03/23/2014  . Vitamin D deficiency 03/23/2014  . Abnormal glucose 03/23/2014  . Hyperlipemia 10/31/2006  . GERD 10/31/2006   Past Medical History:  Diagnosis Date  . Allergy    albuterol  as needed  . Anal fissure   . Arthritis   . Colon polyps 01/13/11   Colonoscopy  . GERD (gastroesophageal reflux disease)   . Pneumonia   . Pre-diabetes     Family History  Problem Relation Age of Onset  . Rheum arthritis Mother   . Allergies Daughter   . Allergies Sister   . Asthma Daughter     Past Surgical History:  Procedure Laterality Date  . ANAL FISSURE REPAIR  02/24/11  . CHONDROPLASTY  08/14/2018   Procedure: CHONDROPLASTY;  Surgeon: Tarry Kos, MD;  Location: [facility];  Service: Orthopedics;;  . COLONOSCOPY    . KNEE ARTHROSCOPY WITH MEDIAL MENISECTOMY Left 08/14/2018   Procedure: LEFT KNEE ARTHROSCOPY WITH PARTIAL MEDIAL MENISCECTOMY;  Surgeon: Tarry Kos, MD;  Location: [facility];  Service: Orthopedics;  Laterality: Left;  . KNEE ARTHROSCOPY WITH MEDIAL MENISECTOMY Right 10/18/2018   Procedure: RIGHT KNEE ARTHROSCOPY WITH PARTIAL MEDIAL MENISCECTOMY;  Surgeon: Tarry Kos, MD;  Location: [facility];  Service: Orthopedics;  Laterality: Right;  . TOTAL KNEE ARTHROPLASTY Left 01/24/2019   Procedure: LEFT TOTAL KNEE ARTHROPLASTY;  Surgeon: Tarry Kos, MD;  Location[facility];  Service: Orthopedics;  Laterality: Left;   Social History   Occupational History    Employer: [employer]  Tobacco Use  . Smoking  status: Former Smoker    Packs/day: 0.00    Years: 27.00    Pack years: 0.00    Types: Cigarettes    Quit date: 08/20/2018    Years since quitting: 0.4  . Smokeless tobacco: Never Used  Substance and Sexual Activity  . Alcohol use: Yes    Comment: socially  . Drug use: No  . Sexual activity: Not on file

## 2019-02-13 ENCOUNTER — Encounter: Payer: Self-pay | Admitting: Physical Therapy

## 2019-02-13 ENCOUNTER — Ambulatory Visit (INDEPENDENT_AMBULATORY_CARE_PROVIDER_SITE_OTHER): Payer: BC Managed Care – PPO | Admitting: Physical Therapy

## 2019-02-13 ENCOUNTER — Other Ambulatory Visit: Payer: Self-pay

## 2019-02-13 DIAGNOSIS — R600 Localized edema: Secondary | ICD-10-CM

## 2019-02-13 DIAGNOSIS — M25662 Stiffness of left knee, not elsewhere classified: Secondary | ICD-10-CM | POA: Diagnosis not present

## 2019-02-13 DIAGNOSIS — M6281 Muscle weakness (generalized): Secondary | ICD-10-CM | POA: Diagnosis not present

## 2019-02-13 DIAGNOSIS — M25562 Pain in left knee: Secondary | ICD-10-CM | POA: Diagnosis not present

## 2019-02-13 DIAGNOSIS — R2689 Other abnormalities of gait and mobility: Secondary | ICD-10-CM

## 2019-02-13 NOTE — Patient Instructions (Signed)
Access Code: [email or web address]  URL: [email or web address]  Date: 02/13/2019  Prepared by: Moshe Cipro   Exercises Supine Quadricep Sets - 15 reps - 1 sets - 3-5x daily - 7x weekly Supine Active Straight Leg Raise - 15 reps - 1 sets - 3-5x daily - 7x weekly Supine Short Arc Quad - 15 reps - 1 sets - 3-5x daily - 7x weekly Supine Heel Slide with Strap - 15 reps - 1 sets - 3-5x daily - 7x weekly Seated Knee Flexion Stretch - 15 reps - 1 sets - 3-5x daily - 7x weekly

## 2019-02-13 NOTE — Therapy (Signed)
[facility] Physical Therapy [address] [place], [place], [address] Phone: [phone]   Fax:  [phone]  Physical Therapy Evaluation  Patient Details  Name: Brett [name] MRN: [id number] Date of Birth: 1960/12/17 Referring Provider (PT): Leandrew Koyanagi, MD   Encounter Date: 02/13/2019  PT End of Session - 02/13/19 1257    Visit Number  1    Number of Visits  18    Date for PT Re-Evaluation  03/27/19    PT Start Time  7989    PT Stop Time  1227    PT Time Calculation (min)  42 min    Activity Tolerance  Patient tolerated treatment well    Behavior During Therapy  [facility] for tasks assessed/performed       Past Medical History:  Diagnosis Date  . Allergy    albuterol as needed  . Anal fissure   . Arthritis   . Colon polyps 01/13/11   Colonoscopy  . GERD (gastroesophageal reflux disease)   . Pneumonia   . Pre-diabetes     Past Surgical History:  Procedure Laterality Date  . ANAL FISSURE REPAIR  02/24/11  . CHONDROPLASTY  08/14/2018   Procedure: CHONDROPLASTY;  Surgeon: Leandrew Koyanagi, MD;  Location: [place];  Service: Orthopedics;;  . COLONOSCOPY    . KNEE ARTHROSCOPY WITH MEDIAL MENISECTOMY Left 08/14/2018   Procedure: LEFT KNEE ARTHROSCOPY WITH PARTIAL MEDIAL MENISCECTOMY;  Surgeon: Leandrew Koyanagi, MD;  Location: [place];  Service: Orthopedics;  Laterality: Left;  . KNEE ARTHROSCOPY WITH MEDIAL MENISECTOMY Right 10/18/2018   Procedure: RIGHT KNEE ARTHROSCOPY WITH PARTIAL MEDIAL MENISCECTOMY;  Surgeon: Leandrew Koyanagi, MD;  Location: [place];  Service: Orthopedics;  Laterality: Right;  . TOTAL KNEE ARTHROPLASTY Left 01/24/2019   Procedure: LEFT TOTAL KNEE ARTHROPLASTY;  Surgeon: Leandrew Koyanagi, MD;  Location: [place];  Service: Orthopedics;  Laterality: Left;    There were no vitals filed for this visit.   Subjective Assessment - 02/13/19 1152    Subjective  Pt is a 59 y/o male who presents to OPPT  s/p Lt TKA on 01/24/2019.  Pt had HHPT which finished Monday of this week.  Pt still using RW for mobility at this time.    Pertinent History  OA    Limitations  Standing;Walking;Sitting    Patient Stated Goals  return to work    Currently in Pain?  Yes    Pain Score  7    up to 10/10; at best 5/10   Pain Location  Knee    Pain Orientation  Left    Pain Descriptors / Indicators  Tightness;Aching;Sharp   catching   Pain Type  Surgical pain;Acute pain    Pain Onset  1 to 4 weeks ago    Pain Frequency  Constant    Aggravating Factors   bending knee; sleeping, standing/walking for extended periods    Pain Relieving Factors  meds, ice/heat, repositioning         [facility] PT Assessment - 02/13/19 1157      Assessment   Medical Diagnosis  Lt TKA    Referring Provider (PT)  Leandrew Koyanagi, MD    Onset Date/Surgical Date  01/24/19    Hand Dominance  Right    Next MD Visit  03/11/19    Prior Therapy  HHPT x 5 visit      Precautions   Precautions  None      Restrictions  Weight Bearing Restrictions  No      Balance Screen   Has the patient fallen in the past 6 months  No    Has the patient had a decrease in activity level because of a fear of falling?   No    Is the patient reluctant to leave their home because of a fear of falling?   No      Home Public house manager residence    Living Arrangements  Spouse/significant other    Type of Home  House    Home Access  Stairs to enter    Entrance Stairs-Number of Steps  2    Home Layout  One level    Additional Comments  needs min A with shoes and compression socks at this time      Prior Function   Level of Independence  Independent    Vocation  Full time employment   out on [employer] - standing all day, some loading/unloading of supplies (soda carts)    Leisure  fishing, working out 3 days/wk (weights/cardio)      Cognition   Overall Cognitive Status  Within Functional Limits for  tasks assessed      Observation/Other Assessments   Observations  increased edema LLE      ROM / Strength   AROM / PROM / Strength  AROM;Strength;PROM      AROM   AROM Assessment Site  Knee    Right/Left Knee  Right;Left    Right Knee Extension  0    Right Knee Flexion  124    Left Knee Extension  -5    Left Knee Flexion  71      PROM   PROM Assessment Site  Knee    Right/Left Knee  Left    Left Knee Extension  -2    Left Knee Flexion  95      Strength   Overall Strength Comments  ~ 15 deg quad lag with SLR      Ambulation/Gait   Ambulation/Gait  Yes    Ambulation/Gait Assistance  5: Supervision    Ambulation Distance (Feet)  100 Feet    Assistive device  Rolling walker    Gait Pattern  Decreased stance time - left;Decreased step length - right                Objective measurements completed on examination: See above findings.      [facility] Adult PT Treatment/Exercise - 02/13/19 1157      Self-Care   Self-Care  Other Self-Care Comments    Other Self-Care Comments   verbally reviewed HEP as pt is already performing at home, recommended he continue with current HEP and will progres as appropriate             PT Education - 02/13/19 1256    Education Details  HEP    Person(s) Educated  Patient    Methods  Explanation;Demonstration;Handout    Comprehension  Verbalized understanding;Returned demonstration          PT Long Term Goals - 02/13/19 1301      PT LONG TERM GOAL #1   Title  independent with HEP    Status  New    Target Date  03/27/19      PT LONG TERM GOAL #2   Title  improve Lt knee AROM 0-110 for improved function    Status  New  Target Date  03/27/19      PT LONG TERM GOAL #3   Title  amb without AD without gait deviations for improved function and mobility    Status  New    Target Date  03/27/19      PT LONG TERM GOAL #4   Title  report pain < 3/10 with ambulation for improved function    Status  New    Target Date   03/27/19      PT LONG TERM GOAL #5   Title  demonstrate improved quad strength by performing at least 10 reps SLR without quad lag    Status  New    Target Date  03/27/19             Plan - 02/13/19 1257    Clinical Impression Statement  Pt is a 59 y/o male who presents to OPPT s/p Lt TKA on 01/24/19.  Pt demonstrates decreased strength, gait abnormalities and decreased ROM affecting functional mobility.  Pt will benefit from PT to maximize function.    Personal Factors and Comorbidities  Comorbidity 1    Comorbidities  OA    Examination-Activity Limitations  Locomotion Level;Stairs;Stand;Hygiene/Grooming;Transfers;Squat;Sit    Examination-Participation Restrictions  Other   occupation   Stability/Clinical Decision Making  Stable/Uncomplicated    Clinical Decision Making  Low    Rehab Potential  Good    PT Frequency  3x / week    PT Duration  6 weeks    PT Treatment/Interventions  ADLs/Self Care Home Management;Cryotherapy;Electrical Stimulation;Iontophoresis 4mg /ml Dexamethasone;Moist Heat;Balance training;Therapeutic exercise;Therapeutic activities;Functional mobility training;Stair training;Gait training;DME Instruction;Neuromuscular re-education;Patient/family education;Manual techniques;Vasopneumatic Device;Taping;Dry needling;Passive range of motion    PT Next Visit Plan  review HEP, aggressive ROM, manual/modalities PRN    PT Home Exercise Plan  Access Code: [email or web address]    Consulted and Agree with Plan of Care  Patient       Patient will benefit from skilled therapeutic intervention in order to improve the following deficits and impairments:  Abnormal gait, Pain, Decreased strength, Difficulty walking, Decreased mobility, Impaired flexibility, Decreased range of motion, Increased edema  Visit Diagnosis: Acute pain of left knee - Plan: PT plan of care cert/re-cert  Stiffness of left knee, not elsewhere classified - Plan: PT plan of care cert/re-cert  Other abnormalities of  gait and mobility - Plan: PT plan of care cert/re-cert  Muscle weakness (generalized) - Plan: PT plan of care cert/re-cert  Localized edema - Plan: PT plan of care cert/re-cert     Problem List Patient Active Problem List   Diagnosis Date Noted  . Status post total left knee replacement 01/24/2019  . Primary osteoarthritis of left knee 12/17/2018  . Primary osteoarthritis of right knee 12/17/2018  . Complex tear of medial meniscus of right knee as current injury 08/06/2018  . Overweight (BMI 25.0-29.9) 08/08/2017  . Medication management 03/23/2014  . Vitamin D deficiency 03/23/2014  . Abnormal glucose 03/23/2014  . Hyperlipemia 10/31/2006  . GERD 10/31/2006      11/02/2006, PT, DPT 02/13/19 1:05 PM      [facility] Physical Therapy [address] [place], [place], [place] Phone: [phone]   Fax:  [phone]  Name: Madoc Holquin MRN: Abbe Amsterdam Date of Birth: 01/27/1960

## 2019-02-13 NOTE — Telephone Encounter (Signed)
Can you Please let him know this.

## 2019-02-18 ENCOUNTER — Other Ambulatory Visit: Payer: Self-pay | Admitting: Physician Assistant

## 2019-02-18 ENCOUNTER — Encounter: Payer: Self-pay | Admitting: Physical Therapy

## 2019-02-18 ENCOUNTER — Telehealth: Payer: Self-pay | Admitting: Orthopaedic Surgery

## 2019-02-18 ENCOUNTER — Ambulatory Visit (INDEPENDENT_AMBULATORY_CARE_PROVIDER_SITE_OTHER): Payer: BC Managed Care – PPO | Admitting: Physical Therapy

## 2019-02-18 ENCOUNTER — Other Ambulatory Visit: Payer: Self-pay

## 2019-02-18 DIAGNOSIS — M25562 Pain in left knee: Secondary | ICD-10-CM | POA: Diagnosis not present

## 2019-02-18 DIAGNOSIS — R2689 Other abnormalities of gait and mobility: Secondary | ICD-10-CM

## 2019-02-18 DIAGNOSIS — R600 Localized edema: Secondary | ICD-10-CM

## 2019-02-18 DIAGNOSIS — M6281 Muscle weakness (generalized): Secondary | ICD-10-CM

## 2019-02-18 DIAGNOSIS — M25662 Stiffness of left knee, not elsewhere classified: Secondary | ICD-10-CM

## 2019-02-18 MED ORDER — HYDROCODONE-ACETAMINOPHEN 5-325 MG PO TABS: 1.0000 | ORAL_TABLET | Freq: Three times a day (TID) | ORAL | 0 refills | Status: DC | PRN

## 2019-02-18 NOTE — Therapy (Signed)
[facility] Physical Therapy [address] [place], [place], [address] Phone: [phone]   Fax:  [phone]  Physical Therapy Treatment  Patient Details  Name: Brett [name] MRN: [id number] Date of Birth: Sep 20, 1960 Referring Provider (PT): Gershon Mussel, MD   Encounter Date: 02/18/2019  PT End of Session - 02/18/19 1154    Visit Number  2    Number of Visits  18    Date for PT Re-Evaluation  03/27/19    PT Start Time  1148    PT Stop Time  1240    PT Time Calculation (min)  52 min    Activity Tolerance  Patient tolerated treatment well    Behavior During Therapy  [facility] for tasks assessed/performed       Past Medical History:  Diagnosis Date  . Allergy    albuterol as needed  . Anal fissure   . Arthritis   . Colon polyps 01/13/11   Colonoscopy  . GERD (gastroesophageal reflux disease)   . Pneumonia   . Pre-diabetes     Past Surgical History:  Procedure Laterality Date  . ANAL FISSURE REPAIR  02/24/11  . CHONDROPLASTY  08/14/2018   Procedure: CHONDROPLASTY;  Surgeon: Tarry Kos, MD;  Location: [facility];  Service: Orthopedics;;  . COLONOSCOPY    . KNEE ARTHROSCOPY WITH MEDIAL MENISECTOMY Left 08/14/2018   Procedure: LEFT KNEE ARTHROSCOPY WITH PARTIAL MEDIAL MENISCECTOMY;  Surgeon: Tarry Kos, MD;  Location: [facility];  Service: Orthopedics;  Laterality: Left;  . KNEE ARTHROSCOPY WITH MEDIAL MENISECTOMY Right 10/18/2018   Procedure: RIGHT KNEE ARTHROSCOPY WITH PARTIAL MEDIAL MENISCECTOMY;  Surgeon: Tarry Kos, MD;  Location: [facility];  Service: Orthopedics;  Laterality: Right;  . TOTAL KNEE ARTHROPLASTY Left 01/24/2019   Procedure: LEFT TOTAL KNEE ARTHROPLASTY;  Surgeon: Tarry Kos, MD;  Location: [facility];  Service: Orthopedics;  Laterality: Left;    There were no vitals filed for this visit.  Subjective Assessment - 02/18/19 1152    Subjective  Pt arriving today reporting 9/10 pain in his  left knee and reporting his balance seems off.    Pertinent History  OA    Limitations  Standing;Walking;Sitting    Patient Stated Goals  return to work    Currently in Pain?  Yes    Pain Score  9     Pain Location  Knee    Pain Orientation  Left    Pain Descriptors / Indicators  Aching;Tightness    Pain Type  Surgical pain;Acute pain    Pain Onset  1 to 4 weeks ago         [facility] PT Assessment - 02/18/19 0001      Assessment   Medical Diagnosis  Left TKA    Referring Provider (PT)  Gershon Mussel, MD    Onset Date/Surgical Date  01/24/19    Hand Dominance  Right    Next MD Visit  03/11/2019      Precautions   Precautions  None      Restrictions   Weight Bearing Restrictions  No      Observation/Other Assessments   Observations  increased edema LLE      AROM   AROM Assessment Site  Knee    Right/Left Knee  Left    Left Knee Extension  -5    Left Knee Flexion  86      PROM   PROM Assessment Site  Knee    Right/Left  Knee  Left    Left Knee Extension  -4    Left Knee Flexion  95                   [facility] Adult PT Treatment/Exercise - 02/18/19 0001      Exercises   Exercises  Knee/Hip      Knee/Hip Exercises: Stretches   Active Hamstring Stretch  Left;2 reps;30 seconds      Knee/Hip Exercises: Aerobic   Nustep  L2 x 6 minutes      Knee/Hip Exercises: Standing   Heel Raises  Right;Left;15 reps    Hip Flexion  Stengthening;Both;15 reps;Limitations    Hip Flexion Limitations  UE support      Knee/Hip Exercises: Seated   Long Arc Quad  AROM;Strengthening;Left;15 reps      Knee/Hip Exercises: Supine   Quad Sets  Strengthening;15 reps    Short Arc Quad Sets  Strengthening;15 reps    Heel Slides  AROM;Strengthening;15 reps    Straight Leg Raises  Strengthening;10 reps    Other Supine Knee/Hip Exercises  clams with L1 theraband x 15 reps      Modalities   Modalities  Cryotherapy      Cryotherapy   Number Minutes Cryotherapy  10 Minutes     Cryotherapy Location  Knee    Type of Cryotherapy  Ice pack      Manual Therapy   Manual Therapy  Passive ROM    Manual therapy comments  10 minutes    Passive ROM  patella mobs, knee flexion with ossicillations and knee extension with overpressure             PT Education - 02/18/19 1244    Education Details  HEP printout issued to pt, edu and encouraged to ice and elevate    Person(s) Educated  Patient    Methods  Explanation;Handout;Demonstration    Comprehension  Verbalized understanding;Returned demonstration          PT Long Term Goals - 02/18/19 1155      PT LONG TERM GOAL #1   Title  independent with HEP    Status  On-going      PT LONG TERM GOAL #2   Title  improve Lt knee AROM 0-110 for improved function    Status  On-going      PT LONG TERM GOAL #3   Title  amb without AD without gait deviations for improved function and mobility    Status  On-going      PT LONG TERM GOAL #4   Title  report pain < 3/10 with ambulation for improved function    Status  On-going            Plan - 02/18/19 1249    Clinical Impression Statement  Pt arriving to therpay reporting 9/10 pain in left knee. However pt tolerated all exercises well. Pt making progress in AROM 5-86 degree arc. Marland Kitchen PROM arc 4-95 degrees. Pt edu in elevation and icing at home to help with edema. HEP handout issued today with concentration on SLRand QS for quad strengthening and knee flexion. Pt still with mild extensor lag, but improvements made since last visit. Pt reporting his CPM was picked up yesterday. Continue skilled PT progressing toward pt's PLOF.    Comorbidities  OA    Examination-Activity Limitations  Locomotion Level;Stairs;Stand;Hygiene/Grooming;Transfers;Squat;Sit    Examination-Participation Restrictions  Other    Stability/Clinical Decision Making  Stable/Uncomplicated    Rehab Potential  Good  PT Frequency  3x / week    PT Duration  6 weeks    PT Treatment/Interventions   ADLs/Self Care Home Management;Cryotherapy;Electrical Stimulation;Iontophoresis 4mg /ml Dexamethasone;Moist Heat;Balance training;Therapeutic exercise;Therapeutic activities;Functional mobility training;Stair training;Gait training;DME Instruction;Neuromuscular re-education;Patient/family education;Manual techniques;Vasopneumatic Device;Taping;Dry needling;Passive range of motion    PT Next Visit Plan  review HEP, aggressive ROM, manual/modalities PRN    PT Home Exercise Plan  Access Code: [email or web address]    Consulted and Agree with Plan of Care  Patient       Patient will benefit from skilled therapeutic intervention in order to improve the following deficits and impairments:  Abnormal gait, Pain, Decreased strength, Difficulty walking, Decreased mobility, Impaired flexibility, Decreased range of motion, Increased edema  Visit Diagnosis: Acute pain of left knee  Stiffness of left knee, not elsewhere classified  Other abnormalities of gait and mobility  Muscle weakness (generalized)  Localized edema     Problem List Patient Active Problem List   Diagnosis Date Noted  . Status post total left knee replacement 01/24/2019  . Primary osteoarthritis of left knee 12/17/2018  . Primary osteoarthritis of right knee 12/17/2018  . Complex tear of medial meniscus of right knee as current injury 08/06/2018  . Overweight (BMI 25.0-29.9) 08/08/2017  . Medication management 03/23/2014  . Vitamin D deficiency 03/23/2014  . Abnormal glucose 03/23/2014  . Hyperlipemia 10/31/2006  . GERD 10/31/2006    11/02/2006, PT 02/18/2019, 12:56 PM  [facility] Physical Therapy [address] [place], [place], [place] Phone: [phone]   Fax:  [phone]  Name: Brett [name] MRN: Abbe Amsterdam Date of Birth: [birth year]

## 2019-02-18 NOTE — Telephone Encounter (Signed)
Patient is out of Oxycodone and asking for refill/.  [phone]

## 2019-02-18 NOTE — Telephone Encounter (Signed)
Weaning to norco and I sent in

## 2019-02-19 NOTE — Telephone Encounter (Signed)
Called pt to advise. Pt said when he happens to wear the socks he has more swelling than when he doesn't wear them and he is wanting to know if that is normal? Pt is wondering why he is having sharpe pain in his calf area on side and is constant with or without the socks.   Please advise   [phone]

## 2019-02-19 NOTE — Telephone Encounter (Signed)
Let's get doppler to r/o DVT.  He should continue to elevate.

## 2019-02-20 ENCOUNTER — Encounter: Payer: BC Managed Care – PPO | Admitting: Physical Therapy

## 2019-02-21 ENCOUNTER — Other Ambulatory Visit: Payer: Self-pay

## 2019-02-21 DIAGNOSIS — M7989 Other specified soft tissue disorders: Secondary | ICD-10-CM

## 2019-02-21 DIAGNOSIS — Z96652 Presence of left artificial knee joint: Secondary | ICD-10-CM

## 2019-02-21 NOTE — Telephone Encounter (Signed)
Called patient to advise on message below. He did not answer LMOM.   STAT US PLEASE SABRINA.

## 2019-02-24 ENCOUNTER — Ambulatory Visit: Payer: Self-pay

## 2019-02-24 ENCOUNTER — Ambulatory Visit (HOSPITAL_COMMUNITY)
Admission: RE | Admit: 2019-02-24 | Discharge: 2019-02-24 | Disposition: A | Discharge status: Home / Self Care | Payer: BC Managed Care – PPO | Source: Ambulatory Visit | Attending: Orthopaedic Surgery | Admitting: Orthopaedic Surgery

## 2019-02-24 ENCOUNTER — Ambulatory Visit (INDEPENDENT_AMBULATORY_CARE_PROVIDER_SITE_OTHER): Payer: BC Managed Care – PPO | Admitting: Physical Medicine and Rehabilitation

## 2019-02-24 ENCOUNTER — Encounter: Payer: Self-pay | Admitting: Physical Medicine and Rehabilitation

## 2019-02-24 ENCOUNTER — Other Ambulatory Visit: Payer: Self-pay

## 2019-02-24 VITALS — BP 144/91 | HR 94

## 2019-02-24 DIAGNOSIS — M7989 Other specified soft tissue disorders: Secondary | ICD-10-CM | POA: Diagnosis not present

## 2019-02-24 DIAGNOSIS — M5416 Radiculopathy, lumbar region: Secondary | ICD-10-CM

## 2019-02-24 DIAGNOSIS — M48061 Spinal stenosis, lumbar region without neurogenic claudication: Secondary | ICD-10-CM

## 2019-02-24 DIAGNOSIS — Z96652 Presence of left artificial knee joint: Secondary | ICD-10-CM | POA: Diagnosis present

## 2019-02-24 MED ORDER — METHYLPREDNISOLONE ACETATE 80 MG/ML IJ SUSP
40.0000 mg | Freq: Once | INTRAMUSCULAR | Status: AC
  Administered 2019-02-24: 09:00:00 40 mg

## 2019-02-24 NOTE — Progress Notes (Signed)
Lower extremity venous has been completed.   Preliminary results in CV Proc.   Blanch Media 02/24/2019 1:21 PM

## 2019-02-24 NOTE — Progress Notes (Signed)
Brett [name] - 59 y.o. male MRN [id number]  Date of birth: 1960/08/27  Office Visit Note: Visit Date: 02/24/2019 PCP: Lucky Cowboy, MD Referred by: Lucky Cowboy, MD  Subjective: Chief Complaint  Patient presents with  . Lower Back - Pain  . Right Leg - Pain  . Right Foot - Pain   HPI: Brett [name] is a 59 y.o. male who comes in today For planned right L4 transforaminal epidural steroid injection at the request of Tessa Lerner, PA-C and Dr. Glee Arvin.  He is having radicular right leg pain down to the foot.  His symptoms are a pretty classic L5 distribution.  This goes posterior lateral thigh into the anterior lateral shin and top of the foot.  MRI shows broad bulging with facet arthritis at L4-5 with lateral recess narrowing to mild but also moderate severe right foraminal narrowing.  Foraminal narrowing should cause more of an L4 distribution.  I elected today and did talk to the patient to try a right L5 transforaminal injection.  Depending on relief would look at L4 transforaminal approach versus interlaminar approach.  The patient has failed conservative care including home exercise, medications, time and activity modification.  This injection will be diagnostic and hopefully therapeutic.  Please see requesting physician notes for further details and justification.   ROS Otherwise per HPI.  Assessment & Plan: Visit Diagnoses:  1. Lumbar radiculopathy   2. Foraminal stenosis of lumbar region     Plan: No additional findings.   Meds & Orders:  Meds ordered this encounter  Medications  . methylPREDNISolone acetate (DEPO-MEDROL) injection 40 mg    Orders Placed This Encounter  Procedures  . XR C-ARM NO REPORT  . Epidural Steroid injection    Follow-up: Return if symptoms worsen or fail to improve.   Procedures: No procedures performed  Lumbosacral Transforaminal Epidural Steroid Injection - Sub-Pedicular Approach with Fluoroscopic Guidance  Patient:  Brett [name]      Date of Birth: [birth year] MRN: [id number] PCP: Lucky Cowboy, MD      Visit Date: 02/24/2019   Universal Protocol:    Date/Time: 02/24/2019  Consent Given By: the patient  Position: PRONE  Additional Comments: Vital signs were monitored before and after the procedure. Patient was prepped and draped in the usual sterile fashion. The correct patient, procedure, and site was verified.   Injection Procedure Details:  Procedure Site One Meds Administered:  Meds ordered this encounter  Medications  . methylPREDNISolone acetate (DEPO-MEDROL) injection 40 mg    Laterality: Right  Location/Site:  L5-S1  Needle size: 22 G  Needle type: Spinal  Needle Placement: Transforaminal  Findings:    -Comments: Excellent flow of contrast along the nerve and into the epidural space.  Procedure Details: After squaring off the end-plates to get a true AP view, the C-arm was positioned so that an oblique view of the foramen as noted above was visualized. The target area is just inferior to the "nose of the scotty dog" or sub pedicular. The soft tissues overlying this structure were infiltrated with 2-3 ml. of 1% Lidocaine without Epinephrine.  The spinal needle was inserted toward the target using a "trajectory" view along the fluoroscope beam.  Under AP and lateral visualization, the needle was advanced so it did not puncture dura and was located close the 6 O'Clock position of the pedical in AP tracterory. Biplanar projections were used to confirm position. Aspiration was confirmed to be negative for CSF and/or blood. A 1-2 ml. volume of  Isovue-250 was injected and flow of contrast was noted at each level. Radiographs were obtained for documentation purposes.   After attaining the desired flow of contrast documented above, a 0.5 to 1.0 ml test dose of 0.25% Marcaine was injected into each respective transforaminal space.  The patient was observed for 90 seconds post  injection.  After no sensory deficits were reported, and normal lower extremity motor function was noted,   the above injectate was administered so that equal amounts of the injectate were placed at each foramen (level) into the transforaminal epidural space.   Additional Comments:  The patient tolerated the procedure well Dressing: 2 x 2 sterile gauze and Band-Aid    Post-procedure details: Patient was observed during the procedure. Post-procedure instructions were reviewed.  Patient left the clinic in stable condition.     Clinical History: MRI LUMBAR SPINE WITHOUT CONTRAST  TECHNIQUE: Multiplanar, multisequence MR imaging of the lumbar spine was performed. No intravenous contrast was administered.  COMPARISON:  X-ray 01/02/2019  FINDINGS: Segmentation:  Standard.  Alignment:  Physiologic.  Vertebrae:  No fracture, evidence of discitis, or bone lesion.  Conus medullaris and cauda equina: Conus extends to the T12-L1 level. Conus and cauda equina appear normal.  Paraspinal and other soft tissues: Negative.  Disc levels:  T12-L1: Sagittal sequences only. No significant disc protrusion, foraminal stenosis, or canal stenosis.  L1-L2: Mild diffuse disc bulge and mild right sided facet arthropathy without significant foraminal or canal stenosis.  L2-L3: Mild diffuse disc bulge with left foraminal protrusion results in moderate left foraminal stenosis. No canal stenosis.  L3-L4: Mild diffuse disc bulge and mild bilateral facet arthrosis resulting in mild bilateral foraminal stenosis. No canal stenosis.  L4-L5: Diffuse disc bulge and bilateral facet arthrosis resulting in moderate to severe right and mild left foraminal stenosis. No canal stenosis.  L5-S1: Mild bilateral facet arthrosis. No significant disc protrusion, foraminal stenosis, or canal stenosis.  IMPRESSION: 1. Multilevel degenerative changes of the lumbar spine greatest at L4-L5 where  there is moderate-to-severe right and mild left foraminal stenosis. 2. Moderate left foraminal stenosis at L2-L3. 3. No significant canal stenosis at any level.   Electronically Signed   By: Davina Poke D.O.   On: 01/18/2019 13:17   He reports that he quit smoking about 6 months ago. His smoking use included cigarettes. He smoked 0.00 packs per day for 27.00 years. He has never used smokeless tobacco.  Recent Labs    08/21/18 1527 01/16/19 0849  HGBA1C 6.2* 6.2*    Objective:  VS:  HT:    WT:   BMI:     BP:(!) 144/91  HR:94bpm  TEMP: ( )  RESP:  Physical Exam  Ortho Exam Imaging: XR C-ARM NO REPORT  Result Date: 02/24/2019 Please see Notes tab for imaging impression.   Past Medical/Family/Surgical/Social History: Medications & Allergies reviewed per EMR, new medications updated. Patient Active Problem List   Diagnosis Date Noted  . Status post total left knee replacement 01/24/2019  . Primary osteoarthritis of left knee 12/17/2018  . Primary osteoarthritis of right knee 12/17/2018  . Complex tear of medial meniscus of right knee as current injury 08/06/2018  . Overweight (BMI 25.0-29.9) 08/08/2017  . Medication management 03/23/2014  . Vitamin D deficiency 03/23/2014  . Abnormal glucose 03/23/2014  . Hyperlipemia 10/31/2006  . GERD 10/31/2006   Past Medical History:  Diagnosis Date  . Allergy    albuterol as needed  . Anal fissure   . Arthritis   .  Colon polyps 01/13/11   Colonoscopy  . GERD (gastroesophageal reflux disease)   . Pneumonia   . Pre-diabetes    Family History  Problem Relation Age of Onset  . Rheum arthritis Mother   . Allergies Daughter   . Allergies Sister   . Asthma Daughter    Past Surgical History:  Procedure Laterality Date  . ANAL FISSURE REPAIR  02/24/11  . CHONDROPLASTY  08/14/2018   Procedure: CHONDROPLASTY;  Surgeon: Tarry Kos, MD;  Location: [facility];  Service: Orthopedics;;  . COLONOSCOPY    .  KNEE ARTHROSCOPY WITH MEDIAL MENISECTOMY Left 08/14/2018   Procedure: LEFT KNEE ARTHROSCOPY WITH PARTIAL MEDIAL MENISCECTOMY;  Surgeon: Tarry Kos, MD;  Location: [facility];  Service: Orthopedics;  Laterality: Left;  . KNEE ARTHROSCOPY WITH MEDIAL MENISECTOMY Right 10/18/2018   Procedure: RIGHT KNEE ARTHROSCOPY WITH PARTIAL MEDIAL MENISCECTOMY;  Surgeon: Tarry Kos, MD;  Location:  SUR[facility]ERY C[facility];  Service: Orthopedics;  Laterality: Right;  . TOTAL KNEE ARTHROPLASTY Left 01/24/2019   Procedure: LEFT TOTAL KNEE ARTHROPLASTY;  Surgeon: Tarry Kos, MD;  Location: [facility];  Service: Orthopedics;  Laterality: Left;   Social History   Occupational History    Employer: [employer]  Tobacco Use  . Smoking status: Former Smoker    Packs/day: 0.00    Years: 27.00    Pack years: 0.00    Types: Cigarettes    Quit date: 08/20/2018    Years since quitting: 0.5  . Smokeless tobacco: Never Used  Substance and Sexual Activity  . Alcohol use: Yes    Comment: socially  . Drug use: No  . Sexual activity: Not on file

## 2019-02-24 NOTE — Telephone Encounter (Signed)
Pt is scheduled today 02/24/19 at [facility] at 1:00pm, pt is aware of appt

## 2019-02-24 NOTE — Procedures (Signed)
Lumbosacral Transforaminal Epidural Steroid Injection - Sub-Pedicular Approach with Fluoroscopic Guidance  Patient: Brett [name]      Date of Birth: Feb 15, 1960 MRN: [id number] PCP: Lucky Cowboy, MD      Visit Date: 02/24/2019   Universal Protocol:    Date/Time: 02/24/2019  Consent Given By: the patient  Position: PRONE  Additional Comments: Vital signs were monitored before and after the procedure. Patient was prepped and draped in the usual sterile fashion. The correct patient, procedure, and site was verified.   Injection Procedure Details:  Procedure Site One Meds Administered:  Meds ordered this encounter  Medications  . methylPREDNISolone acetate (DEPO-MEDROL) injection 40 mg    Laterality: Right  Location/Site:  L5-S1  Needle size: 22 G  Needle type: Spinal  Needle Placement: Transforaminal  Findings:    -Comments: Excellent flow of contrast along the nerve and into the epidural space.  Procedure Details: After squaring off the end-plates to get a true AP view, the C-arm was positioned so that an oblique view of the foramen as noted above was visualized. The target area is just inferior to the "nose of the scotty dog" or sub pedicular. The soft tissues overlying this structure were infiltrated with 2-3 ml. of 1% Lidocaine without Epinephrine.  The spinal needle was inserted toward the target using a "trajectory" view along the fluoroscope beam.  Under AP and lateral visualization, the needle was advanced so it did not puncture dura and was located close the 6 O'Clock position of the pedical in AP tracterory. Biplanar projections were used to confirm position. Aspiration was confirmed to be negative for CSF and/or blood. A 1-2 ml. volume of Isovue-250 was injected and flow of contrast was noted at each level. Radiographs were obtained for documentation purposes.   After attaining the desired flow of contrast documented above, a 0.5 to 1.0 ml test dose of  0.25% Marcaine was injected into each respective transforaminal space.  The patient was observed for 90 seconds post injection.  After no sensory deficits were reported, and normal lower extremity motor function was noted,   the above injectate was administered so that equal amounts of the injectate were placed at each foramen (level) into the transforaminal epidural space.   Additional Comments:  The patient tolerated the procedure well Dressing: 2 x 2 sterile gauze and Band-Aid    Post-procedure details: Patient was observed during the procedure. Post-procedure instructions were reviewed.  Patient left the clinic in stable condition.

## 2019-02-24 NOTE — Progress Notes (Signed)
.  Numeric Pain Rating Scale and Functional Assessment Average Pain 7   In the last MONTH (on 0-10 scale) has pain interfered with the following?  1. General activity like being  able to carry out your everyday physical activities such as walking, climbing stairs, carrying groceries, or moving a chair?  Rating(7)   +Driver, -BT, -Dye Allergies.   

## 2019-02-26 ENCOUNTER — Other Ambulatory Visit: Payer: Self-pay | Admitting: Physician Assistant

## 2019-02-26 ENCOUNTER — Telehealth: Payer: Self-pay | Admitting: Orthopaedic Surgery

## 2019-02-26 MED ORDER — HYDROCODONE-ACETAMINOPHEN 5-325 MG PO TABS: 1.0000 | ORAL_TABLET | Freq: Two times a day (BID) | ORAL | 0 refills | Status: DC | PRN

## 2019-02-26 NOTE — Telephone Encounter (Signed)
Just sent in

## 2019-02-26 NOTE — Telephone Encounter (Signed)
Patient called. He would like a refill on Hydrocodone. His call back number is [phone]

## 2019-02-27 ENCOUNTER — Encounter: Payer: Self-pay | Admitting: Physical Therapy

## 2019-02-27 ENCOUNTER — Other Ambulatory Visit: Payer: Self-pay

## 2019-02-27 ENCOUNTER — Ambulatory Visit (INDEPENDENT_AMBULATORY_CARE_PROVIDER_SITE_OTHER): Payer: BC Managed Care – PPO | Admitting: Physical Therapy

## 2019-02-27 DIAGNOSIS — M6281 Muscle weakness (generalized): Secondary | ICD-10-CM

## 2019-02-27 DIAGNOSIS — R600 Localized edema: Secondary | ICD-10-CM

## 2019-02-27 DIAGNOSIS — M25562 Pain in left knee: Secondary | ICD-10-CM | POA: Diagnosis not present

## 2019-02-27 DIAGNOSIS — M25662 Stiffness of left knee, not elsewhere classified: Secondary | ICD-10-CM

## 2019-02-27 DIAGNOSIS — R2689 Other abnormalities of gait and mobility: Secondary | ICD-10-CM

## 2019-02-27 NOTE — Therapy (Signed)
[facility] [address] [place], [place], [address] Phone: [phone]   Fax:  [phone]  Physical Therapy Treatment  Patient Details  Name: Brett [name] MRN: [id number] Date of Birth: 18-Oct-1960 Referring Provider (PT): Gershon Mussel, MD   Encounter Date: 02/27/2019  PT End of Session - 02/27/19 1356    Visit Number  3    Number of Visits  18    Date for PT Re-Evaluation  03/27/19    PT Start Time  1324   pt arrived late   PT Stop Time  1402    PT Time Calculation (min)  38 min    Activity Tolerance  Patient tolerated treatment well    Behavior During Therapy  [facility] for tasks assessed/performed       Past Medical History:  Diagnosis Date  . Allergy    albuterol as needed  . Anal fissure   . Arthritis   . Colon polyps 01/13/11   Colonoscopy  . GERD (gastroesophageal reflux disease)   . Pneumonia   . Pre-diabetes     Past Surgical History:  Procedure Laterality Date  . ANAL FISSURE REPAIR  02/24/11  . CHONDROPLASTY  08/14/2018   Procedure: CHONDROPLASTY;  Surgeon: Tarry Kos, MD;  Location: [facility];  Service: Orthopedics;;  . COLONOSCOPY    . KNEE ARTHROSCOPY WITH MEDIAL MENISECTOMY Left 08/14/2018   Procedure: LEFT KNEE ARTHROSCOPY WITH PARTIAL MEDIAL MENISCECTOMY;  Surgeon: Tarry Kos, MD;  Location: [facility];  Service: Orthopedics;  Laterality: Left;  . KNEE ARTHROSCOPY WITH MEDIAL MENISECTOMY Right 10/18/2018   Procedure: RIGHT KNEE ARTHROSCOPY WITH PARTIAL MEDIAL MENISCECTOMY;  Surgeon: Tarry Kos, MD;  Location: [facility];  Service: Orthopedics;  Laterality: Right;  . TOTAL KNEE ARTHROPLASTY Left 01/24/2019   Procedure: LEFT TOTAL KNEE ARTHROPLASTY;  Surgeon: Tarry Kos, MD;  Location: [facility];  Service: Orthopedics;  Laterality: Left;    There were no vitals filed for this visit.  Subjective Assessment - 02/27/19 1326    Subjective  pain is still elevated;  rated a "high 8"    Pertinent History  OA    Limitations  Standing;Walking;Sitting    Patient Stated Goals  return to work    Currently in Pain?  Yes    Pain Location  Knee    Pain Orientation  Left    Pain Descriptors / Indicators  Aching;Tightness    Pain Type  Acute pain;Surgical pain    Pain Onset  1 to 4 weeks ago    Pain Frequency  Constant    Aggravating Factors   bending knee, sleeping, standing/walking for extended periods    Pain Relieving Factors  meds, ice/heat, repositioning                       [facility] Adult PT Treatment/Exercise - 02/27/19 1330      Knee/Hip Exercises: Stretches   Passive Hamstring Stretch  Left;3 reps;30 seconds    Passive Hamstring Stretch Limitations  supine with strap with overpressure at distal thigh      Knee/Hip Exercises: Aerobic   Recumbent Bike  partial revolutions x 8 min; seat 4      Knee/Hip Exercises: Supine   Quad Sets  Strengthening;20 reps;Left    Short Arc Quad Sets  Strengthening;Left;20 reps   1#   Heel Slides  AAROM;Left;20 reps    Heel Slides Limitations  supine with strap  Modalities   Modalities  Vasopneumatic      Vasopneumatic   Number Minutes Vasopneumatic   10 minutes    Vasopnuematic Location   Knee    Vasopneumatic Pressure  Medium    Vasopneumatic Temperature   34                  PT Long Term Goals - 02/18/19 1155      PT LONG TERM GOAL #1   Title  independent with HEP    Status  On-going      PT LONG TERM GOAL #2   Title  improve Lt knee AROM 0-110 for improved function    Status  On-going      PT LONG TERM GOAL #3   Title  amb without AD without gait deviations for improved function and mobility    Status  On-going      PT LONG TERM GOAL #4   Title  report pain < 3/10 with ambulation for improved function    Status  On-going            Plan - 02/27/19 1357    Clinical Impression Statement  Pt continues with elevated pain levels but overall ROM is doing  well measuring 108 deg flextion today.  Overall progressing well with PT despite high levels of pain.  Will continue to benefit from PT to maximize function.    Comorbidities  OA    Examination-Activity Limitations  Locomotion Level;Stairs;Stand;Hygiene/Grooming;Transfers;Squat;Sit    Examination-Participation Restrictions  Other    Stability/Clinical Decision Making  Stable/Uncomplicated    Rehab Potential  Good    PT Frequency  3x / week    PT Duration  6 weeks    PT Treatment/Interventions  ADLs/Self Care Home Management;Cryotherapy;Electrical Stimulation;Iontophoresis 4mg /ml Dexamethasone;Moist Heat;Balance training;Therapeutic exercise;Therapeutic activities;Functional mobility training;Stair training;Gait training;DME Instruction;Neuromuscular re-education;Patient/family education;Manual techniques;Vasopneumatic Device;Taping;Dry needling;Passive range of motion    PT Next Visit Plan  review HEP, continue with ROM as able, manual/modalities PRN    PT Home Exercise Plan  Access Code: [email or web address]    Consulted and Agree with Plan of Care  Patient       Patient will benefit from skilled therapeutic intervention in order to improve the following deficits and impairments:  Abnormal gait, Pain, Decreased strength, Difficulty walking, Decreased mobility, Impaired flexibility, Decreased range of motion, Increased edema  Visit Diagnosis: Acute pain of left knee  Stiffness of left knee, not elsewhere classified  Other abnormalities of gait and mobility  Muscle weakness (generalized)  Localized edema     Problem List Patient Active Problem List   Diagnosis Date Noted  . Status post total left knee replacement 01/24/2019  . Primary osteoarthritis of left knee 12/17/2018  . Primary osteoarthritis of right knee 12/17/2018  . Complex tear of medial meniscus of right knee as current injury 08/06/2018  . Overweight (BMI 25.0-29.9) 08/08/2017  . Medication management 03/23/2014  . Vitamin D  deficiency 03/23/2014  . Abnormal glucose 03/23/2014  . Hyperlipemia 10/31/2006  . GERD 10/31/2006      Laureen Abrahams, PT, DPT 02/27/19 1:59 PM     [facility] [address] [place], [place], [address] Phone: [phone]   Fax:  [phone]  Name: June Vacha MRN: [id number] Date of Birth: 06-Feb-1960

## 2019-02-28 ENCOUNTER — Ambulatory Visit (INDEPENDENT_AMBULATORY_CARE_PROVIDER_SITE_OTHER): Payer: BC Managed Care – PPO | Admitting: Physical Therapy

## 2019-02-28 ENCOUNTER — Encounter: Payer: Self-pay | Admitting: Physical Therapy

## 2019-02-28 DIAGNOSIS — M25562 Pain in left knee: Secondary | ICD-10-CM | POA: Diagnosis not present

## 2019-02-28 DIAGNOSIS — R2689 Other abnormalities of gait and mobility: Secondary | ICD-10-CM

## 2019-02-28 DIAGNOSIS — M6281 Muscle weakness (generalized): Secondary | ICD-10-CM | POA: Diagnosis not present

## 2019-02-28 DIAGNOSIS — M25662 Stiffness of left knee, not elsewhere classified: Secondary | ICD-10-CM | POA: Diagnosis not present

## 2019-02-28 NOTE — Therapy (Signed)
[facility] Physical Therapy [address] [place], [place], [address] Phone: [phone]   Fax:  [phone]  Physical Therapy Treatment  Patient Details  Name: Brett [name] MRN: [id number] Date of Birth: 1960-10-18 Referring Provider (PT): Frankey Shown, MD   Encounter Date: 02/28/2019  PT End of Session - 02/28/19 1512    Visit Number  4    Number of Visits  18    Date for PT Re-Evaluation  03/27/19    PT Start Time  3235    PT Stop Time  1452    PT Time Calculation (min)  49 min    Activity Tolerance  Patient tolerated treatment well    Behavior During Therapy  [facility] for tasks assessed/performed       Past Medical History:  Diagnosis Date  . Allergy    albuterol as needed  . Anal fissure   . Arthritis   . Colon polyps 01/13/11   Colonoscopy  . GERD (gastroesophageal reflux disease)   . Pneumonia   . Pre-diabetes     Past Surgical History:  Procedure Laterality Date  . ANAL FISSURE REPAIR  02/24/11  . CHONDROPLASTY  08/14/2018   Procedure: CHONDROPLASTY;  Surgeon: Leandrew Koyanagi, MD;  Location: [place];  Service: Orthopedics;;  . COLONOSCOPY    . KNEE ARTHROSCOPY WITH MEDIAL MENISECTOMY Left 08/14/2018   Procedure: LEFT KNEE ARTHROSCOPY WITH PARTIAL MEDIAL MENISCECTOMY;  Surgeon: Leandrew Koyanagi, MD;  Location: [place];  Service: Orthopedics;  Laterality: Left;  . KNEE ARTHROSCOPY WITH MEDIAL MENISECTOMY Right 10/18/2018   Procedure: RIGHT KNEE ARTHROSCOPY WITH PARTIAL MEDIAL MENISCECTOMY;  Surgeon: Leandrew Koyanagi, MD;  Location: [place];  Service: Orthopedics;  Laterality: Right;  . TOTAL KNEE ARTHROPLASTY Left 01/24/2019   Procedure: LEFT TOTAL KNEE ARTHROPLASTY;  Surgeon: Leandrew Koyanagi, MD;  Location: [place];  Service: Orthopedics;  Laterality: Left;    There were no vitals filed for this visit.  Subjective Assessment - 02/28/19 1409    Subjective  " I wasn't able to get my pain meds yesterday  so the pain is a high 8-9/10"    Currently in Pain?  Yes    Pain Score  9     Pain Location  Knee    Pain Orientation  Left    Pain Descriptors / Indicators  Aching    Pain Type  Surgical pain    Pain Frequency  Constant         [facility] PT Assessment - 02/28/19 0001      Assessment   Medical Diagnosis  Left TKA    Referring Provider (PT)  Frankey Shown, MD    Onset Date/Surgical Date  01/24/19      AROM   Left Knee Flexion  90                   [facility] Adult PT Treatment/Exercise - 02/28/19 0001      Knee/Hip Exercises: Stretches   Passive Hamstring Stretch  2 reps;30 seconds    Quad Stretch  2 reps;30 seconds      Knee/Hip Exercises: Aerobic   Recumbent Bike  partial revolutions x 5 min; seat 4    cues to avoid keeping ankle sitff  to promote range     Knee/Hip Exercises: Standing   Gait Training  exaggerated heel strike/ toe off with shorter stride bil with RW 2 x 40 ft    demonstration and cues for proper form  Vasopneumatic   Number Minutes Vasopneumatic   10 minutes    Vasopnuematic Location   Knee    Vasopneumatic Pressure  Medium    Vasopneumatic Temperature   34      Manual Therapy   Manual Therapy  Joint mobilization    Joint Mobilization  grade III AP grade in supine with pt activley flexing knee between bouts to max available ROME    Passive ROM  patella mobs, knee flexion with ossicillations and knee extension with overpressure in sitting with R contralateral extension for reciprocal inhibition             PT Education - 02/28/19 1546    Education Details  Reviewed POC and goals with pt that PT is to promote not only ROM but strength and overal function.    Person(s) Educated  Patient    Methods  Explanation;Verbal cues    Comprehension  Verbalized understanding;Verbal cues required          PT Long Term Goals - 02/18/19 1155      PT LONG TERM GOAL #1   Title  independent with HEP    Status  On-going      PT LONG TERM GOAL  #2   Title  improve Lt knee AROM 0-110 for improved function    Status  On-going      PT LONG TERM GOAL #3   Title  amb without AD without gait deviations for improved function and mobility    Status  On-going      PT LONG TERM GOAL #4   Title  report pain < 3/10 with ambulation for improved function    Status  On-going            Plan - 02/28/19 1515    Clinical Impression Statement  pt continues to report elevated pain noting it is secondary to lack of pain medication. Actively he was able to get 90 degrees of flexion. continued working on ROM using bike which he continues to only be able to perofrm 1/2 revolutions, and mobilizations. pt noted increased pain in the R knee which could be likely from compensation, worked on Field seismologist exaggerated heel strike/ toe off with RW, which he reported decreased pain. continued vaso end of session to calm down pain and inflammation.    PT Treatment/Interventions  ADLs/Self Care Home Management;Cryotherapy;Electrical Stimulation;Iontophoresis 4mg /ml Dexamethasone;Moist Heat;Balance training;Therapeutic exercise;Therapeutic activities;Functional mobility training;Stair training;Gait training;DME Instruction;Neuromuscular re-education;Patient/family education;Manual techniques;Vasopneumatic Device;Taping;Dry needling;Passive range of motion    PT Next Visit Plan  review HEP, continue with ROM as able, manual/modalities PRN    PT Home Exercise Plan  Access Code: [email or web address]       Patient will benefit from skilled therapeutic intervention in order to improve the following deficits and impairments:  Abnormal gait, Pain, Decreased strength, Difficulty walking, Decreased mobility, Impaired flexibility, Decreased range of motion, Increased edema  Visit Diagnosis: Acute pain of left knee  Stiffness of left knee, not elsewhere classified  Other abnormalities of gait and mobility  Muscle weakness (generalized)     Problem List Patient  Active Problem List   Diagnosis Date Noted  . Status post total left knee replacement 01/24/2019  . Primary osteoarthritis of left knee 12/17/2018  . Primary osteoarthritis of right knee 12/17/2018  . Complex tear of medial meniscus of right knee as current injury 08/06/2018  . Overweight (BMI 25.0-29.9) 08/08/2017  . Medication management 03/23/2014  . Vitamin D deficiency 03/23/2014  . Abnormal glucose 03/23/2014  .  Hyperlipemia 10/31/2006  . GERD 10/31/2006   Lulu Riding PT, DPT, LAT, ATC  02/28/19  3:49 PM      [facility] Physical Therapy [address] [place], [place], [address] Phone: [phone]   Fax:  [phone]  Name: Kamon Fahr MRN: [id number] Date of Birth: [birth year]

## 2019-03-03 ENCOUNTER — Other Ambulatory Visit: Payer: Self-pay

## 2019-03-03 ENCOUNTER — Ambulatory Visit (INDEPENDENT_AMBULATORY_CARE_PROVIDER_SITE_OTHER): Payer: BC Managed Care – PPO | Admitting: Rehabilitative and Restorative Service Providers"

## 2019-03-03 DIAGNOSIS — R600 Localized edema: Secondary | ICD-10-CM

## 2019-03-03 DIAGNOSIS — R2689 Other abnormalities of gait and mobility: Secondary | ICD-10-CM | POA: Diagnosis not present

## 2019-03-03 DIAGNOSIS — M6281 Muscle weakness (generalized): Secondary | ICD-10-CM

## 2019-03-03 DIAGNOSIS — M25662 Stiffness of left knee, not elsewhere classified: Secondary | ICD-10-CM | POA: Diagnosis not present

## 2019-03-03 DIAGNOSIS — M25562 Pain in left knee: Secondary | ICD-10-CM | POA: Diagnosis not present

## 2019-03-03 NOTE — Therapy (Signed)
[facility] Physical Therapy [address] [place], [place], [address] Phone: [phone]   Fax:  [phone]  Physical Therapy Treatment  Patient Details  Name: Brett [name] MRN: [id number] Date of Birth: 1960/04/04 Referring Provider (PT): Frankey Shown, MD   Encounter Date: 03/03/2019  PT End of Session - 03/03/19 1213    Visit Number  5    Number of Visits  18    Date for PT Re-Evaluation  03/27/19    PT Start Time  7096    PT Stop Time  1235    PT Time Calculation (min)  50 min    Activity Tolerance  Patient tolerated treatment well    Behavior During Therapy  [facility] for tasks assessed/performed       Past Medical History:  Diagnosis Date  . Allergy    albuterol as needed  . Anal fissure   . Arthritis   . Colon polyps 01/13/11   Colonoscopy  . GERD (gastroesophageal reflux disease)   . Pneumonia   . Pre-diabetes     Past Surgical History:  Procedure Laterality Date  . ANAL FISSURE REPAIR  02/24/11  . CHONDROPLASTY  08/14/2018   Procedure: CHONDROPLASTY;  Surgeon: Leandrew Koyanagi, MD;  Location: [place];  Service: Orthopedics;;  . COLONOSCOPY    . KNEE ARTHROSCOPY WITH MEDIAL MENISECTOMY Left 08/14/2018   Procedure: LEFT KNEE ARTHROSCOPY WITH PARTIAL MEDIAL MENISCECTOMY;  Surgeon: Leandrew Koyanagi, MD;  Location: [place];  Service: Orthopedics;  Laterality: Left;  . KNEE ARTHROSCOPY WITH MEDIAL MENISECTOMY Right 10/18/2018   Procedure: RIGHT KNEE ARTHROSCOPY WITH PARTIAL MEDIAL MENISCECTOMY;  Surgeon: Leandrew Koyanagi, MD;  Location: [place];  Service: Orthopedics;  Laterality: Right;  . TOTAL KNEE ARTHROPLASTY Left 01/24/2019   Procedure: LEFT TOTAL KNEE ARTHROPLASTY;  Surgeon: Leandrew Koyanagi, MD;  Location: [place];  Service: Orthopedics;  Laterality: Left;    There were no vitals filed for this visit.  Subjective Assessment - 03/03/19 1210    Subjective  Pt. indicated 8/10 pain, constant.  Pt. stated  trying to move it more but it's hard.  Still has complaints of buckling    Currently in Pain?  Yes    Pain Score  8     Pain Location  Knee    Pain Orientation  Left    Pain Descriptors / Indicators  Aching;Throbbing    Pain Type  Surgical pain                       [facility] Adult PT Treatment/Exercise - 03/03/19 0001      Knee/Hip Exercises: Aerobic   Recumbent Bike  partial revolutions x 6 min; seat 3      Knee/Hip Exercises: Standing   Other Standing Knee Exercises  standing weight shift to Lt LE 5 seconds on 2 mins total at counter      Knee/Hip Exercises: Seated   Long Arc Quad  AROM;Strengthening;Left;2 sets;10 reps      Knee/Hip Exercises: Supine   Other Supine Knee/Hip Exercises  supine LAQ in 90 deg hip flexion 2 x 10      Vasopneumatic   Number Minutes Vasopneumatic   10 minutes    Vasopnuematic Location   Knee    Vasopneumatic Pressure  Medium    Vasopneumatic Temperature   34      Manual Therapy   Manual Therapy  Muscle Energy Technique    Joint Mobilization  G3  ap tibia joint mobs    Muscle Energy Technique  Contract/relax for L knee flexion             PT Education - 03/03/19 1211    Education Details  Cues for intervention.    Person(s) Educated  Patient    Methods  Explanation;Verbal cues    Comprehension  Verbalized understanding;Returned demonstration          PT Long Term Goals - 02/18/19 1155      PT LONG TERM GOAL #1   Title  independent with HEP    Status  On-going      PT LONG TERM GOAL #2   Title  improve Lt knee AROM 0-110 for improved function    Status  On-going      PT LONG TERM GOAL #3   Title  amb without AD without gait deviations for improved function and mobility    Status  On-going      PT LONG TERM GOAL #4   Title  report pain < 3/10 with ambulation for improved function    Status  On-going            Plan - 03/03/19 1212    Clinical Impression Statement  Guarding in mobility present but  able to progress past 90 degrees flexion with MET techniques.  Quad strength lacking and -15/20 TKE noted in SLR.    PT Treatment/Interventions  ADLs/Self Care Home Management;Cryotherapy;Electrical Stimulation;Iontophoresis 71m/ml Dexamethasone;Moist Heat;Balance training;Therapeutic exercise;Therapeutic activities;Functional mobility training;Stair training;Gait training;DME Instruction;Neuromuscular re-education;Patient/family education;Manual techniques;Vasopneumatic Device;Taping;Dry needling;Passive range of motion    PT Next Visit Plan  Quad strengthening, TKE improvement for ambulation.    PT Home Exercise Plan  Access Code: [email or web address]      Patient will benefit from skilled therapeutic intervention in order to improve the following deficits and impairments:  Abnormal gait, Pain, Decreased strength, Difficulty walking, Decreased mobility, Impaired flexibility, Decreased range of motion, Increased edema  Visit Diagnosis: Acute pain of left knee  Stiffness of left knee, not elsewhere classified  Other abnormalities of gait and mobility  Muscle weakness (generalized)  Localized edema     Problem List Patient Active Problem List   Diagnosis Date Noted  . Status post total left knee replacement 01/24/2019  . Primary osteoarthritis of left knee 12/17/2018  . Primary osteoarthritis of right knee 12/17/2018  . Complex tear of medial meniscus of right knee as [name] injury 08/06/2018  . Overweight (BMI 25.0-29.9) 08/08/2017  . Medication management 03/23/2014  . Vitamin D deficiency 03/23/2014  . Abnormal glucose 03/23/2014  . Hyperlipemia 10/31/2006  . GERD 10/31/2006   MScot Jun PT, DPT, OCS, ATC 03/03/19  12:40 PM     [facility] [address] [address]Phone: [phone]  Fax:  [phone] Name: Brett LoganMRN: [id number] of Birth: 1Mar 16, 1962

## 2019-03-05 ENCOUNTER — Other Ambulatory Visit: Payer: Self-pay

## 2019-03-05 ENCOUNTER — Ambulatory Visit (INDEPENDENT_AMBULATORY_CARE_PROVIDER_SITE_OTHER): Payer: BC Managed Care – PPO | Admitting: Rehabilitative and Restorative Service Providers"

## 2019-03-05 ENCOUNTER — Encounter: Payer: Self-pay | Admitting: Rehabilitative and Restorative Service Providers"

## 2019-03-05 DIAGNOSIS — M25562 Pain in left knee: Secondary | ICD-10-CM

## 2019-03-05 DIAGNOSIS — M6281 Muscle weakness (generalized): Secondary | ICD-10-CM

## 2019-03-05 DIAGNOSIS — M25662 Stiffness of left knee, not elsewhere classified: Secondary | ICD-10-CM

## 2019-03-05 DIAGNOSIS — R2689 Other abnormalities of gait and mobility: Secondary | ICD-10-CM | POA: Diagnosis not present

## 2019-03-05 DIAGNOSIS — R600 Localized edema: Secondary | ICD-10-CM

## 2019-03-05 NOTE — Therapy (Signed)
[facility] Physical Therapy [address] [place], [place], [address] Phone: [phone]   Fax:  [phone]  Physical Therapy Treatment  Patient Details  Name: Brett [name] MRN: [id number] Date of Birth: Mar 25, 1960 Referring Provider (PT): Frankey Shown, MD   Encounter Date: 03/05/2019  PT End of Session - 03/05/19 1208    Visit Number  6    Number of Visits  18    Date for PT Re-Evaluation  03/27/19    PT Start Time  1093    PT Stop Time  1235    PT Time Calculation (min)  50 min    Activity Tolerance  Patient tolerated treatment well    Behavior During Therapy  [facility] for tasks assessed/performed       Past Medical History:  Diagnosis Date  . Allergy    albuterol as needed  . Anal fissure   . Arthritis   . Colon polyps 01/13/11   Colonoscopy  . GERD (gastroesophageal reflux disease)   . Pneumonia   . Pre-diabetes     Past Surgical History:  Procedure Laterality Date  . ANAL FISSURE REPAIR  02/24/11  . CHONDROPLASTY  08/14/2018   Procedure: CHONDROPLASTY;  Surgeon: Leandrew Koyanagi, MD;  Location: [place];  Service: Orthopedics;;  . COLONOSCOPY    . KNEE ARTHROSCOPY WITH MEDIAL MENISECTOMY Left 08/14/2018   Procedure: LEFT KNEE ARTHROSCOPY WITH PARTIAL MEDIAL MENISCECTOMY;  Surgeon: Leandrew Koyanagi, MD;  Location: [place];  Service: Orthopedics;  Laterality: Left;  . KNEE ARTHROSCOPY WITH MEDIAL MENISECTOMY Right 10/18/2018   Procedure: RIGHT KNEE ARTHROSCOPY WITH PARTIAL MEDIAL MENISCECTOMY;  Surgeon: Leandrew Koyanagi, MD;  Location: [place];  Service: Orthopedics;  Laterality: Right;  . TOTAL KNEE ARTHROPLASTY Left 01/24/2019   Procedure: LEFT TOTAL KNEE ARTHROPLASTY;  Surgeon: Leandrew Koyanagi, MD;  Location: [place];  Service: Orthopedics;  Laterality: Left;    There were no vitals filed for this visit.  Subjective Assessment - 03/05/19 1204    Subjective  Pt. reported continued complaints at night  which make sleeping very tough.  Pt. stated moderate pain upon arrival today.    Currently in Pain?  Yes    Pain Score  5     Pain Location  Knee    Pain Orientation  Left    Pain Descriptors / Indicators  Aching;Throbbing    Pain Type  Surgical pain    Pain Onset  1 to 4 weeks ago    Pain Frequency  Constant                       [facility] Adult PT Treatment/Exercise - 03/05/19 0001      Knee/Hip Exercises: Aerobic   Nustep  --      Knee/Hip Exercises: Standing   Other Standing Knee Exercises  retro step x 15 on Lt LE.  Ambulation fwd/rev c Rt UE on bar 10 ft x 4 each way      Knee/Hip Exercises: Seated   Long Arc Quad  Strengthening;AROM;Left;3 sets;10 reps    Long Arc Quad Weight  2 lbs.   for x 10 reps     Vasopneumatic   Number Minutes Vasopneumatic   10 minutes    Vasopnuematic Location   Knee    Vasopneumatic Pressure  Medium      Manual Therapy   Joint Mobilization  G3 ap tibia joint mobs    Muscle Energy Technique  Contract/relax  for L knee flexion             PT Education - 03/05/19 1205    Education Details  Cues for intervention techniques.  Education on post surgical pain presentations.    Person(s) Educated  Patient    Methods  Explanation;Demonstration;Verbal cues    Comprehension  Verbalized understanding;Returned demonstration          PT Long Term Goals - 02/18/19 1155      PT LONG TERM GOAL #1   Title  independent with HEP    Status  On-going      PT LONG TERM GOAL #2   Title  improve Lt knee AROM 0-110 for improved function    Status  On-going      PT LONG TERM GOAL #3   Title  amb without AD without gait deviations for improved function and mobility    Status  On-going      PT LONG TERM GOAL #4   Title  report pain < 3/10 with ambulation for improved function    Status  On-going            Plan - 03/05/19 1205    Clinical Impression Statement  Pt. continues to report and experience higher  severity/irritability of Lt knee pain post surgical at this time.  Pt. has demonstrated mild improvements in quad activation but TKE lacking continues in SLR, LAQ.  AROM to 90/95 today c minimal pain increase.    PT Treatment/Interventions  ADLs/Self Care Home Management;Cryotherapy;Electrical Stimulation;Iontophoresis 4mg /ml Dexamethasone;Moist Heat;Balance training;Therapeutic exercise;Therapeutic activities;Functional mobility training;Stair training;Gait training;DME Instruction;Neuromuscular re-education;Patient/family education;Manual techniques;Vasopneumatic Device;Taping;Dry needling;Passive range of motion    PT Next Visit Plan  Continued strengthening, progress WB as appropriate for transition from walker.    PT Home Exercise Plan  Access Code: [email or web address]    Consulted and Agree with Plan of Care  Patient       Patient will benefit from skilled therapeutic intervention in order to improve the following deficits and impairments:  Abnormal gait, Pain, Decreased strength, Difficulty walking, Decreased mobility, Impaired flexibility, Decreased range of motion, Increased edema  Visit Diagnosis: Acute pain of left knee  Stiffness of left knee, not elsewhere classified  Other abnormalities of gait and mobility  Muscle weakness (generalized)  Localized edema     Problem List Patient Active Problem List   Diagnosis Date Noted  . Status post total left knee replacement 01/24/2019  . Primary osteoarthritis of left knee 12/17/2018  . Primary osteoarthritis of right knee 12/17/2018  . Complex tear of medial meniscus of right knee as current injury 08/06/2018  . Overweight (BMI 25.0-29.9) 08/08/2017  . Medication management 03/23/2014  . Vitamin D deficiency 03/23/2014  . Abnormal glucose 03/23/2014  . Hyperlipemia 10/31/2006  . GERD 10/31/2006   11/02/2006, PT, DPT, OCS, ATC 03/05/19  12:30 PM     [facility] Physical Therapy [address] [place], [place],  [place] Phone: [phone]   Fax:  [phone]  Name: Brett [name] MRN: Abbe Amsterdam Date of Birth: 07/16/1960

## 2019-03-07 ENCOUNTER — Encounter: Payer: Self-pay | Admitting: Rehabilitative and Restorative Service Providers"

## 2019-03-07 ENCOUNTER — Ambulatory Visit (INDEPENDENT_AMBULATORY_CARE_PROVIDER_SITE_OTHER): Payer: BC Managed Care – PPO | Admitting: Rehabilitative and Restorative Service Providers"

## 2019-03-07 ENCOUNTER — Other Ambulatory Visit: Payer: Self-pay

## 2019-03-07 DIAGNOSIS — M25562 Pain in left knee: Secondary | ICD-10-CM | POA: Diagnosis not present

## 2019-03-07 DIAGNOSIS — M25662 Stiffness of left knee, not elsewhere classified: Secondary | ICD-10-CM

## 2019-03-07 DIAGNOSIS — R2689 Other abnormalities of gait and mobility: Secondary | ICD-10-CM | POA: Diagnosis not present

## 2019-03-07 DIAGNOSIS — R600 Localized edema: Secondary | ICD-10-CM

## 2019-03-07 DIAGNOSIS — M6281 Muscle weakness (generalized): Secondary | ICD-10-CM

## 2019-03-07 NOTE — Therapy (Signed)
[facility] Physical Therapy [address] [place], [place], [address] Phone: [phone]   Fax:  [phone]  Physical Therapy Treatment  Patient Details  Name: Brett [name] MRN: [id number] Date of Birth: 03/23/1960 Referring Provider (PT): Gershon Mussel, MD   Encounter Date: 03/07/2019  PT End of Session - 03/07/19 1223    Visit Number  7    Number of Visits  18    Date for PT Re-Evaluation  03/27/19    PT Start Time  1152    PT Stop Time  1242    PT Time Calculation (min)  50 min    Activity Tolerance  Patient tolerated treatment well    Behavior During Therapy  [facility] for tasks assessed/performed       Past Medical History:  Diagnosis Date  . Allergy    albuterol as needed  . Anal fissure   . Arthritis   . Colon polyps 01/13/11   Colonoscopy  . GERD (gastroesophageal reflux disease)   . Pneumonia   . Pre-diabetes     Past Surgical History:  Procedure Laterality Date  . ANAL FISSURE REPAIR  02/24/11  . CHONDROPLASTY  08/14/2018   Procedure: CHONDROPLASTY;  Surgeon: Tarry Kos, MD;  Location: [facility];  Service: Orthopedics;;  . COLONOSCOPY    . KNEE ARTHROSCOPY WITH MEDIAL MENISECTOMY Left 08/14/2018   Procedure: LEFT KNEE ARTHROSCOPY WITH PARTIAL MEDIAL MENISCECTOMY;  Surgeon: Tarry Kos, MD;  Location: [facility];  Service: Orthopedics;  Laterality: Left;  . KNEE ARTHROSCOPY WITH MEDIAL MENISECTOMY Right 10/18/2018   Procedure: RIGHT KNEE ARTHROSCOPY WITH PARTIAL MEDIAL MENISCECTOMY;  Surgeon: Tarry Kos, MD;  Location: [facility];  Service: Orthopedics;  Laterality: Right;  . TOTAL KNEE ARTHROPLASTY Left 01/24/2019   Procedure: LEFT TOTAL KNEE ARTHROPLASTY;  Surgeon: Tarry Kos, MD;  [facility];  Service: Orthopedics;  Laterality: Left;    There were no vitals filed for this visit.  Subjective Assessment - 03/07/19 1203    Subjective  Pt. indicated continued pain complaints.   Reported 8/10 or so.    Currently in Pain?  Yes    Pain Score  8     Pain Location  Knee    Pain Orientation  Left    Pain Onset  1 to 4 weeks ago                       [facility] Adult PT Treatment/Exercise - 03/07/19 0001      Knee/Hip Exercises: Aerobic   Recumbent Bike  partial revolutions 10 mins       Knee/Hip Exercises: Standing   Other Standing Knee Exercises  retro step x 15 on Lt LE.  Ambulation fwd/rev c Rt UE on bar 10 ft x 4 each way      Knee/Hip Exercises: Seated   Long Arc Quad  Strengthening;AROM;Left;3 sets;10 reps    Long Arc Quad Weight  3 lbs.      Knee/Hip Exercises: Supine   Quad Sets  Strengthening;Left;Other (comment)   c NMES 5 seconds on /off 4 mins   Terminal Knee Extension  Strengthening;Other (comment)   c NMES, SLR 5 second on /off 3 mins     Modalities   Modalities  Engineer, technical sales Action  NMES    Electrical Stimulation Parameters  to muscle contraction  Electrical Stimulation Goals  Strength      Vasopneumatic   Number Minutes Vasopneumatic   10 minutes    Vasopnuematic Location   Knee    Vasopneumatic Pressure  Medium    Vasopneumatic Temperature   34      Manual Therapy   Joint Mobilization  G3 ap tibia joint mobs    Muscle Energy Technique  Contract/relax for L knee flexion             PT Education - 03/07/19 1208    Education Details  Education on NMES.  Cues for intervention.    Person(s) Educated  Patient    Methods  Explanation;Demonstration    Comprehension  Verbalized understanding;Returned demonstration          PT Long Term Goals - 02/18/19 1155      PT LONG TERM GOAL #1   Title  independent with HEP    Status  On-going      PT LONG TERM GOAL #2   Title  improve Lt knee AROM 0-110 for improved function    Status  On-going      PT LONG TERM GOAL #3   Title  amb without AD without gait  deviations for improved function and mobility    Status  On-going      PT LONG TERM GOAL #4   Title  report pain < 3/10 with ambulation for improved function    Status  On-going            Plan - 03/07/19 1236    Clinical Impression Statement  Noted improvement in activation for quad and TKE today c use of NMES application.  Continued deficits noted but progress shown today    PT Treatment/Interventions  ADLs/Self Care Home Management;Cryotherapy;Electrical Stimulation;Iontophoresis 4mg /ml Dexamethasone;Moist Heat;Balance training;Therapeutic exercise;Therapeutic activities;Functional mobility training;Stair training;Gait training;DME Instruction;Neuromuscular re-education;Patient/family education;Manual techniques;Vasopneumatic Device;Taping;Dry needling;Passive range of motion    PT Next Visit Plan  NMES c quad strengthening.    PT Home Exercise Plan  Access Code: [email or web address]    Consulted and Agree with Plan of Care  Patient       Patient will benefit from skilled therapeutic intervention in order to improve the following deficits and impairments:  Abnormal gait, Pain, Decreased strength, Difficulty walking, Decreased mobility, Impaired flexibility, Decreased range of motion, Increased edema  Visit Diagnosis: Acute pain of left knee  Stiffness of left knee, not elsewhere classified  Other abnormalities of gait and mobility  Muscle weakness (generalized)  Localized edema     Problem List Patient Active Problem List   Diagnosis Date Noted  . Status post total left knee replacement 01/24/2019  . Primary osteoarthritis of left knee 12/17/2018  . Primary osteoarthritis of right knee 12/17/2018  . Complex tear of medial meniscus of right knee as current injury 08/06/2018  . Overweight (BMI 25.0-29.9) 08/08/2017  . Medication management 03/23/2014  . Vitamin D deficiency 03/23/2014  . Abnormal glucose 03/23/2014  . Hyperlipemia 10/31/2006  . GERD 10/31/2006   [name], PT, DPT, OCS, ATC 03/07/19  12:37 PM    [facility] [address] [place], [place], [address] Phone: [phone]   Fax:  [phone]  Name: Abimael Zeiter MRN: [id number] Date of Birth: 1960/07/08

## 2019-03-10 ENCOUNTER — Encounter: Payer: Self-pay | Admitting: Orthopaedic Surgery

## 2019-03-10 ENCOUNTER — Telehealth: Payer: Self-pay | Admitting: Orthopaedic Surgery

## 2019-03-10 ENCOUNTER — Encounter: Payer: Self-pay | Admitting: Rehabilitative and Restorative Service Providers"

## 2019-03-10 ENCOUNTER — Other Ambulatory Visit: Payer: Self-pay

## 2019-03-10 ENCOUNTER — Ambulatory Visit (INDEPENDENT_AMBULATORY_CARE_PROVIDER_SITE_OTHER): Payer: BC Managed Care – PPO | Admitting: Rehabilitative and Restorative Service Providers"

## 2019-03-10 ENCOUNTER — Other Ambulatory Visit: Payer: Self-pay | Admitting: Physician Assistant

## 2019-03-10 ENCOUNTER — Ambulatory Visit (INDEPENDENT_AMBULATORY_CARE_PROVIDER_SITE_OTHER): Payer: BC Managed Care – PPO | Admitting: Orthopaedic Surgery

## 2019-03-10 ENCOUNTER — Ambulatory Visit (INDEPENDENT_AMBULATORY_CARE_PROVIDER_SITE_OTHER): Payer: BC Managed Care – PPO

## 2019-03-10 VITALS — Ht 66.0 in | Wt 167.0 lb

## 2019-03-10 DIAGNOSIS — Z96652 Presence of left artificial knee joint: Secondary | ICD-10-CM

## 2019-03-10 DIAGNOSIS — M25562 Pain in left knee: Secondary | ICD-10-CM | POA: Diagnosis not present

## 2019-03-10 DIAGNOSIS — M6281 Muscle weakness (generalized): Secondary | ICD-10-CM | POA: Diagnosis not present

## 2019-03-10 DIAGNOSIS — M25662 Stiffness of left knee, not elsewhere classified: Secondary | ICD-10-CM | POA: Diagnosis not present

## 2019-03-10 DIAGNOSIS — R2689 Other abnormalities of gait and mobility: Secondary | ICD-10-CM | POA: Diagnosis not present

## 2019-03-10 DIAGNOSIS — R600 Localized edema: Secondary | ICD-10-CM

## 2019-03-10 MED ORDER — IBUPROFEN 800 MG PO TABS: 800.0000 mg | ORAL_TABLET | Freq: Three times a day (TID) | ORAL | 0 refills | Status: DC | PRN

## 2019-03-10 NOTE — Progress Notes (Signed)
Post-Op Visit Note   Patient: Brett [name]           Date of Birth: 19-Jan-1960           MRN: [id number] Visit Date: 03/10/2019 PCP: Lucky Cowboy, MD   Assessment & Plan:  Chief Complaint:  Chief Complaint  Patient presents with  . Left Knee - Follow-up    Left total knee arthroplasty DOS 01/24/2019   Visit Diagnoses:  1. S/P TKR (total knee replacement), left     Plan: Patient is a 59 year old gentleman comes in today 6 weeks out left total knee replacement 01/24/2019.  He is ambulating with a walker.  He has been going to outpatient physical therapy where he attends 3 days a week.  He has been taking Norco for pain.  He is also taking Robaxin.  His main issues today are quad strength and pain control.  No fevers or chills.  Examination of his left knee reveals range of motion from 0 to 100 degrees.  Stable valgus varus stress.  He is neurovascularly intact distally.  At this point, I have called in ibuprofen to help with pain control.  He will continue to work on outpatient physical therapy as well as home exercise program.  Dental prophylaxis reinforced follow-up with [place] in 6 weeks time for recheck.  Follow-Up Instructions: Return in about 6 weeks (around 04/21/2019).   Orders:  Orders Placed This Encounter  Procedures  . XR Knee 1-2 Views Left   Meds ordered this encounter  Medications  . ibuprofen (ADVIL) 800 MG tablet    Sig: Take 1 tablet (800 mg total) by mouth every 8 (eight) hours as needed.    Dispense:  90 tablet    Refill:  0    Imaging: Well seated prosthesis without complication  PMFS History: Patient Active Problem List   Diagnosis Date Noted  . Status post total left knee replacement 01/24/2019  . Primary osteoarthritis of left knee 12/17/2018  . Primary osteoarthritis of right knee 12/17/2018  . Complex tear of medial meniscus of right knee as current injury 08/06/2018  . Overweight (BMI 25.0-29.9) 08/08/2017  . Medication management  03/23/2014  . Vitamin D deficiency 03/23/2014  . Abnormal glucose 03/23/2014  . Hyperlipemia 10/31/2006  . GERD 10/31/2006   Past Medical History:  Diagnosis Date  . Allergy    albuterol as needed  . Anal fissure   . Arthritis   . Colon polyps 01/13/11   Colonoscopy  . GERD (gastroesophageal reflux disease)   . Pneumonia   . Pre-diabetes     Family History  Problem Relation Age of Onset  . Rheum arthritis Mother   . Allergies Daughter   . Allergies Sister   . Asthma Daughter     Past Surgical History:  Procedure Laterality Date  . ANAL FISSURE REPAIR  02/24/11  . CHONDROPLASTY  08/14/2018   Procedure: CHONDROPLASTY;  Surgeon: Tarry Kos, MD;  Location: [facility];  Service: Orthopedics;;  . COLONOSCOPY    . KNEE ARTHROSCOPY WITH MEDIAL MENISECTOMY Left 08/14/2018   Procedure: LEFT KNEE ARTHROSCOPY WITH PARTIAL MEDIAL MENISCECTOMY;  Surgeon: Tarry Kos, MD;  Location: [facility];  Service: Orthopedics;  Laterality: Left;  . KNEE ARTHROSCOPY WITH MEDIAL MENISECTOMY Right 10/18/2018   Procedure: RIGHT KNEE ARTHROSCOPY WITH PARTIAL MEDIAL MENISCECTOMY;  Surgeon: Tarry Kos, MD;  Location: [facility];  Service: Orthopedics;  Laterality: Right;  . TOTAL KNEE ARTHROPLASTY Left 01/24/2019  Procedure: LEFT TOTAL KNEE ARTHROPLASTY;  Surgeon: Leandrew Koyanagi, MD;  Location: [place];  Service: Orthopedics;  Laterality: Left;   Social History   Occupational History    Employer: [employer]  Tobacco Use  . Smoking status: Former Smoker    Packs/day: 0.00    Years: 27.00    Pack years: 0.00    Types: Cigarettes    Quit date: 08/20/2018    Years since quitting: 0.5  . Smokeless tobacco: Never Used  Substance and Sexual Activity  . Alcohol use: Yes    Comment: socially  . Drug use: No  . Sexual activity: Not on file

## 2019-03-10 NOTE — Therapy (Signed)
[facility] Physical Therapy [address] [place], [place], [address] Phone: [phone]   Fax:  [phone]  Physical Therapy Treatment  Patient Details  Name: Brett [name] MRN: [id number] Date of Birth: [birth year] Referring Provider (PT): Frankey Shown, MD   Encounter Date: 03/10/2019  PT End of Session - 03/10/19 1218    Visit Number  8    Number of Visits  18    Date for PT Re-Evaluation  03/27/19    PT Start Time  1143    PT Stop Time  1233    PT Time Calculation (min)  50 min    Activity Tolerance  Patient tolerated treatment well    Behavior During Therapy  [facility] for tasks assessed/performed       Past Medical History:  Diagnosis Date  . Allergy    albuterol as needed  . Anal fissure   . Arthritis   . Colon polyps 01/13/11   Colonoscopy  . GERD (gastroesophageal reflux disease)   . Pneumonia   . Pre-diabetes     Past Surgical History:  Procedure Laterality Date  . ANAL FISSURE REPAIR  02/24/11  . CHONDROPLASTY  08/14/2018   Procedure: CHONDROPLASTY;  Surgeon: Leandrew Koyanagi, MD;  Location: [place];  Service: Orthopedics;;  . COLONOSCOPY    . KNEE ARTHROSCOPY WITH MEDIAL MENISECTOMY Left 08/14/2018   Procedure: LEFT KNEE ARTHROSCOPY WITH PARTIAL MEDIAL MENISCECTOMY;  Surgeon: Leandrew Koyanagi, MD;  Location: [place];  Service: Orthopedics;  Laterality: Left;  . KNEE ARTHROSCOPY WITH MEDIAL MENISECTOMY Right 10/18/2018   Procedure: RIGHT KNEE ARTHROSCOPY WITH PARTIAL MEDIAL MENISCECTOMY;  Surgeon: Leandrew Koyanagi, MD;  Location: [place];  Service: Orthopedics;  Laterality: Right;  . TOTAL KNEE ARTHROPLASTY Left 01/24/2019   Procedure: LEFT TOTAL KNEE ARTHROPLASTY;  Surgeon: Leandrew Koyanagi, MD;  Location: [place];  Service: Orthopedics;  Laterality: Left;    There were no vitals filed for this visit.  Subjective Assessment - 03/10/19 1159    Subjective  Pt. indicated feeling pain continued and  weakness in leg c walking.  Pt. stated bending is harder in walking vs. in sitting.    Currently in Pain?  Yes    Pain Score  6     Pain Location  Knee    Pain Orientation  Left    Pain Descriptors / Indicators  Aching;Throbbing    Pain Type  Surgical pain    Pain Onset  1 to 4 weeks ago         [facility] PT Assessment - 03/10/19 0001      AROM   Left Knee Extension  -17   measured in LAQ   Left Knee Flexion  104   measured in supine     PROM   Left Knee Extension  0   in supine   Left Knee Flexion  106   in supine     Strength   Strength Assessment Site  Knee    Right/Left Knee  Left;Right    Left Knee Flexion  4/5    Left Knee Extension  2+/5   unable to perform full TKE against gravity                  [facility] Adult PT Treatment/Exercise - 03/10/19 0001      Ambulation/Gait   Gait Comments  FWW ambulation continued      Knee/Hip Exercises: Standing   Heel Raises  Both;20 reps  Other Standing Knee Exercises  fwd/rev ambulation in parallel bars c Rt UE 10 ft x 5 each way      Knee/Hip Exercises: Seated   Long Arc Quad  Strengthening;Left;Other (comment)   c NMES 5 sec on/off 3 mins   Long Arc Quad Weight  3 lbs.      Knee/Hip Exercises: Supine   Quad Sets  Strengthening;Left;Other (comment)   c NMES 5 sec on /off 2 mins   Heel Slides  AROM;Strengthening;Left;2 sets;10 reps   on exercise ball   Terminal Knee Extension  Strengthening;Other (comment)   c NMES SLR 5 sec on/off 3 mins   Bridges  Strengthening;Both;3 sets;10 reps      Programmer, applications Parameters  to muscle contraction tolerance    Electrical Stimulation Goals  Strength      Vasopneumatic   Number Minutes Vasopneumatic   10 minutes    Vasopnuematic Location   Knee    Vasopneumatic Pressure  Medium    Vasopneumatic Temperature   34      Manual Therapy   Muscle Energy  Technique  Contract/relax for L knee flexion             PT Education - 03/10/19 1200    Education Details  Intervention cues    Person(s) Educated  Patient    Methods  Explanation;Demonstration;Verbal cues    Comprehension  Verbalized understanding;Returned demonstration          PT Long Term Goals - 02/18/19 1155      PT LONG TERM GOAL #1   Title  independent with HEP    Status  On-going      PT LONG TERM GOAL #2   Title  improve Lt knee AROM 0-110 for improved function    Status  On-going      PT LONG TERM GOAL #3   Title  amb without AD without gait deviations for improved function and mobility    Status  On-going      PT LONG TERM GOAL #4   Title  report pain < 3/10 with ambulation for improved function    Status  On-going            Plan - 03/10/19 1200    Clinical Impression Statement  Pt. has continued to show Lt quad strength deficits and Lt knee mobility deficits that impairs daily activity including funcitonal activity including walking to this point.  Improvements have been noted but pain severity/irritability has been a limiting factor.    Stability/Clinical Decision Making  Stable/Uncomplicated    Clinical Decision Making  Low    Rehab Potential  Good    PT Frequency  2x / week    PT Duration  6 weeks    PT Treatment/Interventions  ADLs/Self Care Home Management;Cryotherapy;Electrical Stimulation;Iontophoresis 4mg /ml Dexamethasone;Moist Heat;Balance training;Therapeutic exercise;Therapeutic activities;Functional mobility training;Stair training;Gait training;DME Instruction;Neuromuscular re-education;Patient/family education;Manual techniques;Vasopneumatic Device;Taping;Dry needling;Passive range of motion    PT Next Visit Plan  Continued NMES use for quad strenght, standing activity as tolerated.    PT Home Exercise Plan  Access Code: [email or web address]    Consulted and Agree with Plan of Care  Patient       Patient will benefit from skilled  therapeutic intervention in order to improve the following deficits and impairments:  Abnormal gait, Pain, Decreased strength, Difficulty walking, Decreased mobility, Impaired flexibility, Decreased range of motion,  Increased edema  Visit Diagnosis: Acute pain of left knee  Stiffness of left knee, not elsewhere classified  Other abnormalities of gait and mobility  Muscle weakness (generalized)  Localized edema     Problem List Patient Active Problem List   Diagnosis Date Noted  . Status post total left knee replacement 01/24/2019  . Primary osteoarthritis of left knee 12/17/2018  . Primary osteoarthritis of right knee 12/17/2018  . Complex tear of medial meniscus of right knee as current injury 08/06/2018  . Overweight (BMI 25.0-29.9) 08/08/2017  . Medication management 03/23/2014  . Vitamin D deficiency 03/23/2014  . Abnormal glucose 03/23/2014  . Hyperlipemia 10/31/2006  . GERD 10/31/2006    Chyrel Masson, PT, DPT, OCS, ATC 03/10/19  12:29 PM    [facility] Physical Therapy [address] [place], [place], [address] Phone: [phone]   Fax:  [phone]  Name: Brett [name] MRN: [id number] Date of Birth: 1960/02/13

## 2019-03-10 NOTE — Telephone Encounter (Signed)
03/03/19 form refaxed to [facility] along with records. [phone]

## 2019-03-11 ENCOUNTER — Ambulatory Visit: Payer: BC Managed Care – PPO | Admitting: Orthopaedic Surgery

## 2019-03-12 ENCOUNTER — Encounter: Payer: Self-pay | Admitting: Rehabilitative and Restorative Service Providers"

## 2019-03-12 ENCOUNTER — Ambulatory Visit (INDEPENDENT_AMBULATORY_CARE_PROVIDER_SITE_OTHER): Payer: BC Managed Care – PPO | Admitting: Rehabilitative and Restorative Service Providers"

## 2019-03-12 ENCOUNTER — Other Ambulatory Visit: Payer: Self-pay

## 2019-03-12 DIAGNOSIS — R2689 Other abnormalities of gait and mobility: Secondary | ICD-10-CM

## 2019-03-12 DIAGNOSIS — M25662 Stiffness of left knee, not elsewhere classified: Secondary | ICD-10-CM

## 2019-03-12 DIAGNOSIS — M6281 Muscle weakness (generalized): Secondary | ICD-10-CM

## 2019-03-12 DIAGNOSIS — M25562 Pain in left knee: Secondary | ICD-10-CM

## 2019-03-12 DIAGNOSIS — R600 Localized edema: Secondary | ICD-10-CM

## 2019-03-12 NOTE — Therapy (Signed)
[facility] Physical Therapy [address] [place], [place], [address] Phone: [phone]   Fax:  [phone]  Physical Therapy Treatment  Patient Details  Name: Brett [name] MRN: [id number] Date of Birth: 29-Jan-1960 Referring Provider (PT): Frankey Shown, MD   Encounter Date: 03/12/2019  PT End of Session - 03/12/19 1224    Visit Number  9    Number of Visits  18    Date for PT Re-Evaluation  03/27/19    PT Start Time  2951    PT Stop Time  8841    PT Time Calculation (min)  50 min    Activity Tolerance  Patient tolerated treatment well    Behavior During Therapy  [facility] for tasks assessed/performed       Past Medical History:  Diagnosis Date  . Allergy    albuterol as needed  . Anal fissure   . Arthritis   . Colon polyps 01/13/11   Colonoscopy  . GERD (gastroesophageal reflux disease)   . Pneumonia   . Pre-diabetes     Past Surgical History:  Procedure Laterality Date  . ANAL FISSURE REPAIR  02/24/11  . CHONDROPLASTY  08/14/2018   Procedure: CHONDROPLASTY;  Surgeon: Leandrew Koyanagi, MD;  Location: [place];  Service: Orthopedics;;  . COLONOSCOPY    . KNEE ARTHROSCOPY WITH MEDIAL MENISECTOMY Left 08/14/2018   Procedure: LEFT KNEE ARTHROSCOPY WITH PARTIAL MEDIAL MENISCECTOMY;  Surgeon: Leandrew Koyanagi, MD;  Location: [place];  Service: Orthopedics;  Laterality: Left;  . KNEE ARTHROSCOPY WITH MEDIAL MENISECTOMY Right 10/18/2018   Procedure: RIGHT KNEE ARTHROSCOPY WITH PARTIAL MEDIAL MENISCECTOMY;  Surgeon: Leandrew Koyanagi, MD;  Location: [place];  Service: Orthopedics;  Laterality: Right;  . TOTAL KNEE ARTHROPLASTY Left 01/24/2019   Procedure: LEFT TOTAL KNEE ARTHROPLASTY;  Surgeon: Leandrew Koyanagi, MD;  Location: [place];  Service: Orthopedics;  Laterality: Left;    There were no vitals filed for this visit.  Subjective Assessment - 03/12/19 1152    Subjective  Pt. indicated pain 7/ 10 or so while waiting  upon arrival today.  had difficulty sleeping.    Pain Score  7     Pain Location  Knee    Pain Orientation  Left    Pain Descriptors / Indicators  Aching;Throbbing    Pain Type  Surgical pain    Pain Onset  1 to 4 weeks ago                       [facility] Adult PT Treatment/Exercise - 03/12/19 0001      Neuro Re-ed    Neuro Re-ed Details   fwd/rev ambulation in parallel bars 10 ft x 6 each way, retro step 20 x      Knee/Hip Exercises: Stretches   Gastroc Stretch  3 reps;30 seconds      Knee/Hip Exercises: Aerobic   Recumbent Bike  partial revolutions 10 mins       Knee/Hip Exercises: Machines for Horticulturist, commercial SL leg press 3 x 10 50 lbs      Knee/Hip Exercises: Standing   Heel Raises  Both;20 reps   heel and toe raises     Knee/Hip Exercises: Seated   Long Arc Quad  Strengthening;Left;Other (comment)    Long Arc Quad Weight  4 lbs.      Knee/Hip Exercises: Supine   Straight Leg Raises  Strengthening;Left   3 mins  c NMES 5 sec on Wellsite geologist Action  NMES c exercise    Electrical Stimulation Parameters  to muscle contraction    Electrical Stimulation Goals  Strength      Vasopneumatic   Number Minutes Vasopneumatic   10 minutes    Vasopnuematic Location   Knee    Vasopneumatic Pressure  Medium    Vasopneumatic Temperature   34             PT Education - 03/12/19 1154    Education Details  Pain description education (surgical vs. movement based).    Person(s) Educated  Patient    Methods  Explanation    Comprehension  Verbalized understanding          PT Long Term Goals - 02/18/19 1155      PT LONG TERM GOAL #1   Title  independent with HEP    Status  On-going      PT LONG TERM GOAL #2   Title  improve Lt knee AROM 0-110 for improved function    Status  On-going      PT LONG TERM GOAL #3   Title  amb without  AD without gait deviations for improved function and mobility    Status  On-going      PT LONG TERM GOAL #4   Title  report pain < 3/10 with ambulation for improved function    Status  On-going            Plan - 03/12/19 1223    Clinical Impression Statement  Pt. demonstrated mild improvements in WB acceptance and control on fwd/reverse ambulation techniques.  Transitioned to SL leg press activity c fatigue noted but improved tolerance to strengthening activity as well as slow and steady progression in active TKE.    Stability/Clinical Decision Making  Stable/Uncomplicated    Rehab Potential  Good    PT Frequency  2x / week    PT Duration  6 weeks    PT Treatment/Interventions  ADLs/Self Care Home Management;Cryotherapy;Electrical Stimulation;Iontophoresis 4mg /ml Dexamethasone;Moist Heat;Balance training;Therapeutic exercise;Therapeutic activities;Functional mobility training;Stair training;Gait training;DME Instruction;Neuromuscular re-education;Patient/family education;Manual techniques;Vasopneumatic Device;Taping;Dry needling;Passive range of motion    PT Next Visit Plan  Continued NMES use for quad strength, step strengthening as tolerated/appropriate.    PT Home Exercise Plan  Access Code: [email or web address]    Consulted and Agree with Plan of Care  Patient       Patient will benefit from skilled therapeutic intervention in order to improve the following deficits and impairments:  Abnormal gait, Pain, Decreased strength, Difficulty walking, Decreased mobility, Impaired flexibility, Decreased range of motion, Increased edema  Visit Diagnosis: Acute pain of left knee  Stiffness of left knee, not elsewhere classified  Other abnormalities of gait and mobility  Muscle weakness (generalized)  Localized edema     Problem List Patient Active Problem List   Diagnosis Date Noted  . Status post total left knee replacement 01/24/2019  . Primary osteoarthritis of left knee 12/17/2018  .  Primary osteoarthritis of right knee 12/17/2018  . Complex tear of medial meniscus of right knee as current injury 08/06/2018  . Overweight (BMI 25.0-29.9) 08/08/2017  . Medication management 03/23/2014  . Vitamin D deficiency 03/23/2014  . Abnormal glucose 03/23/2014  . Hyperlipemia 10/31/2006  . GERD 10/31/2006    11/02/2006, PT, DPT, OCS, ATC 03/12/19  12:27 PM    [facility] Physical Therapy [address] [place], [place], [address] Phone: [phone]   Fax:  [phone]  Name: Brett [name] MRN: [id number] Date of Birth: 07-01-1960

## 2019-03-14 ENCOUNTER — Encounter: Payer: Self-pay | Admitting: Rehabilitative and Restorative Service Providers"

## 2019-03-14 ENCOUNTER — Ambulatory Visit (INDEPENDENT_AMBULATORY_CARE_PROVIDER_SITE_OTHER): Payer: BC Managed Care – PPO | Admitting: Rehabilitative and Restorative Service Providers"

## 2019-03-14 ENCOUNTER — Other Ambulatory Visit: Payer: Self-pay

## 2019-03-14 DIAGNOSIS — M6281 Muscle weakness (generalized): Secondary | ICD-10-CM

## 2019-03-14 DIAGNOSIS — R2689 Other abnormalities of gait and mobility: Secondary | ICD-10-CM

## 2019-03-14 DIAGNOSIS — R600 Localized edema: Secondary | ICD-10-CM

## 2019-03-14 DIAGNOSIS — M25662 Stiffness of left knee, not elsewhere classified: Secondary | ICD-10-CM

## 2019-03-14 DIAGNOSIS — M25562 Pain in left knee: Secondary | ICD-10-CM

## 2019-03-14 NOTE — Therapy (Signed)
[facility] Physical Therapy [address] [place], [place], [address] Phone: [phone]   Fax:  [phone]  Physical Therapy Treatment  Patient Details  Name: Brett [name] MRN: [id number] Date of Birth: 1960-02-26 Referring Provider (PT): Gershon Mussel, MD   Encounter Date: 03/14/2019  PT End of Session - 03/14/19 1219    Visit Number  10    Number of Visits  18    Date for PT Re-Evaluation  03/27/19    PT Start Time  1146    PT Stop Time  1235    PT Time Calculation (min)  49 min    Activity Tolerance  Patient tolerated treatment well    Behavior During Therapy  [facility] for tasks assessed/performed       Past Medical History:  Diagnosis Date  . Allergy    albuterol as needed  . Anal fissure   . Arthritis   . Colon polyps 01/13/11   Colonoscopy  . GERD (gastroesophageal reflux disease)   . Pneumonia   . Pre-diabetes     Past Surgical History:  Procedure Laterality Date  . ANAL FISSURE REPAIR  02/24/11  . CHONDROPLASTY  08/14/2018   Procedure: CHONDROPLASTY;  Surgeon: Tarry Kos, MD;  Location: [facility];  Service: Orthopedics;;  . COLONOSCOPY    . KNEE ARTHROSCOPY WITH MEDIAL MENISECTOMY Left 08/14/2018   Procedure: LEFT KNEE ARTHROSCOPY WITH PARTIAL MEDIAL MENISCECTOMY;  Surgeon: Tarry Kos, MD;  Location: [facility];  Service: Orthopedics;  Laterality: Left;  . KNEE ARTHROSCOPY WITH MEDIAL MENISECTOMY Right 10/18/2018   Procedure: RIGHT KNEE ARTHROSCOPY WITH PARTIAL MEDIAL MENISCECTOMY;  Surgeon: Tarry Kos, MD;  Location: [facility];  Service: Orthopedics;  Laterality: Right;  . TOTAL KNEE ARTHROPLASTY Left 01/24/2019   Procedure: LEFT TOTAL KNEE ARTHROPLASTY;  Surgeon: Tarry Kos, MD;  [facility];  Service: Orthopedics;  Laterality: Left;    There were no vitals filed for this visit.  Subjective Assessment - 03/14/19 1152    Subjective  Pt. reported feeling about half and half on  pain.  Moderate level reported upon arrival.    Currently in Pain?  Yes    Pain Score  5     Pain Location  Knee    Pain Orientation  Left    Pain Descriptors / Indicators  Aching;Throbbing    Pain Onset  1 to 4 weeks ago                       [facility] Adult PT Treatment/Exercise - 03/14/19 0001      Ambulation/Gait   Ambulation Distance (Feet)  100 Feet   ft x 2   Gait Comments  SPC ambulation in clinic c SBA c cues verbal and visual for sequencing.       Knee/Hip Exercises: Aerobic   Nustep  L5 7 mins      Knee/Hip Exercises: Machines for Strengthening   Other Machine  Shuttle resistance SL leg press 3 x 10 50 lbs      Knee/Hip Exercises: Standing   Heel Raises  Both;20 reps      Knee/Hip Exercises: Seated   Long Arc Quad  Strengthening;Left;Other (comment)    Long Arc Quad Weight  4 lbs.      Knee/Hip Exercises: Supine   Straight Leg Raises  Strengthening;Left      Financial trader  NMES c exercises,     Electrical Stimulation Parameters  to muscle contraction     Electrical Stimulation Goals  Strength      Vasopneumatic   Number Minutes Vasopneumatic   10 minutes    Vasopnuematic Location   Knee    Vasopneumatic Pressure  Medium    Vasopneumatic Temperature   34      Manual Therapy   Joint Mobilization  g2-g3 ap jt mobs Lt knee             PT Education - 03/14/19 1153    Education Details  Cues for intervention, SPC sequencing.    Person(s) Educated  Patient    Methods  Explanation;Demonstration;Verbal cues    Comprehension  Returned demonstration;Verbalized understanding          PT Long Term Goals - 02/18/19 1155      PT LONG TERM GOAL #1   Title  independent with HEP    Status  On-going      PT LONG TERM GOAL #2   Title  improve Lt knee AROM 0-110 for improved function    Status  On-going      PT LONG TERM GOAL #3   Title  amb without AD  without gait deviations for improved function and mobility    Status  On-going      PT LONG TERM GOAL #4   Title  report pain < 3/10 with ambulation for improved function    Status  On-going            Plan - 03/14/19 1217    Clinical Impression Statement  Despite aggravated symptom irritability levels, Pt. demonstrated continued improvement in quality of TKE movement, making progress towards goals.  Symptom presentation does present a barrier for continued progression of WB activity.    Stability/Clinical Decision Making  Stable/Uncomplicated    Rehab Potential  Good    PT Frequency  2x / week    PT Duration  6 weeks    PT Treatment/Interventions  ADLs/Self Care Home Management;Cryotherapy;Electrical Stimulation;Iontophoresis 4mg /ml Dexamethasone;Moist Heat;Balance training;Therapeutic exercise;Therapeutic activities;Functional mobility training;Stair training;Gait training;DME Instruction;Neuromuscular re-education;Patient/family education;Manual techniques;Vasopneumatic Device;Taping;Dry needling;Passive range of motion    PT Next Visit Plan  NMES, IFC for strength/symptoms, progress WB activity as symptoms allow.    PT Home Exercise Plan  Access Code: [email or web address]    Consulted and Agree with Plan of Care  Patient       Patient will benefit from skilled therapeutic intervention in order to improve the following deficits and impairments:  Abnormal gait, Pain, Decreased strength, Difficulty walking, Decreased mobility, Impaired flexibility, Decreased range of motion, Increased edema  Visit Diagnosis: Acute pain of left knee  Stiffness of left knee, not elsewhere classified  Other abnormalities of gait and mobility  Muscle weakness (generalized)  Localized edema     Problem List Patient Active Problem List   Diagnosis Date Noted  . Status post total left knee replacement 01/24/2019  . Primary osteoarthritis of left knee 12/17/2018  . Primary osteoarthritis of right knee  12/17/2018  . Complex tear of medial meniscus of right knee as current injury 08/06/2018  . Overweight (BMI 25.0-29.9) 08/08/2017  . Medication management 03/23/2014  . Vitamin D deficiency 03/23/2014  . Abnormal glucose 03/23/2014  . Hyperlipemia 10/31/2006  . GERD 10/31/2006    11/02/2006, PT, DPT, OCS, ATC 03/14/19  12:28 PM    [facility] [facility] Physical Therapy [address] [place], [place], [place] Phone: [phone]   Fax:  [phone]  Name: Brett [name] MRN: [id number] Date of Birth: [birth year]

## 2019-03-17 ENCOUNTER — Other Ambulatory Visit: Payer: Self-pay

## 2019-03-17 ENCOUNTER — Encounter: Payer: Self-pay | Admitting: Rehabilitative and Restorative Service Providers"

## 2019-03-17 ENCOUNTER — Ambulatory Visit (INDEPENDENT_AMBULATORY_CARE_PROVIDER_SITE_OTHER): Payer: BC Managed Care – PPO | Admitting: Rehabilitative and Restorative Service Providers"

## 2019-03-17 DIAGNOSIS — M25562 Pain in left knee: Secondary | ICD-10-CM

## 2019-03-17 DIAGNOSIS — R2689 Other abnormalities of gait and mobility: Secondary | ICD-10-CM | POA: Diagnosis not present

## 2019-03-17 DIAGNOSIS — R600 Localized edema: Secondary | ICD-10-CM

## 2019-03-17 DIAGNOSIS — M6281 Muscle weakness (generalized): Secondary | ICD-10-CM

## 2019-03-17 DIAGNOSIS — M25662 Stiffness of left knee, not elsewhere classified: Secondary | ICD-10-CM

## 2019-03-17 NOTE — Therapy (Signed)
[facility] [address] [place], [place], [address] Phone: [phone]   Fax:  [phone]  Physical Therapy Treatment  Patient Details  Name: Brett [name] MRN: [id number] Date of Birth: 10/29/1960 Referring Provider (PT): Gershon Mussel, MD   Encounter Date: 03/17/2019  PT End of Session - 03/17/19 1207    Visit Number  11    Number of Visits  18    Date for PT Re-Evaluation  03/27/19    PT Start Time  1146    PT Stop Time  1236    PT Time Calculation (min)  50 min    Activity Tolerance  Patient tolerated treatment well    Behavior During Therapy  [facility] for tasks assessed/performed       Past Medical History:  Diagnosis Date  . Allergy    albuterol as needed  . Anal fissure   . Arthritis   . Colon polyps 01/13/11   Colonoscopy  . GERD (gastroesophageal reflux disease)   . Pneumonia   . Pre-diabetes     Past Surgical History:  Procedure Laterality Date  . ANAL FISSURE REPAIR  02/24/11  . CHONDROPLASTY  08/14/2018   Procedure: CHONDROPLASTY;  Surgeon: Tarry Kos, MD;  Location: [facility];  Service: Orthopedics;;  . COLONOSCOPY    . KNEE ARTHROSCOPY WITH MEDIAL MENISECTOMY Left 08/14/2018   Procedure: LEFT KNEE ARTHROSCOPY WITH PARTIAL MEDIAL MENISCECTOMY;  Surgeon: Tarry Kos, MD;  Location: [facility];  Service: Orthopedics;  Laterality: Left;  . KNEE ARTHROSCOPY WITH MEDIAL MENISECTOMY Right 10/18/2018   Procedure: RIGHT KNEE ARTHROSCOPY WITH PARTIAL MEDIAL MENISCECTOMY;  Surgeon: Tarry Kos, MD;  Location: [facility];  Service: Orthopedics;  Laterality: Right;  . TOTAL KNEE ARTHROPLASTY Left 01/24/2019   Procedure: LEFT TOTAL KNEE ARTHROPLASTY;  Surgeon: Tarry Kos, MD;  [facility];  Service: Orthopedics;  Laterality: Left;    There were no vitals filed for this visit.  Subjective Assessment - 03/17/19 1202    Subjective  Pt. indicated 7/10 pain upon arrival today.   Pt. indicated having pain increase in front of knee afternoon after last visit.  Pt. stated pain was constant.  Pt. indicated he thought it was due to the pressure of leg press.    Currently in Pain?  Yes    Pain Score  7     Pain Location  Knee    Pain Orientation  Left    Pain Descriptors / Indicators  Aching;Throbbing    Pain Type  Surgical pain    Pain Onset  1 to 4 weeks ago                       [facility] Adult PT Treatment/Exercise - 03/17/19 0001      Knee/Hip Exercises: Stretches   Gastroc Stretch  5 reps;30 seconds   incline board     Knee/Hip Exercises: Aerobic   Nustep  L5 7 mins      Knee/Hip Exercises: Standing   Heel Raises  Both;20 reps      Knee/Hip Exercises: Seated   Long Arc Quad  Strengthening;Left;Other (comment)   3 x 10   Long Arc Quad Weight  4 lbs.    Hamstring Curl  Strengthening;3 sets   3 x 10 green band     Knee/Hip Exercises: Supine   Straight Leg Raises  Strengthening;Left;10 reps      Programme researcher, broadcasting/film/video Location  Lt knee     Electrical Stimulation Action  IFC   during manual and vaso   Electrical Stimulation Parameters  to tolerance    Electrical Stimulation Goals  Pain      Vasopneumatic   Number Minutes Vasopneumatic   6 minutes    Vasopnuematic Location   Knee    Vasopneumatic Pressure  Medium    Vasopneumatic Temperature   34      Manual Therapy   Joint Mobilization  g2-g3 ap jt mobs Lt knee             PT Education - 03/17/19 1204    Education Details  Education regarding pain response to activity (when its present during activity or not, muscle soreness vs. pain)    Person(s) Educated  Patient    Methods  Explanation;Demonstration    Comprehension  Verbalized understanding;Returned demonstration          PT Long Term Goals - 02/18/19 1155      PT LONG TERM GOAL #1   Title  independent with HEP    Status  On-going      PT LONG TERM GOAL #2   Title  improve Lt knee  AROM 0-110 for improved function    Status  On-going      PT LONG TERM GOAL #3   Title  amb without AD without gait deviations for improved function and mobility    Status  On-going      PT LONG TERM GOAL #4   Title  report pain < 3/10 with ambulation for improved function    Status  On-going            Plan - 03/17/19 1206    Clinical Impression Statement  Pt. presented today and continues to present overall c higher severity and irritability of symptoms realted to Lt knee at this time.  Pt. does not typically indicate marked increased symptoms during intervention but does report consistent pain levels at home.    Stability/Clinical Decision Making  Stable/Uncomplicated    Rehab Potential  Good    PT Frequency  2x / week    PT Duration  6 weeks    PT Treatment/Interventions  ADLs/Self Care Home Management;Cryotherapy;Electrical Stimulation;Iontophoresis 4mg /ml Dexamethasone;Moist Heat;Balance training;Therapeutic exercise;Therapeutic activities;Functional mobility training;Stair training;Gait training;DME Instruction;Neuromuscular re-education;Patient/family education;Manual techniques;Vasopneumatic Device;Taping;Dry needling;Passive range of motion    PT Next Visit Plan  NMES, IFC for strength/symptoms, progress WB activity as symptoms allow.    PT Home Exercise Plan  Access Code: [email or web address]    Consulted and Agree with Plan of Care  Patient       Patient will benefit from skilled therapeutic intervention in order to improve the following deficits and impairments:  Abnormal gait, Pain, Decreased strength, Difficulty walking, Decreased mobility, Impaired flexibility, Decreased range of motion, Increased edema  Visit Diagnosis: Acute pain of left knee  Stiffness of left knee, not elsewhere classified  Other abnormalities of gait and mobility  Muscle weakness (generalized)  Localized edema     Problem List Patient Active Problem List   Diagnosis Date Noted  . Status post  total left knee replacement 01/24/2019  . Primary osteoarthritis of left knee 12/17/2018  . Primary osteoarthritis of right knee 12/17/2018  . Complex tear of medial meniscus of right knee as current injury 08/06/2018  . Overweight (BMI 25.0-29.9) 08/08/2017  . Medication management 03/23/2014  . Vitamin D deficiency 03/23/2014  . Abnormal glucose 03/23/2014  . Hyperlipemia 10/31/2006  . GERD 10/31/2006  [name], PT, DPT, OCS, ATC 03/17/19  12:35 PM    [facility] [address] [place], [place], [address] Phone: [phone]   Fax:  [phone]  Name: Brett [name] MRN: [id number] Date of Birth: April 09, 1960

## 2019-03-19 ENCOUNTER — Ambulatory Visit (INDEPENDENT_AMBULATORY_CARE_PROVIDER_SITE_OTHER): Payer: BC Managed Care – PPO | Admitting: Rehabilitative and Restorative Service Providers"

## 2019-03-19 ENCOUNTER — Encounter: Payer: Self-pay | Admitting: Rehabilitative and Restorative Service Providers"

## 2019-03-19 ENCOUNTER — Other Ambulatory Visit: Payer: Self-pay

## 2019-03-19 DIAGNOSIS — M6281 Muscle weakness (generalized): Secondary | ICD-10-CM | POA: Diagnosis not present

## 2019-03-19 DIAGNOSIS — M25662 Stiffness of left knee, not elsewhere classified: Secondary | ICD-10-CM | POA: Diagnosis not present

## 2019-03-19 DIAGNOSIS — R2689 Other abnormalities of gait and mobility: Secondary | ICD-10-CM

## 2019-03-19 DIAGNOSIS — M25562 Pain in left knee: Secondary | ICD-10-CM | POA: Diagnosis not present

## 2019-03-19 DIAGNOSIS — R600 Localized edema: Secondary | ICD-10-CM

## 2019-03-19 NOTE — Therapy (Signed)
[facility] [address] [place], [place], [address] Phone: [phone]   Fax:  [phone]  Physical Therapy Treatment  Patient Details  Name: Brett [name] MRN: [id number] Date of Birth: 1960-11-20 Referring Provider (PT): Gershon Mussel, MD   Encounter Date: 03/19/2019  PT End of Session - 03/19/19 1231    Visit Number  12    Number of Visits  18    Date for PT Re-Evaluation  03/27/19    PT Start Time  1145    PT Stop Time  1235    PT Time Calculation (min)  50 min    Activity Tolerance  Patient tolerated treatment well    Behavior During Therapy  [facility] for tasks assessed/performed       Past Medical History:  Diagnosis Date  . Allergy    albuterol as needed  . Anal fissure   . Arthritis   . Colon polyps 01/13/11   Colonoscopy  . GERD (gastroesophageal reflux disease)   . Pneumonia   . Pre-diabetes     Past Surgical History:  Procedure Laterality Date  . ANAL FISSURE REPAIR  02/24/11  . CHONDROPLASTY  08/14/2018   Procedure: CHONDROPLASTY;  Surgeon: Tarry Kos, MD;  Location: [facility];  Service: Orthopedics;;  . COLONOSCOPY    . KNEE ARTHROSCOPY WITH MEDIAL MENISECTOMY Left 08/14/2018   Procedure: LEFT KNEE ARTHROSCOPY WITH PARTIAL MEDIAL MENISCECTOMY;  Surgeon: Tarry Kos, MD;  Location: [facility];  Service: Orthopedics;  Laterality: Left;  . KNEE ARTHROSCOPY WITH MEDIAL MENISECTOMY Right 10/18/2018   Procedure: RIGHT KNEE ARTHROSCOPY WITH PARTIAL MEDIAL MENISCECTOMY;  Surgeon: Tarry Kos, MD;  Location: [facility];  Service: Orthopedics;  Laterality: Right;  . TOTAL KNEE ARTHROPLASTY Left 01/24/2019   Procedure: LEFT TOTAL KNEE ARTHROPLASTY;  Surgeon: Tarry Kos, MD;  Location: [facility];  Service: Orthopedics;  Laterality: Left;    There were no vitals filed for this visit.  Subjective Assessment - 03/19/19 1200    Subjective  Pt. indicated not feeling the pain increase  after last visit.  Still feeling moderate to high moderate pain levels fairly constant.  Continued walker use, as well as some complaints of Rt side acting up too.    Currently in Pain?  Yes    Pain Score  6     Pain Location  Knee    Pain Orientation  Left    Pain Descriptors / Indicators  Aching;Throbbing    Pain Type  Surgical pain    Pain Onset  1 to 4 weeks ago                       [facility] Adult PT Treatment/Exercise - 03/19/19 0001      Ambulation/Gait   Ambulation/Gait Assistance  5: Supervision    Ambulation/Gait Assistance Details  200 ft c SPC      Neuro Re-ed    Neuro Re-ed Details   fwd/rev ambulation in parallel bars 10 ft x 6 each way, retro step 20 x      Knee/Hip Exercises: Stretches   Gastroc Stretch  5 reps;30 seconds      Knee/Hip Exercises: Aerobic   Recumbent Bike  9 mins fwd revolution      Knee/Hip Exercises: Machines for Strengthening   Other Machine  Shuttle leg press SL 3 x 10 25 lbs bilaterally      Knee/Hip Exercises: Standing   Heel Raises  Both;20  reps    Forward Step Up  Both;1 set;10 reps   4 inch step c bilat HHA and marked support     Knee/Hip Exercises: Supine   Straight Leg Raises  Strengthening;Left;10 reps;2 sets      Electrical Stimulation   Electrical Stimulation Location  Lt knee     Electrical Stimulation Action  IFC during manual intervention    Electrical Stimulation Parameters  to tolerance    Electrical Stimulation Goals  Pain      Vasopneumatic   Number Minutes Vasopneumatic   10 minutes    Vasopnuematic Location   Knee    Vasopneumatic Pressure  Medium    Vasopneumatic Temperature   34      Manual Therapy   Joint Mobilization  g2-g3 ap jt mobs Lt knee             PT Education - 03/19/19 1201    Education Details  Intervention cues.  Education on using HEP for Rt side as well as tolerated.    Person(s) Educated  Patient    Methods  Explanation;Demonstration    Comprehension  Verbalized  understanding;Returned demonstration          PT Long Term Goals - 02/18/19 1155      PT LONG TERM GOAL #1   Title  independent with HEP    Status  On-going      PT LONG TERM GOAL #2   Title  improve Lt knee AROM 0-110 for improved function    Status  On-going      PT LONG TERM GOAL #3   Title  amb without AD without gait deviations for improved function and mobility    Status  On-going      PT LONG TERM GOAL #4   Title  report pain < 3/10 with ambulation for improved function    Status  On-going            Plan - 03/19/19 1228    Clinical Impression Statement  Pt. demonstrated -5 extension on SLR today and improved to 0 c cues.  Pt. also demonstrated improved WB acceptance and control in [facility] ambulation in clinic on even surfaces.  Pt. does not yet present c safety and control well enough to encourage SPC use outside of therapy.    Stability/Clinical Decision Making  Stable/Uncomplicated    Rehab Potential  Good    PT Frequency  2x / week    PT Duration  6 weeks    PT Treatment/Interventions  ADLs/Self Care Home Management;Cryotherapy;Electrical Stimulation;Iontophoresis 4mg /ml Dexamethasone;Moist Heat;Balance training;Therapeutic exercise;Therapeutic activities;Functional mobility training;Stair training;Gait training;DME Instruction;Neuromuscular re-education;Patient/family education;Manual techniques;Vasopneumatic Device;Taping;Dry needling;Passive range of motion    PT Next Visit Plan  Continue to improve SPC use, progress WB strength control for functional mobility as symptoms allow.    PT Home Exercise Plan  Access Code: [email or web address]    Consulted and Agree with Plan of Care  Patient       Patient will benefit from skilled therapeutic intervention in order to improve the following deficits and impairments:  Abnormal gait, Pain, Decreased strength, Difficulty walking, Decreased mobility, Impaired flexibility, Decreased range of motion, Increased edema  Visit  Diagnosis: Acute pain of left knee  Stiffness of left knee, not elsewhere classified  Other abnormalities of gait and mobility  Muscle weakness (generalized)  Localized edema     Problem List Patient Active Problem List   Diagnosis Date Noted  . Status post total left knee replacement 01/24/2019  .  Primary osteoarthritis of left knee 12/17/2018  . Primary osteoarthritis of right knee 12/17/2018  . Complex tear of medial meniscus of right knee as current injury 08/06/2018  . Overweight (BMI 25.0-29.9) 08/08/2017  . Medication management 03/23/2014  . Vitamin D deficiency 03/23/2014  . Abnormal glucose 03/23/2014  . Hyperlipemia 10/31/2006  . GERD 10/31/2006    [name], PT, DPT, OCS, ATC 03/19/19  12:32 PM    [facility] [address] [place], [place], [address] Phone: [phone]   Fax:  [phone]  Name: Emit Kuenzel MRN: [id number] Date of Birth: 07-Jan-1960

## 2019-03-20 ENCOUNTER — Telehealth: Payer: Self-pay | Admitting: Orthopaedic Surgery

## 2019-03-20 NOTE — Telephone Encounter (Signed)
Patient called.. he needs note stating his initial date of physical therapy of 2/11. Needs note faxed to [facility] disability [phone], Darin Engels. pts callback [phone]

## 2019-03-20 NOTE — Telephone Encounter (Signed)
Faxed to [place]. Patient aware.

## 2019-03-21 ENCOUNTER — Encounter: Payer: BC Managed Care – PPO | Admitting: Rehabilitative and Restorative Service Providers"

## 2019-03-21 ENCOUNTER — Telehealth: Payer: Self-pay | Admitting: Orthopaedic Surgery

## 2019-03-21 NOTE — Telephone Encounter (Signed)
Patient called.   Requesting a refill on his hydrocodone.   Call back: [phone]

## 2019-03-24 ENCOUNTER — Other Ambulatory Visit: Payer: Self-pay

## 2019-03-24 ENCOUNTER — Ambulatory Visit (INDEPENDENT_AMBULATORY_CARE_PROVIDER_SITE_OTHER): Payer: BC Managed Care – PPO | Admitting: Physical Therapy

## 2019-03-24 ENCOUNTER — Other Ambulatory Visit: Payer: Self-pay | Admitting: Physician Assistant

## 2019-03-24 ENCOUNTER — Encounter: Payer: Self-pay | Admitting: Physical Therapy

## 2019-03-24 DIAGNOSIS — R2689 Other abnormalities of gait and mobility: Secondary | ICD-10-CM | POA: Diagnosis not present

## 2019-03-24 DIAGNOSIS — M6281 Muscle weakness (generalized): Secondary | ICD-10-CM

## 2019-03-24 DIAGNOSIS — M25562 Pain in left knee: Secondary | ICD-10-CM

## 2019-03-24 DIAGNOSIS — M25662 Stiffness of left knee, not elsewhere classified: Secondary | ICD-10-CM

## 2019-03-24 DIAGNOSIS — R600 Localized edema: Secondary | ICD-10-CM

## 2019-03-24 MED ORDER — HYDROCODONE-ACETAMINOPHEN 5-325 MG PO TABS: 1.0000 | ORAL_TABLET | Freq: Every day | ORAL | 0 refills | Status: DC | PRN

## 2019-03-24 NOTE — Therapy (Signed)
[facility] [address] [place], [place], [address] Phone: [phone]   Fax:  [phone]  Physical Therapy Treatment  Patient Details  Name: Brett [name] MRN: [id number] Date of Birth: 1960/08/04 Referring Provider (PT): Gershon Mussel, MD   Encounter Date: 03/24/2019  PT End of Session - 03/24/19 1257    Visit Number  13    Number of Visits  18    Date for PT Re-Evaluation  03/27/19    PT Start Time  1148    PT Stop Time  1231    PT Time Calculation (min)  43 min    Activity Tolerance  Patient tolerated treatment well    Behavior During Therapy  [facility] for tasks assessed/performed       Past Medical History:  Diagnosis Date  . Allergy    albuterol as needed  . Anal fissure   . Arthritis   . Colon polyps 01/13/11   Colonoscopy  . GERD (gastroesophageal reflux disease)   . Pneumonia   . Pre-diabetes     Past Surgical History:  Procedure Laterality Date  . ANAL FISSURE REPAIR  02/24/11  . CHONDROPLASTY  08/14/2018   Procedure: CHONDROPLASTY;  Surgeon: Tarry Kos, MD;  Location: [facility];  Service: Orthopedics;;  . COLONOSCOPY    . KNEE ARTHROSCOPY WITH MEDIAL MENISECTOMY Left 08/14/2018   Procedure: LEFT KNEE ARTHROSCOPY WITH PARTIAL MEDIAL MENISCECTOMY;  Surgeon: Tarry Kos, MD;  Location: [facility];  Service: Orthopedics;  Laterality: Left;  . KNEE ARTHROSCOPY WITH MEDIAL MENISECTOMY Right 10/18/2018   Procedure: RIGHT KNEE ARTHROSCOPY WITH PARTIAL MEDIAL MENISCECTOMY;  Surgeon: Tarry Kos, MD;  Location: [facility];  Service: Orthopedics;  Laterality: Right;  . TOTAL KNEE ARTHROPLASTY Left 01/24/2019   Procedure: LEFT TOTAL KNEE ARTHROPLASTY;  Surgeon: Tarry Kos, MD;  Location: [facility];  Service: Orthopedics;  Laterality: Left;    There were no vitals filed for this visit.  Subjective Assessment - 03/24/19 1154    Subjective  doing well - still using RW    Patient  Stated Goals  return to work    Currently in Pain?  Yes    Pain Score  5     Pain Location  Knee    Pain Orientation  Left    Pain Descriptors / Indicators  Aching;Throbbing    Pain Type  Surgical pain;Acute pain    Pain Frequency  Constant    Aggravating Factors   bending knee; sleeping, standing/walking for extended periods    Pain Relieving Factors  meds, ice/heat, repositioning                       [facility] Adult PT Treatment/Exercise - 03/24/19 1155      Knee/Hip Exercises: Aerobic   Recumbent Bike  9 mins fwd revolution - UBE seat 11; L2      Knee/Hip Exercises: Standing   Terminal Knee Extension  Left;2 sets;10 reps;Theraband    Theraband Level (Terminal Knee Extension)  Level 3 (Green)    Terminal Knee Extension Limitations  5 sec hold    Functional Squat  2 sets;10 reps;5 seconds    Functional Squat Limitations  TRX      Knee/Hip Exercises: Seated   Long Arc Quad  Strengthening;Left;Other (comment);3 sets;10 reps    Long Arc Quad Weight  5 lbs.      Knee/Hip Exercises: Supine   Short Arc Quad Sets  Left;3 sets;10  reps    Short Arc Quad Sets Limitations  5#                  PT Long Term Goals - 03/24/19 1259      PT LONG TERM GOAL #1   Title  independent with HEP    Status  On-going      PT LONG TERM GOAL #2   Title  improve Lt knee AROM 0-110 for improved function    Status  On-going      PT LONG TERM GOAL #3   Title  amb without AD without gait deviations for improved function and mobility    Status  On-going      PT LONG TERM GOAL #4   Title  report pain < 3/10 with ambulation for improved function    Status  On-going      PT LONG TERM GOAL #5   Title  demonstrate improved quad strength by performing at least 10 reps SLR without quad lag    Status  On-going            Plan - 03/24/19 1259    Clinical Impression Statement  Pt demonstrated improved quad control today with amb with cane without buckling noted.  Overall  continues to progress well with PT, working on quad activation and functional strength.  Will continue to benefit from PT to maximize function.    Stability/Clinical Decision Making  Stable/Uncomplicated    Rehab Potential  Good    PT Frequency  2x / week    PT Duration  6 weeks    PT Treatment/Interventions  ADLs/Self Care Home Management;Cryotherapy;Electrical Stimulation;Iontophoresis 4mg /ml Dexamethasone;Moist Heat;Balance training;Therapeutic exercise;Therapeutic activities;Functional mobility training;Stair training;Gait training;DME Instruction;Neuromuscular re-education;Patient/family education;Manual techniques;Vasopneumatic Device;Taping;Dry needling;Passive range of motion    PT Next Visit Plan  Continue to improve SPC use, progress WB strength control for functional mobility as symptoms allow.    PT Home Exercise Plan  Access Code: [email or web address]    Consulted and Agree with Plan of Care  Patient       Patient will benefit from skilled therapeutic intervention in order to improve the following deficits and impairments:  Abnormal gait, Pain, Decreased strength, Difficulty walking, Decreased mobility, Impaired flexibility, Decreased range of motion, Increased edema  Visit Diagnosis: Acute pain of left knee  Stiffness of left knee, not elsewhere classified  Other abnormalities of gait and mobility  Muscle weakness (generalized)  Localized edema     Problem List Patient Active Problem List   Diagnosis Date Noted  . Status post total left knee replacement 01/24/2019  . Primary osteoarthritis of left knee 12/17/2018  . Primary osteoarthritis of right knee 12/17/2018  . Complex tear of medial meniscus of right knee as current injury 08/06/2018  . Overweight (BMI 25.0-29.9) 08/08/2017  . Medication management 03/23/2014  . Vitamin D deficiency 03/23/2014  . Abnormal glucose 03/23/2014  . Hyperlipemia 10/31/2006  . GERD 10/31/2006      Laureen Abrahams, PT, DPT 03/24/19  1:02 PM     [facility] [address] [place], [place], [address] Phone: [phone]   Fax:  [phone]  Name: Brett [name] MRN: [id number] Date of Birth: 1960-02-20

## 2019-03-24 NOTE — Telephone Encounter (Signed)
Sent in but needs to start weaning

## 2019-03-26 ENCOUNTER — Encounter: Payer: Self-pay | Admitting: Physical Therapy

## 2019-03-26 ENCOUNTER — Other Ambulatory Visit: Payer: Self-pay

## 2019-03-26 ENCOUNTER — Ambulatory Visit (INDEPENDENT_AMBULATORY_CARE_PROVIDER_SITE_OTHER): Payer: BC Managed Care – PPO | Admitting: Physical Therapy

## 2019-03-26 DIAGNOSIS — R600 Localized edema: Secondary | ICD-10-CM

## 2019-03-26 DIAGNOSIS — M6281 Muscle weakness (generalized): Secondary | ICD-10-CM | POA: Diagnosis not present

## 2019-03-26 DIAGNOSIS — R2689 Other abnormalities of gait and mobility: Secondary | ICD-10-CM | POA: Diagnosis not present

## 2019-03-26 DIAGNOSIS — M25562 Pain in left knee: Secondary | ICD-10-CM | POA: Diagnosis not present

## 2019-03-26 DIAGNOSIS — M25662 Stiffness of left knee, not elsewhere classified: Secondary | ICD-10-CM

## 2019-03-26 NOTE — Therapy (Signed)
[facility] [address] [place], [place], [address] Phone: [phone]   Fax:  [phone]  Physical Therapy Treatment  Patient Details  Name: Brett [name] MRN: [id number] Date of Birth: 1960/07/15 Referring Provider (PT): Gershon Mussel, MD   Encounter Date: 03/26/2019  PT End of Session - 03/26/19 1217    Visit Number  14    Number of Visits  18    Date for PT Re-Evaluation  03/27/19    PT Start Time  1145    PT Stop Time  1225    PT Time Calculation (min)  40 min    Activity Tolerance  Patient tolerated treatment well    Behavior During Therapy  [facility] for tasks assessed/performed       Past Medical History:  Diagnosis Date  . Allergy    albuterol as needed  . Anal fissure   . Arthritis   . Colon polyps 01/13/11   Colonoscopy  . GERD (gastroesophageal reflux disease)   . Pneumonia   . Pre-diabetes     Past Surgical History:  Procedure Laterality Date  . ANAL FISSURE REPAIR  02/24/11  . CHONDROPLASTY  08/14/2018   Procedure: CHONDROPLASTY;  Surgeon: Tarry Kos, MD;  Location: [facility];  Service: Orthopedics;;  . COLONOSCOPY    . KNEE ARTHROSCOPY WITH MEDIAL MENISECTOMY Left 08/14/2018   Procedure: LEFT KNEE ARTHROSCOPY WITH PARTIAL MEDIAL MENISCECTOMY;  Surgeon: Tarry Kos, MD;  Location: [facility];  Service: Orthopedics;  Laterality: Left;  . KNEE ARTHROSCOPY WITH MEDIAL MENISECTOMY Right 10/18/2018   Procedure: RIGHT KNEE ARTHROSCOPY WITH PARTIAL MEDIAL MENISCECTOMY;  Surgeon: Tarry Kos, MD;  Location: [facility];  Service: Orthopedics;  Laterality: Right;  . TOTAL KNEE ARTHROPLASTY Left 01/24/2019   Procedure: LEFT TOTAL KNEE ARTHROPLASTY;  Surgeon: Tarry Kos, MD;  Location[facility];  Service: Orthopedics;  Laterality: Left;    There were no vitals filed for this visit.  Subjective Assessment - 03/26/19 1147    Subjective  knee is still stiff but overall doing well.     Patient Stated Goals  return to work    Currently in Pain?  Yes    Pain Score  5     Pain Location  Knee    Pain Orientation  Left    Pain Descriptors / Indicators  Aching;Throbbing    Pain Type  Acute pain;Surgical pain    Pain Onset  1 to 4 weeks ago    Pain Frequency  Constant    Aggravating Factors   bending the knee, sleeping, standing/walking for extended periods    Pain Relieving Factors  meds, ice/heat, repositioning                       [facility] Adult PT Treatment/Exercise - 03/26/19 1148      Knee/Hip Exercises: Aerobic   Recumbent Bike  8 min partial to full revolutions seat 7      Knee/Hip Exercises: Standing   SLS  LLE 5x15 sec; light UE support    SLS with Vectors  LLE mini squat: RLE slide out forward and lateral with 1UE support x 5 reps each - decreased quad strength       Knee/Hip Exercises: Seated   Long Arc Quad  Strengthening;Left;Other (comment);3 sets;10 reps    Long Arc Quad Weight  5 lbs.      Vasopneumatic   Number Minutes Vasopneumatic   10 minutes  Vasopnuematic Location   Knee    Vasopneumatic Pressure  Medium    Vasopneumatic Temperature   34                  PT Long Term Goals - 03/24/19 1259      PT LONG TERM GOAL #1   Title  independent with HEP    Status  On-going      PT LONG TERM GOAL #2   Title  improve Lt knee AROM 0-110 for improved function    Status  On-going      PT LONG TERM GOAL #3   Title  amb without AD without gait deviations for improved function and mobility    Status  On-going      PT LONG TERM GOAL #4   Title  report pain < 3/10 with ambulation for improved function    Status  On-going      PT LONG TERM GOAL #5   Title  demonstrate improved quad strength by performing at least 10 reps SLR without quad lag    Status  On-going            Plan - 03/26/19 1219    Clinical Impression Statement  Pt continues to demonstrate decreased quad strength today, and still with elevated  pain levels.  Pain is slowly decreasing per his reports, and will continue to benefit from PT to maximize function.    Stability/Clinical Decision Making  Stable/Uncomplicated    Rehab Potential  Good    PT Frequency  2x / week    PT Duration  6 weeks    PT Treatment/Interventions  ADLs/Self Care Home Management;Cryotherapy;Electrical Stimulation;Iontophoresis 4mg /ml Dexamethasone;Moist Heat;Balance training;Therapeutic exercise;Therapeutic activities;Functional mobility training;Stair training;Gait training;DME Instruction;Neuromuscular re-education;Patient/family education;Manual techniques;Vasopneumatic Device;Taping;Dry needling;Passive range of motion    PT Next Visit Plan  needs recert, check goals, continue PT - plan 2x/wk x 3-4 wks    PT Home Exercise Plan  Access Code: [email or web address]    Consulted and Agree with Plan of Care  Patient       Patient will benefit from skilled therapeutic intervention in order to improve the following deficits and impairments:  Abnormal gait, Pain, Decreased strength, Difficulty walking, Decreased mobility, Impaired flexibility, Decreased range of motion, Increased edema  Visit Diagnosis: Acute pain of left knee  Stiffness of left knee, not elsewhere classified  Other abnormalities of gait and mobility  Muscle weakness (generalized)  Localized edema     Problem List Patient Active Problem List   Diagnosis Date Noted  . Status post total left knee replacement 01/24/2019  . Primary osteoarthritis of left knee 12/17/2018  . Primary osteoarthritis of right knee 12/17/2018  . Complex tear of medial meniscus of right knee as current injury 08/06/2018  . Overweight (BMI 25.0-29.9) 08/08/2017  . Medication management 03/23/2014  . Vitamin D deficiency 03/23/2014  . Abnormal glucose 03/23/2014  . Hyperlipemia 10/31/2006  . GERD 10/31/2006      Laureen Abrahams, PT, DPT 03/26/19 12:21 PM    [facility] [address] [place], [place], [address] Phone: [phone]   Fax:  [phone]  Name: Dolphus Linch MRN: [id number] Date of Birth: 19-Dec-1960

## 2019-03-28 ENCOUNTER — Encounter: Payer: BC Managed Care – PPO | Admitting: Physical Therapy

## 2019-03-28 ENCOUNTER — Other Ambulatory Visit: Payer: Self-pay

## 2019-03-28 ENCOUNTER — Encounter: Payer: Self-pay | Admitting: Physical Therapy

## 2019-03-28 ENCOUNTER — Ambulatory Visit (INDEPENDENT_AMBULATORY_CARE_PROVIDER_SITE_OTHER): Payer: BC Managed Care – PPO | Admitting: Physical Therapy

## 2019-03-28 DIAGNOSIS — R2689 Other abnormalities of gait and mobility: Secondary | ICD-10-CM

## 2019-03-28 DIAGNOSIS — M25562 Pain in left knee: Secondary | ICD-10-CM | POA: Diagnosis not present

## 2019-03-28 DIAGNOSIS — R600 Localized edema: Secondary | ICD-10-CM

## 2019-03-28 DIAGNOSIS — M25662 Stiffness of left knee, not elsewhere classified: Secondary | ICD-10-CM | POA: Diagnosis not present

## 2019-03-28 DIAGNOSIS — M6281 Muscle weakness (generalized): Secondary | ICD-10-CM | POA: Diagnosis not present

## 2019-03-28 NOTE — Therapy (Signed)
[facility] Physical Therapy [address] [place], [place], [address] Phone: [phone]   Fax:  [phone]  Physical Therapy Treatment/Recertification  Patient Details  Name: Brett [name] MRN: [id number] Date of Birth: March 28, 1960 Referring Provider (PT): Frankey Shown, MD   Encounter Date: 03/28/2019  PT End of Session - 03/28/19 1153    Visit Number  15    Number of Visits  27    Date for PT Re-Evaluation  05/09/19    PT Start Time  1058    PT Stop Time  1145    PT Time Calculation (min)  47 min    Activity Tolerance  Patient tolerated treatment well    Behavior During Therapy  [facility] for tasks assessed/performed       Past Medical History:  Diagnosis Date  . Allergy    albuterol as needed  . Anal fissure   . Arthritis   . Colon polyps 01/13/11   Colonoscopy  . GERD (gastroesophageal reflux disease)   . Pneumonia   . Pre-diabetes     Past Surgical History:  Procedure Laterality Date  . ANAL FISSURE REPAIR  02/24/11  . CHONDROPLASTY  08/14/2018   Procedure: CHONDROPLASTY;  Surgeon: Leandrew Koyanagi, MD;  Location: [place];  Service: Orthopedics;;  . COLONOSCOPY    . KNEE ARTHROSCOPY WITH MEDIAL MENISECTOMY Left 08/14/2018   Procedure: LEFT KNEE ARTHROSCOPY WITH PARTIAL MEDIAL MENISCECTOMY;  Surgeon: Leandrew Koyanagi, MD;  Location: [place];  Service: Orthopedics;  Laterality: Left;  . KNEE ARTHROSCOPY WITH MEDIAL MENISECTOMY Right 10/18/2018   Procedure: RIGHT KNEE ARTHROSCOPY WITH PARTIAL MEDIAL MENISCECTOMY;  Surgeon: Leandrew Koyanagi, MD;  Location: [place];  Service: Orthopedics;  Laterality: Right;  . TOTAL KNEE ARTHROPLASTY Left 01/24/2019   Procedure: LEFT TOTAL KNEE ARTHROPLASTY;  Surgeon: Leandrew Koyanagi, MD;  Location: [place];  Service: Orthopedics;  Laterality: Left;    There were no vitals filed for this visit.  Subjective Assessment - 03/28/19 1105    Subjective  knee is hurting a little  more; having bucking episodes and doesn't feel comfortable walking with cane.    Patient Stated Goals  return to work    Currently in Pain?  Yes    Pain Score  6     Pain Location  Knee    Pain Orientation  Left    Pain Descriptors / Indicators  Aching;Throbbing    Pain Type  Acute pain;Surgical pain    Pain Onset  1 to 4 weeks ago    Pain Frequency  Constant    Aggravating Factors   bending the knee, sleeping, standing/walking for extended periods    Pain Relieving Factors  meds, ice/heat, repositioning         [facility] PT Assessment - 03/28/19 1155      Assessment   Medical Diagnosis  Left TKA    Referring Provider (PT)  Frankey Shown, MD    Onset Date/Surgical Date  01/24/19    Next MD Visit  04/24/19      AROM   Left Knee Extension  0    Left Knee Flexion  110      Strength   Left Knee Extension  2+/5   ~ 5-10 deg quad lag present                  [facility] Adult PT Treatment/Exercise - 03/28/19 1108      Knee/Hip Exercises: Aerobic   Recumbent Bike  8 min partial to full revolutions seat 7      Knee/Hip Exercises: Supine   Quad Sets  Strengthening;Left;Other (comment)   x5 min with [place]   Short Arc [employer]  Strengthening;Left   x 5 min with [place]   Straight Leg Raises  Left;Other (comment)   x 5 min with [place]; ~10 deg quad lag present     Patent attorney Parameters  up to 11 mHz intensity - pain limiting increase in intensity, none to min quad contraction noted    Electrical Stimulation Goals  Strength;Neuromuscular facilitation                  PT Long Term Goals - 03/28/19 1154      PT LONG TERM GOAL #1   Title  independent with HEP    Status  On-going    Target Date  05/09/19      PT LONG TERM GOAL #2   Title  improve Lt knee AROM 0-110 for improved function    Status  Achieved      PT LONG TERM GOAL #3    Title  amb without AD without gait deviations for improved function and mobility    Baseline  3/26: still using crutches or SPC due to quad weakness    Status  On-going    Target Date  05/09/19      PT LONG TERM GOAL #4   Title  report pain < 3/10 with ambulation for improved function    Baseline  3/26: elevated 4-6/10 still at this time    Status  On-going    Target Date  05/09/19      PT LONG TERM GOAL #5   Title  demonstrate improved quad strength by performing at least 10 reps SLR without quad lag    Baseline  3/26: ~ 5-10 deg present    Status  On-going    Target Date  05/09/19            Plan - 03/28/19 1156    Clinical Impression Statement  Pt has met ROM goal for physical therapy at this time.  His remaining goals are ongoing and will plan to continue these goals for 2x/wk x 6 more weeks.  He continues to be limited by elevated pain levels as well as significant quad atrophy creating episodes of buckling when ambulating.  Will continue to benefit from PT to maximize function.    Stability/Clinical Decision Making  Stable/Uncomplicated    Rehab Potential  Good    PT Frequency  2x / week    PT Duration  6 weeks    PT Treatment/Interventions  ADLs/Self Care Home Management;Cryotherapy;Electrical Stimulation;Iontophoresis 53m/ml Dexamethasone;Moist Heat;Balance training;Therapeutic exercise;Therapeutic activities;Functional mobility training;Stair training;Gait training;DME Instruction;Neuromuscular re-education;Patient/family education;Manual techniques;Vasopneumatic Device;Taping;Dry needling;Passive range of motion    PT Next Visit Plan  continue with quad strengthening focus    PT Home Exercise Plan  Access Code: [email or web address]   Consulted and Agree with Plan of Care  Patient       Patient will benefit from skilled therapeutic intervention in order to improve the following deficits and impairments:  Abnormal gait, Pain, Decreased strength, Difficulty walking, Decreased  mobility, Impaired flexibility, Decreased range of motion, Increased edema  Visit Diagnosis: Acute pain of left knee - Plan: PT plan of care cert/re-cert  Stiffness of left knee, not elsewhere classified -  Plan: PT plan of care cert/re-cert  Other abnormalities of gait and mobility - Plan: PT plan of care cert/re-cert  Muscle weakness (generalized) - Plan: PT plan of care cert/re-cert  Localized edema - Plan: PT plan of care cert/re-cert     Problem List Patient Active Problem List   Diagnosis Date Noted  . Status post total left knee replacement 01/24/2019  . Primary osteoarthritis of left knee 12/17/2018  . Primary osteoarthritis of right knee 12/17/2018  . Complex tear of medial meniscus of right knee as current injury 08/06/2018  . Overweight (BMI 25.0-29.9) 08/08/2017  . Medication management 03/23/2014  . Vitamin D deficiency 03/23/2014  . Abnormal glucose 03/23/2014  . Hyperlipemia 10/31/2006  . GERD 10/31/2006      Laureen Abrahams, PT, DPT 03/28/19 12:00 PM     [facility] [address] [place], [place], [address] Phone: [phone]   Fax:  [phone]  Name: Steven Basso MRN: [id number] Date of Birth: [birth year]

## 2019-03-31 ENCOUNTER — Encounter: Payer: BC Managed Care – PPO | Admitting: Rehabilitative and Restorative Service Providers"

## 2019-04-01 ENCOUNTER — Other Ambulatory Visit: Payer: Self-pay

## 2019-04-01 ENCOUNTER — Encounter: Payer: Self-pay | Admitting: Rehabilitative and Restorative Service Providers"

## 2019-04-01 ENCOUNTER — Ambulatory Visit (INDEPENDENT_AMBULATORY_CARE_PROVIDER_SITE_OTHER): Payer: BC Managed Care – PPO | Admitting: Rehabilitative and Restorative Service Providers"

## 2019-04-01 DIAGNOSIS — R2689 Other abnormalities of gait and mobility: Secondary | ICD-10-CM

## 2019-04-01 DIAGNOSIS — M25562 Pain in left knee: Secondary | ICD-10-CM

## 2019-04-01 DIAGNOSIS — M25662 Stiffness of left knee, not elsewhere classified: Secondary | ICD-10-CM | POA: Diagnosis not present

## 2019-04-01 DIAGNOSIS — M6281 Muscle weakness (generalized): Secondary | ICD-10-CM | POA: Diagnosis not present

## 2019-04-01 DIAGNOSIS — R600 Localized edema: Secondary | ICD-10-CM

## 2019-04-01 NOTE — Therapy (Signed)
[place], [place], [address] Phone: [phone]   Fax:  [phone]  Physical Therapy Treatment  Patient Details  Name: Brett [name] MRN: [id number] Date of Birth: 1960/11/09 Referring Provider (PT): Frankey Shown, MD   Encounter Date: 04/01/2019  PT End of Session - 04/01/19 1426    Visit Number  16    Number of Visits  27    Date for PT Re-Evaluation  05/09/19    PT Start Time  1400    PT Stop Time  1450    PT Time Calculation (min)  50 min    Activity Tolerance  Patient tolerated treatment well    Behavior During Therapy  [facility] for tasks assessed/performed       Past Medical History:  Diagnosis Date  . Allergy    albuterol as needed  . Anal fissure   . Arthritis   . Colon polyps 01/13/11   Colonoscopy  . GERD (gastroesophageal reflux disease)   . Pneumonia   . Pre-diabetes     Past Surgical History:  Procedure Laterality Date  . ANAL FISSURE REPAIR  02/24/11  . CHONDROPLASTY  08/14/2018   Procedure: CHONDROPLASTY;  Surgeon: Leandrew Koyanagi, MD;  Location: [place];  Service: Orthopedics;;  . COLONOSCOPY    . KNEE ARTHROSCOPY WITH MEDIAL MENISECTOMY Left 08/14/2018   Procedure: LEFT KNEE ARTHROSCOPY WITH PARTIAL MEDIAL MENISCECTOMY;  Surgeon: Leandrew Koyanagi, MD;  Location: [place];  Service: Orthopedics;  Laterality: Left;  . KNEE ARTHROSCOPY WITH MEDIAL MENISECTOMY Right 10/18/2018   Procedure: RIGHT KNEE ARTHROSCOPY WITH PARTIAL MEDIAL MENISCECTOMY;  Surgeon: Leandrew Koyanagi, MD;  Location: [place];  Service: Orthopedics;  Laterality: Right;  . TOTAL KNEE ARTHROPLASTY Left 01/24/2019   Procedure: LEFT TOTAL KNEE ARTHROPLASTY;  Surgeon: Leandrew Koyanagi, MD;  Location: [place];  Service: Orthopedics;  Laterality: Left;    There were no vitals filed for this visit.  Subjective Assessment - 04/01/19 1415    Subjective  Pt. reported feeling less sharp pains at  night but still feeling lateral knee pains consistently throughout day in various levels of severity.    Pertinent History  OA    Limitations  Standing;Walking    Patient Stated Goals  return to work    Currently in Pain?  Yes    Pain Score  5     Pain Location  Knee    Pain Orientation  Left    Pain Descriptors / Indicators  Aching;Throbbing    Pain Type  Acute pain;Surgical pain    Pain Onset  1 to 4 weeks ago    Pain Frequency  Constant    Aggravating Factors   walking, standing, squats, staying still while sleeping.    Pain Relieving Factors  rest                       [facility] Adult PT Treatment/Exercise - 04/01/19 0001      Knee/Hip Exercises: Stretches   Gastroc Stretch  3 reps;30 seconds      Knee/Hip Exercises: Aerobic   Nustep  L4 5 mins c BFR 110 mmHg at 50% LOP, L4 x 5 no BFR      Knee/Hip Exercises: Seated   Long Arc Quad  Strengthening;Left   c BFR at 165 mmHg (75% LOP).  1 set 30x, x 15, x 15, x 15   Long Arc Quad Weight  2 lbs.  Knee/Hip Exercises: Supine   Straight Leg Raises  Left;Strengthening   c BFR at 165 mmHg (75% LOP).  1 set 30x, x 12, x 15, x 10     Vasopneumatic   Number Minutes Vasopneumatic   10 minutes    Vasopnuematic Location   Knee    Vasopneumatic Pressure  Medium    Vasopneumatic Temperature   34             PT Education - 04/01/19 1416    Education Details  BFR education, cues for intervention techniques.    Person(s) Educated  Patient    Methods  Explanation    Comprehension  Verbalized understanding;Returned demonstration          PT Long Term Goals - 03/28/19 1154      PT LONG TERM GOAL #1   Title  independent with HEP    Status  On-going    Target Date  05/09/19      PT LONG TERM GOAL #2   Title  improve Lt knee AROM 0-110 for improved function    Status  Achieved      PT LONG TERM GOAL #3   Title  amb without AD without gait deviations for improved function and mobility    Baseline  3/26:  still using crutches or SPC due to quad weakness    Status  On-going    Target Date  05/09/19      PT LONG TERM GOAL #4   Title  report pain < 3/10 with ambulation for improved function    Baseline  3/26: elevated 4-6/10 still at this time    Status  On-going    Target Date  05/09/19      PT LONG TERM GOAL #5   Title  demonstrate improved quad strength by performing at least 10 reps SLR without quad lag    Baseline  3/26: ~ 5-10 deg present    Status  On-going    Target Date  05/09/19            Plan - 04/01/19 1428    Clinical Impression Statement  Screening for BFR was unremarkable today (no history of DVT reported by Pt.).  Successful use c SLR and LAQ.  Continued strength training c WB transition and movement coordination training as tolerated indicated.    Stability/Clinical Decision Making  Stable/Uncomplicated    Rehab Potential  Good    PT Frequency  2x / week    PT Duration  6 weeks    PT Treatment/Interventions  ADLs/Self Care Home Management;Cryotherapy;Electrical Stimulation;Iontophoresis 4mg /ml Dexamethasone;Moist Heat;Balance training;Therapeutic exercise;Therapeutic activities;Functional mobility training;Stair training;Gait training;DME Instruction;Neuromuscular re-education;Patient/family education;Manual techniques;Vasopneumatic Device;Taping;Dry needling;Passive range of motion    PT Next Visit Plan  BFR therapy as appropriate.    PT Home Exercise Plan  Access Code: [email or web address]    Consulted and Agree with Plan of Care  Patient       Patient will benefit from skilled therapeutic intervention in order to improve the following deficits and impairments:  Abnormal gait, Pain, Decreased strength, Difficulty walking, Decreased mobility, Impaired flexibility, Decreased range of motion, Increased edema  Visit Diagnosis: Acute pain of left knee  Stiffness of left knee, not elsewhere classified  Other abnormalities of gait and mobility  Muscle weakness  (generalized)  Localized edema     Problem List Patient Active Problem List   Diagnosis Date Noted  . Status post total left knee replacement 01/24/2019  . Primary osteoarthritis of left knee 12/17/2018  .  Primary osteoarthritis of right knee 12/17/2018  . Complex tear of medial meniscus of right knee as current injury 08/06/2018  . Overweight (BMI 25.0-29.9) 08/08/2017  . Medication management 03/23/2014  . Vitamin D deficiency 03/23/2014  . Abnormal glucose 03/23/2014  . Hyperlipemia 10/31/2006  . GERD 10/31/2006    Chyrel Masson, PT, DPT, OCS, ATC 04/01/19  2:40 PM    [facility] Therapy [address] [place], [place], [address] Phone: [phone]   Fax:  [phone]  Name: Brett [name] MRN: [id number] Date of Birth: [birth year]

## 2019-04-02 ENCOUNTER — Encounter: Payer: Self-pay | Admitting: Physical Therapy

## 2019-04-02 ENCOUNTER — Ambulatory Visit (INDEPENDENT_AMBULATORY_CARE_PROVIDER_SITE_OTHER): Payer: BC Managed Care – PPO | Admitting: Physical Therapy

## 2019-04-02 DIAGNOSIS — M25662 Stiffness of left knee, not elsewhere classified: Secondary | ICD-10-CM | POA: Diagnosis not present

## 2019-04-02 DIAGNOSIS — R2689 Other abnormalities of gait and mobility: Secondary | ICD-10-CM | POA: Diagnosis not present

## 2019-04-02 DIAGNOSIS — M6281 Muscle weakness (generalized): Secondary | ICD-10-CM | POA: Diagnosis not present

## 2019-04-02 DIAGNOSIS — R600 Localized edema: Secondary | ICD-10-CM

## 2019-04-02 DIAGNOSIS — M25562 Pain in left knee: Secondary | ICD-10-CM | POA: Diagnosis not present

## 2019-04-02 NOTE — Therapy (Signed)
[facility] [address] [place], [place], [address] Phone: [phone]   Fax:  [phone]  Physical Therapy Treatment  Patient Details  Name: Brett [name] MRN: [id number] Date of Birth: 1960/12/21 Referring Provider (PT): Gershon Mussel, MD   Encounter Date: 04/02/2019  PT End of Session - 04/02/19 1236    Visit Number  17    Number of Visits  27    Date for PT Re-Evaluation  05/09/19    PT Start Time  1146    PT Stop Time  1240    PT Time Calculation (min)  54 min    Activity Tolerance  Patient tolerated treatment well    Behavior During Therapy  [facility] for tasks assessed/performed       Past Medical History:  Diagnosis Date  . Allergy    albuterol as needed  . Anal fissure   . Arthritis   . Colon polyps 01/13/11   Colonoscopy  . GERD (gastroesophageal reflux disease)   . Pneumonia   . Pre-diabetes     Past Surgical History:  Procedure Laterality Date  . ANAL FISSURE REPAIR  02/24/11  . CHONDROPLASTY  08/14/2018   Procedure: CHONDROPLASTY;  Surgeon: Tarry Kos, MD;  Location: [facility];  Service: Orthopedics;;  . COLONOSCOPY    . KNEE ARTHROSCOPY WITH MEDIAL MENISECTOMY Left 08/14/2018   Procedure: LEFT KNEE ARTHROSCOPY WITH PARTIAL MEDIAL MENISCECTOMY;  Surgeon: Tarry Kos, MD;  Location: [facility];  Service: Orthopedics;  Laterality: Left;  . KNEE ARTHROSCOPY WITH MEDIAL MENISECTOMY Right 10/18/2018   Procedure: RIGHT KNEE ARTHROSCOPY WITH PARTIAL MEDIAL MENISCECTOMY;  Surgeon: Tarry Kos, MD;  Location: [facility];  Service: Orthopedics;  Laterality: Right;  . TOTAL KNEE ARTHROPLASTY Left 01/24/2019   Procedure: LEFT TOTAL KNEE ARTHROPLASTY;  Surgeon: Tarry Kos, MD;  [facility];  Service: Orthopedics;  Laterality: Left;    There were no vitals filed for this visit.  Subjective Assessment - 04/02/19 1151    Subjective  BFR was painful yesterday, would like to hold  off for today.    Pertinent History  OA    Limitations  Standing;Walking    Patient Stated Goals  return to work    Currently in Pain?  Yes    Pain Score  5     Pain Location  Knee    Pain Orientation  Left    Pain Descriptors / Indicators  Aching;Sore    Pain Type  Acute pain;Surgical pain    Pain Onset  1 to 4 weeks ago    Pain Frequency  Constant    Aggravating Factors   walking, standing, squats    Pain Relieving Factors  rest                       [facility] Adult PT Treatment/Exercise - 04/02/19 1152      Knee/Hip Exercises: Aerobic   Recumbent Bike  8 min partial to full revolutions seat 7      Knee/Hip Exercises: Standing   Forward Step Up  Left;10 reps;Hand Hold: 1;Step Height: 6"    Forward Step Up Limitations  working to decrease UE support; down with RLE - focus on LLE eccentric control    Step Down  Left;10 reps;Hand Hold: 2;Step Height: 6"    Step Down Limitations  working to decrease UE support      Knee/Hip Exercises: Seated   Long [employer]  Strengthening;Left;3  sets;10 reps    Long Arc Quad Weight  3 lbs.      Knee/Hip Exercises: Supine   Short Arc Quad Sets  Left;3 sets;10 reps   3#   Straight Leg Raises  Left;3 sets;10 reps   3#     Vasopneumatic   Number Minutes Vasopneumatic   10 minutes    Vasopnuematic Location   Knee    Vasopneumatic Pressure  Medium    Vasopneumatic Temperature   34                  PT Long Term Goals - 03/28/19 1154      PT LONG TERM GOAL #1   Title  independent with HEP    Status  On-going    Target Date  05/09/19      PT LONG TERM GOAL #2   Title  improve Lt knee AROM 0-110 for improved function    Status  Achieved      PT LONG TERM GOAL #3   Title  amb without AD without gait deviations for improved function and mobility    Baseline  3/26: still using crutches or SPC due to quad weakness    Status  On-going    Target Date  05/09/19      PT LONG TERM GOAL #4   Title  report pain < 3/10  with ambulation for improved function    Baseline  3/26: elevated 4-6/10 still at this time    Status  On-going    Target Date  05/09/19      PT LONG TERM GOAL #5   Title  demonstrate improved quad strength by performing at least 10 reps SLR without quad lag    Baseline  3/26: ~ 5-10 deg present    Status  On-going    Target Date  05/09/19            Plan - 04/02/19 1237    Clinical Impression Statement  Pt declined BFR today, only initiated yesterday and willing to consider for next week.  Session today focused on quad activation and strengthening.  Will continue to benefit from PT to maximize function.    Stability/Clinical Decision Making  Stable/Uncomplicated    Rehab Potential  Good    PT Frequency  2x / week    PT Duration  6 weeks    PT Treatment/Interventions  ADLs/Self Care Home Management;Cryotherapy;Electrical Stimulation;Iontophoresis 4mg /ml Dexamethasone;Moist Heat;Balance training;Therapeutic exercise;Therapeutic activities;Functional mobility training;Stair training;Gait training;DME Instruction;Neuromuscular re-education;Patient/family education;Manual techniques;Vasopneumatic Device;Taping;Dry needling;Passive range of motion    PT Next Visit Plan  BFR therapy as appropriate; continue strengtheing    PT Home Exercise Plan  Access Code:    Consulted and Agree with Plan of Care  Patient       Patient will benefit from skilled therapeutic intervention in order to improve the following deficits and impairments:  Abnormal gait, Pain, Decreased strength, Difficulty walking, Decreased mobility, Impaired flexibility, Decreased range of motion, Increased edema  Visit Diagnosis: Acute pain of left knee  Stiffness of left knee, not elsewhere classified  Other abnormalities of gait and mobility  Muscle weakness (generalized)  Localized edema     Problem List Patient Active Problem List   Diagnosis Date Noted  . Status post total left knee replacement  01/24/2019  . Primary osteoarthritis of left knee 12/17/2018  . Primary osteoarthritis of right knee 12/17/2018  . Complex tear of medial meniscus of right knee as current injury 08/06/2018  . Overweight (BMI  25.0-29.9) 08/08/2017  . Medication management 03/23/2014  . Vitamin D deficiency 03/23/2014  . Abnormal glucose 03/23/2014  . Hyperlipemia 10/31/2006  . GERD 10/31/2006        Laureen Abrahams, PT, DPT 04/02/19 12:40 PM     [facility] [address] [place], [place], [address] Phone: [phone]   Fax:  [phone]  Name: Brett [name] MRN: [id number] Date of Birth: 07-07-1960

## 2019-04-08 ENCOUNTER — Other Ambulatory Visit: Payer: Self-pay

## 2019-04-08 ENCOUNTER — Encounter: Payer: Self-pay | Admitting: Rehabilitative and Restorative Service Providers"

## 2019-04-08 ENCOUNTER — Ambulatory Visit (INDEPENDENT_AMBULATORY_CARE_PROVIDER_SITE_OTHER): Payer: BC Managed Care – PPO | Admitting: Rehabilitative and Restorative Service Providers"

## 2019-04-08 DIAGNOSIS — M6281 Muscle weakness (generalized): Secondary | ICD-10-CM

## 2019-04-08 DIAGNOSIS — M25662 Stiffness of left knee, not elsewhere classified: Secondary | ICD-10-CM | POA: Diagnosis not present

## 2019-04-08 DIAGNOSIS — M25562 Pain in left knee: Secondary | ICD-10-CM | POA: Diagnosis not present

## 2019-04-08 DIAGNOSIS — R2689 Other abnormalities of gait and mobility: Secondary | ICD-10-CM | POA: Diagnosis not present

## 2019-04-08 DIAGNOSIS — R600 Localized edema: Secondary | ICD-10-CM

## 2019-04-08 NOTE — Therapy (Signed)
[facility] [address] [place], [place], [address] Phone: [phone]   Fax:  [phone]  Physical Therapy Treatment  Patient Details  Name: Brett [name] MRN: [id number] Date of Birth: [birth year] Referring Provider (PT): Gershon Mussel, MD   Encounter Date: 04/08/2019  PT End of Session - 04/08/19 1211    Visit Number  18    Number of Visits  27    Date for PT Re-Evaluation  05/09/19    PT Start Time  1145    PT Stop Time  1235    PT Time Calculation (min)  50 min    Activity Tolerance  Patient limited by pain    Behavior During Therapy  [facility] for tasks assessed/performed       Past Medical History:  Diagnosis Date  . Allergy    albuterol as needed  . Anal fissure   . Arthritis   . Colon polyps 01/13/11   Colonoscopy  . GERD (gastroesophageal reflux disease)   . Pneumonia   . Pre-diabetes     Past Surgical History:  Procedure Laterality Date  . ANAL FISSURE REPAIR  02/24/11  . CHONDROPLASTY  08/14/2018   Procedure: CHONDROPLASTY;  Surgeon: Tarry Kos, MD;  Location: [facility];  Service: Orthopedics;;  . COLONOSCOPY    . KNEE ARTHROSCOPY WITH MEDIAL MENISECTOMY Left 08/14/2018   Procedure: LEFT KNEE ARTHROSCOPY WITH PARTIAL MEDIAL MENISCECTOMY;  Surgeon: Tarry Kos, MD;  Location: [facility];  Service: Orthopedics;  Laterality: Left;  . KNEE ARTHROSCOPY WITH MEDIAL MENISECTOMY Right 10/18/2018   Procedure: RIGHT KNEE ARTHROSCOPY WITH PARTIAL MEDIAL MENISCECTOMY;  Surgeon: Tarry Kos, MD;  Location: [facility];  Service: Orthopedics;  Laterality: Right;  . TOTAL KNEE ARTHROPLASTY Left 01/24/2019   Procedure: LEFT TOTAL KNEE ARTHROPLASTY;  Surgeon: Tarry Kos, MD;  [facility];  Service: Orthopedics;  Laterality: Left;    There were no vitals filed for this visit.  Subjective Assessment - 04/08/19 1206    Subjective  Pt. stated he was feeling some return of resting pain  overall.  Pt. stated he didn't feel pain from BFR and exercises in his knee, just muscle fatigue complaints.  Pt. stated moderate pain in knee upon arrival today, some tightness increased.    Pertinent History  OA    Limitations  Standing;Walking    Patient Stated Goals  return to work    Currently in Pain?  Yes    Pain Score  5     Pain Location  Knee    Pain Orientation  Left    Pain Descriptors / Indicators  Aching;Sore    Pain Type  Acute pain;Surgical pain    Pain Onset  1 to 4 weeks ago    Pain Frequency  Constant    Aggravating Factors   walking, standing, sleeping at night.    Pain Relieving Factors  rest sometimes help, sometimes light movement.                       [facility] Adult PT Treatment/Exercise - 04/08/19 0001      Knee/Hip Exercises: Aerobic   Recumbent Bike  10 mins partial to full revs      Knee/Hip Exercises: Machines for Strengthening   Total Gym Leg Press  SL 25 lbs shuttle board   c BFR at 165 mmHG, 30x, 8, 12, 15 c 30 second rest breaks     Knee/Hip Exercises: Seated  Long Arc [employer];Other (comment)   c BFR at 165 mmHG, 25x, 15, 15, 15 c 30 second rest breaks   Long Arc Quad Weight  2 lbs.      Knee/Hip Exercises: Supine   Straight Leg Raises  Left;Other (comment)   c BFR 165 mmHG, 25x, 13, 15, 15 c 30 second rest breaks     Vasopneumatic   Number Minutes Vasopneumatic   10 minutes    Vasopnuematic Location   Knee    Vasopneumatic Pressure  Medium    Vasopneumatic Temperature   34             PT Education - 04/08/19 1209    Education Details  BFR review, discussion on relationship between pain and muscle weakness in functional activity.    Person(s) Educated  Patient    Methods  Explanation    Comprehension  Verbalized understanding          PT Long Term Goals - 03/28/19 1154      PT LONG TERM GOAL #1   Title  independent with HEP    Status  On-going    Target Date  05/09/19      PT LONG TERM  GOAL #2   Title  improve Lt knee AROM 0-110 for improved function    Status  Achieved      PT LONG TERM GOAL #3   Title  amb without AD without gait deviations for improved function and mobility    Baseline  3/26: still using crutches or SPC due to quad weakness    Status  On-going    Target Date  05/09/19      PT LONG TERM GOAL #4   Title  report pain < 3/10 with ambulation for improved function    Baseline  3/26: elevated 4-6/10 still at this time    Status  On-going    Target Date  05/09/19      PT LONG TERM GOAL #5   Title  demonstrate improved quad strength by performing at least 10 reps SLR without quad lag    Baseline  3/26: ~ 5-10 deg present    Status  On-going    Target Date  05/09/19            Plan - 04/08/19 1209    Clinical Impression Statement  Pt. tolerated BFR better today than first attempt, able to push himself to fatigue and demonstrate almost full extension in SLR, knee extension in LAQ, lacking approx. 5 deg today.   Overall presentation and functional impairments tied specifically to pain symptoms and quad control/strength.    Stability/Clinical Decision Making  Stable/Uncomplicated    Rehab Potential  Good    PT Frequency  2x / week    PT Duration  6 weeks    PT Treatment/Interventions  ADLs/Self Care Home Management;Cryotherapy;Electrical Stimulation;Iontophoresis 4mg /ml Dexamethasone;Moist Heat;Balance training;Therapeutic exercise;Therapeutic activities;Functional mobility training;Stair training;Gait training;DME Instruction;Neuromuscular re-education;Patient/family education;Manual techniques;Vasopneumatic Device;Taping;Dry needling;Passive range of motion    PT Next Visit Plan  BFR therapy as appropriate; continue strength and balance activity    PT Home Exercise Plan  Access Code:    Consulted and Agree with Plan of Care  Patient       Patient will benefit from skilled therapeutic intervention in order to improve the following deficits  and impairments:  Abnormal gait, Pain, Decreased strength, Difficulty walking, Decreased mobility, Impaired flexibility, Decreased range of motion, Increased edema  Visit Diagnosis: Acute pain of left knee  Stiffness of left knee, not elsewhere classified  Other abnormalities of gait and mobility  Muscle weakness (generalized)  Localized edema     Problem List Patient Active Problem List   Diagnosis Date Noted  . Status post total left knee replacement 01/24/2019  . Primary osteoarthritis of left knee 12/17/2018  . Primary osteoarthritis of right knee 12/17/2018  . Complex tear of medial meniscus of right knee as current injury 08/06/2018  . Overweight (BMI 25.0-29.9) 08/08/2017  . Medication management 03/23/2014  . Vitamin D deficiency 03/23/2014  . Abnormal glucose 03/23/2014  . Hyperlipemia 10/31/2006  . GERD 10/31/2006   [name], PT, DPT, OCS, ATC 04/08/19  12:30 PM    [facility] [address] [place], [place], [address] Phone: [phone]   Fax:  [phone]  Name: Kyler Germer MRN: [id number] Date of Birth: October 07, 1960

## 2019-04-10 ENCOUNTER — Other Ambulatory Visit: Payer: Self-pay

## 2019-04-10 ENCOUNTER — Ambulatory Visit (INDEPENDENT_AMBULATORY_CARE_PROVIDER_SITE_OTHER): Payer: BC Managed Care – PPO | Admitting: Rehabilitative and Restorative Service Providers"

## 2019-04-10 ENCOUNTER — Encounter: Payer: Self-pay | Admitting: Rehabilitative and Restorative Service Providers"

## 2019-04-10 DIAGNOSIS — M25662 Stiffness of left knee, not elsewhere classified: Secondary | ICD-10-CM

## 2019-04-10 DIAGNOSIS — M6281 Muscle weakness (generalized): Secondary | ICD-10-CM

## 2019-04-10 DIAGNOSIS — R2689 Other abnormalities of gait and mobility: Secondary | ICD-10-CM

## 2019-04-10 DIAGNOSIS — M25562 Pain in left knee: Secondary | ICD-10-CM | POA: Diagnosis not present

## 2019-04-10 DIAGNOSIS — R600 Localized edema: Secondary | ICD-10-CM

## 2019-04-10 NOTE — Therapy (Addendum)
[facility] [address] [place], [place], [address] Phone: [phone]   Fax:  [phone]  Physical Therapy Treatment/Discharge Summary  Patient Details  Name: Brett [name] MRN: [id number] Date of Birth: September 04, 1960 Referring Provider (PT): Frankey Shown, MD   Encounter Date: 04/10/2019  PT End of Session - 04/10/19 1220    Visit Number  19    Number of Visits  27    Date for PT Re-Evaluation  05/09/19    PT Start Time  1448    PT Stop Time  1856    PT Time Calculation (min)  50 min    Activity Tolerance  Patient limited by pain    Behavior During Therapy  [facility] for tasks assessed/performed       Past Medical History:  Diagnosis Date  . Allergy    albuterol as needed  . Anal fissure   . Arthritis   . Colon polyps 01/13/11   Colonoscopy  . GERD (gastroesophageal reflux disease)   . Pneumonia   . Pre-diabetes     Past Surgical History:  Procedure Laterality Date  . ANAL FISSURE REPAIR  02/24/11  . CHONDROPLASTY  08/14/2018   Procedure: CHONDROPLASTY;  Surgeon: Leandrew Koyanagi, MD;  Location: [place];  Service: Orthopedics;;  . COLONOSCOPY    . KNEE ARTHROSCOPY WITH MEDIAL MENISECTOMY Left 08/14/2018   Procedure: LEFT KNEE ARTHROSCOPY WITH PARTIAL MEDIAL MENISCECTOMY;  Surgeon: Leandrew Koyanagi, MD;  Location: [place];  Service: Orthopedics;  Laterality: Left;  . KNEE ARTHROSCOPY WITH MEDIAL MENISECTOMY Right 10/18/2018   Procedure: RIGHT KNEE ARTHROSCOPY WITH PARTIAL MEDIAL MENISCECTOMY;  Surgeon: Leandrew Koyanagi, MD;  Location: [place];  Service: Orthopedics;  Laterality: Right;  . TOTAL KNEE ARTHROPLASTY Left 01/24/2019   Procedure: LEFT TOTAL KNEE ARTHROPLASTY;  Surgeon: Leandrew Koyanagi, MD;  Location: [place];  Service: Orthopedics;  Laterality: Left;    There were no vitals filed for this visit.  Subjective Assessment - 04/10/19 1219    Subjective  Pt. indicated muscle soreness for day  but better today.  Pain rated at 5/10 or so.  Clicking noted in knee flexion at times.    Pertinent History  OA    Limitations  Standing;Walking    Patient Stated Goals  return to work    Currently in Pain?  Yes    Pain Location  Knee    Pain Orientation  Left    Pain Descriptors / Indicators  Aching    Pain Type  Acute pain;Surgical pain    Pain Onset  1 to 4 weeks ago    Pain Frequency  Constant                       [facility] Adult PT Treatment/Exercise - 04/10/19 0001      Neuro Re-ed    Neuro Re-ed Details   retro step 30 x Lt LE posterior, church pew anterior/posterior shift 20 x      Knee/Hip Exercises: Stretches   Gastroc Stretch  3 reps   15 seconds     Knee/Hip Exercises: Aerobic   Recumbent Bike  3 mins reverse revolution, 3 mins forward lvl 1      Knee/Hip Exercises: Machines for Strengthening   Total Gym Leg Press  SL 25 lbs shuttle board   BFR at 165 mmHG, 30x, 15, 15, 15 c 30 second rest breaks     Knee/Hip Exercises: Seated  Long Arc [employer];Other (comment)   BFR at 165 mmHG 30, 15, 15, 15 c 30 sec breaks   Long Arc Quad Weight  --   2.5     Vasopneumatic   Number Minutes Vasopneumatic   10 minutes    Vasopnuematic Location   Knee    Vasopneumatic Pressure  Medium    Vasopneumatic Temperature   34      Manual Therapy   Manual therapy comments  Runner's stretch at wall c femoral IR/posterior glides g3 15 sec  bouts             PT Education - 04/10/19 1219    Education Details  New HEP including retro step, church pew weight shift and gastroc stretch at wall    Person(s) Educated  Patient    Methods  Explanation;Verbal cues;Demonstration    Comprehension  Verbalized understanding;Returned demonstration          PT Long Term Goals - 03/28/19 1154      PT LONG TERM GOAL #1   Title  independent with HEP    Status  On-going    Target Date  05/09/19      PT LONG TERM GOAL #2   Title  improve Lt knee AROM  0-110 for improved function    Status  Achieved      PT LONG TERM GOAL #3   Title  amb without AD without gait deviations for improved function and mobility    Baseline  3/26: still using crutches or SPC due to quad weakness    Status  On-going    Target Date  05/09/19      PT LONG TERM GOAL #4   Title  report pain < 3/10 with ambulation for improved function    Baseline  3/26: elevated 4-6/10 still at this time    Status  On-going    Target Date  05/09/19      PT LONG TERM GOAL #5   Title  demonstrate improved quad strength by performing at least 10 reps SLR without quad lag    Baseline  3/26: ~ 5-10 deg present    Status  On-going    Target Date  05/09/19            Plan - 04/10/19 1224    Clinical Impression Statement  Adjusted HEP to incorporate more reactionary involuntary quad activation in WB activity c good success in clinic today.  Pt. progressing in BFR strength training c improved endurance.  Continued ambulation c SPC at this time.    Stability/Clinical Decision Making  Stable/Uncomplicated    Rehab Potential  Good    PT Frequency  2x / week    PT Duration  6 weeks    PT Treatment/Interventions  ADLs/Self Care Home Management;Cryotherapy;Electrical Stimulation;Iontophoresis 17m/ml Dexamethasone;Moist Heat;Balance training;Therapeutic exercise;Therapeutic activities;Functional mobility training;Stair training;Gait training;DME Instruction;Neuromuscular re-education;Patient/family education;Manual techniques;Vasopneumatic Device;Taping;Dry needling;Passive range of motion    PT Next Visit Plan  BFR therapy as appropriate; challenge balance control c weight shift in various directions.    PT Home Exercise Plan  Access Code: [email or web address]    Consulted and Agree with Plan of Care  Patient       Patient will benefit from skilled therapeutic intervention in order to improve the following deficits and impairments:  Abnormal gait, Pain, Decreased strength, Difficulty walking,  Decreased mobility, Impaired flexibility, Decreased range of motion, Increased edema  Visit Diagnosis: Acute pain of left knee  Stiffness of left knee, not elsewhere  classified  Other abnormalities of gait and mobility  Muscle weakness (generalized)  Localized edema     Problem List Patient Active Problem List   Diagnosis Date Noted  . Status post total left knee replacement 01/24/2019  . Primary osteoarthritis of left knee 12/17/2018  . Primary osteoarthritis of right knee 12/17/2018  . Complex tear of medial meniscus of right knee as current injury 08/06/2018  . Overweight (BMI 25.0-29.9) 08/08/2017  . Medication management 03/23/2014  . Vitamin D deficiency 03/23/2014  . Abnormal glucose 03/23/2014  . Hyperlipemia 10/31/2006  . GERD 10/31/2006   [name], PT, DPT, OCS, ATC 04/10/19  12:27 PM    [facility] [address] [place], [place], [address] Phone: [phone]   Fax:  [phone]  Name: Brett [name] MRN: [id number] Date of Birth: [birth year]     PHYSICAL THERAPY DISCHARGE SUMMARY  Visits from Start of Care: 19  Current functional level related to goals / functional outcomes: See above   Remaining deficits: Unknown, pt did not return   Education / Equipment: HEP  Plan: Patient agrees to discharge.  Patient goals were not met. Patient is being discharged due to not returning since the last visit.  ?????    Laureen Abrahams, PT, DPT 06/10/19 9:41 AM  [facility] [address] [place], [place], [address] Phone: [phone]   Fax:  [phone]

## 2019-04-24 ENCOUNTER — Other Ambulatory Visit: Payer: Self-pay

## 2019-04-24 ENCOUNTER — Encounter: Payer: Self-pay | Admitting: Orthopaedic Surgery

## 2019-04-24 ENCOUNTER — Ambulatory Visit (INDEPENDENT_AMBULATORY_CARE_PROVIDER_SITE_OTHER): Payer: BC Managed Care – PPO | Admitting: Orthopaedic Surgery

## 2019-04-24 VITALS — Ht 66.0 in | Wt 167.0 lb

## 2019-04-24 DIAGNOSIS — Z96652 Presence of left artificial knee joint: Secondary | ICD-10-CM

## 2019-04-24 NOTE — Progress Notes (Signed)
Post-Op Visit Note   Patient: Brett [name]           Date of Birth: 1960-05-23           MRN: [id number] Visit Date: 04/24/2019 PCP: Unk Pinto, MD   Assessment & Plan:  Chief Complaint:  Chief Complaint  Patient presents with  . Left Knee - Follow-up    Left TKA DOS 01/24/2019   Visit Diagnoses:  1. Status post total left knee replacement     Plan: Brett [name] is 3 months status post left total knee replacement.  He is ambulating with a single crutch.  He still feels quadriceps weakness.  He has completed physical therapy and still doing home exercises.  He is taking ibuprofen.  He is still having trouble with stairs.  He has difficulty with stiffness and putting on his shoes.  Surgical scar is fully healed.  Range of motion is 0 to 115degrees without pain.  Collaterals are stable.  From my standpoint I do agree that he needs to continue to work on quadriceps strengthening.  Otherwise he is doing well.  I think that his range of motion will come with some additional time.  We will see him back in 3 months with two-view x-rays of the left knee.  Follow-Up Instructions: Return in about 3 months (around 07/24/2019).   Orders:  No orders of the defined types were placed in this encounter.  No orders of the defined types were placed in this encounter.   Imaging: No results found.  PMFS History: Patient Active Problem List   Diagnosis Date Noted  . Status post total left knee replacement 01/24/2019  . Primary osteoarthritis of left knee 12/17/2018  . Primary osteoarthritis of right knee 12/17/2018  . Complex tear of medial meniscus of right knee as current injury 08/06/2018  . Overweight (BMI 25.0-29.9) 08/08/2017  . Medication management 03/23/2014  . Vitamin D deficiency 03/23/2014  . Abnormal glucose 03/23/2014  . Hyperlipemia 10/31/2006  . GERD 10/31/2006   Past Medical History:  Diagnosis Date  . Allergy    albuterol as needed  . Anal fissure   . Arthritis     . Colon polyps 01/13/11   Colonoscopy  . GERD (gastroesophageal reflux disease)   . Pneumonia   . Pre-diabetes     Family History  Problem Relation Age of Onset  . Rheum arthritis Mother   . Allergies Daughter   . Allergies Sister   . Asthma Daughter     Past Surgical History:  Procedure Laterality Date  . ANAL FISSURE REPAIR  02/24/11  . CHONDROPLASTY  08/14/2018   Procedure: CHONDROPLASTY;  Surgeon: Leandrew Koyanagi, MD;  Location: [place];  Service: Orthopedics;;  . COLONOSCOPY    . KNEE ARTHROSCOPY WITH MEDIAL MENISECTOMY Left 08/14/2018   Procedure: LEFT KNEE ARTHROSCOPY WITH PARTIAL MEDIAL MENISCECTOMY;  Surgeon: Leandrew Koyanagi, MD;  Location: [place];  Service: Orthopedics;  Laterality: Left;  . KNEE ARTHROSCOPY WITH MEDIAL MENISECTOMY Right 10/18/2018   Procedure: RIGHT KNEE ARTHROSCOPY WITH PARTIAL MEDIAL MENISCECTOMY;  Surgeon: Leandrew Koyanagi, MD;  Location: [place];  Service: Orthopedics;  Laterality: Right;  . TOTAL KNEE ARTHROPLASTY Left 01/24/2019   Procedure: LEFT TOTAL KNEE ARTHROPLASTY;  Surgeon: Leandrew Koyanagi, MD;  Location: [place];  Service: Orthopedics;  Laterality: Left;   Social History   Occupational History    Employer: [employer]  Tobacco Use  . Smoking status: Former Smoker  Packs/day: 0.00    Years: 27.00    Pack years: 0.00    Types: Cigarettes    Quit date: 08/20/2018    Years since quitting: 0.6  . Smokeless tobacco: Never Used  Substance and Sexual Activity  . Alcohol use: Yes    Comment: socially  . Drug use: No  . Sexual activity: Not on file

## 2019-06-10 ENCOUNTER — Telehealth: Payer: Self-pay | Admitting: Orthopaedic Surgery

## 2019-06-10 NOTE — Telephone Encounter (Signed)
Pt called wanting to see if we could pull up and print the FMLA records from 08/2018 to give to his job? If so pt would like a call back when it's ready for pick up.   [phone]

## 2019-06-12 NOTE — Telephone Encounter (Signed)
Is this under media tab?  I cannot find anything for 08/2018.

## 2019-06-13 NOTE — Telephone Encounter (Signed)
Advised him I do not see anything for that date. I only see a work note. He states he will contact [employer].

## 2019-06-13 NOTE — Telephone Encounter (Signed)
Yes, it would be in media tab. I didn't seen anything either.

## 2019-07-09 NOTE — Progress Notes (Signed)
Patient ID: Brett [name], male   DOB: 1960-09-03, 59 y.o.   MRN: [id number]  Complete Physical  Assessment and Plan:  Encounter for general adult medical examination with abnormal findings 1 year  Hyperlipidemia, unspecified hyperlipidemia type -     Lipid panel check lipids decrease fatty foods increase activity.   Abnormal glucose -     Hemoglobin A1c - given accucheck meter Discussed disease progression and risks Discussed diet/exercise, weight management and risk modification  Overweight (BMI 25.0-29.9) - long discussion about weight loss, diet, and exercise -recommended diet heavy in fruits and veggies and low in animal meats, cheeses, and dairy products  Vitamin D deficiency -     VITAMIN D 25 Hydroxy (Vit-D Deficiency, Fractures)  Medication management -     CBC with Differential/Platelet -     COMPLETE METABOLIC PANEL WITH GFR -     TSH -     Magnesium  Gastroesophageal reflux disease, esophagitis presence not specified Get back on prilosec, avoid NSAIDS  Hypertension, unspecified type - continue medications, DASH diet, exercise and monitor at home. Call if greater than 130/80.  -     Urinalysis, Routine w reflex microscopic -     Microalbumin / creatinine urine ratio -     EKG 12-Lead  Anemia, unspecified type -     Iron,Total/Total Iron Binding Cap -     Vitamin B12  Premature ejaculation -     citalopram (CELEXA) 20 MG tablet; Take 1 tablet (20 mg total) by mouth daily. -     Testosterone  Anxiety -     citalopram (CELEXA) 20 MG tablet; Take 1 tablet (20 mg total) by mouth daily. Patient states with the pandemic, deaths in his family, and his OA pain, he has had more anxiety, will try celexa for premature ejaculation and anxiety  Anemia, unspecified type -     Iron,Total/Total Iron Binding Cap -     Ferritin  Smoker -     DG Chest 2 View; Future Smoking cessation-  instruction/counseling given, counseled patient on the dangers of tobacco  use, advised patient to stop smoking, and reviewed strategies to maximize success, patient not ready to quit at this time.   Cough -     DG Chest 2 View; Future Cough- multifactorial-  get on PPI, may need to add H2/refer GI. Get on allergy pill, voice rest, suppress cough with OTC sugar free candy and RX med.    smoking advised to stop and need CXR Follow up 1 month.   Screening PSA (prostate specific antigen) -     PSA  Discussed med's effects and SE's. Screening labs and tests as requested with regular follow-up as recommended.  HPI Patient presents for a complete physical.   He is a smoker, started back up due to cOVID, goes off and on, started when he was 16, stopped in 30's and then off and on. Last CXR 2019.  He has had a dry cough at night x several months. Non productive. He is on prilosec 40 mg. He has tried benadryl/fluids that helps some. Worse with lying down.  Can sleep with it.   He is on iburprofen 83m BID.  No sinuses/sneezing/cold symptoms.  Has had COVID vaccines  He lost his mother in law to [place] his mom had COVID and has not recovered well, dad with dementia/DM/Blindenss. Has been stressed, decreased motivation.   S/p left knee arthroscopy and medal menisectomy with Dr. XErlinda Hongon 08/14/2018. He continues to have  pain in his right knee and has an appointment coming up.   Lab Results  Component Value Date   VITAMINB12 700 08/21/2018   BMI is Body mass index is 27.76 kg/m., he is working on diet and exercise. Wt Readings from Last 3 Encounters:  07/10/19 172 lb (78 kg)  04/24/19 167 lb (75.8 kg)  03/10/19 167 lb (75.8 kg)     He is not on cholesterol medication and denies myalgias. His cholesterol is at goal. The cholesterol last visit was:   Lab Results  Component Value Date   CHOL 196 08/21/2018   HDL 61 08/21/2018   LDLCALC 117 (H) 08/21/2018   TRIG 83 08/21/2018   CHOLHDL 3.2 08/21/2018    He has been working on diet and exercise for prediabetes,  sugars have been checking it, he is not on bASA, he is not on ACE/ARB and denies foot ulcerations, nausea, paresthesia of the feet, polydipsia, polyuria, visual disturbances, vomiting and weight loss. Last A1C in the office was:  Lab Results  Component Value Date   HGBA1C 6.2 (H) 01/16/2019    Patient is on Vitamin D supplement, does 5000 IU a day.    Lab Results  Component Value Date   VD25OH 38 08/21/2018    Last PSA was: Lab Results  Component Value Date   PSA 1.23 03/23/2014   Denies BPH symptoms daytime frequency, double voiding, dysuria, hematuria, hesitancy, incontinence, intermittency, nocturia, sensation of incomplete bladder emptying, suprapubic pain, urgency or weak urinary stream.     Current Medications:      Current Outpatient Medications (Analgesics):  .  aspirin EC 81 MG tablet, Take 1 tablet (81 mg total) by mouth 2 (two) times daily. Marland Kitchen  ibuprofen (ADVIL) 800 MG tablet, Take 1 tablet (800 mg total) by mouth every 8 (eight) hours as needed.   Current Outpatient Medications (Other):  .  blood glucose meter kit and supplies, Test blood sugar twice daily or as directed by YOUR provider. .  blood glucose meter kit and supplies, Dispense based insurance preference. E11.22 .  Cholecalciferol (VITAMIN D-3) 125 MCG (5000 UT) TABS, Take 5,000 Units by mouth daily. .  Diclofenac Sodium (PENNSAID) 2 % SOLN, Apply 2 g topically 2 (two) times daily as needed (to affected area). (Patient taking differently: Apply 2 g topically 2 (two) times daily as needed (knee pain.). ) .  docusate sodium (COLACE) 100 MG capsule, Take 100 mg by mouth 2 (two) times daily as needed (constipation). .  gabapentin (NEURONTIN) 600 MG tablet, Take 1/2 to 1 tablet 2 to 3 x /Daily as needed for Pain .  glucose blood test strip, Use as instructed .  methocarbamol (ROBAXIN) 750 MG tablet, Take 1 tablet (750 mg total) by mouth 2 (two) times daily as needed for muscle spasms. Marland Kitchen  omeprazole (PRILOSEC) 40  MG capsule, Take 1 capsule (40 mg total) by mouth daily. .  ondansetron (ZOFRAN) 4 MG tablet, Take 1-2 tablets (4-8 mg total) by mouth every 8 (eight) hours as needed for nausea or vomiting.  Health Maintenance:  Immunization History  Administered Date(s) Administered  . Influenza Inj Mdck Quad With Preservative 10/18/2017  . Influenza Whole 10/31/2006, 11/03/2010, 11/02/2011  . Influenza-Unspecified 01/03/2012  . PPD Test 03/17/2013  . Pneumococcal-Unspecified 10/13/2009  . Td 01/03/2008  . Tdap 01/03/2011   Health Maintenance  Topic Date Due  . COVID-19 Vaccine (1) Never done  . INFLUENZA VACCINE  08/03/2019  . TETANUS/TDAP  01/02/2021  . COLONOSCOPY  01/12/2021  . Hepatitis C Screening  Completed  . HIV Screening  Completed    Colonoscopy 10/2011 CT head 2008  Patient Care Team: Unk Pinto, MD as PCP - General (Internal Medicine) Deneise Lever, MD as Consulting Physician (Pulmonary Disease) Wilford Corner, MD as Consulting Physician (Gastroenterology) Zannie Kehr, MD as Referring Physician (Orthopedic Surgery)  Allergies:  Allergies  Allergen Reactions  . Benzodiazepines Hives  . Chantix  [Varenicline] Hives  . Cialis [Tadalafil]     Hives  . Dicyclomine Hives  . Meloxicam     Rash  . Penicillins Hives    DID THE REACTION INVOLVE: Swelling of the face/tongue/throat, SOB, or low BP? Unknown Sudden or severe rash/hives, skin peeling, or the inside of the mouth or nose? Unknown Did it require medical treatment? n  When did it last happen?Over 5 years ago. If all above answers are "NO", may proceed with cephalosporin use.   Oley Balm [Nabumetone]     Hives    Medical History:  Past Medical History:  Diagnosis Date  . Allergy    albuterol as needed  . Anal fissure   . Arthritis   . Colon polyps 01/13/11   Colonoscopy  . GERD (gastroesophageal reflux disease)   . Pneumonia   . Pre-diabetes     Surgical History:  Past Surgical  History:  Procedure Laterality Date  . ANAL FISSURE REPAIR  02/24/11  . CHONDROPLASTY  08/14/2018   Procedure: CHONDROPLASTY;  Surgeon: Leandrew Koyanagi, MD;  Location: [facility];  Service: Orthopedics;;  . COLONOSCOPY    . KNEE ARTHROSCOPY WITH MEDIAL MENISECTOMY Left 08/14/2018   Procedure: LEFT KNEE ARTHROSCOPY WITH PARTIAL MEDIAL MENISCECTOMY;  Surgeon: Leandrew Koyanagi, MD;  Location: [facility];  Service: Orthopedics;  Laterality: Left;  . KNEE ARTHROSCOPY WITH MEDIAL MENISECTOMY Right 10/18/2018   Procedure: RIGHT KNEE ARTHROSCOPY WITH PARTIAL MEDIAL MENISCECTOMY;  Surgeon: Leandrew Koyanagi, MD;  Location: [facility];  Service: Orthopedics;  Laterality: Right;  . TOTAL KNEE ARTHROPLASTY Left 01/24/2019   Procedure: LEFT TOTAL KNEE ARTHROPLASTY;  Surgeon: Leandrew Koyanagi, MD;  Location: [facility];  Service: Orthopedics;  Laterality: Left;    Family History:  Family History  Problem Relation Age of Onset  . Rheum arthritis Mother   . Allergies Daughter   . Allergies Sister   . Asthma Daughter     Social History:   He  reports that he quit smoking about 10 months ago. His smoking use included cigarettes. He smoked 0.00 packs per day for 27.00 years. He has never used smokeless tobacco. He reports current alcohol use. He reports that he does not use drugs.  Review of Systems:  Review of Systems  Constitutional: Negative for chills, fever and weight loss.  HENT: Negative for congestion, ear pain, sore throat and tinnitus.   Eyes: Negative.   Respiratory: Negative for cough, hemoptysis, sputum production, shortness of breath and wheezing.   Cardiovascular: Negative for chest pain, palpitations and leg swelling.  Gastrointestinal: Positive for heartburn. Negative for abdominal pain, blood in stool, constipation, diarrhea, melena, nausea and vomiting.  Genitourinary: Negative for dysuria, flank pain, frequency, hematuria and urgency.  Musculoskeletal:  Positive for joint pain and myalgias.  Skin: Negative.   Neurological: Negative for dizziness, focal weakness and headaches.    Physical Exam: Estimated body mass index is 27.76 kg/m as calculated from the following:   Height as of this encounter: 5' 6" (1.676 m).  Weight as of this encounter: 172 lb (78 kg). BP 132/80   Pulse 62   Temp 99 F (37.2 C)   Ht 5' 6" (1.676 m)   Wt 172 lb (78 kg)   SpO2 99%   BMI 27.76 kg/m   General Appearance: Well nourished, in no apparent distress.  Eyes: PERRLA, EOMs, conjunctiva no swelling or erythema ENT/Mouth: Ear canals clear bilaterally with no erythema, swelling, discharge.  TMs normal bilaterally with no erythema, bulging, or retractions.  Oropharynx clear and moist with no exudate, swelling, or erythema.  Dentition normal.   Neck: Right cervical adenopathy near mastoid/year.  Supple, thyroid normal. No bruits, JVD, cervical adenopathy Respiratory: Respiratory effort normal, BS equal bilaterally without rales, rhonchi, wheezing or stridor.  Cardio: RRR without murmurs, rubs or gallops. Brisk peripheral pulses without edema.  Chest: symmetric, with normal excursions Abdomen: Soft, nontender, no guarding, rebound, hernias, masses, or organomegaly. Musculoskeletal: Full ROM all peripheral extremities,5/5 strength, and antalgic gait, left knee in wrap, patient walking with crutches Skin: Warm, dry without rashes, lesions, ecchymosis. Neuro: A&Ox3, Cranial nerves intact, reflexes equal bilaterally. Normal muscle tone, no cerebellar symptoms. Sensation intact.  Psych: Normal affect, Insight and Judgment appropriate.   EKG: WNL no changes.  AORTA SCAN: WNL  [name] 3:14 PM [facility]

## 2019-07-10 ENCOUNTER — Ambulatory Visit (INDEPENDENT_AMBULATORY_CARE_PROVIDER_SITE_OTHER): Payer: BC Managed Care – PPO | Admitting: Physician Assistant

## 2019-07-10 ENCOUNTER — Encounter: Payer: Self-pay | Admitting: Physician Assistant

## 2019-07-10 ENCOUNTER — Other Ambulatory Visit: Payer: Self-pay

## 2019-07-10 VITALS — BP 132/80 | HR 62 | Temp 99.0°F | Ht 66.0 in | Wt 172.0 lb

## 2019-07-10 DIAGNOSIS — K219 Gastro-esophageal reflux disease without esophagitis: Secondary | ICD-10-CM

## 2019-07-10 DIAGNOSIS — R350 Frequency of micturition: Secondary | ICD-10-CM | POA: Diagnosis not present

## 2019-07-10 DIAGNOSIS — Z1329 Encounter for screening for other suspected endocrine disorder: Secondary | ICD-10-CM

## 2019-07-10 DIAGNOSIS — N401 Enlarged prostate with lower urinary tract symptoms: Secondary | ICD-10-CM

## 2019-07-10 DIAGNOSIS — E559 Vitamin D deficiency, unspecified: Secondary | ICD-10-CM | POA: Diagnosis not present

## 2019-07-10 DIAGNOSIS — Z130 Encounter for screening for diseases of the blood and blood-forming organs and certain disorders involving the immune mechanism: Secondary | ICD-10-CM | POA: Diagnosis not present

## 2019-07-10 DIAGNOSIS — R059 Cough, unspecified: Secondary | ICD-10-CM

## 2019-07-10 DIAGNOSIS — Z125 Encounter for screening for malignant neoplasm of prostate: Secondary | ICD-10-CM | POA: Diagnosis not present

## 2019-07-10 DIAGNOSIS — Z131 Encounter for screening for diabetes mellitus: Secondary | ICD-10-CM | POA: Diagnosis not present

## 2019-07-10 DIAGNOSIS — D649 Anemia, unspecified: Secondary | ICD-10-CM

## 2019-07-10 DIAGNOSIS — Z1389 Encounter for screening for other disorder: Secondary | ICD-10-CM | POA: Diagnosis not present

## 2019-07-10 DIAGNOSIS — I10 Essential (primary) hypertension: Secondary | ICD-10-CM

## 2019-07-10 DIAGNOSIS — E785 Hyperlipidemia, unspecified: Secondary | ICD-10-CM

## 2019-07-10 DIAGNOSIS — Z13220 Encounter for screening for lipoid disorders: Secondary | ICD-10-CM

## 2019-07-10 DIAGNOSIS — Z0000 Encounter for general adult medical examination without abnormal findings: Secondary | ICD-10-CM

## 2019-07-10 DIAGNOSIS — Z79899 Other long term (current) drug therapy: Secondary | ICD-10-CM

## 2019-07-10 DIAGNOSIS — F17200 Nicotine dependence, unspecified, uncomplicated: Secondary | ICD-10-CM

## 2019-07-10 DIAGNOSIS — E663 Overweight: Secondary | ICD-10-CM

## 2019-07-10 DIAGNOSIS — M1712 Unilateral primary osteoarthritis, left knee: Secondary | ICD-10-CM

## 2019-07-10 DIAGNOSIS — Z0001 Encounter for general adult medical examination with abnormal findings: Secondary | ICD-10-CM

## 2019-07-10 DIAGNOSIS — R7309 Other abnormal glucose: Secondary | ICD-10-CM

## 2019-07-10 DIAGNOSIS — F524 Premature ejaculation: Secondary | ICD-10-CM

## 2019-07-10 DIAGNOSIS — Z96652 Presence of left artificial knee joint: Secondary | ICD-10-CM

## 2019-07-10 DIAGNOSIS — F419 Anxiety disorder, unspecified: Secondary | ICD-10-CM

## 2019-07-10 MED ORDER — CITALOPRAM HYDROBROMIDE 20 MG PO TABS: 20.0000 mg | ORAL_TABLET | Freq: Every day | ORAL | 2 refills | Status: DC

## 2019-07-10 NOTE — Patient Instructions (Addendum)
PLEASE MESSAGE ME A PIC OF YOUR COVID VACCINE OR MESSAGE ME THE DATES! tHANKS  INFORMATION ABOUT YOUR [facility] Can walk into [address] building for an Personal assistant. They will have the order and take you back. You do not any paper work, I should get the result back today or tomorrow. This order is good for a year.  Can call [phone] to schedule an appointment if you wish.   Start on the celexa 20 mg Take 1/2 pill daily for 2-4 weeks Can increase to a whole pill after 2-4 weeks It take about 4 weeks to help with mood  Generally a cough is either coming from above or from below- so we will treat this OR it can be from irritation/viral cough  To treat the nasal drip:  Can do a steroid nasal spary 1-2 sparys at night each nostril. Remember to spray each nostril twice towards the outer part of your eye.  Do not sniff but instead pinch your nose and tilt your head back to help the medicine get into your sinuses.  The best time to do this is at bedtime. Stop if you get blurred vision or nose bleeds.   To treat the reflux Add on famotadine  To stop irritation: Need to STOP the cough Do sugar free candy Do the tessalon drops VOICE REST is VERY important  If not better in 2 weeks will refer to ENT  Go to the ER or call the office if you get any chest pain, shortness of breath, severe  headache, leg swelling.    Common causes of cough OR hoarseness OR sore throat:   Allergies, Viral Infections, Acid Reflux and Bacterial Infections.    Allergies and viral infections cause a cough OR sore throat by post nasal drip and are often worse at night, can also have sneezing, lower grade fevers, clear/yellow mucus. This is best treated with allergy medications or nasal sprays.  Please get on allegra for 1-2 weeks The strongest is allegra or fexafinadine  Cheapest at [employer], [employer], [employer]   Bacterial infections are more severe than allergies or viral infections with fever, teeth  pain, fatigue. This can be treated with prednisone and the same over the counter medication and after 7 days can be treated with an antibiotic.   Silent reflux/GERD can cause a cough OR sore throat OR hoarseness WITHOUT heart burn because the esophagus that goes to the stomach and trachea that goes to the lungs are very close and when you lay down the acid can irritate your throat and lungs. This can cause hoarseness, cough, and wheezing. Please stop any alcohol or anti-inflammatories like aleve/advil/ibuprofen and start an over the counter Prilosec or omeprazole 1-2 times daily before food for 2 weeks, then switch to over the counter zantac/ratinidine or pepcid/famotadine once at night for 2 weeks.    sometimes irritation causes more irritation. Try voice rest, use sugar free cough drops to prevent coughing, and try to stop clearing your throat.   If you ever have a cough that does not go away after trying these things please make a follow up visit for further evaluation or we can refer you to a specialist. Or if you ever have shortness of breath or chest pain go to the ER.    Silent reflux: Not all heartburn burns...Marland KitchenMarland KitchenMarland Kitchen  What is LPR? Laryngopharyngeal reflux (LPR) or silent reflux is a condition in which acid that is made in the stomach travels up the esophagus (swallowing  tube) and gets to the throat. Not everyone with reflux has a lot of heartburn or indigestion. In fact, many people with LPR never have heartburn. This is why LPR is called SILENT REFLUX, and the terms "Silent reflux" and "LPR" are often used interchangeably. Because LPR is silent, it is sometimes difficult to diagnose.  How can you tell if you have LPR?  Marland Kitchen Chronic hoarseness- Some people have hoarseness that comes and goes . throat clearing  . Cough . It can cause shortness of breath and cause asthma like symptoms. Marland Kitchen a feeling of a lump in the throat  . difficulty swallowing . a problem with too much nose and throat  drainage.  . Some people will feel their esophagus spasm which feels like their heart beating hard and fast, this will usually be after a meal, at rest, or lying down at night.    How do I treat this? Treatment for LPR should be individualized, and your doctor will suggest the best treatment for you. Generally there are several treatments for LPR: . changing habits and diet to reduce reflux,  . medications to reduce stomach acid, and  . surgery to prevent reflux. Most people with LPR need to modify how and when they eat, as well as take some medication, to get well. Sometimes, nonprescription liquid antacids, such as Maalox, Gelucil and Mylanta are recommended. When used, these antacids should be taken four times each day - one tablespoon one hour after each meal and before bedtime. Dietary and lifestyle changes alone are not often enough to control LPR - medications that reduce stomach acid are also usually needed. These must be prescribed by our doctor.   TIPS FOR REDUCING REFLUX AND LPR Control your LIFE-STYLE and your DIET! Marland Kitchen If you use tobacco, QUIT.  Marland Kitchen Smoking makes you reflux. After every cigarette you have some LPR.  . Don't wear clothing that is too tight, especially around the waist (trousers, corsets, belts).  . Do not lie down just after eating...in fact, do not eat within three hours of bedtime.  . You should be on a low-fat diet.  . Limit your intake of red meat.  . Limit your intake of butter.  Marland Kitchen Avoid fried foods.  . Avoid chocolate  . Avoid cheese.  Marland Kitchen Avoid eggs. Marland Kitchen Specifically avoid caffeine (especially coffee and tea), soda pop (especially cola) and mints.  . Avoid alcoholic beverages, particularly in the evening.

## 2019-07-11 LAB — CBC WITH DIFFERENTIAL/PLATELET
Absolute Monocytes: 665 cells/uL (ref 200–950)
Basophils Absolute: 70 cells/uL (ref 0–200)
Basophils Relative: 1 %
Eosinophils Absolute: 259 cells/uL (ref 15–500)
Eosinophils Relative: 3.7 %
HCT: 43.7 % (ref 38.5–50.0)
Hemoglobin: 13.6 g/dL (ref 13.2–17.1)
Lymphs Abs: 2835 cells/uL (ref 850–3900)
MCH: 25.9 pg — ABNORMAL LOW (ref 27.0–33.0)
MCHC: 31.1 g/dL — ABNORMAL LOW (ref 32.0–36.0)
MCV: 83.2 fL (ref 80.0–100.0)
MPV: 10.6 fL (ref 7.5–12.5)
Monocytes Relative: 9.5 %
Neutro Abs: 3171 cells/uL (ref 1500–7800)
Neutrophils Relative %: 45.3 %
Platelets: 263 10*3/uL (ref 140–400)
RBC: 5.25 10*6/uL (ref 4.20–5.80)
RDW: 13.1 % (ref 11.0–15.0)
Total Lymphocyte: 40.5 %
WBC: 7 10*3/uL (ref 3.8–10.8)

## 2019-07-11 LAB — COMPLETE METABOLIC PANEL WITH GFR
AG Ratio: 1.6 (calc) (ref 1.0–2.5)
ALT: 30 U/L (ref 9–46)
AST: 21 U/L (ref 10–35)
Albumin: 4.5 g/dL (ref 3.6–5.1)
Alkaline phosphatase (APISO): 109 U/L (ref 35–144)
BUN: 12 mg/dL (ref 7–25)
CO2: 27 mmol/L (ref 20–32)
Calcium: 9.9 mg/dL (ref 8.6–10.3)
Chloride: 105 mmol/L (ref 98–110)
Creat: 1.11 mg/dL (ref 0.70–1.33)
GFR, Est African American: 84 mL/min/{1.73_m2} (ref >=60)
GFR, Est Non African American: 73 mL/min/{1.73_m2} (ref >=60)
Globulin: 2.8 g/dL (calc) (ref 1.9–3.7)
Glucose, Bld: 91 mg/dL (ref 65–99)
Potassium: 4.3 mmol/L (ref 3.5–5.3)
Sodium: 141 mmol/L (ref 135–146)
Total Bilirubin: 0.3 mg/dL (ref 0.2–1.2)
Total Protein: 7.3 g/dL (ref 6.1–8.1)

## 2019-07-11 LAB — HEMOGLOBIN A1C
Hgb A1c MFr Bld: 6 % of total Hgb — ABNORMAL HIGH (ref <5.7)
Mean Plasma Glucose: 126 (calc)
eAG (mmol/L): 7 (calc)

## 2019-07-11 LAB — TESTOSTERONE: Testosterone: 341 ng/dL (ref 250–827)

## 2019-07-11 LAB — TSH: TSH: 1.16 mIU/L (ref 0.40–4.50)

## 2019-07-11 LAB — VITAMIN D 25 HYDROXY (VIT D DEFICIENCY, FRACTURES): Vit D, 25-Hydroxy: 33 ng/mL (ref 30–100)

## 2019-07-11 LAB — URINALYSIS, ROUTINE W REFLEX MICROSCOPIC
Bilirubin Urine: NEGATIVE
Glucose, UA: NEGATIVE
Hgb urine dipstick: NEGATIVE
Ketones, ur: NEGATIVE
Leukocytes,Ua: NEGATIVE
Nitrite: NEGATIVE
Protein, ur: NEGATIVE
Specific Gravity, Urine: 1.019 (ref 1.001–1.03)
pH: <=5 (ref 5.0–8.0)

## 2019-07-11 LAB — IRON, TOTAL/TOTAL IRON BINDING CAP
%SAT: 21 % (calc) (ref 20–48)
Iron: 68 ug/dL (ref 50–180)
TIBC: 331 mcg/dL (calc) (ref 250–425)

## 2019-07-11 LAB — LIPID PANEL
Cholesterol: 181 mg/dL (ref <200)
HDL: 58 mg/dL (ref >=40)
LDL Cholesterol (Calc): 107 mg/dL (calc) — ABNORMAL HIGH
Non-HDL Cholesterol (Calc): 123 mg/dL (calc) (ref <130)
Total CHOL/HDL Ratio: 3.1 (calc) (ref <5.0)
Triglycerides: 72 mg/dL (ref <150)

## 2019-07-11 LAB — PSA: PSA: 0.8 ng/mL (ref <=4.0)

## 2019-07-11 LAB — MICROALBUMIN / CREATININE URINE RATIO
Creatinine, Urine: 175 mg/dL (ref 20–320)
Microalb Creat Ratio: 2 mcg/mg creat (ref <30)
Microalb, Ur: 0.4 mg/dL

## 2019-07-11 LAB — MAGNESIUM: Magnesium: 1.9 mg/dL (ref 1.5–2.5)

## 2019-07-11 LAB — FERRITIN: Ferritin: 78 ng/mL (ref 38–380)

## 2019-07-14 ENCOUNTER — Other Ambulatory Visit: Payer: Self-pay

## 2019-07-24 ENCOUNTER — Encounter: Payer: Self-pay | Admitting: Orthopaedic Surgery

## 2019-07-24 ENCOUNTER — Ambulatory Visit: Payer: BC Managed Care – PPO | Admitting: Orthopaedic Surgery

## 2019-07-24 ENCOUNTER — Ambulatory Visit: Payer: Self-pay

## 2019-07-24 VITALS — Ht 66.0 in | Wt 172.8 lb

## 2019-07-24 DIAGNOSIS — Z96652 Presence of left artificial knee joint: Secondary | ICD-10-CM

## 2019-07-26 NOTE — Progress Notes (Signed)
Office Visit Note   Patient: Brett [name]           Date of Birth: 28-Apr-1960           MRN: [id number] Visit Date: 07/24/2019              Requested by: Lucky Cowboy, MD [address] [place],  [place] [address] PCP: Lucky Cowboy, MD   Assessment & Plan: Visit Diagnoses:  1. Status post total left knee replacement     Plan: Impression is 6 months status post left total knee replacement.  Overall doing well.  I do feel that he needs to work on strengthening more aggressively.  Dental prophylaxis reinforced.  We will see him in the next couple weeks for his right total knee replacement.  Follow-Up Instructions: Return in about 6 months (around 01/24/2020).   Orders:  Orders Placed This Encounter  Procedures   XR Knee 1-2 Views Left   No orders of the defined types were placed in this encounter.     Procedures: No procedures performed   Clinical Data: No additional findings.   Subjective: Chief Complaint  Patient presents with   Left Knee - Routine Post Op    Brett [name] is 28-month status post left uncemented total knee replacement.  Overall he is doing well.  No real complaints.   Review of Systems   Objective: Vital Signs: Ht 5\' 6"  (1.676 m)    Wt 172 lb 12.8 oz (78.4 kg)    BMI 27.89 kg/m   Physical Exam  Ortho Exam Left knee shows a fully healed surgical scar.  Excellent range of motion.  Stable to varus valgus. Specialty Comments:  No specialty comments available.  Imaging: No results found.   PMFS History: Patient Active Problem List   Diagnosis Date Noted   Status post total left knee replacement 01/24/2019   Primary osteoarthritis of left knee 12/17/2018   Primary osteoarthritis of right knee 12/17/2018   Complex tear of medial meniscus of right knee as current injury 08/06/2018   Overweight (BMI 25.0-29.9) 08/08/2017   Medication management 03/23/2014   Vitamin D deficiency 03/23/2014   Abnormal glucose  03/23/2014   Hyperlipemia 10/31/2006   GERD 10/31/2006   Past Medical History:  Diagnosis Date   Allergy    albuterol as needed   Anal fissure    Arthritis    Colon polyps 01/13/11   Colonoscopy   GERD (gastroesophageal reflux disease)    Pneumonia    Pre-diabetes     Family History  Problem Relation Age of Onset   Rheum arthritis Mother    Heart disease Mother    Diabetes Mother    Arthritis Mother    Diabetes Father    Blindness Father        from DM?   Allergies Daughter    Allergies Sister    Asthma Daughter     Past Surgical History:  Procedure Laterality Date   ANAL FISSURE REPAIR  02/24/11   CHONDROPLASTY  08/14/2018   Procedure: CHONDROPLASTY;  Surgeon: 10/14/2018, MD;  Location: [facility];  Service: Orthopedics;;   COLONOSCOPY     KNEE ARTHROSCOPY WITH MEDIAL MENISECTOMY Left 08/14/2018   Procedure: LEFT KNEE ARTHROSCOPY WITH PARTIAL MEDIAL MENISCECTOMY;  Surgeon: 10/14/2018, MD;  Location: [facility];  Service: Orthopedics;  Laterality: Left;   KNEE ARTHROSCOPY WITH MEDIAL MENISECTOMY Right 10/18/2018   Procedure: RIGHT KNEE ARTHROSCOPY WITH PARTIAL MEDIAL MENISCECTOMY;  Surgeon: Tarry Kos, MD;  Location: [facility];  Service: Orthopedics;  Laterality: Right;   TOTAL KNEE ARTHROPLASTY Left 01/24/2019   Procedure: LEFT TOTAL KNEE ARTHROPLASTY;  Surgeon: Tarry Kos, MD;  Location: [facility];  Service: Orthopedics;  Laterality: Left;   Social History   Occupational History    Employer: [employer]  Tobacco Use   Smoking status: Former Smoker    Packs/day: 0.25    Years: 27.00    Pack years: 6.75    Types: Cigarettes    Quit date: 08/20/2018    Years since quitting: 0.9   Smokeless tobacco: Never Used  Vaping Use   Vaping Use: Never used  Substance and Sexual Activity   Alcohol use: Yes    Comment: socially   Drug use: No   Sexual activity: Not on file

## 2019-07-28 ENCOUNTER — Telehealth: Payer: Self-pay | Admitting: Orthopaedic Surgery

## 2019-07-28 NOTE — Telephone Encounter (Signed)
Patient call requesting a call back from Dr. Roda Shutters nurse. Patient states his needs a new handicap placard. Please call patient about this matter at [phone].

## 2019-07-29 NOTE — Telephone Encounter (Signed)
Ready for pick up. Called patient no answer. LMOM.  

## 2019-07-29 NOTE — Telephone Encounter (Signed)
If so, how many months?  

## 2019-07-29 NOTE — Telephone Encounter (Signed)
12  

## 2019-07-30 ENCOUNTER — Other Ambulatory Visit (HOSPITAL_COMMUNITY): Payer: BC Managed Care – PPO

## 2019-07-30 NOTE — Progress Notes (Signed)
Your procedure is scheduled on Monday, August 2nd.  Report to [facility] Main Entrance "A" at 8:00 A.M., and check in at the Admitting office.  Call this number if you have problems the morning of surgery:  [phone]  Call [phone] if you have any questions prior to your surgery date Monday-Friday 8am-4pm   Remember:  Do not eat after midnight the night before your surgery  You may drink clear liquids until 7:00 A.M. the morning of your surgery.   Clear liquids allowed are: Water, Non-Citrus Juices (without pulp), Carbonated Beverages, Clear Tea, Black Coffee Only, and Gatorade  Enhanced Recovery after Surgery for Orthopedics Enhanced Recovery after Surgery is a protocol used to improve the stress on your body and your recovery after surgery.  Patient Instructions  . The night before surgery:  o No food after midnight. ONLY clear liquids after midnight  .  Marland Kitchen The day of surgery (if you do NOT have diabetes):  o Drink ONE (1) Pre-Surgery Clear Ensure by 7:00 A.M. the morning of surgery   o This drink was given to you during your hospital  pre-op appointment visit. o Nothing else to drink after completing the  Pre-Surgery Clear Ensure.  . The day of surgery (if you have diabetes): o  o Drink ONE (1) Gatorade 2 (G2) by 7:00 A.M. the morning of surgery   o This drink was given to you during your hospital  pre-op appointment visit.  o Color of the Gatorade may vary. Red is not allowed. o Nothing else to drink after completing the  Gatorade 2 (G2).         If you have questions, please contact your surgeon's office.   Take these medicines the morning of surgery with A SIP OF WATER  citalopram (CELEXA)  If needed - gabapentin (NEURONTIN)  As of today, STOP taking any Aspirin (unless otherwise instructed by your surgeon) Aleve, Naproxen, Ibuprofen, Motrin, Advil, Goody's, BC's, all herbal medications, fish oil, and all vitamins.                     Do not wear jewelry.             Do not wear lotions, powders, colognes, or deodorant.            Men may shave face and neck.            Do not bring valuables to the hospital.            [facility] is not responsible for any belongings or valuables.  Do NOT Smoke (Tobacco/Vaping) or drink Alcohol 24 hours prior to your procedure If you use a CPAP at night, you may bring all equipment for your overnight stay.   Contacts, glasses, dentures or bridgework may not be worn into surgery.      For patients admitted to the hospital, discharge time will be determined by your treatment team.   Patients discharged the day of surgery will not be allowed to drive home, and someone needs to stay with them for 24 hours.  Special instructions:   [facility]- Preparing For Surgery  Before surgery, you can play an important role. Because skin is not sterile, your skin needs to be as free of germs as possible. You can reduce the number of germs on your skin by washing with CHG (chlorahexidine gluconate) Soap before surgery.  CHG is an antiseptic cleaner which kills germs and bonds with the skin to continue  killing germs even after washing.    Oral Hygiene is also important to reduce your risk of infection.  Remember - BRUSH YOUR TEETH THE MORNING OF SURGERY WITH YOUR REGULAR TOOTHPASTE  Please do not use if you have an allergy to CHG or antibacterial soaps. If your skin becomes reddened/irritated stop using the CHG.  Do not shave (including legs and underarms) for at least 48 hours prior to first CHG shower. It is OK to shave your face.  Please follow these instructions carefully.   1. Shower the NIGHT BEFORE SURGERY and the MORNING OF SURGERY with CHG Soap.   2. If you chose to wash your hair, wash your hair first as usual with your normal shampoo.  3. After you shampoo, rinse your hair and body thoroughly to remove the shampoo.  4. Use CHG as you would any other liquid soap. You can apply CHG directly to the skin and wash  gently with a scrungie or a clean washcloth.   5. Apply the CHG Soap to your body ONLY FROM THE NECK DOWN.  Do not use on open wounds or open sores. Avoid contact with your eyes, ears, mouth and genitals (private parts). Wash Face and genitals (private parts)  with your normal soap.   6. Wash thoroughly, paying special attention to the area where your surgery will be performed.  7. Thoroughly rinse your body with warm water from the neck down.  8. DO NOT shower/wash with your normal soap after using and rinsing off the CHG Soap.  9. Pat yourself dry with a CLEAN TOWEL.  10. Wear CLEAN PAJAMAS to bed the night before surgery  11. Place CLEAN SHEETS on your bed the night of your first shower and DO NOT SLEEP WITH PETS.  Day of Surgery: Wear Clean/Comfortable clothing the morning of surgery Do not apply any deodorants/lotions.   Remember to brush your teeth WITH YOUR REGULAR TOOTHPASTE.   Please read over the following fact sheets that you were given.

## 2019-07-31 ENCOUNTER — Other Ambulatory Visit: Payer: Self-pay

## 2019-07-31 ENCOUNTER — Ambulatory Visit (HOSPITAL_COMMUNITY)
Admission: RE | Admit: 2019-07-31 | Discharge: 2019-07-31 | Disposition: A | Discharge status: Home / Self Care | Payer: BC Managed Care – PPO | Source: Ambulatory Visit | Attending: Physician Assistant | Admitting: Physician Assistant

## 2019-07-31 ENCOUNTER — Encounter (HOSPITAL_COMMUNITY)
Admission: RE | Admit: 2019-07-31 | Discharge: 2019-07-31 | Disposition: A | Discharge status: Home / Self Care | Payer: BC Managed Care – PPO | Source: Ambulatory Visit | Attending: Orthopaedic Surgery | Admitting: Orthopaedic Surgery

## 2019-07-31 ENCOUNTER — Encounter (HOSPITAL_COMMUNITY): Payer: Self-pay

## 2019-07-31 DIAGNOSIS — M1711 Unilateral primary osteoarthritis, right knee: Secondary | ICD-10-CM

## 2019-07-31 LAB — URINALYSIS, ROUTINE W REFLEX MICROSCOPIC
Bilirubin Urine: NEGATIVE
Glucose, UA: NEGATIVE mg/dL
Hgb urine dipstick: NEGATIVE
Ketones, ur: NEGATIVE mg/dL
Leukocytes,Ua: NEGATIVE
Nitrite: NEGATIVE
Protein, ur: NEGATIVE mg/dL
Specific Gravity, Urine: 1.018 (ref 1.005–1.030)
pH: 7 (ref 5.0–8.0)

## 2019-07-31 LAB — SURGICAL PCR SCREEN
MRSA, PCR: NEGATIVE
Staphylococcus aureus: NEGATIVE

## 2019-07-31 LAB — CBC WITH DIFFERENTIAL/PLATELET
Abs Immature Granulocytes: 0.01 10*3/uL (ref 0.00–0.07)
Basophils Absolute: 0.1 10*3/uL (ref 0.0–0.1)
Basophils Relative: 1 %
Eosinophils Absolute: 0.3 10*3/uL (ref 0.0–0.5)
Eosinophils Relative: 4 %
HCT: 44.3 % (ref 39.0–52.0)
Hemoglobin: 13.8 g/dL (ref 13.0–17.0)
Immature Granulocytes: 0 %
Lymphocytes Relative: 39 %
Lymphs Abs: 2.5 10*3/uL (ref 0.7–4.0)
MCH: 25.9 pg — ABNORMAL LOW (ref 26.0–34.0)
MCHC: 31.2 g/dL (ref 30.0–36.0)
MCV: 83.3 fL (ref 80.0–100.0)
Monocytes Absolute: 0.6 10*3/uL (ref 0.1–1.0)
Monocytes Relative: 9 %
Neutro Abs: 3 10*3/uL (ref 1.7–7.7)
Neutrophils Relative %: 47 %
Platelets: 251 10*3/uL (ref 150–400)
RBC: 5.32 MIL/uL (ref 4.22–5.81)
RDW: 13.2 % (ref 11.5–15.5)
WBC: 6.3 10*3/uL (ref 4.0–10.5)
nRBC: 0 % (ref 0.0–0.2)

## 2019-07-31 LAB — COMPREHENSIVE METABOLIC PANEL
ALT: 23 U/L (ref 0–44)
AST: 22 U/L (ref 15–41)
Albumin: 4 g/dL (ref 3.5–5.0)
Alkaline Phosphatase: 95 U/L (ref 38–126)
Anion gap: 9 (ref 5–15)
BUN: 15 mg/dL (ref 6–20)
CO2: 22 mmol/L (ref 22–32)
Calcium: 9.4 mg/dL (ref 8.9–10.3)
Chloride: 107 mmol/L (ref 98–111)
Creatinine, Ser: 1.37 mg/dL — ABNORMAL HIGH (ref 0.61–1.24)
GFR calc Af Amer: >60 mL/min (ref >60)
GFR calc non Af Amer: 56 mL/min — ABNORMAL LOW (ref >60)
Glucose, Bld: 84 mg/dL (ref 70–99)
Potassium: 4.1 mmol/L (ref 3.5–5.1)
Sodium: 138 mmol/L (ref 135–145)
Total Bilirubin: 0.5 mg/dL (ref 0.3–1.2)
Total Protein: 7.2 g/dL (ref 6.5–8.1)

## 2019-07-31 LAB — TYPE AND SCREEN
ABO/RH(D): B POS
Antibody Screen: NEGATIVE

## 2019-07-31 LAB — PROTIME-INR
INR: 1 (ref 0.8–1.2)
Prothrombin Time: 12.5 seconds (ref 11.4–15.2)

## 2019-07-31 LAB — APTT: aPTT: 31 seconds (ref 24–36)

## 2019-07-31 LAB — GLUCOSE, CAPILLARY: Glucose-Capillary: 102 mg/dL — ABNORMAL HIGH (ref 70–99)

## 2019-07-31 NOTE — Progress Notes (Signed)
PCP - Dr. Oneta Rack Cardiologist - pt denies   Chest x-ray - 07/31/19 EKG - 07/10/19 Stress Test - n/a ECHO - n/a Cardiac Cath - n/a  Pt is not diabetic, but checks sugars 3x/day. Had his A1C last checked 07/10/19 (see labs)  Blood Thinner Instructions:n/a Aspirin Instructions:n/a - per pt he has not taking ASA in over a year.   ERAS Protcol - yes PRE-SURGERY Ensure or G2- ensure  COVID TEST-  08/01/19  Coronavirus Screening  Have you experienced the following symptoms:  Cough yes/no: No Fever (>100.5F)  yes/no: No Runny nose yes/no: No Sore throat yes/no: No Difficulty breathing/shortness of breath  yes/no: No  Have you or a family member traveled in the last 14 days and where? yes/no: No   If the patient indicates "YES" to the above questions, their PAT will be rescheduled to limit the exposure to others and, the surgeon will be notified. THE PATIENT WILL NEED TO BE ASYMPTOMATIC FOR 14 DAYS.   If the patient is not experiencing any of these symptoms, the PAT nurse will instruct them to NOT bring anyone with them to their appointment since they may have these symptoms or traveled as well.   Please remind your patients and families that hospital visitation restrictions are in effect and the importance of the restrictions.     Anesthesia review: n/a  Patient denies shortness of breath, fever, cough and chest pain at PAT appointment   All instructions explained to the patient, with a verbal understanding of the material. Patient agrees to go over the instructions while at home for a better understanding. Patient also instructed to self quarantine after being tested for COVID-19. The opportunity to ask questions was provided.

## 2019-08-01 ENCOUNTER — Other Ambulatory Visit (HOSPITAL_COMMUNITY)
Admission: RE | Admit: 2019-08-01 | Discharge: 2019-08-01 | Disposition: A | Discharge status: Home / Self Care | Payer: BC Managed Care – PPO | Source: Ambulatory Visit | Attending: Orthopaedic Surgery | Admitting: Orthopaedic Surgery

## 2019-08-01 DIAGNOSIS — Z20822 Contact with and (suspected) exposure to covid-19: Secondary | ICD-10-CM | POA: Diagnosis not present

## 2019-08-01 DIAGNOSIS — Z01812 Encounter for preprocedural laboratory examination: Secondary | ICD-10-CM | POA: Diagnosis present

## 2019-08-01 LAB — SARS CORONAVIRUS 2 (TAT 6-24 HRS): SARS Coronavirus 2: NEGATIVE

## 2019-08-01 MED ORDER — BUPIVACAINE LIPOSOME 1.3 % IJ SUSP
20.0000 mL | Freq: Once | INTRAMUSCULAR | Status: DC
  Filled 2019-08-01: qty 20

## 2019-08-01 MED ORDER — TRANEXAMIC ACID 1000 MG/10ML IV SOLN
2000.0000 mg | INTRAVENOUS | Status: AC
  Administered 2019-08-04: 2000 mg via TOPICAL
  Filled 2019-08-01: qty 20

## 2019-08-02 ENCOUNTER — Encounter: Payer: Self-pay | Admitting: Physical Therapy

## 2019-08-04 ENCOUNTER — Other Ambulatory Visit: Payer: Self-pay | Admitting: Physician Assistant

## 2019-08-04 ENCOUNTER — Observation Stay (HOSPITAL_COMMUNITY)
Admission: RE | Admit: 2019-08-04 | Discharge: 2019-08-06 | Disposition: A | Discharge status: Home / Self Care | Payer: BC Managed Care – PPO | Attending: Orthopaedic Surgery | Admitting: Orthopaedic Surgery

## 2019-08-04 ENCOUNTER — Other Ambulatory Visit: Payer: Self-pay

## 2019-08-04 ENCOUNTER — Encounter (HOSPITAL_COMMUNITY): Payer: Self-pay | Admitting: Orthopaedic Surgery

## 2019-08-04 ENCOUNTER — Ambulatory Visit (HOSPITAL_COMMUNITY): Payer: BC Managed Care – PPO | Admitting: Certified Registered Nurse Anesthetist

## 2019-08-04 ENCOUNTER — Observation Stay (HOSPITAL_COMMUNITY): Payer: BC Managed Care – PPO

## 2019-08-04 ENCOUNTER — Encounter (HOSPITAL_COMMUNITY)
Admission: RE | Disposition: A | Discharge status: Home / Self Care | Payer: Self-pay | Source: Home / Self Care | Attending: Orthopaedic Surgery

## 2019-08-04 DIAGNOSIS — M1711 Unilateral primary osteoarthritis, right knee: Principal | ICD-10-CM | POA: Insufficient documentation

## 2019-08-04 DIAGNOSIS — R7303 Prediabetes: Secondary | ICD-10-CM | POA: Diagnosis not present

## 2019-08-04 DIAGNOSIS — Z7982 Long term (current) use of aspirin: Secondary | ICD-10-CM | POA: Diagnosis not present

## 2019-08-04 DIAGNOSIS — Z86010 Personal history of colonic polyps: Secondary | ICD-10-CM | POA: Insufficient documentation

## 2019-08-04 DIAGNOSIS — Z96652 Presence of left artificial knee joint: Secondary | ICD-10-CM | POA: Diagnosis not present

## 2019-08-04 DIAGNOSIS — Z96651 Presence of right artificial knee joint: Secondary | ICD-10-CM

## 2019-08-04 DIAGNOSIS — Z87891 Personal history of nicotine dependence: Secondary | ICD-10-CM | POA: Insufficient documentation

## 2019-08-04 HISTORY — PX: TOTAL KNEE ARTHROPLASTY: SHX125

## 2019-08-04 LAB — GLUCOSE, CAPILLARY
Glucose-Capillary: 82 mg/dL (ref 70–99)
Glucose-Capillary: 98 mg/dL (ref 70–99)
Glucose-Capillary: 139 mg/dL — ABNORMAL HIGH (ref 70–99)

## 2019-08-04 LAB — PREALBUMIN: Prealbumin: 24 mg/dL (ref 18–38)

## 2019-08-04 SURGERY — ARTHROPLASTY, KNEE, TOTAL
Anesthesia: Spinal | Site: Knee | Laterality: Right

## 2019-08-04 MED ORDER — ONDANSETRON HCL 4 MG/2ML IJ SOLN: 4.0000 mg | Freq: Once | INTRAMUSCULAR | Status: DC | PRN

## 2019-08-04 MED ORDER — HYDROMORPHONE HCL 1 MG/ML IJ SOLN
0.5000 mg | INTRAMUSCULAR | Status: DC | PRN
  Administered 2019-08-04: 1 mg via INTRAVENOUS
  Filled 2019-08-04: qty 1

## 2019-08-04 MED ORDER — MENTHOL 3 MG MT LOZG: 1.0000 | LOZENGE | OROMUCOSAL | Status: DC | PRN

## 2019-08-04 MED ORDER — PHENYLEPHRINE 40 MCG/ML (10ML) SYRINGE FOR IV PUSH (FOR BLOOD PRESSURE SUPPORT)
PREFILLED_SYRINGE | INTRAVENOUS | Status: AC
  Filled 2019-08-04: qty 10

## 2019-08-04 MED ORDER — PROPOFOL 10 MG/ML IV BOLUS
INTRAVENOUS | Status: AC
  Filled 2019-08-04: qty 20

## 2019-08-04 MED ORDER — SODIUM CHLORIDE 0.9% FLUSH
INTRAVENOUS | Status: DC | PRN
  Administered 2019-08-04: 20 mL via INTRAVENOUS

## 2019-08-04 MED ORDER — ONDANSETRON HCL 4 MG/2ML IJ SOLN: 4.0000 mg | Freq: Four times a day (QID) | INTRAMUSCULAR | Status: DC | PRN

## 2019-08-04 MED ORDER — METOCLOPRAMIDE HCL 5 MG/ML IJ SOLN: 5.0000 mg | Freq: Three times a day (TID) | INTRAMUSCULAR | Status: DC | PRN

## 2019-08-04 MED ORDER — IRRISEPT - 450ML BOTTLE WITH 0.05% CHG IN STERILE WATER, USP 99.95% OPTIME
TOPICAL | Status: DC | PRN
  Administered 2019-08-04: 450 mL

## 2019-08-04 MED ORDER — SODIUM CHLORIDE 0.9 % IV SOLN: INTRAVENOUS | Status: DC

## 2019-08-04 MED ORDER — CELECOXIB 200 MG PO CAPS
200.0000 mg | ORAL_CAPSULE | Freq: Two times a day (BID) | ORAL | Status: DC
  Administered 2019-08-04 – 2019-08-06 (×5): 200 mg via ORAL
  Filled 2019-08-04 (×6): qty 1

## 2019-08-04 MED ORDER — FENTANYL CITRATE (PF) 100 MCG/2ML IJ SOLN: 25.0000 ug | INTRAMUSCULAR | Status: DC | PRN

## 2019-08-04 MED ORDER — OXYCODONE HCL 5 MG PO TABS: 5.0000 mg | ORAL_TABLET | ORAL | Status: DC | PRN

## 2019-08-04 MED ORDER — SULFAMETHOXAZOLE-TRIMETHOPRIM 800-160 MG PO TABS
1.0000 | ORAL_TABLET | Freq: Two times a day (BID) | ORAL | 0 refills | Status: DC
Start: 2019-08-04 — End: 2019-08-26

## 2019-08-04 MED ORDER — BUPIVACAINE HCL (PF) 0.25 % IJ SOLN
INTRAMUSCULAR | Status: AC
  Filled 2019-08-04: qty 30

## 2019-08-04 MED ORDER — ASPIRIN EC 81 MG PO TBEC: 81.0000 mg | DELAYED_RELEASE_TABLET | Freq: Two times a day (BID) | ORAL | 0 refills | Status: DC

## 2019-08-04 MED ORDER — DEXAMETHASONE SODIUM PHOSPHATE 10 MG/ML IJ SOLN
INTRAMUSCULAR | Status: DC | PRN
  Administered 2019-08-04: 5 mg via INTRAVENOUS

## 2019-08-04 MED ORDER — 0.9 % SODIUM CHLORIDE (POUR BTL) OPTIME
TOPICAL | Status: DC | PRN
  Administered 2019-08-04: 1000 mL

## 2019-08-04 MED ORDER — INSULIN ASPART 100 UNIT/ML ~~LOC~~ SOLN: 0.0000 [IU] | Freq: Every day | SUBCUTANEOUS | Status: DC

## 2019-08-04 MED ORDER — GABAPENTIN 300 MG PO CAPS
300.0000 mg | ORAL_CAPSULE | Freq: Three times a day (TID) | ORAL | Status: DC
  Administered 2019-08-04 – 2019-08-06 (×6): 300 mg via ORAL
  Filled 2019-08-04 (×6): qty 1

## 2019-08-04 MED ORDER — CEFAZOLIN SODIUM-DEXTROSE 2-4 GM/100ML-% IV SOLN
2.0000 g | INTRAVENOUS | Status: AC
  Administered 2019-08-04: 2 g via INTRAVENOUS
  Filled 2019-08-04: qty 100

## 2019-08-04 MED ORDER — ACETAMINOPHEN 325 MG PO TABS
325.0000 mg | ORAL_TABLET | Freq: Four times a day (QID) | ORAL | Status: DC | PRN
  Administered 2019-08-05: 650 mg via ORAL
  Filled 2019-08-04: qty 2

## 2019-08-04 MED ORDER — GABAPENTIN 600 MG PO TABS
600.0000 mg | ORAL_TABLET | Freq: Two times a day (BID) | ORAL | Status: DC | PRN
  Filled 2019-08-04: qty 1

## 2019-08-04 MED ORDER — POLYETHYLENE GLYCOL 3350 17 G PO PACK: 17.0000 g | PACK | Freq: Every day | ORAL | Status: DC | PRN

## 2019-08-04 MED ORDER — FENTANYL CITRATE (PF) 100 MCG/2ML IJ SOLN
INTRAMUSCULAR | Status: AC
  Administered 2019-08-04: 100 ug via INTRAVENOUS
  Filled 2019-08-04: qty 2

## 2019-08-04 MED ORDER — SODIUM CHLORIDE 0.9 % IR SOLN
Status: DC | PRN
  Administered 2019-08-04: 3000 mL

## 2019-08-04 MED ORDER — OXYCODONE HCL 5 MG PO TABS: ORAL_TABLET | ORAL | 0 refills | Status: DC

## 2019-08-04 MED ORDER — DEXAMETHASONE SODIUM PHOSPHATE 10 MG/ML IJ SOLN
10.0000 mg | Freq: Once | INTRAMUSCULAR | Status: AC
  Administered 2019-08-05: 10 mg via INTRAVENOUS
  Filled 2019-08-04: qty 1

## 2019-08-04 MED ORDER — DOCUSATE SODIUM 100 MG PO CAPS
100.0000 mg | ORAL_CAPSULE | Freq: Two times a day (BID) | ORAL | Status: DC
  Administered 2019-08-04 – 2019-08-06 (×5): 100 mg via ORAL
  Filled 2019-08-04 (×5): qty 1

## 2019-08-04 MED ORDER — DIPHENHYDRAMINE HCL 12.5 MG/5ML PO ELIX: 25.0000 mg | ORAL_SOLUTION | ORAL | Status: DC | PRN

## 2019-08-04 MED ORDER — MAGNESIUM CITRATE PO SOLN: 1.0000 | Freq: Once | ORAL | Status: DC | PRN

## 2019-08-04 MED ORDER — SORBITOL 70 % SOLN: 30.0000 mL | Freq: Every day | Status: DC | PRN

## 2019-08-04 MED ORDER — POVIDONE-IODINE 10 % EX SWAB
2.0000 "application " | Freq: Once | CUTANEOUS | Status: AC
  Administered 2019-08-04: 2 via TOPICAL

## 2019-08-04 MED ORDER — ONDANSETRON HCL 4 MG/2ML IJ SOLN
INTRAMUSCULAR | Status: DC | PRN
  Administered 2019-08-04: 4 mg via INTRAVENOUS

## 2019-08-04 MED ORDER — ONDANSETRON HCL 4 MG PO TABS: 4.0000 mg | ORAL_TABLET | Freq: Three times a day (TID) | ORAL | 0 refills | Status: DC | PRN

## 2019-08-04 MED ORDER — OXYCODONE HCL 5 MG PO TABS: 5.0000 mg | ORAL_TABLET | Freq: Once | ORAL | Status: DC | PRN

## 2019-08-04 MED ORDER — LIDOCAINE 2% (20 MG/ML) 5 ML SYRINGE
INTRAMUSCULAR | Status: AC
  Filled 2019-08-04: qty 5

## 2019-08-04 MED ORDER — KETOROLAC TROMETHAMINE 15 MG/ML IJ SOLN: 30.0000 mg | Freq: Four times a day (QID) | INTRAMUSCULAR | Status: DC

## 2019-08-04 MED ORDER — BUPIVACAINE LIPOSOME 1.3 % IJ SUSP
INTRAMUSCULAR | Status: DC | PRN
  Administered 2019-08-04: 20 mL

## 2019-08-04 MED ORDER — ONDANSETRON HCL 4 MG/2ML IJ SOLN
INTRAMUSCULAR | Status: AC
  Filled 2019-08-04: qty 2

## 2019-08-04 MED ORDER — LACTATED RINGERS IV SOLN: INTRAVENOUS | Status: DC

## 2019-08-04 MED ORDER — MIDAZOLAM HCL 2 MG/2ML IJ SOLN
INTRAMUSCULAR | Status: AC
  Filled 2019-08-04: qty 2

## 2019-08-04 MED ORDER — LIDOCAINE 2% (20 MG/ML) 5 ML SYRINGE
INTRAMUSCULAR | Status: DC | PRN
Start: 2019-08-04 — End: 2019-08-04
  Administered 2019-08-04: 40 mg via INTRAVENOUS

## 2019-08-04 MED ORDER — CITALOPRAM HYDROBROMIDE 20 MG PO TABS
20.0000 mg | ORAL_TABLET | Freq: Every day | ORAL | Status: DC
  Administered 2019-08-04 – 2019-08-06 (×3): 20 mg via ORAL
  Filled 2019-08-04 (×3): qty 1

## 2019-08-04 MED ORDER — FENTANYL CITRATE (PF) 100 MCG/2ML IJ SOLN
100.0000 ug | Freq: Once | INTRAMUSCULAR | Status: AC
  Filled 2019-08-04: qty 2

## 2019-08-04 MED ORDER — PROPOFOL 500 MG/50ML IV EMUL
INTRAVENOUS | Status: DC | PRN
  Administered 2019-08-04: 50 ug/kg/min via INTRAVENOUS

## 2019-08-04 MED ORDER — OXYCODONE HCL 5 MG PO TABS: 10.0000 mg | ORAL_TABLET | ORAL | Status: DC | PRN

## 2019-08-04 MED ORDER — VANCOMYCIN HCL 1000 MG IV SOLR
INTRAVENOUS | Status: DC | PRN
  Administered 2019-08-04: 1000 mg

## 2019-08-04 MED ORDER — OXYCODONE HCL 5 MG/5ML PO SOLN: 5.0000 mg | Freq: Once | ORAL | Status: DC | PRN

## 2019-08-04 MED ORDER — DEXAMETHASONE SODIUM PHOSPHATE 10 MG/ML IJ SOLN
INTRAMUSCULAR | Status: AC
  Filled 2019-08-04: qty 1

## 2019-08-04 MED ORDER — CEFAZOLIN SODIUM-DEXTROSE 2-4 GM/100ML-% IV SOLN
2.0000 g | Freq: Four times a day (QID) | INTRAVENOUS | Status: AC
  Administered 2019-08-04 – 2019-08-05 (×3): 2 g via INTRAVENOUS
  Filled 2019-08-04 (×3): qty 100

## 2019-08-04 MED ORDER — METOCLOPRAMIDE HCL 5 MG PO TABS: 5.0000 mg | ORAL_TABLET | Freq: Three times a day (TID) | ORAL | Status: DC | PRN

## 2019-08-04 MED ORDER — ACETAMINOPHEN 500 MG PO TABS
1000.0000 mg | ORAL_TABLET | Freq: Four times a day (QID) | ORAL | Status: AC
  Administered 2019-08-04 – 2019-08-05 (×4): 1000 mg via ORAL
  Filled 2019-08-04 (×4): qty 2

## 2019-08-04 MED ORDER — PROPOFOL 10 MG/ML IV BOLUS
INTRAVENOUS | Status: DC | PRN
  Administered 2019-08-04: 50 mg via INTRAVENOUS

## 2019-08-04 MED ORDER — ROPIVACAINE HCL 5 MG/ML IJ SOLN
INTRAMUSCULAR | Status: DC | PRN
  Administered 2019-08-04: 30 mL via PERINEURAL

## 2019-08-04 MED ORDER — OXYCODONE HCL ER 10 MG PO T12A
10.0000 mg | EXTENDED_RELEASE_TABLET | Freq: Two times a day (BID) | ORAL | Status: DC
  Administered 2019-08-04 – 2019-08-06 (×5): 10 mg via ORAL
  Filled 2019-08-04 (×5): qty 1

## 2019-08-04 MED ORDER — METHOCARBAMOL 500 MG PO TABS: 500.0000 mg | ORAL_TABLET | Freq: Two times a day (BID) | ORAL | 0 refills | Status: DC | PRN

## 2019-08-04 MED ORDER — VANCOMYCIN HCL 1000 MG IV SOLR
INTRAVENOUS | Status: AC
  Filled 2019-08-04: qty 1000

## 2019-08-04 MED ORDER — ONDANSETRON HCL 4 MG PO TABS: 4.0000 mg | ORAL_TABLET | Freq: Four times a day (QID) | ORAL | Status: DC | PRN

## 2019-08-04 MED ORDER — BUPIVACAINE-EPINEPHRINE 0.25% -1:200000 IJ SOLN
INTRAMUSCULAR | Status: DC | PRN
  Administered 2019-08-04: 20 mL

## 2019-08-04 MED ORDER — INSULIN ASPART 100 UNIT/ML ~~LOC~~ SOLN
0.0000 [IU] | Freq: Three times a day (TID) | SUBCUTANEOUS | Status: DC
  Administered 2019-08-05: 3 [IU] via SUBCUTANEOUS

## 2019-08-04 MED ORDER — DOCUSATE SODIUM 100 MG PO CAPS: 100.0000 mg | ORAL_CAPSULE | Freq: Every day | ORAL | 2 refills | Status: AC | PRN

## 2019-08-04 MED ORDER — TRANEXAMIC ACID-NACL 1000-0.7 MG/100ML-% IV SOLN
1000.0000 mg | INTRAVENOUS | Status: AC
  Administered 2019-08-04: 1000 mg via INTRAVENOUS
  Filled 2019-08-04: qty 100

## 2019-08-04 MED ORDER — ASPIRIN 81 MG PO CHEW
81.0000 mg | CHEWABLE_TABLET | Freq: Two times a day (BID) | ORAL | Status: DC
  Administered 2019-08-04 – 2019-08-06 (×4): 81 mg via ORAL
  Filled 2019-08-04 (×4): qty 1

## 2019-08-04 MED ORDER — PHENOL 1.4 % MT LIQD: 1.0000 | OROMUCOSAL | Status: DC | PRN

## 2019-08-04 MED ORDER — MELATONIN 5 MG PO TABS: 10.0000 mg | ORAL_TABLET | Freq: Every evening | ORAL | Status: DC | PRN

## 2019-08-04 MED ORDER — ALUM & MAG HYDROXIDE-SIMETH 200-200-20 MG/5ML PO SUSP: 30.0000 mL | ORAL | Status: DC | PRN

## 2019-08-04 SURGICAL SUPPLY — 71 items
ALCOHOL 70% 16 OZ (MISCELLANEOUS) ×2 IMPLANT
BAG DECANTER FOR FLEXI CONT (MISCELLANEOUS) ×2 IMPLANT
BANDAGE ESMARK 6X9 LF (GAUZE/BANDAGES/DRESSINGS) ×1 IMPLANT
BLADE SAG 18X100X1.27 (BLADE) ×2 IMPLANT
BNDG ESMARK 6X9 LF (GAUZE/BANDAGES/DRESSINGS) ×2
BOWL SMART MIX CTS (DISPOSABLE) ×2 IMPLANT
CLSR STERI-STRIP ANTIMIC 1/2X4 (GAUZE/BANDAGES/DRESSINGS) ×2 IMPLANT
COMPONENT TRI CR FEM SZ5 KNEE (Orthopedic Implant) ×1 IMPLANT
COVER SURGICAL LIGHT HANDLE (MISCELLANEOUS) ×2 IMPLANT
CUFF TOURN SGL QUICK 34 (TOURNIQUET CUFF) ×2
CUFF TRNQT CYL 34X4.125X (TOURNIQUET CUFF) ×1 IMPLANT
DERMABOND ADVANCED (GAUZE/BANDAGES/DRESSINGS) ×1
DERMABOND ADVANCED .7 DNX12 (GAUZE/BANDAGES/DRESSINGS) ×1 IMPLANT
DRAPE EXTREMITY T 121X128X90 (DISPOSABLE) ×2 IMPLANT
DRAPE HALF SHEET 40X57 (DRAPES) ×2 IMPLANT
DRAPE INCISE IOBAN 66X45 STRL (DRAPES) ×2 IMPLANT
DRAPE ORTHO SPLIT 77X108 STRL (DRAPES) ×4
DRAPE POUCH INSTRU U-SHP 10X18 (DRAPES) ×2 IMPLANT
DRAPE SURG ORHT 6 SPLT 77X108 (DRAPES) ×2 IMPLANT
DRAPE U-SHAPE 47X51 STRL (DRAPES) ×4 IMPLANT
DRSG AQUACEL AG ADV 3.5X10 (GAUZE/BANDAGES/DRESSINGS) IMPLANT
DRSG AQUACEL AG ADV 3.5X14 (GAUZE/BANDAGES/DRESSINGS) ×2 IMPLANT
DURAPREP 26ML APPLICATOR (WOUND CARE) ×6 IMPLANT
ELECT CAUTERY BLADE 6.4 (BLADE) ×2 IMPLANT
ELECT REM PT RETURN 9FT ADLT (ELECTROSURGICAL) ×2
ELECTRODE REM PT RTRN 9FT ADLT (ELECTROSURGICAL) ×1 IMPLANT
GLOVE BIOGEL PI IND STRL 7.0 (GLOVE) ×1 IMPLANT
GLOVE BIOGEL PI INDICATOR 7.0 (GLOVE) ×1
GLOVE ECLIPSE 7.0 STRL STRAW (GLOVE) ×6 IMPLANT
GLOVE SKINSENSE NS SZ7.5 (GLOVE) ×3
GLOVE SKINSENSE STRL SZ7.5 (GLOVE) ×3 IMPLANT
GLOVE SURG SYN 7.5  E (GLOVE) ×8
GLOVE SURG SYN 7.5 E (GLOVE) ×4 IMPLANT
GOWN STRL REIN XL XLG (GOWN DISPOSABLE) ×2 IMPLANT
GOWN STRL REUS W/ TWL LRG LVL3 (GOWN DISPOSABLE) ×1 IMPLANT
GOWN STRL REUS W/TWL LRG LVL3 (GOWN DISPOSABLE) ×2
HANDPIECE INTERPULSE COAX TIP (DISPOSABLE) ×2
HOOD PEEL AWAY FLYTE STAYCOOL (MISCELLANEOUS) ×4 IMPLANT
INSERT TIBIAL BEARING 11MM (Insert) ×2 IMPLANT
JET LAVAGE IRRISEPT WOUND (IRRIGATION / IRRIGATOR) ×2
KIT BASIN OR (CUSTOM PROCEDURE TRAY) ×2 IMPLANT
KIT TURNOVER KIT B (KITS) ×2 IMPLANT
KNEE PATELLA ASYMMETRIC 10X35 (Knees) ×2 IMPLANT
KNEE TIBIAL COMPONENT SZ5 (Knees) ×2 IMPLANT
LAVAGE JET IRRISEPT WOUND (IRRIGATION / IRRIGATOR) ×1 IMPLANT
MANIFOLD NEPTUNE II (INSTRUMENTS) ×2 IMPLANT
MARKER SKIN DUAL TIP RULER LAB (MISCELLANEOUS) ×2 IMPLANT
NEEDLE SPNL 18GX3.5 QUINCKE PK (NEEDLE) ×4 IMPLANT
NS IRRIG 1000ML POUR BTL (IV SOLUTION) ×2 IMPLANT
PACK TOTAL JOINT (CUSTOM PROCEDURE TRAY) ×2 IMPLANT
PAD ARMBOARD 7.5X6 YLW CONV (MISCELLANEOUS) ×4 IMPLANT
SAW OSC TIP CART 19.5X105X1.3 (SAW) ×2 IMPLANT
SET HNDPC FAN SPRY TIP SCT (DISPOSABLE) ×1 IMPLANT
STAPLER VISISTAT 35W (STAPLE) IMPLANT
SUCTION FRAZIER HANDLE 10FR (MISCELLANEOUS) ×2
SUCTION TUBE FRAZIER 10FR DISP (MISCELLANEOUS) ×1 IMPLANT
SUT ETHILON 2 0 FS 18 (SUTURE) IMPLANT
SUT MNCRL AB 4-0 PS2 18 (SUTURE) IMPLANT
SUT VIC AB 0 CT1 27 (SUTURE) ×4
SUT VIC AB 0 CT1 27XBRD ANBCTR (SUTURE) ×2 IMPLANT
SUT VIC AB 1 CTX 27 (SUTURE) ×6 IMPLANT
SUT VIC AB 2-0 CT1 27 (SUTURE) ×8
SUT VIC AB 2-0 CT1 TAPERPNT 27 (SUTURE) ×4 IMPLANT
SYR 50ML LL SCALE MARK (SYRINGE) ×4 IMPLANT
TOWEL GREEN STERILE (TOWEL DISPOSABLE) ×2 IMPLANT
TOWEL GREEN STERILE FF (TOWEL DISPOSABLE) ×2 IMPLANT
TRAY CATH 16FR W/PLASTIC CATH (SET/KITS/TRAYS/PACK) IMPLANT
TRI CRUC RET FEM SZ5 KNEE (Orthopedic Implant) ×2 IMPLANT
UNDERPAD 30X36 HEAVY ABSORB (UNDERPADS AND DIAPERS) ×2 IMPLANT
WRAP KNEE MAXI GEL POST OP (GAUZE/BANDAGES/DRESSINGS) ×2 IMPLANT
YANKAUER SUCT BULB TIP NO VENT (SUCTIONS) ×2 IMPLANT

## 2019-08-04 NOTE — Progress Notes (Signed)
Orthopedic Tech Progress Note Patient Details:  Brett [name] 1960-05-31 [id number]  CPM Right Knee CPM Right Knee: On Right Knee Flexion (Degrees): 65 Right Knee Extension (Degrees): 0 Additional Comments: ADDED ICE  Post Interventions Patient Tolerated: Well Instructions Provided: Care of device  [name] 08/04/2019, 3:38 PM

## 2019-08-04 NOTE — Discharge Instructions (Signed)

## 2019-08-04 NOTE — H&P (Signed)
PREOPERATIVE H&P  Chief Complaint: Osteoarthritis Right Knee  HPI: Brett [name] is a 59 y.o. male who presents for surgical treatment of Osteoarthritis Right Knee.  He denies any changes in medical history.  Past Medical History:  Diagnosis Date   Allergy    albuterol as needed   Anal fissure    Arthritis    Colon polyps 01/13/11   Colonoscopy   GERD (gastroesophageal reflux disease)    Pneumonia    Pre-diabetes    Past Surgical History:  Procedure Laterality Date   ANAL FISSURE REPAIR  02/24/11   CHONDROPLASTY  08/14/2018   Procedure: CHONDROPLASTY;  Surgeon: Leandrew Koyanagi, MD;  Location: [place];  Service: Orthopedics;;   COLONOSCOPY     KNEE ARTHROSCOPY WITH MEDIAL MENISECTOMY Left 08/14/2018   Procedure: LEFT KNEE ARTHROSCOPY WITH PARTIAL MEDIAL MENISCECTOMY;  Surgeon: Leandrew Koyanagi, MD;  Location: [place];  Service: Orthopedics;  Laterality: Left;   KNEE ARTHROSCOPY WITH MEDIAL MENISECTOMY Right 10/18/2018   Procedure: RIGHT KNEE ARTHROSCOPY WITH PARTIAL MEDIAL MENISCECTOMY;  Surgeon: Leandrew Koyanagi, MD;  Location: [place];  Service: Orthopedics;  Laterality: Right;   TOTAL KNEE ARTHROPLASTY Left 01/24/2019   Procedure: LEFT TOTAL KNEE ARTHROPLASTY;  Surgeon: Leandrew Koyanagi, MD;  Location: [place];  Service: Orthopedics;  Laterality: Left;   Social History   Socioeconomic History   Marital status: Married    Spouse name: Not on file   Number of children: 2   Years of education: Not on file   Highest education level: Not on file  Occupational History    Employer: [employer]  Tobacco Use   Smoking status: Former Smoker    Packs/day: 0.25    Years: 27.00    Pack years: 6.75    Types: Cigarettes    Quit date: 08/20/2018    Years since quitting: 0.9   Smokeless tobacco: Never Used  Vaping Use   Vaping Use: Never used  Substance and Sexual Activity   Alcohol use: Yes    Comment: socially    Drug use: No   Sexual activity: Not on file  Other Topics Concern   Not on file  Social History Narrative   Not on file   Social Determinants of Health   Financial Resource Strain:    Difficulty of Paying Living Expenses:   Food Insecurity:    Worried About Charity fundraiser in the Last Year:    Arboriculturist in the Last Year:   Transportation Needs:    Film/video editor (Medical):    Lack of Transportation (Non-Medical):   Physical Activity:    Days of Exercise per Week:    Minutes of Exercise per Session:   Stress:    Feeling of Stress :   Social Connections:    Frequency of Communication with Friends and Family:    Frequency of Social Gatherings with Friends and Family:    Attends Religious Services:    Active Member of Clubs or Organizations:    Attends Music therapist:    Marital Status:    Family History  Problem Relation Age of Onset   Rheum arthritis Mother    Heart disease Mother    Diabetes Mother    Arthritis Mother    Diabetes Father    Blindness Father        from DM?   Allergies Daughter    Allergies Sister    Asthma  Daughter    Allergies  Allergen Reactions   Benzodiazepines Hives   Chantix  [Varenicline] Hives   Cialis [Tadalafil]     Hives   Dicyclomine Hives   Meloxicam     Rash   Penicillins Hives    DID THE REACTION INVOLVE: Swelling of the face/tongue/throat, SOB, or low BP? Unknown Sudden or severe rash/hives, skin peeling, or the inside of the mouth or nose? Unknown Did it require medical treatment? n  When did it last happen?Over 5 years ago. If all above answers are NO, may proceed with cephalosporin use.    Relafen [Nabumetone]     Hives   Prior to Admission medications   Medication Sig Start Date End Date Taking? Authorizing Provider  Cholecalciferol (VITAMIN D-3) 125 MCG (5000 UT) TABS Take 5,000 Units by mouth daily.   Yes [provider]  citalopram  (CELEXA) 20 MG tablet Take 1 tablet (20 mg total) by mouth daily. 07/10/19 07/09/20 Yes [name], PA-C  gabapentin (NEURONTIN) 600 MG tablet Take 600 mg by mouth 2 (two) times daily as needed (pain).   Yes [provider]  ibuprofen (ADVIL) 800 MG tablet Take 1 tablet (800 mg total) by mouth every 8 (eight) hours as needed. 03/10/19  Yes Aundra Dubin, PA-C  Melatonin 10 MG TABS Take 10 mg by mouth at bedtime as needed (sleep).   Yes [provider]  Acetaminophen-Codeine 300-30 MG tablet acetaminophen 300 mg-codeine 30 mg tablet  TAKE 1 TABLET BY MOUTH EVERY 6 HOURS AS NEEDED FOR PAIN    [provider]  aspirin EC 81 MG tablet Take 1 tablet (81 mg total) by mouth 2 (two) times daily. Patient not taking: Reported on 07/22/2019 01/24/19   Leandrew Koyanagi, MD  blood glucose meter kit and supplies Test blood sugar twice daily or as directed by YOUR provider. 08/28/18   [name], PA-C  blood glucose meter kit and supplies Dispense based insurance preference. E11.22 08/29/18   [name], PA-C  glucose blood test strip Use as instructed 03/23/14   Rolene Course, PA-C     Positive ROS: All other systems have been reviewed and were otherwise negative with the exception of those mentioned in the HPI and as above.  Physical Exam: General: Alert, no acute distress Cardiovascular: No pedal edema Respiratory: No cyanosis, no use of accessory musculature GI: abdomen soft Skin: No lesions in the area of chief complaint Neurologic: Sensation intact distally Psychiatric: Patient is competent for consent with normal mood and affect Lymphatic: no lymphedema  MUSCULOSKELETAL: exam stable  Assessment: Osteoarthritis Right Knee  Plan: Plan for Procedure(s): RIGHT TOTAL KNEE ARTHROPLASTY  The risks benefits and alternatives were discussed with the patient including but not limited to the risks of nonoperative treatment, versus surgical intervention including  infection, bleeding, nerve injury,  blood clots, cardiopulmonary complications, morbidity, mortality, among others, and they were willing to proceed.   Preoperative templating of the joint replacement has been completed, documented, and submitted to the Operating Room personnel in order to optimize intra-operative equipment management.   Eduard Roux, MD 08/04/2019 6:59 AM

## 2019-08-04 NOTE — Anesthesia Postprocedure Evaluation (Signed)
Anesthesia Post Note  Patient: Acupuncturist) Performed: RIGHT TOTAL KNEE ARTHROPLASTY (Right Knee)     Patient location during evaluation: PACU Anesthesia Type: Spinal Level of consciousness: oriented and awake and alert Pain management: pain level controlled Vital Signs Assessment: post-procedure vital signs reviewed and stable Respiratory status: spontaneous breathing, respiratory function stable and nonlabored ventilation Cardiovascular status: blood pressure returned to baseline and stable Postop Assessment: no headache, no backache, no apparent nausea or vomiting and spinal receding Anesthetic complications: no   No complications documented.  Last Vitals:  Vitals:   08/04/19 1350 08/04/19 1420  BP: (!) 125/93 121/84  Pulse: (!) 45 60  Resp: 12 17  Temp: 36.8 C   SpO2: 99% 99%    Last Pain:  Vitals:   08/04/19 1300  TempSrc:   PainSc: 0-No pain                 Brett [name]

## 2019-08-04 NOTE — Anesthesia Procedure Notes (Addendum)
Procedure Name: MAC Date/Time: 08/04/2019 10:19 AM Performed by: Michele Rockers, CRNA Pre-anesthesia Checklist: Patient identified, Emergency Drugs available, Suction available, Timeout performed and Patient being monitored Patient Re-evaluated:Patient Re-evaluated prior to induction Oxygen Delivery Method: Simple face mask

## 2019-08-04 NOTE — Evaluation (Signed)
Physical Therapy Evaluation Patient Details Name: Brett [name] MRN: [id number] DOB: 03/17/1960 Today's Date: 08/04/2019   History of Present Illness  Pt is a 59 y/o male s/p R TKA. PMH includes pre-diabetes and L TKA.   Clinical Impression  Pt is s/p surgery above with deficits below. Pt with increased pain and only able to tolerate bed mobility this session. Educated about knee precautions and started HEP. Will continue to follow acutely to maximize functional mobility independence and safety.     Follow Up Recommendations Follow surgeon's recommendation for DC plan and follow-up therapies    Equipment Recommendations  None recommended by PT    Recommendations for Other Services       Precautions / Restrictions Precautions Precautions: Knee Precaution Booklet Issued: No Precaution Comments: Reviewed knee precautions with pt.  Restrictions Weight Bearing Restrictions: Yes RLE Weight Bearing: Weight bearing as tolerated      Mobility  Bed Mobility Overal bed mobility: Needs Assistance Bed Mobility: Supine to Sit;Sit to Supine     Supine to sit: Min assist Sit to supine: Min assist   General bed mobility comments: Min A for RLE assist. Increased time to come to EOB. Pt reporting increased pain and refusing further mobility at this time.   Transfers                    Ambulation/Gait                Stairs            Wheelchair Mobility    Modified Rankin (Stroke Patients Only)       Balance                                             Pertinent Vitals/Pain Pain Assessment: Faces Faces Pain Scale: Hurts whole lot Pain Location: R knee  Pain Descriptors / Indicators: Guarding;Grimacing;Operative site guarding Pain Intervention(s): Limited activity within patient's tolerance;Monitored during session;Repositioned    Home Living Family/patient expects to be discharged to:: Private residence Living Arrangements:  Spouse/significant other Available Help at Discharge: Family;Available 24 hours/day Type of Home: House Home Access: Stairs to enter Entrance Stairs-Rails: None Entrance Stairs-Number of Steps: 2 Home Layout: One level Home Equipment: Walker - 2 wheels;Walker - 4 wheels;Cane - single point;Shower seat;Bedside commode      Prior Function Level of Independence: Independent with assistive device(s)         Comments: Was using cane for ambulation      Hand Dominance        Extremity/Trunk Assessment   Upper Extremity Assessment Upper Extremity Assessment: Overall WFL for tasks assessed    Lower Extremity Assessment Lower Extremity Assessment: RLE deficits/detail RLE Deficits / Details: Deficits consistent with post op pain and weakness.     Cervical / Trunk Assessment Cervical / Trunk Assessment: Normal  Communication   Communication: No difficulties  Cognition Arousal/Alertness: Awake/alert Behavior During Therapy: WFL for tasks assessed/performed Overall Cognitive Status: Within Functional Limits for tasks assessed                                        General Comments      Exercises General Exercises - Lower Extremity Ankle Circles/Pumps: AROM;Both;20 reps;Supine   Assessment/Plan    PT  Assessment Patient needs continued PT services  PT Problem List Decreased strength;Decreased range of motion;Decreased balance;Decreased mobility;Decreased activity tolerance;Decreased knowledge of use of DME;Decreased knowledge of precautions;Pain       PT Treatment Interventions DME instruction;Gait training;Stair training;Functional mobility training;Therapeutic activities;Therapeutic exercise;Balance training;Patient/family education    PT Goals (Current goals can be found in the Care Plan section)  Acute Rehab PT Goals Patient Stated Goal: To decrease pain  PT Goal Formulation: With patient Time For Goal Achievement: 08/18/19 Potential to Achieve  Goals: Good    Frequency 7X/week   Barriers to discharge        Co-evaluation               AM-PAC PT "6 Clicks" Mobility  Outcome Measure Help needed turning from your back to your side while in a flat bed without using bedrails?: A Little Help needed moving from lying on your back to sitting on the side of a flat bed without using bedrails?: A Little Help needed moving to and from a bed to a chair (including a wheelchair)?: A Lot Help needed standing up from a chair using your arms (e.g., wheelchair or bedside chair)?: A Lot Help needed to walk in hospital room?: A Lot Help needed climbing 3-5 steps with a railing? : A Lot 6 Click Score: 14    End of Session   Activity Tolerance: Patient limited by pain Patient left: in bed;with call bell/phone within reach Nurse Communication: Mobility status PT Visit Diagnosis: Pain;Other abnormalities of gait and mobility (R26.89);Difficulty in walking, not elsewhere classified (R26.2) Pain - Right/Left: Right Pain - part of body: Knee    Time: 4166-0630 PT Time Calculation (min) (ACUTE ONLY): 15 min   Charges:   PT Evaluation $PT Eval Low Complexity: 1 Low          Cindee Salt, DPT  Acute Rehabilitation Services  Pager: [phone] Office: [phone]   [name] 08/04/2019, 6:12 PM

## 2019-08-04 NOTE — Anesthesia Procedure Notes (Signed)
Spinal  Patient location during procedure: OR Staffing Performed: anesthesiologist  Anesthesiologist: Chanceler Pullin E, MD Preanesthetic Checklist Completed: patient identified, IV checked, risks and benefits discussed, surgical consent, monitors and equipment checked, pre-op evaluation and timeout performed Spinal Block Patient position: sitting Prep: DuraPrep and site prepped and draped Patient monitoring: continuous pulse ox, blood pressure and heart rate Approach: midline Location: L3-4 Injection technique: single-shot Needle Needle type: Pencan  Needle gauge: 24 G Needle length: 9 cm Additional Notes Functioning IV was confirmed and monitors were applied. Sterile prep and drape, including hand hygiene and sterile gloves were used. The patient was positioned and the spine was prepped. The skin was anesthetized with lidocaine.  Free flow of clear CSF was obtained prior to injecting local anesthetic into the CSF. The needle was carefully withdrawn. The patient tolerated the procedure well.      

## 2019-08-04 NOTE — Anesthesia Preprocedure Evaluation (Signed)
Anesthesia Evaluation  Patient identified by MRN, date of birth, ID band Patient awake    Reviewed: Allergy & Precautions, NPO status , Patient's Chart, lab work & pertinent test results  History of Anesthesia Complications Negative for: history of anesthetic complications  Airway Mallampati: II  TM Distance: >3 FB Neck ROM: Full    Dental   Pulmonary neg pulmonary ROS, former smoker,    Pulmonary exam normal        Cardiovascular negative cardio ROS Normal cardiovascular exam     Neuro/Psych negative neurological ROS  negative psych ROS   GI/Hepatic Neg liver ROS, GERD  ,  Endo/Other  negative endocrine ROS  Renal/GU Renal InsufficiencyRenal disease  negative genitourinary   Musculoskeletal  (+) Arthritis ,   Abdominal   Peds  Hematology negative hematology ROS (+)   Anesthesia Other Findings   Reproductive/Obstetrics                             Anesthesia Physical Anesthesia Plan  ASA: II  Anesthesia Plan: Spinal   Post-op Pain Management:    Induction:   PONV Risk Score and Plan: 1 and Propofol infusion, Treatment may vary due to age or medical condition, Ondansetron and TIVA  Airway Management Planned: Nasal Cannula and Simple Face Mask  Additional Equipment: None  Intra-op Plan:   Post-operative Plan:   Informed Consent: I have reviewed the patients History and Physical, chart, labs and discussed the procedure including the risks, benefits and alternatives for the proposed anesthesia with the patient or authorized representative who has indicated his/her understanding and acceptance.       Plan Discussed with:   Anesthesia Plan Comments:         Anesthesia Quick Evaluation

## 2019-08-04 NOTE — Anesthesia Procedure Notes (Signed)
Anesthesia Regional Block: Adductor canal block   Pre-Anesthetic Checklist: ,, timeout performed, Correct Patient, Correct Site, Correct Laterality, Correct Procedure, Correct Position, site marked, Risks and benefits discussed,  Surgical consent,  Pre-op evaluation,  At surgeon's request and post-op pain management  Laterality: Right  Prep: chloraprep       Needles:  Injection technique: Single-shot  Needle Type: Echogenic Stimulator Needle     Needle Length: 10cm  Needle Gauge: 20     Additional Needles:   Procedures:,,,, ultrasound used (permanent image in chart),,,,  Narrative:  Start time: 08/04/2019 9:27 AM End time: 08/04/2019 9:33 AM Injection made incrementally with aspirations every 5 mL.  Performed by: Personally  Anesthesiologist: Lucretia Kern, MD  Additional Notes: Standard monitors applied. Skin prepped. Good needle visualization with ultrasound. Injection made in 5cc increments with no resistance to injection. Patient tolerated the procedure well.

## 2019-08-04 NOTE — Transfer of Care (Signed)
Immediate Anesthesia Transfer of Care Note  Patient: Brett [name]  Procedure(s) Performed: RIGHT TOTAL KNEE ARTHROPLASTY (Right Knee)  Patient Location: PACU  Anesthesia Type:Spinal  Level of Consciousness: awake, drowsy and patient cooperative  Airway & Oxygen Therapy: Patient Spontanous Breathing  Post-op Assessment: Report given to RN and Post -op Vital signs reviewed and stable  Post vital signs: Reviewed and stable  Last Vitals:  Vitals Value Taken Time  BP 107/70 08/04/19 1232  Temp    Pulse 66 08/04/19 1233  Resp 22 08/04/19 1233  SpO2 98 % 08/04/19 1233  Vitals shown include unvalidated device data.  Last Pain:  Vitals:   08/04/19 0849  TempSrc:   PainSc: 0-No pain      Patients Stated Pain Goal: 3 (08/04/19 0849)  Complications: No complications documented.

## 2019-08-04 NOTE — Op Note (Signed)
Total Knee Arthroplasty Procedure Note  Preoperative diagnosis: Right knee osteoarthritis  Postoperative diagnosis:same  Operative procedure: Right total knee arthroplasty. CPT [phone]  Surgeon: N. Glee Arvin, MD  Assist: Oneal Grout, PA-C; necessary for the timely completion of procedure and due to complexity of procedure.  Anesthesia: Spinal, regional  Tourniquet time: spinal, local, regional  Implants used: [employer] Triatholon Femur: CR 5 Tibia: 5 Patella: 35 mm Polyethylene: 11 mm, UC  Indication: Brett [name] is a 59 y.o. year old male with a history of knee pain. Having failed conservative management, the patient elected to proceed with a total knee arthroplasty.  We have reviewed the risk and benefits of the surgery and they elected to proceed after voicing understanding.  Procedure:  After informed consent was obtained and understanding of the risk were voiced including but not limited to bleeding, infection, damage to surrounding structures including nerves and vessels, blood clots, leg length inequality and the failure to achieve desired results, the operative extremity was marked with verbal confirmation of the patient in the holding area.   The patient was then brought to the operating room and transported to the operating room table in the supine position.  A tourniquet was applied to the operative extremity around the upper thigh. The operative limb was then prepped and draped in the usual sterile fashion and preoperative antibiotics were administered.  A time out was performed prior to the start of surgery confirming the correct extremity, preoperative antibiotic administration, as well as team members, implants and instruments available for the case. Correct surgical site was also confirmed with preoperative radiographs. The limb was then elevated for exsanguination and the tourniquet was inflated. A midline incision was made and a standard medial  parapatellar approach was performed.  The patella was prepared and sized to a 35 mm.  A cover was placed on the patella for protection from retractors.  We then turned our attention to the femur. Posterior cruciate ligament was sacrificed. Start site was drilled in the femur and the intramedullary distal femoral cutting guide was placed, set at 5 degrees valgus, taking 9 mm of distal resection. The distal cut was made. Osteophytes were then removed.  Extension gap was then checked. Next, the proximal tibial cutting guide was placed with appropriate slope, varus/valgus alignment and depth of resection. The proximal tibial cut was made. Gap blocks were then used to assess the extension gap and alignment, and appropriate soft tissue releases were performed. Attention was turned back to the femur, which was sized using the sizing guide to a size 5. Appropriate rotation of the femoral component was determined using epicondylar axis, Whiteside's line, and assessing the flexion gap under ligament tension. The appropriate size 4-in-1 cutting block was placed and cuts were made.  The box was then cut for the PS component.  Posterior femoral osteophytes and uncapped bone were then removed with the curved osteotome.  Trial components were placed, and stability was checked in full extension, mid-flexion, and deep flexion. Proper tibial rotation was determined and marked.  The patella tracked well without a lateral release. Trial components were then removed and tibial preparation performed.  The tibia was sized for a size 5 component.  A posterior capsular injection comprising of 20 cc of 1.3% exparel, 20 cc of 0.25% bupivicaine with epi and 20 cc of normal saline was performed for postoperative pain control. The bony surfaces were irrigated with a pulse lavage and then dried. Bone cement was vacuum mixed on the back  table, and the final components sized above were cemented into place. After cement had finished curing,  excess cement was removed. The stability of the construct was re-evaluated throughout a range of motion and found to be acceptable. The trial liner was removed, the knee was copiously irrigated, and the knee was re-evaluated for any excess bone debris. The real polyethylene liner, 11 mm thick, was inserted and checked to ensure the locking mechanism had engaged appropriately. The tourniquet was deflated and hemostasis was achieved. The wound was irrigated with normal saline.  One gram of vancomycin powder was placed in the surgical bed.  Capsular closure was performed with a #1 vicryl, subcutaneous fat closed with a 0 vicryl suture, then subcutaneous tissue closed with interrupted 2.0 vicryl suture. The skin was then closed with a 4.0 monocryl. A sterile dressing was applied.  The patient was awakened in the operating room and taken to recovery in stable condition. All sponge, needle, and instrument counts were correct at the end of the case.  Position: supine  Complications: none.  Time Out: performed   Drains/Packing: none  Estimated blood loss: minimal  Returned to Recovery Room: in good condition.   Antibiotics: yes   Mechanical VTE (DVT) Prophylaxis: sequential compression devices, TED thigh-high  Chemical VTE (DVT) Prophylaxis: aspirin  Fluid Replacement  Crystalloid: see anesthesia record Blood: none  FFP: none   Specimens Removed: 1 to pathology   Sponge and Instrument Count Correct? yes   PACU: portable radiograph - knee AP and Lateral   Plan/RTC: Return in 2 weeks for wound check.   Weight Bearing/Load Lower Extremity: full   N. Glee Arvin, MD [facility] 11:54 AM

## 2019-08-05 ENCOUNTER — Encounter (HOSPITAL_COMMUNITY): Payer: Self-pay | Admitting: Orthopaedic Surgery

## 2019-08-05 LAB — GLUCOSE, CAPILLARY
Glucose-Capillary: 99 mg/dL (ref 70–99)
Glucose-Capillary: 99 mg/dL (ref 70–99)
Glucose-Capillary: 124 mg/dL — ABNORMAL HIGH (ref 70–99)
Glucose-Capillary: 175 mg/dL — ABNORMAL HIGH (ref 70–99)
Glucose-Capillary: 140 mg/dL — ABNORMAL HIGH (ref 70–99)

## 2019-08-05 LAB — CBC
HCT: 39.9 % (ref 39.0–52.0)
Hemoglobin: 12.6 g/dL — ABNORMAL LOW (ref 13.0–17.0)
MCH: 26 pg (ref 26.0–34.0)
MCHC: 31.6 g/dL (ref 30.0–36.0)
MCV: 82.4 fL (ref 80.0–100.0)
Platelets: 224 10*3/uL (ref 150–400)
RBC: 4.84 MIL/uL (ref 4.22–5.81)
RDW: 13.1 % (ref 11.5–15.5)
WBC: 12.3 10*3/uL — ABNORMAL HIGH (ref 4.0–10.5)
nRBC: 0 % (ref 0.0–0.2)

## 2019-08-05 LAB — BASIC METABOLIC PANEL
Anion gap: 9 (ref 5–15)
BUN: 11 mg/dL (ref 6–20)
CO2: 21 mmol/L — ABNORMAL LOW (ref 22–32)
Calcium: 8.8 mg/dL — ABNORMAL LOW (ref 8.9–10.3)
Chloride: 108 mmol/L (ref 98–111)
Creatinine, Ser: 1.08 mg/dL (ref 0.61–1.24)
GFR calc Af Amer: >60 mL/min (ref >60)
GFR calc non Af Amer: >60 mL/min (ref >60)
Glucose, Bld: 123 mg/dL — ABNORMAL HIGH (ref 70–99)
Potassium: 4.4 mmol/L (ref 3.5–5.1)
Sodium: 138 mmol/L (ref 135–145)

## 2019-08-05 NOTE — Progress Notes (Signed)
Physical Therapy Treatment Patient Details Name: Brett [name] MRN: [id number] DOB: [birth year] Today's Date: 08/05/2019    History of Present Illness Pt is a 59 y/o male s/p R TKA. PMH includes pre-diabetes and L TKA.     PT Comments    Pt in chair on arrival.  Focused on gt progression and post session assisted patient into CPM -2-70 degrees.  Plan for stair training in am.     Follow Up Recommendations  Follow surgeon's recommendation for DC plan and follow-up therapies     Equipment Recommendations  None recommended by PT    Recommendations for Other Services       Precautions / Restrictions Precautions Precautions: Knee Precaution Booklet Issued: No Precaution Comments: Reviewed knee precautions with pt.  Restrictions Weight Bearing Restrictions: Yes RLE Weight Bearing: Weight bearing as tolerated    Mobility  Bed Mobility Overal bed mobility: Needs Assistance Bed Mobility: Sit to Supine       Sit to supine: Supervision   General bed mobility comments: Supervision for safety, no assistance to return back to bed.  Transfers Overall transfer level: Needs assistance Equipment used: Rolling walker (2 wheeled)   Sit to Stand: Min guard         General transfer comment: Min guard with cues for hand and foot placement.  Ambulation/Gait Ambulation/Gait assistance: Min guard Gait Distance (Feet): 60 Feet Assistive device: Rolling walker (2 wheeled) Gait Pattern/deviations: Step-to pattern;Antalgic;Decreased stance time - right;Trunk flexed     General Gait Details: Cues for upper trunk control and sequencing.  Pt antalgic with cues to correct to promote gt symmetry.   Stairs             Wheelchair Mobility    Modified Rankin (Stroke Patients Only)       Balance Overall balance assessment: Needs assistance   Sitting balance-Leahy Scale: Good       Standing balance-Leahy Scale: Fair                              Cognition  Arousal/Alertness: Awake/alert Behavior During Therapy: WFL for tasks assessed/performed Overall Cognitive Status: Within Functional Limits for tasks assessed                                        Exercises      General Comments        Pertinent Vitals/Pain Pain Assessment: 0-10 Faces Pain Scale: Hurts whole lot Pain Location: R knee  Pain Descriptors / Indicators: Guarding;Grimacing;Operative site guarding Pain Intervention(s): Monitored during session;Repositioned    Home Living                      Prior Function            PT Goals (current goals can now be found in the care plan section) Acute Rehab PT Goals Patient Stated Goal: To decrease pain  Potential to Achieve Goals: Good Progress towards PT goals: Progressing toward goals    Frequency    7X/week      PT Plan Current plan remains appropriate    Co-evaluation              AM-PAC PT "6 Clicks" Mobility   Outcome Measure  Help needed turning from your back to your side while in a flat bed without using bedrails?: None  Help needed moving from lying on your back to sitting on the side of a flat bed without using bedrails?: None Help needed moving to and from a bed to a chair (including a wheelchair)?: A Little Help needed standing up from a chair using your arms (e.g., wheelchair or bedside chair)?: A Little Help needed to walk in hospital room?: A Little Help needed climbing 3-5 steps with a railing? : A Little 6 Click Score: 20    End of Session Equipment Utilized During Treatment: Gait belt Activity Tolerance: Patient limited by pain Patient left: in bed;with call bell/phone within reach Nurse Communication: Mobility status PT Visit Diagnosis: Pain;Other abnormalities of gait and mobility (R26.89);Difficulty in walking, not elsewhere classified (R26.2) Pain - Right/Left: Right Pain - part of body: Knee     Time: 0737-1062 PT Time Calculation (min) (ACUTE  ONLY): 47 min  Charges:  $Gait Training: 8-22 mins $Therapeutic Exercise: 8-22 mins $Therapeutic Activity: 8-22 mins                     Bonney Leitz , PTA Acute Rehabilitation Services Pager [phone] Office [phone]     Brett [name] 08/05/2019, 6:01 PM

## 2019-08-05 NOTE — Progress Notes (Signed)
Subjective: 1 Day Post-Op Procedure(s) (LRB): RIGHT TOTAL KNEE ARTHROPLASTY (Right) Patient reports pain as mild.    Objective: Vital signs in last 24 hours: Temp:  [97 F (36.1 C)-98.5 F (36.9 C)] 98.5 F (36.9 C) (08/03 0349) Pulse Rate:  [32-77] 77 (08/03 0349) Resp:  [11-22] 17 (08/03 0349) BP: (99-151)/(65-94) 117/77 (08/03 0349) SpO2:  [90 %-100 %] 95 % (08/03 0349) Weight:  [78.5 kg] 78.5 kg (08/02 0824)  Intake/Output from previous day: 08/02 0701 - 08/03 0700 In: 1762 [P.O.:240; I.V.:1222; IV Piggyback:300] Out: 750 [Urine:650; Blood:100] Intake/Output this shift: No intake/output data recorded.  Recent Labs    08/05/19 0302  HGB 12.6*   Recent Labs    08/05/19 0302  WBC 12.3*  RBC 4.84  HCT 39.9  PLT 224   Recent Labs    08/05/19 0302  NA 138  K 4.4  CL 108  CO2 21*  BUN 11  CREATININE 1.08  GLUCOSE 123*  CALCIUM 8.8*   No results for input(s): LABPT, INR in the last 72 hours.  Neurologically intact Neurovascular intact Sensation intact distally Intact pulses distally Dorsiflexion/Plantar flexion intact Incision: dressing C/D/I No cellulitis present Compartment soft   Assessment/Plan: 1 Day Post-Op Procedure(s) (LRB): RIGHT TOTAL KNEE ARTHROPLASTY (Right) Advance diet Up with therapy D/C IV fluids Discharge home with home health either today vs tomorrow (depending on how well he progresses with PT) WBAT RLE ABLA- mild and stable D/c foley   Anticipated LOS equal to or greater than 2 midnights due to - Age 59 and older with one or more of the following:  - Obesity  - Expected need for hospital services (PT, OT, Nursing) required for safe  discharge  - Anticipated need for postoperative skilled nursing care or inpatient rehab  - Active co-morbidities: None OR   - Unanticipated findings during/Post Surgery: Slow post-op progression: GI, pain control, mobility  - Patient is a high risk of re-admission due to: None    Cristie Hem 08/05/2019, 7:53 AM

## 2019-08-05 NOTE — TOC Initial Note (Addendum)
Transition of Care [facility]) - Initial/Assessment Note    Patient Details  Name: Brett [name] MRN: [id number] Date of Birth: 1960-08-31  [facility]) CM/SW Contact:    Epifanio Lesches, RN Phone Number: 08/05/2019, 1:30 PM  Clinical Narrative:          Pt s/p R TKA 8/2, hx of pre-diabetes and L TKA. From home with wife. Pt states independent with ADL's PTA . DME, no usage prior to admit.   TOC team following for needs....  Expected Discharge Plan: Home w Home Health Services Barriers to Discharge: Continued Medical Work up   Patient Goals and CMS Choice     Choice offered to / list presented to : Patient  Expected Discharge Plan and Services Expected Discharge Plan: Home w Home Health Services     Post Acute Care Choice: Durable Medical Equipment, Home Health                   DME Arranged: CPM, per [employer] rep. equipment will be delivered to pt home.         HH Arranged: PT HH Agency: [facility] Date HH Agency Contacted: 08/05/19 Time HH Agency Contacted: 1330 Representative spoke with at [facility] Agency: Tiffany  Prior Living Arrangements/Services       Do you feel safe going back to the place where you live?: Yes               Activities of Daily Living Home Assistive Devices/Equipment: Eyeglasses, Cane (specify quad or straight), CBG Meter ADL Screening (condition at time of admission) Patient's cognitive ability adequate to safely complete daily activities?: Yes Is the patient deaf or have difficulty hearing?: No Does the patient have difficulty seeing, even when wearing glasses/contacts?: No Does the patient have difficulty concentrating, remembering, or making decisions?: No Patient able to express need for assistance with ADLs?: Yes Does the patient have difficulty dressing or bathing?: No Independently performs ADLs?: Yes (appropriate for developmental age) Does the patient have difficulty walking or  climbing stairs?: No Weakness of Legs: Right Weakness of Arms/Hands: None  Permission Sought/Granted                  Emotional Assessment              Admission diagnosis:  Status post total knee replacement, right [Z96.651] Patient Active Problem List   Diagnosis Date Noted  . Status post total knee replacement, right 08/04/2019  . Status post total left knee replacement 01/24/2019  . Primary osteoarthritis of right knee 12/17/2018  . Overweight (BMI 25.0-29.9) 08/08/2017  . Medication management 03/23/2014  . Vitamin D deficiency 03/23/2014  . Abnormal glucose 03/23/2014  . Hyperlipemia 10/31/2006  . GERD 10/31/2006   PCP:  Lucky Cowboy, MD Pharmacy:   [facility] [address] [place]), [place] - [address] [place] ([place] [address] Phone: [phone] Fax: [phone]  [facility] [place] - [address] [facility] [address] [place] [address] Phone: [phone] Fax: [phone]     Social Determinants of Health (SDOH) Interventions    Readmission Risk Interventions No flowsheet data found.

## 2019-08-05 NOTE — Progress Notes (Signed)
Physical Therapy Treatment Patient Details Name: Brett [name] MRN: [id number] DOB: Aug 14, 1960 Today's Date: 08/05/2019    History of Present Illness Pt is a 59 y/o male s/p R TKA. PMH includes pre-diabetes and L TKA.     PT Comments    Pt supine in bed on arrival.  He has not mobilized OOB since surgery.  Pt tolerated progression well this am and able to ambulate to his door and back.  Plan for return this pm to progress mobility.  Continue to follow surgeon's d/c recommendations.      Follow Up Recommendations  Follow surgeon's recommendation for DC plan and follow-up therapies     Equipment Recommendations  None recommended by PT    Recommendations for Other Services       Precautions / Restrictions Precautions Precautions: Knee Precaution Booklet Issued: No Precaution Comments: Reviewed knee precautions with pt.  Restrictions Weight Bearing Restrictions: Yes RLE Weight Bearing: Weight bearing as tolerated    Mobility  Bed Mobility Overal bed mobility: Needs Assistance Bed Mobility: Supine to Sit;Sit to Supine     Supine to sit: Supervision     General bed mobility comments: Supervision for safety, no assistance needed this session.  Transfers Overall transfer level: Needs assistance Equipment used: Rolling walker (2 wheeled) Transfers: Sit to/from Stand Sit to Stand: Min assist         General transfer comment: Heavy min assistance, pt with poor weight shifting forward and required cues for hand placement.  Ambulation/Gait Ambulation/Gait assistance: Min guard Gait Distance (Feet): 25 Feet Assistive device: Rolling walker (2 wheeled) Gait Pattern/deviations: Step-to pattern;Antalgic;Decreased stance time - right;Trunk flexed     General Gait Details: Cues for upper trunk control and sequencing.  Pt antalgic with cues to correct to promote gt symmetry.   Stairs             Wheelchair Mobility    Modified Rankin (Stroke Patients Only)        Balance Overall balance assessment: Needs assistance   Sitting balance-Leahy Scale: Good       Standing balance-Leahy Scale: Fair                              Cognition Arousal/Alertness: Awake/alert Behavior During Therapy: WFL for tasks assessed/performed Overall Cognitive Status: Within Functional Limits for tasks assessed                                        Exercises Total Joint Exercises Ankle Circles/Pumps: AROM;Both;20 reps;Supine Quad Sets: AROM;Right;10 reps;Supine Heel Slides: AAROM;Right;10 reps;Supine Hip ABduction/ADduction: AAROM;Right;10 reps;Supine Straight Leg Raises: AAROM;Right;10 reps;Supine Goniometric ROM: 5-65 degrees R knee ROM.    General Comments        Pertinent Vitals/Pain Pain Assessment: 0-10 Pain Score: 9  Pain Location: R knee  Pain Descriptors / Indicators: Guarding;Grimacing;Operative site guarding Pain Intervention(s): Monitored during session;Repositioned;Ice applied    Home Living                      Prior Function            PT Goals (current goals can now be found in the care plan section) Acute Rehab PT Goals Patient Stated Goal: To decrease pain  Potential to Achieve Goals: Good Progress towards PT goals: Progressing toward goals    Frequency  7X/week      PT Plan Current plan remains appropriate    Co-evaluation              AM-PAC PT "6 Clicks" Mobility   Outcome Measure  Help needed turning from your back to your side while in a flat bed without using bedrails?: A Little Help needed moving from lying on your back to sitting on the side of a flat bed without using bedrails?: A Little Help needed moving to and from a bed to a chair (including a wheelchair)?: A Lot Help needed standing up from a chair using your arms (e.g., wheelchair or bedside chair)?: A Lot Help needed to walk in hospital room?: A Lot Help needed climbing 3-5 steps with a railing? :  A Lot 6 Click Score: 14    End of Session Equipment Utilized During Treatment: Gait belt Activity Tolerance: Patient limited by pain Patient left: in bed;with call bell/phone within reach Nurse Communication: Mobility status PT Visit Diagnosis: Pain;Other abnormalities of gait and mobility (R26.89);Difficulty in walking, not elsewhere classified (R26.2) Pain - Right/Left: Right Pain - part of body: Knee     Time: 9476-5465 PT Time Calculation (min) (ACUTE ONLY): 28 min  Charges:  $Gait Training: 8-22 mins $Therapeutic Exercise: 8-22 mins                     Bonney Leitz , PTA Acute Rehabilitation Services Pager [phone] Office [phone]     Malachi Kinzler Artis Delay 08/05/2019, 12:21 PM

## 2019-08-06 LAB — GLUCOSE, CAPILLARY
Glucose-Capillary: 100 mg/dL — ABNORMAL HIGH (ref 70–99)
Glucose-Capillary: 91 mg/dL (ref 70–99)

## 2019-08-06 NOTE — Discharge Summary (Signed)
Patient ID: Brett [name] MRN: [id number] DOB/AGE: Sep 24, 1960 59 y.o.  Admit date: 08/04/2019 Discharge date: 08/06/2019  Admission Diagnoses:  Active Problems:   Primary osteoarthritis of right knee   Status post total knee replacement, right   Discharge Diagnoses:  Same  Past Medical History:  Diagnosis Date  . Allergy    albuterol as needed  . Anal fissure   . Arthritis   . Colon polyps 01/13/11   Colonoscopy  . GERD (gastroesophageal reflux disease)   . Pneumonia   . Pre-diabetes     Surgeries: Procedure(s): RIGHT TOTAL KNEE ARTHROPLASTY on 08/04/2019   Consultants:   Discharged Condition: Improved  Hospital Course: Brett [name] is an 59 y.o. male who was admitted 08/04/2019 for operative treatment of<principal problem not specified>. Patient has severe unremitting pain that affects sleep, daily activities, and work/hobbies. After pre-op clearance the patient was taken to the operating room on 08/04/2019 and underwent  Procedure(s): RIGHT TOTAL KNEE ARTHROPLASTY.    Patient was given perioperative antibiotics:  Anti-infectives (From admission, onward)   Start     Dose/Rate Route Frequency Ordered Stop   08/04/19 1630  ceFAZolin (ANCEF) IVPB 2g/100 mL premix        2 g 200 mL/hr over 30 Minutes Intravenous Every 6 hours 08/04/19 1443 08/05/19 0500   08/04/19 1114  vancomycin (VANCOCIN) powder  Status:  Discontinued          As needed 08/04/19 1115 08/04/19 1227   08/04/19 0845  ceFAZolin (ANCEF) IVPB 2g/100 mL premix        2 g 200 mL/hr over 30 Minutes Intravenous On call to O.R. 08/04/19 0835 08/04/19 1043   08/04/19 0000  sulfamethoxazole-trimethoprim (BACTRIM DS) 800-160 MG tablet     Discontinue     1 tablet Oral 2 times daily 08/04/19 0910         Patient was given sequential compression devices, early ambulation, and chemoprophylaxis to prevent DVT.  Patient benefited maximally from hospital stay and there were no complications.    Recent vital signs:   Patient Vitals for the past 24 hrs:  BP Temp Temp src Pulse Resp SpO2  08/06/19 0355 130/85 97.6 F (36.4 C) Oral 73 16 98 %  08/05/19 1947 125/80 98.5 F (36.9 C) Oral 73 16 96 %  08/05/19 1401 133/81 98.3 F (36.8 C) Oral 64 17 98 %  08/05/19 0835 117/71 98.3 F (36.8 C) Oral 85 17 99 %     Recent laboratory studies:  Recent Labs    08/05/19 0302  WBC 12.3*  HGB 12.6*  HCT 39.9  PLT 224  NA 138  K 4.4  CL 108  CO2 21*  BUN 11  CREATININE 1.08  GLUCOSE 123*  CALCIUM 8.8*     Discharge Medications:   Allergies as of 08/06/2019      Reactions   Benzodiazepines Hives   Chantix  [varenicline] Hives   Cialis [tadalafil]    Hives   Dicyclomine Hives   Meloxicam    Rash   Penicillins Hives   DID THE REACTION INVOLVE: Swelling of the face/tongue/throat, SOB, or low BP? Unknown Sudden or severe rash/hives, skin peeling, or the inside of the mouth or nose? Unknown Did it require medical treatment? n When did it last happen?Over 5 years ago. If all above answers are "NO", may proceed with cephalosporin use.   Relafen [nabumetone]    Hives      Medication List    STOP taking these medications  Acetaminophen-Codeine 300-30 MG tablet   ibuprofen 800 MG tablet Commonly known as: ADVIL     TAKE these medications   aspirin EC 81 MG tablet Take 1 tablet (81 mg total) by mouth 2 (two) times daily.   blood glucose meter kit and supplies Test blood sugar twice daily or as directed by YOUR provider.   blood glucose meter kit and supplies Dispense based insurance preference. E11.22   citalopram 20 MG tablet Commonly known as: CeleXA Take 1 tablet (20 mg total) by mouth daily.   docusate sodium 100 MG capsule Commonly known as: Colace Take 1 capsule (100 mg total) by mouth daily as needed.   gabapentin 600 MG tablet Commonly known as: NEURONTIN Take 600 mg by mouth 2 (two) times daily as needed (pain).   glucose blood test strip Use as instructed    Melatonin 10 MG Tabs Take 10 mg by mouth at bedtime as needed (sleep).   methocarbamol 500 MG tablet Commonly known as: Robaxin Take 1 tablet (500 mg total) by mouth 2 (two) times daily as needed.   oxyCODONE 5 MG immediate release tablet Commonly known as: Roxicodone Take 1-2 tabs po every 6-8 hours prn pain   sulfamethoxazole-trimethoprim 800-160 MG tablet Commonly known as: BACTRIM DS Take 1 tablet by mouth 2 (two) times daily.   Vitamin D-3 125 MCG (5000 UT) Tabs Take 5,000 Units by mouth daily.            Durable Medical Equipment  (From admission, onward)         Start     Ordered   08/04/19 1444  DME Walker rolling  Once       Question:  Patient needs a walker to treat with the following condition  Answer:  Total knee replacement status   08/04/19 1443   08/04/19 1444  DME 3 n 1  Once        08/04/19 1443   08/04/19 1444  DME Bedside commode  Once       Question:  Patient needs a bedside commode to treat with the following condition  Answer:  Total knee replacement status   08/04/19 1443          Diagnostic Studies: DG Chest 2 View  Result Date: 07/31/2019 CLINICAL DATA:  Preop evaluation for upcoming knee surgery EXAM: CHEST - 2 VIEW COMPARISON:  None. FINDINGS: The heart size and mediastinal contours are within normal limits. Both lungs are clear. The visualized skeletal structures are unremarkable. IMPRESSION: No active cardiopulmonary disease. Electronically Signed   By: Inez Catalina M.D.   On: 07/31/2019 19:28   DG Knee Right Port  Result Date: 08/04/2019 CLINICAL DATA:  Postop total right knee arthroplasty. EXAM: PORTABLE RIGHT KNEE - 1-2 VIEW COMPARISON:  12/25/2017 FINDINGS: Well seated components of a total knee arthroplasty. No complicating features are identified. IMPRESSION: Well seated components of a total knee arthroplasty. Electronically Signed   By: Marijo Sanes M.D.   On: 08/04/2019 13:33   XR Knee 1-2 Views Left  Result Date:  07/26/2019 Stable total knee replacement in good alignment.    Disposition: Discharge disposition: 01-Home or Self Care          Follow-up Information    Leandrew Koyanagi, MD. Schedule an appointment as soon as possible for a visit in 2 week(s).   Specialty: Orthopedic Surgery Contact information: [place] [address] [phone]        [facility] Follow up.  Specialty: Home Health Services Why: home health services arranged Contact information: [place] [address] [phone]                Signed: Aundra Dubin 08/06/2019, 8:01 AM

## 2019-08-06 NOTE — Progress Notes (Addendum)
Physical Therapy Treatment Patient Details Name: Brett [name] MRN: [id number] DOB: March 04, 1960 Today's Date: 08/06/2019    History of Present Illness Pt is a 59 y/o male s/p R TKA. PMH includes pre-diabetes and L TKA.     PT Comments    Followed up for pm session with focus on gt and stair training.  Verbally reviewed HEP and knee precautions.  Pt is eager to return home.  RN informed he is ready for d/c home. Ride will be here @ 14:30 and RN informed.     Follow Up Recommendations  Follow surgeon's recommendation for DC plan and follow-up therapies     Equipment Recommendations  None recommended by PT    Recommendations for Other Services       Precautions / Restrictions Precautions Precautions: Knee Precaution Booklet Issued: No Precaution Comments: Reviewed knee precautions with pt.  Restrictions Weight Bearing Restrictions: Yes RLE Weight Bearing: Weight bearing as tolerated    Mobility  Bed Mobility Overal bed mobility: Needs Assistance Bed Mobility: Supine to Sit     Supine to sit: Supervision    General bed mobility comments: Utilized LLE to hook RLE to achieve into and out of the bed.  Transfers Overall transfer level: Needs assistance Equipment used: Rolling walker (2 wheeled) Transfers: Sit to/from Stand Sit to Stand: Supervision         General transfer comment: for safety  Ambulation/Gait Ambulation/Gait assistance: Supervision Gait Distance (Feet): 100 Feet Assistive device: Rolling walker (2 wheeled) Gait Pattern/deviations: Step-to pattern;Antalgic;Decreased stance time - right;Trunk flexed;Step-through pattern     General Gait Details: Cues for progression to step through pattern this afternoon.  Use of UEs in R stance phase to ease pain.   Stairs Stairs: Yes Stairs assistance: Min assist Stair Management: No rails;Backwards;With walker Number of Stairs: 2 General stair comments: Cues for sequencing and RW placement.  Backward to  ascend and forwards to descend.   Wheelchair Mobility    Modified Rankin (Stroke Patients Only)       Balance Overall balance assessment: Needs assistance   Sitting balance-Leahy Scale: Good       Standing balance-Leahy Scale: Fair                              Cognition Arousal/Alertness: Awake/alert Behavior During Therapy: WFL for tasks assessed/performed Overall Cognitive Status: Within Functional Limits for tasks assessed                                        Exercises     General Comments        Pertinent Vitals/Pain Pain Assessment: 0-10 Pain Score: 7  Faces Pain Scale: Hurts whole lot Pain Location: R knee  Pain Descriptors / Indicators: Guarding;Grimacing;Operative site guarding Pain Intervention(s): Monitored during session;Repositioned;Ice applied    Home Living                      Prior Function            PT Goals (current goals can now be found in the care plan section) Acute Rehab PT Goals Patient Stated Goal: To decrease pain  Potential to Achieve Goals: Good Progress towards PT goals: Progressing toward goals    Frequency    7X/week      PT Plan Current plan remains appropriate  Co-evaluation              AM-PAC PT "6 Clicks" Mobility   Outcome Measure  Help needed turning from your back to your side while in a flat bed without using bedrails?: None Help needed moving from lying on your back to sitting on the side of a flat bed without using bedrails?: None Help needed moving to and from a bed to a chair (including a wheelchair)?: A Little Help needed standing up from a chair using your arms (e.g., wheelchair or bedside chair)?: A Little Help needed to walk in hospital room?: A Little Help needed climbing 3-5 steps with a railing? : A Little 6 Click Score: 20    End of Session Equipment Utilized During Treatment: Gait belt Activity Tolerance: Patient limited by pain Patient  left: with call bell/phone within reach;in chair Nurse Communication: Mobility status PT Visit Diagnosis: Pain;Other abnormalities of gait and mobility (R26.89);Difficulty in walking, not elsewhere classified (R26.2) Pain - Right/Left: Right Pain - part of body: Knee     Time: 1311-1341 PT Time Calculation (min) (ACUTE ONLY): 30 min  Charges:  $Gait Training: 8-22 mins $Therapeutic Activity: 8-22 mins                     Bonney Leitz , PTA Acute Rehabilitation Services Pager [phone] Office [phone]     Terrionna Bridwell Artis Delay 08/06/2019, 2:03 PM

## 2019-08-06 NOTE — Progress Notes (Signed)
Subjective: 2 Days Post-Op Procedure(s) (LRB): RIGHT TOTAL KNEE ARTHROPLASTY (Right) Patient reports pain as moderate.    Objective: Vital signs in last 24 hours: Temp:  [97.6 F (36.4 C)-98.5 F (36.9 C)] 97.6 F (36.4 C) (08/04 0355) Pulse Rate:  [64-85] 73 (08/04 0355) Resp:  [16-17] 16 (08/04 0355) BP: (117-133)/(71-85) 130/85 (08/04 0355) SpO2:  [96 %-99 %] 98 % (08/04 0355)  Intake/Output from previous day: 08/03 0701 - 08/04 0700 In: 240 [P.O.:240] Out: 300 [Urine:300] Intake/Output this shift: No intake/output data recorded.  Recent Labs    08/05/19 0302  HGB 12.6*   Recent Labs    08/05/19 0302  WBC 12.3*  RBC 4.84  HCT 39.9  PLT 224   Recent Labs    08/05/19 0302  NA 138  K 4.4  CL 108  CO2 21*  BUN 11  CREATININE 1.08  GLUCOSE 123*  CALCIUM 8.8*   No results for input(s): LABPT, INR in the last 72 hours.  Neurologically intact Neurovascular intact Sensation intact distally Intact pulses distally Dorsiflexion/Plantar flexion intact Incision: dressing C/D/I No cellulitis present Compartment soft   Assessment/Plan: 2 Days Post-Op Procedure(s) (LRB): RIGHT TOTAL KNEE ARTHROPLASTY (Right) Advance diet Up with therapy D/C IV fluids Discharge home with home health after second PT session WBAT RLE ABLA- mild and stable   Anticipated LOS equal to or greater than 2 midnights due to - Age 34 and older with one or more of the following:  - Obesity  - Expected need for hospital services (PT, OT, Nursing) required for safe  discharge  - Anticipated need for postoperative skilled nursing care or inpatient rehab  - Active co-morbidities: None OR   - Unanticipated findings during/Post Surgery: Slow post-op progression: GI, pain control, mobility  - Patient is a high risk of re-admission due to: None    Cristie Hem 08/06/2019, 7:59 AM

## 2019-08-06 NOTE — Plan of Care (Signed)

## 2019-08-06 NOTE — Progress Notes (Signed)
Physical Therapy Treatment Patient Details Name: Gervis Gaba MRN: [id number] DOB: 01/19/1960 Today's Date: 08/06/2019    History of Present Illness Pt is a 59 y/o male s/p R TKA. PMH includes pre-diabetes and L TKA.     PT Comments    Pt supine in bed on arrival this session in CPM.  CPM fitting poorly and removed for therapy, replaced CPM with correct fit post session and increased to 75 degrees flexion.  Pt performed stair training to prepare for return home.  His gait remains slow and guarded due to pain.     Follow Up Recommendations  Follow surgeon's recommendation for DC plan and follow-up therapies     Equipment Recommendations  None recommended by PT    Recommendations for Other Services       Precautions / Restrictions Precautions Precautions: Knee Precaution Booklet Issued: No Precaution Comments: Reviewed knee precautions with pt.  Restrictions Weight Bearing Restrictions: Yes RLE Weight Bearing: Weight bearing as tolerated    Mobility  Bed Mobility Overal bed mobility: Needs Assistance Bed Mobility: Supine to Sit;Sit to Supine     Supine to sit: Supervision Sit to supine: Supervision   General bed mobility comments: Utilized LLE to hook RLE to achieve into and out of the bed.  Transfers Overall transfer level: Needs assistance Equipment used: Rolling walker (2 wheeled) Transfers: Sit to/from Stand Sit to Stand: Supervision         General transfer comment: Min guard with cues for hand and foot placement.  Ambulation/Gait Ambulation/Gait assistance: Supervision Gait Distance (Feet): 100 Feet Assistive device: Rolling walker (2 wheeled) Gait Pattern/deviations: Step-to pattern;Antalgic;Decreased stance time - right;Trunk flexed     General Gait Details: Cues for upper trunk control and sequencing.  Pt antalgic with cues to correct to promote gt symmetry.  Continues with step to pattern due to pain.   Stairs Stairs: Yes Stairs assistance:  Min assist Stair Management: No rails;Backwards;With walker Number of Stairs: 2 General stair comments: Cues for sequencing and RW placement.  Backward to ascend and forwards to descend.   Wheelchair Mobility    Modified Rankin (Stroke Patients Only)       Balance Overall balance assessment: Needs assistance   Sitting balance-Leahy Scale: Good       Standing balance-Leahy Scale: Fair                              Cognition Arousal/Alertness: Awake/alert Behavior During Therapy: WFL for tasks assessed/performed Overall Cognitive Status: Within Functional Limits for tasks assessed                                        Exercises Total Joint Exercises Ankle Circles/Pumps: AROM;Both;20 reps;Supine Quad Sets: AROM;Right;10 reps;Supine Heel Slides: AAROM;Right;10 reps;Supine Hip ABduction/ADduction: AAROM;Right;10 reps;Supine Straight Leg Raises: AAROM;Right;10 reps;Supine Goniometric ROM: 5-73 degrees R knee ROM.    General Comments        Pertinent Vitals/Pain Pain Assessment: 0-10 Faces Pain Scale: Hurts whole lot Pain Location: R knee  Pain Descriptors / Indicators: Guarding;Grimacing;Operative site guarding Pain Intervention(s): Repositioned;Monitored during session    Home Living                      Prior Function            PT Goals (current goals can now be found  in the care plan section) Acute Rehab PT Goals Patient Stated Goal: To decrease pain  Potential to Achieve Goals: Good Progress towards PT goals: Progressing toward goals    Frequency    7X/week      PT Plan Current plan remains appropriate    Co-evaluation              AM-PAC PT "6 Clicks" Mobility   Outcome Measure  Help needed turning from your back to your side while in a flat bed without using bedrails?: None Help needed moving from lying on your back to sitting on the side of a flat bed without using bedrails?: None Help needed  moving to and from a bed to a chair (including a wheelchair)?: A Little Help needed standing up from a chair using your arms (e.g., wheelchair or bedside chair)?: A Little Help needed to walk in hospital room?: A Little Help needed climbing 3-5 steps with a railing? : A Little 6 Click Score: 20    End of Session Equipment Utilized During Treatment: Gait belt Activity Tolerance: Patient limited by pain Patient left: in bed;with call bell/phone within reach;with bed alarm set Nurse Communication: Mobility status PT Visit Diagnosis: Pain;Other abnormalities of gait and mobility (R26.89);Difficulty in walking, not elsewhere classified (R26.2) Pain - Right/Left: Right Pain - part of body: Knee     Time: 5003-7048 PT Time Calculation (min) (ACUTE ONLY): 31 min  Charges:  $Gait Training: 8-22 mins $Therapeutic Exercise: 8-22 mins                     Bonney Leitz , PTA Acute Rehabilitation Services Pager [phone] Office [phone]     Ramiro Pangilinan Artis Delay 08/06/2019, 10:37 AM

## 2019-08-06 NOTE — TOC Transition Note (Addendum)
Transition of Care [facility]) - CM/SW Discharge Note   Patient Details  Name: Brett [name] MRN: [id number] Date of Birth: 05/25/1960  Transition of Care [facility]) CM/SW Contact:  Epifanio Lesches, RN Phone Number: [phone] 08/06/2019, 1:38 PM   Clinical Narrative:    -S/P r tka 8/3/ Pt will transition to home today. Wife will provide transportation to home. Pt without medication concerns or affordability. States already have rolling walker and BSC. CPM machine will be delivered to pt's home per [employer].  RNCM will sign off for now as intervention is no longer needed. Please consult [place] again if new needs arise.     Final next level of care: Home w Home Health Services Barriers to Discharge: No Barriers Identified   Patient Goals and CMS Choice     Choice offered to / list presented to : Patient  Discharge Placement                       Discharge Plan and Services     Post Acute Care Choice: Durable Medical Equipment, Home Health          DME Arranged: CPM         HH Arranged: PT HH Agency: [facility]) Date HH Agency Contacted: 08/05/19 Time HH Agency Contacted: 1330 Representative spoke with at [facility] Agency: Tiffany  Social Determinants of Health (SDOH) Interventions     Readmission Risk Interventions No flowsheet data found.

## 2019-08-07 ENCOUNTER — Telehealth: Payer: Self-pay

## 2019-08-07 NOTE — Telephone Encounter (Signed)
Can you send this in or tell me what instructions/quantity.

## 2019-08-07 NOTE — Telephone Encounter (Signed)
Switch it to phenergan.  Thanks.

## 2019-08-07 NOTE — Telephone Encounter (Signed)
Dorene Sorrow called. States patient is taking Celexa & Zofran and on their system it show a Warning Level 2 contraindication messes with QT Waves (heart rhythm). Would like to know what we should do. He would like for [place] to call patient back to advise.

## 2019-08-08 ENCOUNTER — Other Ambulatory Visit: Payer: Self-pay | Admitting: Physician Assistant

## 2019-08-08 ENCOUNTER — Other Ambulatory Visit: Payer: Self-pay

## 2019-08-08 MED ORDER — PROMETHAZINE HCL 12.5 MG PO TABS
12.5000 mg | ORAL_TABLET | Freq: Four times a day (QID) | ORAL | 0 refills | Status: DC | PRN
Start: 2019-08-08 — End: 2022-07-18

## 2019-08-08 NOTE — Telephone Encounter (Signed)
I just sent in

## 2019-08-11 ENCOUNTER — Telehealth: Payer: Self-pay | Admitting: Physician Assistant

## 2019-08-11 DIAGNOSIS — R7309 Other abnormal glucose: Secondary | ICD-10-CM

## 2019-08-11 MED ORDER — FREESTYLE LIBRE 14 DAY SENSOR MISC: 1.0000 "application " | 4 refills | Status: DC

## 2019-08-11 MED ORDER — FREESTYLE LIBRE 14 DAY READER DEVI: 1.0000 | 4 refills | Status: DC

## 2019-08-11 NOTE — Telephone Encounter (Signed)
-----   Message from Gregery Na, CMA sent at 08/11/2019 11:34 AM EDT ----- Regarding: front office call Contact: [phone] Patient has requested a FREESTYLE LIBRE, I did not see on his chart that he is on insulin but patient states his insurance will cover. IF this is something you want him to have please send to pharmacy.  [facility]: [address]

## 2019-08-15 ENCOUNTER — Other Ambulatory Visit: Payer: Self-pay | Admitting: Physician Assistant

## 2019-08-15 ENCOUNTER — Other Ambulatory Visit: Payer: Self-pay | Admitting: Orthopaedic Surgery

## 2019-08-15 MED ORDER — GLUCOSE BLOOD VI STRP: ORAL_STRIP | 3 refills | Status: DC

## 2019-08-15 NOTE — Addendum Note (Signed)
Addended by: Quentin Mulling R on: 08/15/2019 08:45 AM   Modules accepted: Orders

## 2019-08-18 NOTE — Telephone Encounter (Signed)
Yes don't need to refill.  Thanks.

## 2019-08-18 NOTE — Telephone Encounter (Signed)
Was this for prophylaxis post-op?

## 2019-08-19 ENCOUNTER — Encounter: Payer: Self-pay | Admitting: Orthopaedic Surgery

## 2019-08-19 ENCOUNTER — Ambulatory Visit (INDEPENDENT_AMBULATORY_CARE_PROVIDER_SITE_OTHER): Payer: BC Managed Care – PPO | Admitting: Physician Assistant

## 2019-08-19 VITALS — Ht 66.0 in | Wt 173.0 lb

## 2019-08-19 DIAGNOSIS — Z96651 Presence of right artificial knee joint: Secondary | ICD-10-CM

## 2019-08-19 MED ORDER — METHOCARBAMOL 750 MG PO TABS
750.0000 mg | ORAL_TABLET | Freq: Three times a day (TID) | ORAL | 0 refills | Status: DC | PRN
Start: 2019-08-19 — End: 2019-09-16

## 2019-08-19 MED ORDER — OXYCODONE-ACETAMINOPHEN 5-325 MG PO TABS: 1.0000 | ORAL_TABLET | Freq: Four times a day (QID) | ORAL | 0 refills | Status: DC | PRN

## 2019-08-19 NOTE — Telephone Encounter (Signed)
Ok thanks 

## 2019-08-19 NOTE — Progress Notes (Signed)
Post-Op Visit Note   Patient: Brett [name]           Date of Birth: Mar 13, 1960           MRN: [id number] Visit Date: 08/19/2019 PCP: Lucky Cowboy, MD   Assessment & Plan:  Chief Complaint:  Chief Complaint  Patient presents with  . Right Knee - Routine Post Op    Right total knee arthroplasty 08/04/2019   Visit Diagnoses:  1. Hx of total knee replacement, right     Plan: Patient is a pleasant 59 year old gentleman presents to our clinic today 2 weeks out right total knee replacement 8-21.  He has been in quite a bit of pain since surgery.  He has been taking Percocet and Robaxin.  He has been getting physical therapy and he is ambulating with a walker.  Examination of his right knee reveals a well-healed surgical incision without complication.  Calf is soft nontender.  He is neurovascular intact distally.  Today, Steri-Strips were applied.  I refilled his Percocet and Robaxin.  Have also sent in an internal referral for PT.  He will follow-up with [place] in 4 weeks time for repeat evaluation and 2 view x-rays.  Follow-Up Instructions: Return in about 4 weeks (around 09/16/2019).   Orders:  No orders of the defined types were placed in this encounter.  No orders of the defined types were placed in this encounter.   Imaging: No new imaging.  PMFS History: Patient Active Problem List   Diagnosis Date Noted  . Status post total knee replacement, right 08/04/2019  . Status post total left knee replacement 01/24/2019  . Primary osteoarthritis of right knee 12/17/2018  . Overweight (BMI 25.0-29.9) 08/08/2017  . Medication management 03/23/2014  . Vitamin D deficiency 03/23/2014  . Abnormal glucose 03/23/2014  . Hyperlipemia 10/31/2006  . GERD 10/31/2006   Past Medical History:  Diagnosis Date  . Allergy    albuterol as needed  . Anal fissure   . Arthritis   . Colon polyps 01/13/11   Colonoscopy  . GERD (gastroesophageal reflux disease)   . Pneumonia   .  Pre-diabetes     Family History  Problem Relation Age of Onset  . Rheum arthritis Mother   . Heart disease Mother   . Diabetes Mother   . Arthritis Mother   . Diabetes Father   . Blindness Father        from DM?  Marland Kitchen Allergies Daughter   . Allergies Sister   . Asthma Daughter     Past Surgical History:  Procedure Laterality Date  . ANAL FISSURE REPAIR  02/24/11  . CHONDROPLASTY  08/14/2018   Procedure: CHONDROPLASTY;  Surgeon: Tarry Kos, MD;  Location: [facility];  Service: Orthopedics;;  . COLONOSCOPY    . KNEE ARTHROSCOPY WITH MEDIAL MENISECTOMY Left 08/14/2018   Procedure: LEFT KNEE ARTHROSCOPY WITH PARTIAL MEDIAL MENISCECTOMY;  Surgeon: Tarry Kos, MD;  Location: [facility];  Service: Orthopedics;  Laterality: Left;  . KNEE ARTHROSCOPY WITH MEDIAL MENISECTOMY Right 10/18/2018   Procedure: RIGHT KNEE ARTHROSCOPY WITH PARTIAL MEDIAL MENISCECTOMY;  Surgeon: Tarry Kos, MD;  Location: [facility];  Service: Orthopedics;  Laterality: Right;  . TOTAL KNEE ARTHROPLASTY Left 01/24/2019   Procedure: LEFT TOTAL KNEE ARTHROPLASTY;  Surgeon: Tarry Kos, MD;  Location: [facility];  Service: Orthopedics;  Laterality: Left;  . TOTAL KNEE ARTHROPLASTY Right 08/04/2019   Procedure: RIGHT TOTAL KNEE ARTHROPLASTY;  Surgeon: Roda Shutters,  Edwin Cap, MD;  Location: [facility];  Service: Orthopedics;  Laterality: Right;   Social History   Occupational History    Employer: [employer]  Tobacco Use  . Smoking status: Former Smoker    Packs/day: 0.25    Years: 27.00    Pack years: 6.75    Types: Cigarettes    Quit date: 08/20/2018    Years since quitting: 0.9  . Smokeless tobacco: Never Used  Vaping Use  . Vaping Use: Never used  Substance and Sexual Activity  . Alcohol use: Yes    Comment: socially  . Drug use: No  . Sexual activity: Not on file

## 2019-08-21 ENCOUNTER — Telehealth: Payer: Self-pay | Admitting: Physician Assistant

## 2019-08-21 ENCOUNTER — Telehealth: Payer: Self-pay | Admitting: Orthopaedic Surgery

## 2019-08-21 NOTE — Telephone Encounter (Signed)
Make sure he is also taking robaxin.  Can add 800mg  ibuprofen tid if needed as well

## 2019-08-21 NOTE — Telephone Encounter (Signed)
LIBRA  INSULIN GLUCOMETER- VERY PAINFUL ON LEFT ARM, BACK OF BISCEP. WHOLE ARM AND SHOULDER. PLEASE ADVISE

## 2019-08-21 NOTE — Telephone Encounter (Signed)
Message forwarded to patient

## 2019-08-21 NOTE — Telephone Encounter (Signed)
Patient aware.

## 2019-08-21 NOTE — Telephone Encounter (Signed)
See message below °

## 2019-08-21 NOTE — Telephone Encounter (Signed)
Pt called stating he recently had surgery abd he's been taking oxycodone for 2 days and still has ben unable to sleep; pt would like to know if there's anything that called be called in? Pt would like a CB  [phone]

## 2019-08-21 NOTE — Telephone Encounter (Signed)
Patient calling stating libre continuous meter is painful. Please remove from arm. Do not use.  Can use tyelnol 500mg  2 pills up to 3 x a day, can do ice or heat If any swelling, warmth, signs of infection come into the office for a visit.   Since it is your left arm, if you have any shortness of breath, chest pain, sweating, or dizziness with it go to the ER for an EKg and evaluate your heart.

## 2019-08-24 ENCOUNTER — Encounter: Payer: Self-pay | Admitting: Physical Medicine and Rehabilitation

## 2019-08-25 ENCOUNTER — Other Ambulatory Visit: Payer: Self-pay

## 2019-08-25 ENCOUNTER — Encounter: Payer: BC Managed Care – PPO | Admitting: Physician Assistant

## 2019-08-25 ENCOUNTER — Ambulatory Visit (INDEPENDENT_AMBULATORY_CARE_PROVIDER_SITE_OTHER): Payer: BC Managed Care – PPO | Admitting: Rehabilitative and Restorative Service Providers"

## 2019-08-25 ENCOUNTER — Encounter: Payer: Self-pay | Admitting: Rehabilitative and Restorative Service Providers"

## 2019-08-25 DIAGNOSIS — G8929 Other chronic pain: Secondary | ICD-10-CM

## 2019-08-25 DIAGNOSIS — M25561 Pain in right knee: Secondary | ICD-10-CM

## 2019-08-25 DIAGNOSIS — R600 Localized edema: Secondary | ICD-10-CM

## 2019-08-25 DIAGNOSIS — M25661 Stiffness of right knee, not elsewhere classified: Secondary | ICD-10-CM

## 2019-08-25 DIAGNOSIS — R262 Difficulty in walking, not elsewhere classified: Secondary | ICD-10-CM | POA: Diagnosis not present

## 2019-08-25 DIAGNOSIS — M6281 Muscle weakness (generalized): Secondary | ICD-10-CM

## 2019-08-25 DIAGNOSIS — R7309 Other abnormal glucose: Secondary | ICD-10-CM

## 2019-08-25 MED ORDER — GLUCOSE BLOOD VI STRP: ORAL_STRIP | 3 refills | Status: DC

## 2019-08-25 NOTE — Therapy (Signed)
[facility] [address] [place], [place], [address] Phone: [phone]   Fax:  [phone]  Physical Therapy Evaluation  Patient Details  Name: Brett [name] MRN: [id number] Date of Birth: 1960/09/24 Referring Provider (PT): Jari Sportsman PA-C   Encounter Date: 08/25/2019   PT End of Session - 08/25/19 1325    Visit Number 1    Number of Visits 16    Date for PT Re-Evaluation 10/20/19    Progress Note Due on Visit 10    PT Start Time 1318    PT Stop Time 1350    PT Time Calculation (min) 32 min    Activity Tolerance Patient tolerated treatment well    Behavior During Therapy [facility] for tasks assessed/performed           Past Medical History:  Diagnosis Date  . Allergy    albuterol as needed  . Anal fissure   . Arthritis   . Colon polyps 01/13/11   Colonoscopy  . GERD (gastroesophageal reflux disease)   . Pneumonia   . Pre-diabetes     Past Surgical History:  Procedure Laterality Date  . ANAL FISSURE REPAIR  02/24/11  . CHONDROPLASTY  08/14/2018   Procedure: CHONDROPLASTY;  Surgeon: Tarry Kos, MD;  Location: [facility];  Service: Orthopedics;;  . COLONOSCOPY    . KNEE ARTHROSCOPY WITH MEDIAL MENISECTOMY Left 08/14/2018   Procedure: LEFT KNEE ARTHROSCOPY WITH PARTIAL MEDIAL MENISCECTOMY;  Surgeon: Tarry Kos, MD;  Location: [facility];  Service: Orthopedics;  Laterality: Left;  . KNEE ARTHROSCOPY WITH MEDIAL MENISECTOMY Right 10/18/2018   Procedure: RIGHT KNEE ARTHROSCOPY WITH PARTIAL MEDIAL MENISCECTOMY;  Surgeon: Tarry Kos, MD;  Location: [facility];  Service: Orthopedics;  Laterality: Right;  . TOTAL KNEE ARTHROPLASTY Left 01/24/2019   Procedure: LEFT TOTAL KNEE ARTHROPLASTY;  Surgeon: Tarry Kos, MD;  Location: [facility];  Service: Orthopedics;  Laterality: Left;  . TOTAL KNEE ARTHROPLASTY Right 08/04/2019   Procedure: RIGHT TOTAL KNEE ARTHROPLASTY;  Surgeon: Tarry Kos,  MD;  Location: [facility];  Service: Orthopedics;  Laterality: Right;    There were no vitals filed for this visit.    Subjective Assessment - 08/25/19 1322    Subjective Recent Rt TKA.  Had home health performed until last week.  Currently reported FWW use c occasional cane at home.  Pt. stated having pain.  Pt. stated continued high pain variable from 8-10/10 c medicine.  Pt. stated pain in back of knee and medial knee, some in thigh.    Limitations Walking;Standing;Sitting    Patient Stated Goals Reduce pain, improve to independent ambulation.    Currently in Pain? Yes    Pain Score 8     Pain Location Knee    Pain Orientation Right    Pain Descriptors / Indicators Aching;Spasm;Sharp;Tightness    Pain Type Surgical pain    Pain Onset More than a month ago    Pain Frequency Constant    Aggravating Factors  Walking, standing, movement, nighttime pain.    Pain Relieving Factors Medicine, icing    Effect of Pain on Daily Activities Limiting in standing/walking, independent mobility.              [facility] PT Assessment - 08/25/19 0001      Assessment   Medical Diagnosis Rt TKA    Referring Provider (PT) Jari Sportsman PA-C    Onset Date/Surgical Date 08/04/19    Hand Dominance Right  Prior Therapy Home health, Lt knee      Precautions   Precautions None      Balance Screen   Has the patient fallen in the past 6 months No      Home Environment   Living Environment Private residence    Living Arrangements Spouse/significant other    Type of Home House    Home Access Stairs to enter    Entrance Stairs-Number of Steps 2    Home Layout One level      Prior Function   Level of Independence Independent    Vocation Requirements Standing cooking, on medical leave    Leisure [place] work, fishing, play with grandkids      Cognition   Overall Cognitive Status Within Functional Limits for tasks assessed      Observation/Other Assessments-Edema    Edema Circumferential       Circumferential Edema   Circumferential - Right 15   knee jt line   Circumferential - Left  14.5      Functional Tests   Functional tests Sit to Stand;Single leg stance      Single Leg Stance   Comments unable on Rt.  Lt 5 seconds      Sit to Stand   Comments unable from 18 inch chair s UE assist      ROM / Strength   AROM / PROM / Strength Strength;PROM;AROM      AROM   AROM Assessment Site Knee    Right/Left Knee Left;Right    Right Knee Extension -15    Right Knee Flexion 83   measured supine   Left Knee Extension 0    Left Knee Flexion 124      PROM   Overall PROM Comments pain at end range flexion/extension Rt knee    PROM Assessment Site Knee    Right/Left Knee Left;Right    Right Knee Extension -9    Right Knee Flexion 85   measured supine   Left Knee Extension --    Left Knee Flexion --      Strength   Strength Assessment Site Hip;Knee;Ankle    Right/Left Hip Left;Right    Right Hip Flexion 4/5    Left Hip Flexion 5/5    Right/Left Knee Left;Right    Right Knee Flexion 3+/5    Right Knee Extension 4-/5    Left Knee Flexion 5/5    Left Knee Extension 5/5    Right/Left Ankle Left;Right    Right Ankle Dorsiflexion 5/5    Left Ankle Dorsiflexion 5/5      Ambulation/Gait   Gait Comments FWW c step to gait pattern c occasional step through pattern, maintained knee flexion in stance Rt LE                      Objective measurements completed on examination: See above findings.       [facility] Adult PT Treatment/Exercise - 08/25/19 0001      Self-Care   Self-Care Other Self-Care Comments   Cues for HEP, icing, avoiding pillow behind knee     Exercises   Exercises Knee/Hip      Knee/Hip Exercises: Stretches   Gastroc Stretch 30 seconds;3 reps   Runner stretch Rt LE posterior     Knee/Hip Exercises: Seated   Long Arc Quad Right   x 10 c contralateral alternating   Other Seated Knee/Hip Exercises slr x 10 Rt LE      Knee/Hip Exercises:  Supine   Other Supine Knee/Hip Exercises heel slide c strap 10 sec hold x 6    Other Supine Knee/Hip Exercises supine TKE 3 mins Rt LE      Manual Therapy   Manual therapy comments seated Rt knee flexion, IR, distraction c contralateral Lt knee ext                  PT Education - 08/25/19 1324    Education Details HEP, POC    Person(s) Educated Patient    Methods Explanation;Demonstration;Verbal cues;Handout    Comprehension Returned demonstration;Verbalized understanding            PT Short Term Goals - 08/25/19 1355      PT SHORT TERM GOAL #1   Title Patient will demonstrate independent use of home exercise program to maintain progress from in clinic treatments.    Time 2    Period Weeks    Status New    Target Date 09/08/19             PT Long Term Goals - 08/25/19 1353      PT LONG TERM GOAL #1   Title Patient will demonstrate/report pain at worst less than or equal to 2/10 to facilitate minimal limitation in daily activity secondary to pain symptoms.    Time 8    Period Weeks    Status New    Target Date 10/20/19      PT LONG TERM GOAL #2   Title Patient will demonstrate independent use of home exercise program to facilitate ability to maintain/progress functional gains from skilled physical therapy services.    Time 8    Period Weeks    Status New    Target Date 10/20/19      PT LONG TERM GOAL #3   Title Patient will demonstrate independent ambulation community distances > 300 ft to facilitate [facility].    Time 8    Period Weeks    Status New    Target Date 10/20/19      PT LONG TERM GOAL #4   Title Patient will demonstrate Rt knee AROM 0-110 degrees to facilitate ability to perform transfers, sitting, ambulation, stair navigation s restriction due to mobility.    Time 8    Period Weeks    Status New    Target Date 10/20/19      PT LONG TERM GOAL #5   Title Patient will demonstrate Rt LE MMT 5/5 throughout to  facilitate ability to perform usual standing, walking, stairs at PLOF s limitation due to symptoms.    Time 8    Period Weeks    Status New    Target Date 10/20/19      Additional Long Term Goals   Additional Long Term Goals Yes      PT LONG TERM GOAL #6   Title Pt. will demonstrate bilateral SLS > 15 seconds to facilitate stability in ambulation on even and uneven surfaces for safety in ambulation.    Time 8    Period Weeks    Status New    Target Date 10/20/19                  Plan - 08/25/19 1341    Clinical Impression Statement Patient is a 59 y.o. male who comes to clinic with complaints of Rt knee pain s/p Rt TKA with mobility, strength and movement coordination deficits that impair their ability to perform usual daily and recreational  functional activities without increase difficulty/symptoms at this time.  Patient to benefit from skilled PT services to address impairments and limitations to improve to previous level of function without restriction secondary to condition.    Personal Factors and Comorbidities Other   Lt TKA this year   Stability/Clinical Decision Making Stable/Uncomplicated    Clinical Decision Making Low    Rehab Potential --   fair to good   PT Frequency 2x / week    PT Duration 8 weeks    PT Treatment/Interventions ADLs/Self Care Home Management;Cryotherapy;Electrical Stimulation;Iontophoresis 4mg /ml Dexamethasone;Moist Heat;Balance training;Therapeutic exercise;Therapeutic activities;Functional mobility training;Stair training;Ultrasound;Gait training;Neuromuscular re-education;Patient/family education;Manual techniques;Vasopneumatic Device;Taping;Dry needling;Passive range of motion;Spinal Manipulations;Joint Manipulations    PT Next Visit Plan Manual for mobility/pain, strength, early balance to improve towards ambulation c cane    PT Home Exercise Plan [email or web address]    Consulted and Agree with Plan of Care Patient           Patient will benefit  from skilled therapeutic intervention in order to improve the following deficits and impairments:  Abnormal gait, Decreased endurance, Hypomobility, Increased edema, Decreased activity tolerance, Pain, Decreased strength, Decreased balance, Decreased mobility, Difficulty walking, Increased muscle spasms, Impaired perceived functional ability, Decreased range of motion, Decreased coordination, Impaired flexibility  Visit Diagnosis: Chronic pain of right knee  Muscle weakness (generalized)  Difficulty in walking, not elsewhere classified  Stiffness of right knee, not elsewhere classified  Localized edema     Problem List Patient Active Problem List   Diagnosis Date Noted  . Status post total knee replacement, right 08/04/2019  . Status post total left knee replacement 01/24/2019  . Primary osteoarthritis of right knee 12/17/2018  . Overweight (BMI 25.0-29.9) 08/08/2017  . Medication management 03/23/2014  . Vitamin D deficiency 03/23/2014  . Abnormal glucose 03/23/2014  . Hyperlipemia 10/31/2006  . GERD 10/31/2006    11/02/2006, PT, DPT, OCS, ATC 08/25/19  2:03 PM     [facility] [address] [place], [place], [place] Phone: [phone]   Fax:  [phone]  Name: Brett [name] MRN: Abbe Amsterdam Date of Birth: 05/10/1960

## 2019-08-25 NOTE — Patient Instructions (Signed)
Access Code: [email or web address] URL: [email or web address] Date: 08/25/2019 Prepared by: Chyrel Masson  Exercises Supine Heel Slide - 2 x daily - 7 x weekly - 3 sets - 10 reps Seated Long Arc Quad - 2 x daily - 7 x weekly - 10 reps - 3 sets - 2 hold Supine Knee Extension Mobilization with Weight - 4 x daily - 7 x weekly - 1 reps - 1 sets - up to 15 mins hold Seated Straight Leg Heel Taps - 2 x daily - 7 x weekly - 3 sets - 10 reps Gastroc Stretch on Wall - 2 x daily - 7 x weekly - 5 reps - 1 sets - 30 hold

## 2019-08-26 ENCOUNTER — Encounter: Payer: Self-pay | Admitting: Adult Health Nurse Practitioner

## 2019-08-26 ENCOUNTER — Ambulatory Visit (INDEPENDENT_AMBULATORY_CARE_PROVIDER_SITE_OTHER): Payer: BC Managed Care – PPO | Admitting: Adult Health Nurse Practitioner

## 2019-08-26 VITALS — BP 140/90 | HR 86 | Temp 97.5°F | Wt 166.0 lb

## 2019-08-26 DIAGNOSIS — M79602 Pain in left arm: Secondary | ICD-10-CM

## 2019-08-26 DIAGNOSIS — Z79899 Other long term (current) drug therapy: Secondary | ICD-10-CM

## 2019-08-26 DIAGNOSIS — M792 Neuralgia and neuritis, unspecified: Secondary | ICD-10-CM | POA: Diagnosis not present

## 2019-08-26 DIAGNOSIS — R7309 Other abnormal glucose: Secondary | ICD-10-CM

## 2019-08-26 DIAGNOSIS — Z96651 Presence of right artificial knee joint: Secondary | ICD-10-CM | POA: Diagnosis not present

## 2019-08-26 DIAGNOSIS — R739 Hyperglycemia, unspecified: Secondary | ICD-10-CM

## 2019-08-26 MED ORDER — FREESTYLE LIBRE 14 DAY READER DEVI: 1.0000 | 4 refills | Status: AC

## 2019-08-26 NOTE — Progress Notes (Signed)
Assessment and Plan:  Brett [name] was seen today for referral.  Diagnoses and all orders for this visit:  Radicular pain in left arm Discussed multiple etiologies -     Ambulatory referral to Orthopedics Already taking medications for conservative treatment Discussed continuing Ice, rotate heat Discussed OTC diclofenac gel Consider imaging if delay in evaluation?  Status post total right knee replacement Continue follow up w/ Brett [name], Dr [name] Continue out patient PT Continue compression stockings. Continue pain management regiment:  Take oxycodone, methocarbamol 78m, ibuprofen 8070mTID and gabapentin.  Elevated random blood glucose level -     Continuous Blood Gluc Receiver (FREESTYLE LIBRE 14 DAY READER) DEVI; 1 Device by Does not apply route every 14 (fourteen) days.  Medication management Continued?     Further disposition pending results of labs. Discussed med's effects and SE's.   Over 30 minutes of face to face interview, exam, counseling, chart review, and critical decision making was performed.   Future Appointments  Date Time Provider [name]8/27/2021 10:15 AM NeElsie [name], PT [facility] None  09/02/2019 10:15 AM WrScot [name], PT [facility] None  09/15/2019  1:45 PM WrGirtha [name] [facility] None  09/16/2019  2:00 PM XuLeandrew [name] [facility] None  09/17/2019  1:45 PM WrScot [name], PT [facility] None  09/22/2019  1:45 PM WrGirtha [name] [facility] None  09/24/2019  1:45 PM WrGirtha [name] [facility] None  09/29/2019  1:45 PM WrGirtha [name] [facility] None  10/01/2019  1:45 PM WrGirtha [name] [facility] None  10/13/2019  3:30 PM CoVicie MuttersPA-C [facility] None  07/12/2020  2:00 PM CoVicie MuttersPA-C [facility] None    ------------------------------------------------------------------------------------------------------------------   HPI [id number].o.male presents for evaluation of pain in his left arm that started one week ago after he started  using the [employer]. He is pointing to his tricept as the area he has the arm patch.  Since then he has had pain in his left arm that is continuous, sharp / pulling type pain that is causing decreased sensation and tingling.  He reports that he is currently taking methocarbamol 75065mID for his knee as well as ibuprofen 800m41mD and Gabapentin 600mg37m.  This has not made a difference in any of his pain. His pain is 8/10.  He reports he has had a similar pain in the past to his left hip with pain down his right leg.  Reports he had an injection at that time.  He denies weakness although he reports dropping items such as remote, cup and pencil at times.  He reports he was surprised when it happened.  He reports that he is also having spasms in his left elbow and pectoris at times.  He denies any chest pains, palpitations. Has been applying Ice to right cervical that helps some.  He had TKR 08/04/19 he has had one ortho OV follow up. He was doing in home PT and has completed that series.   He is starting outpatient therapy and going 6-8 weeks, three days a week.  He reports it is going well and he is also doing exercises at home.  He reports he is watching what he eats and has reduced carbohydrates.  He reports that he is drinking 5 bottles of water 16oz a day.  Past Medical History:  Diagnosis Date   Allergy    albuterol as needed   Anal fissure    Arthritis  Colon polyps 01/13/11   Colonoscopy   GERD (gastroesophageal reflux disease)    Pneumonia    Pre-diabetes      Allergies  Allergen Reactions   Benzodiazepines Hives   Chantix  [Varenicline] Hives   Cialis [Tadalafil]     Hives   Dicyclomine Hives   Meloxicam     Rash   Penicillins Hives    DID THE REACTION INVOLVE: Swelling of the face/tongue/throat, SOB, or low BP? Unknown Sudden or severe rash/hives, skin peeling, or the inside of the mouth or nose? Unknown Did it require medical treatment?  n  When did it last happen?Over 5 years ago. If all above answers are NO, may proceed with cephalosporin use.    Relafen [Nabumetone]     Hives    Current Outpatient Medications on File Prior to Visit  Medication Sig   aspirin EC 81 MG tablet Take 1 tablet (81 mg total) by mouth 2 (two) times daily.   blood glucose meter kit and supplies Test blood sugar twice daily or as directed by YOUR provider.   blood glucose meter kit and supplies Dispense based insurance preference. E11.22   Cholecalciferol (VITAMIN D-3) 125 MCG (5000 UT) TABS Take 5,000 Units by mouth daily.   citalopram (CELEXA) 20 MG tablet Take 1 tablet (20 mg total) by mouth daily.   docusate sodium (COLACE) 100 MG capsule Take 1 capsule (100 mg total) by mouth daily as needed.   gabapentin (NEURONTIN) 600 MG tablet Take 600 mg by mouth 2 (two) times daily as needed (pain).   glucose blood test strip Test blood sugar once daily   Melatonin 10 MG TABS Take 10 mg by mouth at bedtime as needed (sleep).   methocarbamol (ROBAXIN) 750 MG tablet Take 1 tablet (750 mg total) by mouth 3 (three) times daily as needed for muscle spasms.   oxyCODONE-acetaminophen (PERCOCET) 5-325 MG tablet Take 1-2 tablets by mouth every 6 (six) hours as needed for severe pain.   promethazine (PHENERGAN) 12.5 MG tablet Take 1 tablet (12.5 mg total) by mouth every 6 (six) hours as needed for nausea or vomiting.   Continuous Blood Gluc Receiver (FREESTYLE LIBRE 14 DAY READER) DEVI 1 Device by Does not apply route every 14 (fourteen) days. (Patient not taking: Reported on 08/26/2019)   Continuous Blood Gluc Sensor (FREESTYLE LIBRE 14 DAY SENSOR) MISC Apply 1 application topically every 14 (fourteen) days. (Patient not taking: Reported on 08/26/2019)   methocarbamol (ROBAXIN) 500 MG tablet Take 1 tablet (500 mg total) by mouth 2 (two) times daily as needed.   oxyCODONE (ROXICODONE) 5 MG immediate release tablet Take 1-2 tabs po every 6-8  hours prn pain (Patient not taking: Reported on 08/26/2019)   sulfamethoxazole-trimethoprim (BACTRIM DS) 800-160 MG tablet Take 1 tablet by mouth 2 (two) times daily.   No current facility-administered medications on file prior to visit.    ROS: all negative except above.   Physical Exam:  BP 140/90    Pulse 86    Temp (!) 97.5 F (36.4 C)    Wt 166 lb (75.3 kg)    SpO2 99%    BMI 26.79 kg/m   Blood pressure elevated today, likely relate to pain.  General Appearance: Well nourished, in no apparent distress. Eyes: PERRLA, EOMs, conjunctiva no swelling or erythema Sinuses: No Frontal/maxillary tenderness ENT/Mouth: Ext aud canals clear, TMs without erythema, bulging. No erythema, swelling, or exudate on post pharynx.  Tonsils not swollen or erythematous. Hearing normal.  Neck: Supple,  thyroid normal.  Respiratory: Respiratory effort normal, BS equal bilaterally without rales, rhonchi, wheezing or stridor.  Cardio: RRR with no MRGs. Brisk peripheral pulses without edema.  Abdomen: Soft, + BS.  Non tender, no guarding, rebound, hernias, masses. Lymphatics: Non tender without lymphadenopathy.  Musculoskeletal: Full ROM, 3/5 LUE,  5/5 strength on RUE, gait, antalgic related to weight bearing status and using four pt rolling walker. Right shoulder abduction sign, positive. Skin: Warm, dry without rashes, lesions, ecchymosis.  Neuro: Cranial nerves intact. Normal muscle tone, no cerebellar symptoms. Sensation impaired on RUE, C6 & C7. Psych: Awake and oriented X 3, normal affect, Insight and Judgment appropriate.     Garnet Sierras, NP 11:56 AM [facility]

## 2019-08-28 ENCOUNTER — Other Ambulatory Visit: Payer: Self-pay

## 2019-08-28 MED ORDER — BLOOD GLUCOSE METER KIT: PACK | 0 refills | Status: DC

## 2019-08-29 ENCOUNTER — Ambulatory Visit (INDEPENDENT_AMBULATORY_CARE_PROVIDER_SITE_OTHER): Payer: BC Managed Care – PPO | Admitting: Orthopaedic Surgery

## 2019-08-29 ENCOUNTER — Ambulatory Visit (INDEPENDENT_AMBULATORY_CARE_PROVIDER_SITE_OTHER): Payer: BC Managed Care – PPO | Admitting: Physical Therapy

## 2019-08-29 ENCOUNTER — Other Ambulatory Visit: Payer: Self-pay

## 2019-08-29 ENCOUNTER — Ambulatory Visit (INDEPENDENT_AMBULATORY_CARE_PROVIDER_SITE_OTHER): Payer: BC Managed Care – PPO

## 2019-08-29 DIAGNOSIS — M5412 Radiculopathy, cervical region: Secondary | ICD-10-CM | POA: Diagnosis not present

## 2019-08-29 DIAGNOSIS — R262 Difficulty in walking, not elsewhere classified: Secondary | ICD-10-CM

## 2019-08-29 DIAGNOSIS — M6281 Muscle weakness (generalized): Secondary | ICD-10-CM | POA: Diagnosis not present

## 2019-08-29 DIAGNOSIS — R600 Localized edema: Secondary | ICD-10-CM

## 2019-08-29 DIAGNOSIS — M25661 Stiffness of right knee, not elsewhere classified: Secondary | ICD-10-CM | POA: Diagnosis not present

## 2019-08-29 DIAGNOSIS — M25561 Pain in right knee: Secondary | ICD-10-CM

## 2019-08-29 DIAGNOSIS — G8929 Other chronic pain: Secondary | ICD-10-CM

## 2019-08-29 MED ORDER — PREDNISONE 5 MG (21) PO TBPK: ORAL_TABLET | ORAL | 0 refills | Status: DC

## 2019-08-29 NOTE — Progress Notes (Signed)
Office Visit Note   Patient: Brett [name]           Date of Birth: 03/02/1960           MRN: [id number] Visit Date: 08/29/2019              Requested by: Elder Negus, NP [address] [place],  [place] [address] PCP: Lucky Cowboy, MD   Assessment & Plan: Visit Diagnoses:  1. Radiculopathy of cervical spine     Plan: Impression is cervical spine radiculopathy left upper extremity.  I will start the patient on a steroid taper.  He is currently on a muscle relaxer following his knee replacement surgery which she will continue to take.  I will also incorporate modalities and stretching for the cervical spine into his current physical therapy regimen for his total knee replacement.  We will follow up with [place] as needed for his neck.  Follow-Up Instructions: Return for for regularly scheduled tkr f/u.   Orders:  Orders Placed This Encounter  Procedures  . XR Cervical Spine 2 or 3 views   Meds ordered this encounter  Medications  . predniSONE (STERAPRED UNI-PAK 21 TAB) 5 MG (21) TBPK tablet    Sig: Take as directed    Dispense:  21 tablet    Refill:  0      Procedures: No procedures performed   Clinical Data: No additional findings.   Subjective: Chief Complaint  Patient presents with  . Left Arm - Pain    HPI patient is a pleasant 59 year old gentleman who comes in today with left-sided neck, parascapular and arm pain.  This is been ongoing for the past 2 weeks.  No known injury, but he has been using his walker for the past several weeks following a total knee replacement.  The pain he has is worse when he is using his shoulder as well as when he is turning his neck from side to side.  He notes numbness and tingling to the index and small fingers.  Review of Systems as detailed in HPI.  All others reviewed and are negative.   Objective: Vital Signs: There were no vitals taken for this visit.  Physical Exam well-developed well-nourished  gentleman no acute distress.  Alert oriented x3.  Ortho Exam examination of the neck reveals no spinous tenderness.  Mild paraspinous tenderness on the left.  Moderate tenderness to the left-sided parascapular trigger points.  Increased pain with cervical spine flexion and rotation.  Shoulder exam she has full range of motion in all planes.  He can internally rotate to L5 but this actually does cause increased pain.  Minimally positive empty can.  Negative cross body adduction.  He is neurovascularly intact distally.  No focal weakness.  Specialty Comments:  No specialty comments available.  Imaging: XR Cervical Spine 2 or 3 views  Result Date: 08/29/2019 Straightening of the cervical spine.  Significant degenerative disc disease C3-4, C4-5, C5-6    PMFS History: Patient Active Problem List   Diagnosis Date Noted  . Status post total knee replacement, right 08/04/2019  . Status post total left knee replacement 01/24/2019  . Primary osteoarthritis of right knee 12/17/2018  . Overweight (BMI 25.0-29.9) 08/08/2017  . Medication management 03/23/2014  . Vitamin D deficiency 03/23/2014  . Abnormal glucose 03/23/2014  . Hyperlipemia 10/31/2006  . GERD 10/31/2006   Past Medical History:  Diagnosis Date  . Allergy    albuterol as needed  . Anal fissure   .  Arthritis   . Colon polyps 01/13/11   Colonoscopy  . GERD (gastroesophageal reflux disease)   . Pneumonia   . Pre-diabetes     Family History  Problem Relation Age of Onset  . Rheum arthritis Mother   . Heart disease Mother   . Diabetes Mother   . Arthritis Mother   . Diabetes Father   . Blindness Father        from DM?  Marland Kitchen Allergies Daughter   . Allergies Sister   . Asthma Daughter     Past Surgical History:  Procedure Laterality Date  . ANAL FISSURE REPAIR  02/24/11  . CHONDROPLASTY  08/14/2018   Procedure: CHONDROPLASTY;  Surgeon: Tarry Kos, MD;  Location: [facility];  Service: Orthopedics;;  .  COLONOSCOPY    . KNEE ARTHROSCOPY WITH MEDIAL MENISECTOMY Left 08/14/2018   Procedure: LEFT KNEE ARTHROSCOPY WITH PARTIAL MEDIAL MENISCECTOMY;  Surgeon: Tarry Kos, MD;  Location: [facility];  Service: Orthopedics;  Laterality: Left;  . KNEE ARTHROSCOPY WITH MEDIAL MENISECTOMY Right 10/18/2018   Procedure: RIGHT KNEE ARTHROSCOPY WITH PARTIAL MEDIAL MENISCECTOMY;  Surgeon: Tarry Kos, MD;  Location: [facility];  Service: Orthopedics;  Laterality: Right;  . TOTAL KNEE ARTHROPLASTY Left 01/24/2019   Procedure: LEFT TOTAL KNEE ARTHROPLASTY;  Surgeon: Tarry Kos, MD;  [facility];  Service: Orthopedics;  Laterality: Left;  . TOTAL KNEE ARTHROPLASTY Right 08/04/2019   Procedure: RIGHT TOTAL KNEE ARTHROPLASTY;  Surgeon: Tarry Kos, MD;  Location: [facility];  Service: Orthopedics;  Laterality: Right;   Social History   Occupational History    Employer: [employer]  Tobacco Use  . Smoking status: Former Smoker    Packs/day: 0.25    Years: 27.00    Pack years: 6.75    Types: Cigarettes    Quit date: 08/20/2018    Years since quitting: 1.0  . Smokeless tobacco: Never Used  Vaping Use  . Vaping Use: Never used  Substance and Sexual Activity  . Alcohol use: Yes    Comment: socially  . Drug use: No  . Sexual activity: Not on file

## 2019-08-29 NOTE — Therapy (Signed)
[facility] Physical Therapy [address] [place], [place], [address] Phone: [phone]   Fax:  [phone]  Physical Therapy Treatment  Patient Details  Name: Brett [name] MRN: [id number] Date of Birth: 1960/01/23 Referring Provider (PT): Jari Sportsman PA-C   Encounter Date: 08/29/2019   PT End of Session - 08/29/19 1136    Visit Number 2    Number of Visits 16    Date for PT Re-Evaluation 10/20/19    Progress Note Due on Visit 10    PT Start Time 1015    PT Stop Time 1103    PT Time Calculation (min) 48 min    Activity Tolerance Patient tolerated treatment well    Behavior During Therapy [facility] for tasks assessed/performed           Past Medical History:  Diagnosis Date  . Allergy    albuterol as needed  . Anal fissure   . Arthritis   . Colon polyps 01/13/11   Colonoscopy  . GERD (gastroesophageal reflux disease)   . Pneumonia   . Pre-diabetes     Past Surgical History:  Procedure Laterality Date  . ANAL FISSURE REPAIR  02/24/11  . CHONDROPLASTY  08/14/2018   Procedure: CHONDROPLASTY;  Surgeon: Tarry Kos, MD;  Location: [facility];  Service: Orthopedics;;  . COLONOSCOPY    . KNEE ARTHROSCOPY WITH MEDIAL MENISECTOMY Left 08/14/2018   Procedure: LEFT KNEE ARTHROSCOPY WITH PARTIAL MEDIAL MENISCECTOMY;  Surgeon: Tarry Kos, MD;  Location: [facility];  Service: Orthopedics;  Laterality: Left;  . KNEE ARTHROSCOPY WITH MEDIAL MENISECTOMY Right 10/18/2018   Procedure: RIGHT KNEE ARTHROSCOPY WITH PARTIAL MEDIAL MENISCECTOMY;  Surgeon: Tarry Kos, MD;  Location: [facility];  Service: Orthopedics;  Laterality: Right;  . TOTAL KNEE ARTHROPLASTY Left 01/24/2019   Procedure: LEFT TOTAL KNEE ARTHROPLASTY;  Surgeon: Tarry Kos, MD;  [facility];  Service: Orthopedics;  Laterality: Left;  . TOTAL KNEE ARTHROPLASTY Right 08/04/2019   Procedure: RIGHT TOTAL KNEE ARTHROPLASTY;  Surgeon: Tarry Kos,  MD;  [facility];  Service: Orthopedics;  Laterality: Right;    There were no vitals filed for this visit.   Subjective Assessment - 08/29/19 1100    Subjective Relays his knee is doing okay, about 6/10 pain today, also reports arm pain today and will see MD about this today    Limitations Walking;Standing;Sitting    Patient Stated Goals Reduce pain, improve to independent ambulation.    Pain Onset More than a month ago               [facility] Adult PT Treatment/Exercise - 08/29/19 0001      Knee/Hip Exercises: Stretches   Active Hamstring Stretch Right;3 reps;30 seconds    Active Hamstring Stretch Limitations seated    Knee: Self-Stretch Limitations seated tailgate heelslides with self OP 10 sec X 10 reps    Gastroc Stretch 30 seconds;3 reps    Gastroc Stretch Limitations slantboard      Knee/Hip Exercises: Seated   Long Arc Quad Right;3 sets;10 reps    Long Arc Quad Weight 2 lbs.    Sit to Sand 5 reps;with UE support   using RW, unable to perform without     Modalities   Modalities Vasopneumatic      Vasopneumatic   Number Minutes Vasopneumatic  10 minutes    Vasopnuematic Location  Knee    Vasopneumatic Pressure Medium    Vasopneumatic Temperature  34  Manual Therapy   Manual therapy comments supine for Rt knee PROM flexon and extension, manual H.S. stretching                    PT Short Term Goals - 08/25/19 1355      PT SHORT TERM GOAL #1   Title Patient will demonstrate independent use of home exercise program to maintain progress from in clinic treatments.    Time 2    Period Weeks    Status New    Target Date 09/08/19             PT Long Term Goals - 08/25/19 1353      PT LONG TERM GOAL #1   Title Patient will demonstrate/report pain at worst less than or equal to 2/10 to facilitate minimal limitation in daily activity secondary to pain symptoms.    Time 8    Period Weeks    Status New    Target Date 10/20/19      PT LONG TERM  GOAL #2   Title Patient will demonstrate independent use of home exercise program to facilitate ability to maintain/progress functional gains from skilled physical therapy services.    Time 8    Period Weeks    Status New    Target Date 10/20/19      PT LONG TERM GOAL #3   Title Patient will demonstrate independent ambulation community distances > 300 ft to facilitate community integration at [facility].    Time 8    Period Weeks    Status New    Target Date 10/20/19      PT LONG TERM GOAL #4   Title Patient will demonstrate Rt knee AROM 0-110 degrees to facilitate ability to perform transfers, sitting, ambulation, stair navigation s restriction due to mobility.    Time 8    Period Weeks    Status New    Target Date 10/20/19      PT LONG TERM GOAL #5   Title Patient will demonstrate Rt LE MMT 5/5 throughout to facilitate ability to perform usual standing, walking, stairs at PLOF s limitation due to symptoms.    Time 8    Period Weeks    Status New    Target Date 10/20/19      Additional Long Term Goals   Additional Long Term Goals Yes      PT LONG TERM GOAL #6   Title Pt. will demonstrate bilateral SLS > 15 seconds to facilitate stability in ambulation on even and uneven surfaces for safety in ambulation.    Time 8    Period Weeks    Status New    Target Date 10/20/19                 Plan - 08/29/19 1138    Clinical Impression Statement Session focused on overall Rt knee ROM and gentle strengthening as tolerated. He was treated with manual therapy to further improve ROM and vasopnuematic to reduce overall pain and edema in his Rt knee. Continue POC    Personal Factors and Comorbidities Other    PT Frequency 2x / week    PT Duration 8 weeks    PT Treatment/Interventions ADLs/Self Care Home Management;Cryotherapy;Electrical Stimulation;Iontophoresis 4mg /ml Dexamethasone;Moist Heat;Balance training;Therapeutic exercise;Therapeutic activities;Functional mobility  training;Stair training;Ultrasound;Gait training;Neuromuscular re-education;Patient/family education;Manual techniques;Vasopneumatic Device;Taping;Dry needling;Passive range of motion;Spinal Manipulations;Joint Manipulations    PT Next Visit Plan Manual for mobility/pain, strength, early balance to improve towards ambulation c cane  PT Home Exercise Plan [id number]    Consulted and Agree with Plan of Care Patient           Patient will benefit from skilled therapeutic intervention in order to improve the following deficits and impairments:  Abnormal gait, Decreased endurance, Hypomobility, Increased edema, Decreased activity tolerance, Pain, Decreased strength, Decreased balance, Decreased mobility, Difficulty walking, Increased muscle spasms, Impaired perceived functional ability, Decreased range of motion, Decreased coordination, Impaired flexibility  Visit Diagnosis: Chronic pain of right knee  Muscle weakness (generalized)  Difficulty in walking, not elsewhere classified  Stiffness of right knee, not elsewhere classified  Localized edema     Problem List Patient Active Problem List   Diagnosis Date Noted  . Status post total knee replacement, right 08/04/2019  . Status post total left knee replacement 01/24/2019  . Primary osteoarthritis of right knee 12/17/2018  . Overweight (BMI 25.0-29.9) 08/08/2017  . Medication management 03/23/2014  . Vitamin D deficiency 03/23/2014  . Abnormal glucose 03/23/2014  . Hyperlipemia 10/31/2006  . GERD 10/31/2006    Brett [name] 08/29/2019, 11:43 AM  [facility] Physical Therapy [address] [place], [place], [address] Phone: [phone]   Fax:  [phone]  Name: Mikey Maffett MRN: [id number] Date of Birth: 03-13-1960

## 2019-09-02 ENCOUNTER — Encounter: Payer: Self-pay | Admitting: Rehabilitative and Restorative Service Providers"

## 2019-09-02 ENCOUNTER — Telehealth: Payer: Self-pay | Admitting: Orthopaedic Surgery

## 2019-09-02 ENCOUNTER — Ambulatory Visit (INDEPENDENT_AMBULATORY_CARE_PROVIDER_SITE_OTHER): Payer: BC Managed Care – PPO | Admitting: Rehabilitative and Restorative Service Providers"

## 2019-09-02 ENCOUNTER — Other Ambulatory Visit: Payer: Self-pay

## 2019-09-02 DIAGNOSIS — M25661 Stiffness of right knee, not elsewhere classified: Secondary | ICD-10-CM | POA: Diagnosis not present

## 2019-09-02 DIAGNOSIS — M6281 Muscle weakness (generalized): Secondary | ICD-10-CM | POA: Diagnosis not present

## 2019-09-02 DIAGNOSIS — R262 Difficulty in walking, not elsewhere classified: Secondary | ICD-10-CM

## 2019-09-02 DIAGNOSIS — R600 Localized edema: Secondary | ICD-10-CM

## 2019-09-02 DIAGNOSIS — Z96652 Presence of left artificial knee joint: Secondary | ICD-10-CM

## 2019-09-02 DIAGNOSIS — G8929 Other chronic pain: Secondary | ICD-10-CM

## 2019-09-02 DIAGNOSIS — M25561 Pain in right knee: Secondary | ICD-10-CM | POA: Diagnosis not present

## 2019-09-02 NOTE — Telephone Encounter (Signed)
Okay to put order in.  

## 2019-09-02 NOTE — Telephone Encounter (Signed)
Patient states he need a new Rx for physical therapy sent upstairs.

## 2019-09-02 NOTE — Telephone Encounter (Signed)
Yes, thank you.

## 2019-09-02 NOTE — Therapy (Signed)
[facility] Physical Therapy [address] [place], [place], [address] Phone: [phone]   Fax:  [phone]  Physical Therapy Treatment  Patient Details  Name: Brett [name] MRN: [id number] Date of Birth: 1960-07-12 Referring Provider (PT): Jari Sportsman PA-C   Encounter Date: 09/02/2019   PT End of Session - 09/02/19 1144    Visit Number 3    Number of Visits 16    Date for PT Re-Evaluation 10/20/19    Progress Note Due on Visit 10    PT Start Time 1130    PT Stop Time 1215    PT Time Calculation (min) 45 min    Activity Tolerance Patient tolerated treatment well    Behavior During Therapy [facility] for tasks assessed/performed           Past Medical History:  Diagnosis Date  . Allergy    albuterol as needed  . Anal fissure   . Arthritis   . Colon polyps 01/13/11   Colonoscopy  . GERD (gastroesophageal reflux disease)   . Pneumonia   . Pre-diabetes     Past Surgical History:  Procedure Laterality Date  . ANAL FISSURE REPAIR  02/24/11  . CHONDROPLASTY  08/14/2018   Procedure: CHONDROPLASTY;  Surgeon: Tarry Kos, MD;  Location: [facility];  Service: Orthopedics;;  . COLONOSCOPY    . KNEE ARTHROSCOPY WITH MEDIAL MENISECTOMY Left 08/14/2018   Procedure: LEFT KNEE ARTHROSCOPY WITH PARTIAL MEDIAL MENISCECTOMY;  Surgeon: Tarry Kos, MD;  Location: [facility];  Service: Orthopedics;  Laterality: Left;  . KNEE ARTHROSCOPY WITH MEDIAL MENISECTOMY Right 10/18/2018   Procedure: RIGHT KNEE ARTHROSCOPY WITH PARTIAL MEDIAL MENISCECTOMY;  Surgeon: Tarry Kos, MD;  Location: [facility];  Service: Orthopedics;  Laterality: Right;  . TOTAL KNEE ARTHROPLASTY Left 01/24/2019   Procedure: LEFT TOTAL KNEE ARTHROPLASTY;  Surgeon: Tarry Kos, MD;  [facility];  Service: Orthopedics;  Laterality: Left;  . TOTAL KNEE ARTHROPLASTY Right 08/04/2019   Procedure: RIGHT TOTAL KNEE ARTHROPLASTY;  Surgeon: Tarry Kos,  MD;  [facility];  Service: Orthopedics;  Laterality: Right;    There were no vitals filed for this visit.   Subjective Assessment - 09/02/19 1139    Subjective Pt. indicated quick pain at times in front of Rt knee, severe noted.  Doesn't happen often.    Limitations Walking;Standing;Sitting    Patient Stated Goals Reduce pain, improve to independent ambulation.    Currently in Pain? Yes    Pain Score 6     Pain Location Knee    Pain Orientation Right    Pain Descriptors / Indicators Aching;Sharp;Tightness    Pain Type Surgical pain    Pain Onset More than a month ago    Pain Frequency Constant    Aggravating Factors  quick pains c standing at times, sitting prolonged painful /tight    Pain Relieving Factors medicine, ice    Effect of Pain on Daily Activities Limited in independent ambulation                             [facility] Adult PT Treatment/Exercise - 09/02/19 0001      Knee/Hip Exercises: Aerobic   Nustep Lvl 6 10 mins      Knee/Hip Exercises: Seated   Other Seated Knee/Hip Exercises slr 3 x 10 Rt LE, isometrics 5 sec hold x 15 in 60 deg, 30 deg Rt knee  Sit to Sand without UE support;10 reps   24 inch table     Vasopneumatic   Number Minutes Vasopneumatic  10 minutes    Vasopnuematic Location  Knee    Vasopneumatic Pressure Medium    Vasopneumatic Temperature  34      Manual Therapy   Manual therapy comments seated distraction, flexion, IR Rt knee                    PT Short Term Goals - 08/25/19 1355      PT SHORT TERM GOAL #1   Title Patient will demonstrate independent use of home exercise program to maintain progress from in clinic treatments.    Time 2    Period Weeks    Status New    Target Date 09/08/19             PT Long Term Goals - 08/25/19 1353      PT LONG TERM GOAL #1   Title Patient will demonstrate/report pain at worst less than or equal to 2/10 to facilitate minimal limitation in daily activity  secondary to pain symptoms.    Time 8    Period Weeks    Status New    Target Date 10/20/19      PT LONG TERM GOAL #2   Title Patient will demonstrate independent use of home exercise program to facilitate ability to maintain/progress functional gains from skilled physical therapy services.    Time 8    Period Weeks    Status New    Target Date 10/20/19      PT LONG TERM GOAL #3   Title Patient will demonstrate independent ambulation community distances > 300 ft to facilitate community integration at [facility].    Time 8    Period Weeks    Status New    Target Date 10/20/19      PT LONG TERM GOAL #4   Title Patient will demonstrate Rt knee AROM 0-110 degrees to facilitate ability to perform transfers, sitting, ambulation, stair navigation s restriction due to mobility.    Time 8    Period Weeks    Status New    Target Date 10/20/19      PT LONG TERM GOAL #5   Title Patient will demonstrate Rt LE MMT 5/5 throughout to facilitate ability to perform usual standing, walking, stairs at PLOF s limitation due to symptoms.    Time 8    Period Weeks    Status New    Target Date 10/20/19      Additional Long Term Goals   Additional Long Term Goals Yes      PT LONG TERM GOAL #6   Title Pt. will demonstrate bilateral SLS > 15 seconds to facilitate stability in ambulation on even and uneven surfaces for safety in ambulation.    Time 8    Period Weeks    Status New    Target Date 10/20/19                 Plan - 09/02/19 1156    Clinical Impression Statement Manual assessment of restriction for knee flexion on Rt presented as low but Pt. reported moderate to severe pain limiting movement of Rt knee.  Symptoms mostly noted on medial Rt knee.   Pt. to continue to benefit from skilled PT services to progress mobility, strength and movement coordination.    Personal Factors and Comorbidities Other    PT Frequency 2x / week  PT Duration 8 weeks    PT Treatment/Interventions  ADLs/Self Care Home Management;Cryotherapy;Electrical Stimulation;Iontophoresis 4mg /ml Dexamethasone;Moist Heat;Balance training;Therapeutic exercise;Therapeutic activities;Functional mobility training;Stair training;Ultrasound;Gait training;Neuromuscular re-education;Patient/family education;Manual techniques;Vasopneumatic Device;Taping;Dry needling;Passive range of motion;Spinal Manipulations;Joint Manipulations    PT Next Visit Plan Strengthening, mobility, balance as able    PT Home Exercise Plan [id number]    Consulted and Agree with Plan of Care Patient           Patient will benefit from skilled therapeutic intervention in order to improve the following deficits and impairments:  Abnormal gait, Decreased endurance, Hypomobility, Increased edema, Decreased activity tolerance, Pain, Decreased strength, Decreased balance, Decreased mobility, Difficulty walking, Increased muscle spasms, Impaired perceived functional ability, Decreased range of motion, Decreased coordination, Impaired flexibility  Visit Diagnosis: Chronic pain of right knee  Muscle weakness (generalized)  Difficulty in walking, not elsewhere classified  Stiffness of right knee, not elsewhere classified  Localized edema     Problem List Patient Active Problem List   Diagnosis Date Noted  . Status post total knee replacement, right 08/04/2019  . Status post total left knee replacement 01/24/2019  . Primary osteoarthritis of right knee 12/17/2018  . Overweight (BMI 25.0-29.9) 08/08/2017  . Medication management 03/23/2014  . Vitamin D deficiency 03/23/2014  . Abnormal glucose 03/23/2014  . Hyperlipemia 10/31/2006  . GERD 10/31/2006    11/02/2006, PT, DPT, OCS, ATC 09/02/19  12:11 PM    [facility] Physical Therapy [address] [place], [place], [place] Phone: [phone]   Fax:  [phone]  Name: Brett [name] MRN: Abbe Amsterdam Date of Birth: 04/04/1960

## 2019-09-02 NOTE — Telephone Encounter (Signed)
done

## 2019-09-03 ENCOUNTER — Encounter: Payer: Self-pay | Admitting: Physical Therapy

## 2019-09-03 ENCOUNTER — Ambulatory Visit (INDEPENDENT_AMBULATORY_CARE_PROVIDER_SITE_OTHER): Payer: BC Managed Care – PPO | Admitting: Physical Therapy

## 2019-09-03 ENCOUNTER — Telehealth: Payer: Self-pay | Admitting: Orthopaedic Surgery

## 2019-09-03 DIAGNOSIS — M25661 Stiffness of right knee, not elsewhere classified: Secondary | ICD-10-CM | POA: Diagnosis not present

## 2019-09-03 DIAGNOSIS — R262 Difficulty in walking, not elsewhere classified: Secondary | ICD-10-CM | POA: Diagnosis not present

## 2019-09-03 DIAGNOSIS — M25561 Pain in right knee: Secondary | ICD-10-CM | POA: Diagnosis not present

## 2019-09-03 DIAGNOSIS — M6281 Muscle weakness (generalized): Secondary | ICD-10-CM

## 2019-09-03 DIAGNOSIS — R600 Localized edema: Secondary | ICD-10-CM

## 2019-09-03 DIAGNOSIS — G8929 Other chronic pain: Secondary | ICD-10-CM

## 2019-09-03 NOTE — Telephone Encounter (Signed)
Patient called. He would like a refill on oxycodone. His call back number is [phone]

## 2019-09-03 NOTE — Therapy (Signed)
[facility] Physical Therapy [address] [place], [place], [address] Phone: [phone]   Fax:  [phone]  Physical Therapy Treatment  Patient Details  Name: Brett [name] MRN: [id number] Date of Birth: 02-Sep-1960 Referring Provider (PT): Jari Sportsman PA-C   Encounter Date: 09/03/2019   PT End of Session - 09/03/19 0926    Visit Number 4    Number of Visits 16    Date for PT Re-Evaluation 10/20/19    Progress Note Due on Visit 10    PT Start Time 0850    PT Stop Time 0935    PT Time Calculation (min) 45 min    Activity Tolerance Patient tolerated treatment well    Behavior During Therapy [facility] for tasks assessed/performed           Past Medical History:  Diagnosis Date  . Allergy    albuterol as needed  . Anal fissure   . Arthritis   . Colon polyps 01/13/11   Colonoscopy  . GERD (gastroesophageal reflux disease)   . Pneumonia   . Pre-diabetes     Past Surgical History:  Procedure Laterality Date  . ANAL FISSURE REPAIR  02/24/11  . CHONDROPLASTY  08/14/2018   Procedure: CHONDROPLASTY;  Surgeon: Tarry Kos, MD;  Location: [facility];  Service: Orthopedics;;  . COLONOSCOPY    . KNEE ARTHROSCOPY WITH MEDIAL MENISECTOMY Left 08/14/2018   Procedure: LEFT KNEE ARTHROSCOPY WITH PARTIAL MEDIAL MENISCECTOMY;  Surgeon: Tarry Kos, MD;  Location: [facility];  Service: Orthopedics;  Laterality: Left;  . KNEE ARTHROSCOPY WITH MEDIAL MENISECTOMY Right 10/18/2018   Procedure: RIGHT KNEE ARTHROSCOPY WITH PARTIAL MEDIAL MENISCECTOMY;  Surgeon: Tarry Kos, MD;  Location: [facility];  Service: Orthopedics;  Laterality: Right;  . TOTAL KNEE ARTHROPLASTY Left 01/24/2019   Procedure: LEFT TOTAL KNEE ARTHROPLASTY;  Surgeon: Tarry Kos, MD;  [facility];  Service: Orthopedics;  Laterality: Left;  . TOTAL KNEE ARTHROPLASTY Right 08/04/2019   Procedure: RIGHT TOTAL KNEE ARTHROPLASTY;  Surgeon: Tarry Kos, MD;   Location: [facility];  Service: Orthopedics;  Laterality: Right;    There were no vitals filed for this visit.   Subjective Assessment - 09/03/19 0853    Subjective knee is doing okay today.    Limitations Walking;Standing;Sitting    Patient Stated Goals Reduce pain, improve to independent ambulation.    Currently in Pain? Yes    Pain Score 6     Pain Location Knee    Pain Orientation Right    Pain Descriptors / Indicators Aching;Sharp;Tightness    Pain Type Surgical pain;Acute pain    Pain Onset More than a month ago    Pain Frequency Constant    Aggravating Factors  standing at times, prolonged sitting    Pain Relieving Factors meds, ice              [facility] PT Assessment - 09/03/19 0906      Assessment   Medical Diagnosis Rt TKA    Referring Provider (PT) Jari Sportsman PA-C    Onset Date/Surgical Date 08/04/19      AROM   Right Knee Extension 0    Right Knee Flexion 102                         [facility] Adult PT Treatment/Exercise - 09/03/19 0854      Knee/Hip Exercises: Aerobic   Nustep Lvl 5 10 mins  Knee/Hip Exercises: Seated   Long Arc Quad Right;3 sets;10 reps    Long Arc Quad Weight 3 lbs.    Sit to Sand without UE support   3x10; elevated mat table     Knee/Hip Exercises: Supine   Quad Sets Right;3 sets;10 reps    Quad Sets Limitations cues needed for quad activation    Heel Slides AAROM;Right;3 sets;10 reps      Vasopneumatic   Number Minutes Vasopneumatic  10 minutes    Vasopnuematic Location  Knee    Vasopneumatic Pressure Medium    Vasopneumatic Temperature  34                    PT Short Term Goals - 08/25/19 1355      PT SHORT TERM GOAL #1   Title Patient will demonstrate independent use of home exercise program to maintain progress from in clinic treatments.    Time 2    Period Weeks    Status New    Target Date 09/08/19             PT Long Term Goals - 08/25/19 1353      PT LONG TERM GOAL #1   Title  Patient will demonstrate/report pain at worst less than or equal to 2/10 to facilitate minimal limitation in daily activity secondary to pain symptoms.    Time 8    Period Weeks    Status New    Target Date 10/20/19      PT LONG TERM GOAL #2   Title Patient will demonstrate independent use of home exercise program to facilitate ability to maintain/progress functional gains from skilled physical therapy services.    Time 8    Period Weeks    Status New    Target Date 10/20/19      PT LONG TERM GOAL #3   Title Patient will demonstrate independent ambulation community distances > 300 ft to facilitate community integration at [facility].    Time 8    Period Weeks    Status New    Target Date 10/20/19      PT LONG TERM GOAL #4   Title Patient will demonstrate Rt knee AROM 0-110 degrees to facilitate ability to perform transfers, sitting, ambulation, stair navigation s restriction due to mobility.    Time 8    Period Weeks    Status New    Target Date 10/20/19      PT LONG TERM GOAL #5   Title Patient will demonstrate Rt LE MMT 5/5 throughout to facilitate ability to perform usual standing, walking, stairs at PLOF s limitation due to symptoms.    Time 8    Period Weeks    Status New    Target Date 10/20/19      Additional Long Term Goals   Additional Long Term Goals Yes      PT LONG TERM GOAL #6   Title Pt. will demonstrate bilateral SLS > 15 seconds to facilitate stability in ambulation on even and uneven surfaces for safety in ambulation.    Time 8    Period Weeks    Status New    Target Date 10/20/19                 Plan - 09/03/19 0926    Clinical Impression Statement Pt demonstrating significant improvement in Rt knee AROM today.  Overall still with significant weakness in RLE so needs continued strengthening and balance work.  Will continue  to benefit from PT to maximize function.    Personal Factors and Comorbidities Other    PT Frequency 2x / week    PT Duration 8  weeks    PT Treatment/Interventions ADLs/Self Care Home Management;Cryotherapy;Electrical Stimulation;Iontophoresis 4mg /ml Dexamethasone;Moist Heat;Balance training;Therapeutic exercise;Therapeutic activities;Functional mobility training;Stair training;Ultrasound;Gait training;Neuromuscular re-education;Patient/family education;Manual techniques;Vasopneumatic Device;Taping;Dry needling;Passive range of motion;Spinal Manipulations;Joint Manipulations    PT Next Visit Plan Strengthening, mobility, balance as able; check STG - now has referral for neck pain so assessment for that    PT Home Exercise Plan [email or web address]    Consulted and Agree with Plan of Care Patient           Patient will benefit from skilled therapeutic intervention in order to improve the following deficits and impairments:  Abnormal gait, Decreased endurance, Hypomobility, Increased edema, Decreased activity tolerance, Pain, Decreased strength, Decreased balance, Decreased mobility, Difficulty walking, Increased muscle spasms, Impaired perceived functional ability, Decreased range of motion, Decreased coordination, Impaired flexibility  Visit Diagnosis: Chronic pain of right knee  Muscle weakness (generalized)  Difficulty in walking, not elsewhere classified  Stiffness of right knee, not elsewhere classified  Localized edema     Problem List Patient Active Problem List   Diagnosis Date Noted  . Status post total knee replacement, right 08/04/2019  . Status post total left knee replacement 01/24/2019  . Primary osteoarthritis of right knee 12/17/2018  . Overweight (BMI 25.0-29.9) 08/08/2017  . Medication management 03/23/2014  . Vitamin D deficiency 03/23/2014  . Abnormal glucose 03/23/2014  . Hyperlipemia 10/31/2006  . GERD 10/31/2006        11/02/2006, PT, DPT 09/03/19 9:28 AM     [facility] Physical Therapy [address] [place], [place], [place] Phone: [phone]    Fax:  [phone]  Name: Zeyad Delaguila MRN: Abbe Amsterdam Date of Birth: 09/16/1960

## 2019-09-04 ENCOUNTER — Other Ambulatory Visit: Payer: Self-pay | Admitting: Physician Assistant

## 2019-09-04 MED ORDER — OXYCODONE-ACETAMINOPHEN 5-325 MG PO TABS
1.0000 | ORAL_TABLET | Freq: Two times a day (BID) | ORAL | 0 refills | Status: DC | PRN
Start: 2019-09-04 — End: 2020-07-14

## 2019-09-04 NOTE — Telephone Encounter (Signed)
Sent in

## 2019-09-04 NOTE — Telephone Encounter (Signed)
Patient aware.

## 2019-09-11 ENCOUNTER — Other Ambulatory Visit: Payer: Self-pay

## 2019-09-11 ENCOUNTER — Encounter: Payer: Self-pay | Admitting: Physical Therapy

## 2019-09-11 ENCOUNTER — Ambulatory Visit (INDEPENDENT_AMBULATORY_CARE_PROVIDER_SITE_OTHER): Payer: BC Managed Care – PPO | Admitting: Physical Therapy

## 2019-09-11 DIAGNOSIS — M25661 Stiffness of right knee, not elsewhere classified: Secondary | ICD-10-CM | POA: Diagnosis not present

## 2019-09-11 DIAGNOSIS — G8929 Other chronic pain: Secondary | ICD-10-CM

## 2019-09-11 DIAGNOSIS — M25561 Pain in right knee: Secondary | ICD-10-CM

## 2019-09-11 DIAGNOSIS — R262 Difficulty in walking, not elsewhere classified: Secondary | ICD-10-CM | POA: Diagnosis not present

## 2019-09-11 DIAGNOSIS — M5412 Radiculopathy, cervical region: Secondary | ICD-10-CM

## 2019-09-11 DIAGNOSIS — M6281 Muscle weakness (generalized): Secondary | ICD-10-CM

## 2019-09-11 DIAGNOSIS — R600 Localized edema: Secondary | ICD-10-CM

## 2019-09-11 NOTE — Therapy (Signed)
[facility] Physical Therapy [address] [place], [place], [address] Phone: [phone]   Fax:  [phone]  Physical Therapy Treatment/Recertification  Patient Details  Name: Brett [name] MRN: [id number] Date of Birth: Feb 23, 1960 Referring Provider (PT): Jari Sportsman PA-C   Encounter Date: 09/11/2019   PT End of Session - 09/11/19 1335    Visit Number 5    Number of Visits 16    Date for PT Re-Evaluation 10/20/19    Progress Note Due on Visit 10    PT Start Time 1252    PT Stop Time 1335    PT Time Calculation (min) 43 min    Activity Tolerance Patient tolerated treatment well    Behavior During Therapy [facility] for tasks assessed/performed           Past Medical History:  Diagnosis Date  . Allergy    albuterol as needed  . Anal fissure   . Arthritis   . Colon polyps 01/13/11   Colonoscopy  . GERD (gastroesophageal reflux disease)   . Pneumonia   . Pre-diabetes     Past Surgical History:  Procedure Laterality Date  . ANAL FISSURE REPAIR  02/24/11  . CHONDROPLASTY  08/14/2018   Procedure: CHONDROPLASTY;  Surgeon: Tarry Kos, MD;  Location: [facility];  Service: Orthopedics;;  . COLONOSCOPY    . KNEE ARTHROSCOPY WITH MEDIAL MENISECTOMY Left 08/14/2018   Procedure: LEFT KNEE ARTHROSCOPY WITH PARTIAL MEDIAL MENISCECTOMY;  Surgeon: Tarry Kos, MD;  Location: [facility];  Service: Orthopedics;  Laterality: Left;  . KNEE ARTHROSCOPY WITH MEDIAL MENISECTOMY Right 10/18/2018   Procedure: RIGHT KNEE ARTHROSCOPY WITH PARTIAL MEDIAL MENISCECTOMY;  Surgeon: Tarry Kos, MD;  [facility];  Service: Orthopedics;  Laterality: Right;  . TOTAL KNEE ARTHROPLASTY Left 01/24/2019   Procedure: LEFT TOTAL KNEE ARTHROPLASTY;  Surgeon: Tarry Kos, MD;  [facility];  Service: Orthopedics;  Laterality: Left;  . TOTAL KNEE ARTHROPLASTY Right 08/04/2019   Procedure: RIGHT TOTAL KNEE ARTHROPLASTY;  Surgeon:  Tarry Kos, MD;  [facility];  Service: Orthopedics;  Laterality: Right;    There were no vitals filed for this visit.   Subjective Assessment - 09/11/19 1250    Subjective "hurting all over today." has some pain in the Lt side of his neck/shoulder - reaching causes pain, shooting down to fingers    Limitations Walking;Standing;Sitting    Patient Stated Goals Reduce pain, improve to independent ambulation.    Currently in Pain? Yes    Pain Score 7     Pain Location Knee    Pain Orientation Right    Pain Descriptors / Indicators Aching;Sharp;Tightness    Pain Type Acute pain;Surgical pain    Pain Onset More than a month ago    Pain Frequency Constant    Aggravating Factors  standing, bending    Pain Relieving Factors medication, ice    Multiple Pain Sites Yes    Pain Score 9    Pain Location Neck    Pain Orientation Left    Pain Descriptors / Indicators Shooting;Aching;Sharp    Pain Type Acute pain    Pain Onset 1 to 4 weeks ago    Pain Frequency Constant    Aggravating Factors  coughing, pressing down or pushing up on something,  using LUE    Pain Relieving Factors ice    Effect of Pain on Daily Activities unable to sleep on Lt side  [facility] - 09/11/19 1305      Assessment   Medical Diagnosis Rt TKA, neck pain    Onset Date/Surgical Date 08/04/19    Hand Dominance Right      AROM   AROM Assessment Site Cervical    Cervical Flexion 22   with pain   Cervical Extension 30    Cervical - Right Side Bend 20   with Lt pain   Cervical - Left Side Bend 33   with pain   Cervical - Right Rotation 53    Cervical - Left Rotation 55      Strength   Strength Assessment Site Shoulder;Hand    Right/Left Shoulder Right;Left    Right Shoulder Flexion 4+/5    Right Shoulder ABduction 4+/5    Right Shoulder Internal Rotation 5/5    Right Shoulder External Rotation 5/5    Left Shoulder Flexion 3+/5    Left Shoulder ABduction 3+/5    Left Shoulder  Internal Rotation 5/5    Left Shoulder External Rotation 4/5    Right/Left hand Right;Left    Right Hand Grip (lbs) 36.7   34.5, 42.5, 33   Left Hand Grip (lbs) 35.2   32.9, 36.3, 36.4     Palpation   Palpation comment trigger points noted in Lt upper trap, levator scapula, cervical paraspinals      Special Tests    Special Tests Cervical    Cervical Tests Spurling's;Dictraction      Spurling's   Findings Negative      Distraction Test   Findngs Negative                         [facility] Adult PT Treatment/Exercise - 09/11/19 1257      Exercises   Exercises Neck      Neck Exercises: Seated   Shoulder Rolls 20 reps;Backwards    Other Seated Exercise scap retraction x 10 reps       Knee/Hip Exercises: Aerobic   Recumbent Bike L2 x 8 min      Neck Exercises: Stretches   Upper Trapezius Stretch Left;2 reps;30 seconds    Levator Stretch Left;2 reps;30 seconds                  PT Education - 09/11/19 1335    Education Details cervical HEP    Person(s) Educated Patient    Methods Explanation;Demonstration;Handout    Comprehension Verbalized understanding;Returned demonstration;Need further instruction            PT Short Term Goals - 09/11/19 1336      PT SHORT TERM GOAL #1   Title Patient will demonstrate independent use of home exercise program to maintain progress from in clinic treatments.    Baseline 9/9: neck assessment today; added to HEP, extended x 1 wk    Time 2    Period Weeks    Status Deferred    Target Date 09/18/19             PT Long Term Goals - 09/11/19 1337      PT LONG TERM GOAL #1   Title Patient will demonstrate/report pain at worst less than or equal to 2/10 to facilitate minimal limitation in daily activity secondary to pain symptoms.    Time 8    Period Weeks    Status On-going    Target Date 10/20/19      PT LONG TERM GOAL #2   Title Patient  will demonstrate independent use of home exercise program to  facilitate ability to maintain/progress functional gains from skilled physical therapy services.    Time 8    Period Weeks    Status On-going    Target Date 10/20/19      PT LONG TERM GOAL #3   Title Patient will demonstrate independent ambulation community distances > 300 ft to facilitate community integration at [facility].    Time 8    Period Weeks    Status On-going    Target Date 10/20/19      PT LONG TERM GOAL #4   Title Patient will demonstrate Rt knee AROM 0-110 degrees to facilitate ability to perform transfers, sitting, ambulation, stair navigation s restriction due to mobility.    Time 8    Period Weeks    Status On-going    Target Date 10/20/19      PT LONG TERM GOAL #5   Title Patient will demonstrate Rt LE MMT 5/5 throughout to facilitate ability to perform usual standing, walking, stairs at PLOF s limitation due to symptoms.    Time 8    Period Weeks    Status On-going    Target Date 10/20/19      Additional Long Term Goals   Additional Long Term Goals Yes      PT LONG TERM GOAL #6   Title Pt. will demonstrate bilateral SLS > 15 seconds to facilitate stability in ambulation on even and uneven surfaces for safety in ambulation.    Time 8    Period Weeks    Status On-going    Target Date 10/20/19      PT LONG TERM GOAL #7   Title report pain in Lt neck shoulder to < 4/10 with centralization for improved function    Status New    Target Date 10/20/19      PT LONG TERM GOAL #8   Title improve cervical rotation to at least 60 deg for improved function    Status New    Target Date 10/20/19                 Plan - 09/11/19 1339    Clinical Impression Statement Session today focused on cervical assessment with new referral.  Pt had decreased ROM with radiculopathy, likely due to active trigger points in upper trap and levator scapula.  Limited tolerance to exercises today and will continue to benefit from PT to maximize function.    Personal Factors and  Comorbidities Other    Stability/Clinical Decision Making Stable/Uncomplicated    Clinical Decision Making Low    PT Frequency 2x / week    PT Duration 8 weeks    PT Treatment/Interventions ADLs/Self Care Home Management;Cryotherapy;Electrical Stimulation;Iontophoresis 4mg /ml Dexamethasone;Moist Heat;Balance training;Therapeutic exercise;Therapeutic activities;Functional mobility training;Stair training;Ultrasound;Gait training;Neuromuscular re-education;Patient/family education;Manual techniques;Vasopneumatic Device;Taping;Dry needling;Passive range of motion;Spinal Manipulations;Joint Manipulations    PT Next Visit Plan Strengthening, mobility, balance as able; check STG, review cervical HEP - manual/?DN to UT/LS    PT Home Exercise Plan [email or web address]    Consulted and Agree with Plan of Care Patient           Patient will benefit from skilled therapeutic intervention in order to improve the following deficits and impairments:  Abnormal gait, Decreased endurance, Hypomobility, Increased edema, Decreased activity tolerance, Pain, Decreased strength, Decreased balance, Decreased mobility, Difficulty walking, Increased muscle spasms, Impaired perceived functional ability, Decreased range of motion, Decreased coordination, Impaired flexibility, Increased fascial restricitons  Visit Diagnosis: Chronic pain of  right knee - Plan: PT plan of care cert/re-cert  Muscle weakness (generalized) - Plan: PT plan of care cert/re-cert  Difficulty in walking, not elsewhere classified - Plan: PT plan of care cert/re-cert  Stiffness of right knee, not elsewhere classified - Plan: PT plan of care cert/re-cert  Localized edema - Plan: PT plan of care cert/re-cert  Radiculopathy, cervical region - Plan: PT plan of care cert/re-cert     Problem List Patient Active Problem List   Diagnosis Date Noted  . Status post total knee replacement, right 08/04/2019  . Status post total left knee replacement 01/24/2019    . Primary osteoarthritis of right knee 12/17/2018  . Overweight (BMI 25.0-29.9) 08/08/2017  . Medication management 03/23/2014  . Vitamin D deficiency 03/23/2014  . Abnormal glucose 03/23/2014  . Hyperlipemia 10/31/2006  . GERD 10/31/2006      Clarita Crane, PT, DPT 09/11/19 1:43 PM    [facility] Physical Therapy [address] [place], [place], [address] Phone: [phone]   Fax:  [phone]  Name: Brett [name] MRN: [id number] Date of Birth: [birth year]

## 2019-09-11 NOTE — Patient Instructions (Signed)
Access Code: [email or web address] URL: [email or web address] Date: 09/11/2019 Prepared by: Moshe Cipro  Exercises Supine Heel Slide - 2 x daily - 7 x weekly - 3 sets - 10 reps Seated Long Arc Quad - 2 x daily - 7 x weekly - 10 reps - 3 sets - 2 hold Supine Knee Extension Mobilization with Weight - 4 x daily - 7 x weekly - 1 reps - 1 sets - up to 15 mins hold Seated Straight Leg Heel Taps - 2 x daily - 7 x weekly - 3 sets - 10 reps Gastroc Stretch on Wall - 2 x daily - 7 x weekly - 5 reps - 1 sets - 30 hold Seated Upper Trapezius Stretch - 2 x daily - 7 x weekly - 3 reps - 1 sets - 30 sec hold Seated Levator Scapulae Stretch - 2 x daily - 7 x weekly - 1 reps - 1 sets - 30 sec hold Standing Backward Shoulder Rolls - 2 x daily - 7 x weekly - 10 reps - 1 sets Seated Scapular Retraction - 2 x daily - 7 x weekly - 10 reps - 1 sets - 5 sec hold

## 2019-09-12 ENCOUNTER — Telehealth: Payer: Self-pay | Admitting: *Deleted

## 2019-09-12 NOTE — Telephone Encounter (Signed)
Returned call to patient regarding his [employer]. Per Dr Oneta Rack, even though his arm pain has resolved, he does not suggest restartig the [place], since his pain started after applying the device. Patient has an appointment with Gilford Rile, on 10/13/2019 and will discuss Josephine Igo then. Patient is aware.

## 2019-09-15 ENCOUNTER — Other Ambulatory Visit: Payer: Self-pay

## 2019-09-15 ENCOUNTER — Ambulatory Visit (INDEPENDENT_AMBULATORY_CARE_PROVIDER_SITE_OTHER): Payer: BC Managed Care – PPO | Admitting: Rehabilitative and Restorative Service Providers"

## 2019-09-15 ENCOUNTER — Encounter: Payer: Self-pay | Admitting: Rehabilitative and Restorative Service Providers"

## 2019-09-15 DIAGNOSIS — R600 Localized edema: Secondary | ICD-10-CM

## 2019-09-15 DIAGNOSIS — R262 Difficulty in walking, not elsewhere classified: Secondary | ICD-10-CM | POA: Diagnosis not present

## 2019-09-15 DIAGNOSIS — M25661 Stiffness of right knee, not elsewhere classified: Secondary | ICD-10-CM | POA: Diagnosis not present

## 2019-09-15 DIAGNOSIS — M5412 Radiculopathy, cervical region: Secondary | ICD-10-CM

## 2019-09-15 DIAGNOSIS — M6281 Muscle weakness (generalized): Secondary | ICD-10-CM

## 2019-09-15 DIAGNOSIS — M25561 Pain in right knee: Secondary | ICD-10-CM

## 2019-09-15 DIAGNOSIS — G8929 Other chronic pain: Secondary | ICD-10-CM

## 2019-09-15 NOTE — Therapy (Signed)
[facility] Physical Therapy [address] [place], [place], [address] Phone: [phone]   Fax:  [phone]  Physical Therapy Treatment  Patient Details  Name: Brett [name] MRN: [id number] Date of Birth: [birth year] Referring Provider (PT): Jari Sportsman PA-C   Encounter Date: 09/15/2019   PT End of Session - 09/15/19 1334    Visit Number 6    Number of Visits 16    Date for PT Re-Evaluation 10/20/19    Progress Note Due on Visit 10    PT Start Time 1335    PT Stop Time 1420    PT Time Calculation (min) 45 min    Activity Tolerance Patient limited by pain    Behavior During Therapy [facility] for tasks assessed/performed           Past Medical History:  Diagnosis Date  . Allergy    albuterol as needed  . Anal fissure   . Arthritis   . Colon polyps 01/13/11   Colonoscopy  . GERD (gastroesophageal reflux disease)   . Pneumonia   . Pre-diabetes     Past Surgical History:  Procedure Laterality Date  . ANAL FISSURE REPAIR  02/24/11  . CHONDROPLASTY  08/14/2018   Procedure: CHONDROPLASTY;  Surgeon: Tarry Kos, MD;  Location: [facility];  Service: Orthopedics;;  . COLONOSCOPY    . KNEE ARTHROSCOPY WITH MEDIAL MENISECTOMY Left 08/14/2018   Procedure: LEFT KNEE ARTHROSCOPY WITH PARTIAL MEDIAL MENISCECTOMY;  Surgeon: Tarry Kos, MD;  Location: [facility];  Service: Orthopedics;  Laterality: Left;  . KNEE ARTHROSCOPY WITH MEDIAL MENISECTOMY Right 10/18/2018   Procedure: RIGHT KNEE ARTHROSCOPY WITH PARTIAL MEDIAL MENISCECTOMY;  Surgeon: Tarry Kos, MD;  Location: [facility];  Service: Orthopedics;  Laterality: Right;  . TOTAL KNEE ARTHROPLASTY Left 01/24/2019   Procedure: LEFT TOTAL KNEE ARTHROPLASTY;  Surgeon: Tarry Kos, MD;  [facility];  Service: Orthopedics;  Laterality: Left;  . TOTAL KNEE ARTHROPLASTY Right 08/04/2019   Procedure: RIGHT TOTAL KNEE ARTHROPLASTY;  Surgeon: Tarry Kos, MD;   Location: [facility];  Service: Orthopedics;  Laterality: Right;    There were no vitals filed for this visit.   Subjective Assessment - 09/15/19 1341    Subjective Pt. stated having burning and pain in front of Rt knee after last visit, mentioning bike and leg press as exercises of aggravation.  Pt. stated feeling some reduction of that symptom in last day or so.    Limitations Walking;Standing;Sitting    Patient Stated Goals Reduce pain, improve to independent ambulation.    Currently in Pain? Yes    Pain Location Knee    Pain Orientation Right    Pain Descriptors / Indicators Aching;Burning    Pain Onset More than a month ago    Aggravating Factors  constant, bike/leg press indicated from last visit.    Pain Onset 1 to 4 weeks ago              [facility] PT Assessment - 09/15/19 0001      AROM   Right Knee Extension -5    Right Knee Flexion 106      PROM   Right Knee Extension 0    Right Knee Flexion 108                         [facility] Adult PT Treatment/Exercise - 09/15/19 0001      Neuro Re-ed    Neuro  Re-ed Details  tandem stance c occasional HHA 1 min x 2 bilateral,       Knee/Hip Exercises: Stretches   Gastroc Stretch 3 reps;30 seconds;Both   incline board   Other Knee/Hip Stretches supine TKE c vaso 10 mins      Knee/Hip Exercises: Aerobic   Nustep Lvl 5 10 mins      Knee/Hip Exercises: Machines for Strengthening   Cybex Knee Extension Rt LE eccentric 3 x 10 10 lbs    Cybex Knee Flexion SL Rt LE 3 x 10 10 lbs      Vasopneumatic   Number Minutes Vasopneumatic  10 minutes    Vasopnuematic Location  Knee    Vasopneumatic Pressure Medium    Vasopneumatic Temperature  34                    PT Short Term Goals - 09/15/19 1406      PT SHORT TERM GOAL #1   Title Patient will demonstrate independent use of home exercise program to maintain progress from in clinic treatments.    Baseline 9/9: neck assessment today; added to HEP, extended x 1 wk  ( cues still required at times)    Time 2    Period Weeks    Status On-going    Target Date 09/18/19             PT Long Term Goals - 09/11/19 1337      PT LONG TERM GOAL #1   Title Patient will demonstrate/report pain at worst less than or equal to 2/10 to facilitate minimal limitation in daily activity secondary to pain symptoms.    Time 8    Period Weeks    Status On-going    Target Date 10/20/19      PT LONG TERM GOAL #2   Title Patient will demonstrate independent use of home exercise program to facilitate ability to maintain/progress functional gains from skilled physical therapy services.    Time 8    Period Weeks    Status On-going    Target Date 10/20/19      PT LONG TERM GOAL #3   Title Patient will demonstrate independent ambulation community distances > 300 ft to facilitate community integration at [facility].    Time 8    Period Weeks    Status On-going    Target Date 10/20/19      PT LONG TERM GOAL #4   Title Patient will demonstrate Rt knee AROM 0-110 degrees to facilitate ability to perform transfers, sitting, ambulation, stair navigation s restriction due to mobility.    Time 8    Period Weeks    Status On-going    Target Date 10/20/19      PT LONG TERM GOAL #5   Title Patient will demonstrate Rt LE MMT 5/5 throughout to facilitate ability to perform usual standing, walking, stairs at PLOF s limitation due to symptoms.    Time 8    Period Weeks    Status On-going    Target Date 10/20/19      Additional Long Term Goals   Additional Long Term Goals Yes      PT LONG TERM GOAL #6   Title Pt. will demonstrate bilateral SLS > 15 seconds to facilitate stability in ambulation on even and uneven surfaces for safety in ambulation.    Time 8    Period Weeks    Status On-going    Target Date 10/20/19  PT LONG TERM GOAL #7   Title report pain in Lt neck shoulder to < 4/10 with centralization for improved function    Status New    Target Date 10/20/19       PT LONG TERM GOAL #8   Title improve cervical rotation to at least 60 deg for improved function    Status New    Target Date 10/20/19                 Plan - 09/15/19 1405    Clinical Impression Statement Complaints of Rt knee pain impacted ability to progress strength training, balance intervention in WB at this time.  Pt. may continue to benefit from skilled PT services to improve bilateral LE strenght/movement coordination and knee mobility to facilitate reduced difficulty c functional activity and transition to independent ambulation.    Personal Factors and Comorbidities Other    Stability/Clinical Decision Making Stable/Uncomplicated    PT Frequency 2x / week    PT Duration 8 weeks    PT Treatment/Interventions ADLs/Self Care Home Management;Cryotherapy;Electrical Stimulation;Iontophoresis 4mg /ml Dexamethasone;Moist Heat;Balance training;Therapeutic exercise;Therapeutic activities;Functional mobility training;Stair training;Ultrasound;Gait training;Neuromuscular re-education;Patient/family education;Manual techniques;Vasopneumatic Device;Taping;Dry needling;Passive range of motion;Spinal Manipulations;Joint Manipulations    PT Next Visit Plan Strengthening, mobility, balance as able; review cervical HEP - manual/?DN to UT/LS    PT Home Exercise Plan [id number]    Consulted and Agree with Plan of Care Patient           Patient will benefit from skilled therapeutic intervention in order to improve the following deficits and impairments:  Abnormal gait, Decreased endurance, Hypomobility, Increased edema, Decreased activity tolerance, Pain, Decreased strength, Decreased balance, Decreased mobility, Difficulty walking, Increased muscle spasms, Impaired perceived functional ability, Decreased range of motion, Decreased coordination, Impaired flexibility, Increased fascial restricitons  Visit Diagnosis: Chronic pain of right knee  Muscle weakness (generalized)  Difficulty in  walking, not elsewhere classified  Stiffness of right knee, not elsewhere classified  Localized edema  Radiculopathy, cervical region     Problem List Patient Active Problem List   Diagnosis Date Noted  . Status post total knee replacement, right 08/04/2019  . Status post total left knee replacement 01/24/2019  . Primary osteoarthritis of right knee 12/17/2018  . Overweight (BMI 25.0-29.9) 08/08/2017  . Medication management 03/23/2014  . Vitamin D deficiency 03/23/2014  . Abnormal glucose 03/23/2014  . Hyperlipemia 10/31/2006  . GERD 10/31/2006    11/02/2006, PT, DPT, OCS, ATC 09/15/19  2:24 PM    [facility] Physical Therapy [address] [place], [place], [place] Phone: [phone]   Fax:  [phone]  Name: Brett [name] MRN: Abbe Amsterdam Date of Birth: [birth year]

## 2019-09-16 ENCOUNTER — Ambulatory Visit (INDEPENDENT_AMBULATORY_CARE_PROVIDER_SITE_OTHER): Payer: BC Managed Care – PPO

## 2019-09-16 ENCOUNTER — Ambulatory Visit (INDEPENDENT_AMBULATORY_CARE_PROVIDER_SITE_OTHER): Payer: BC Managed Care – PPO | Admitting: Orthopaedic Surgery

## 2019-09-16 ENCOUNTER — Encounter: Payer: Self-pay | Admitting: Orthopaedic Surgery

## 2019-09-16 DIAGNOSIS — Z96651 Presence of right artificial knee joint: Secondary | ICD-10-CM

## 2019-09-16 MED ORDER — HYDROCODONE-ACETAMINOPHEN 5-325 MG PO TABS: 1.0000 | ORAL_TABLET | Freq: Every day | ORAL | 0 refills | Status: DC | PRN

## 2019-09-16 MED ORDER — METHOCARBAMOL 750 MG PO TABS: 750.0000 mg | ORAL_TABLET | Freq: Two times a day (BID) | ORAL | 2 refills | Status: DC | PRN

## 2019-09-16 NOTE — Progress Notes (Signed)
Post-Op Visit Note   Patient: Brett [name]           Date of Birth: [birth year]           MRN: [id number] Visit Date: 09/16/2019 PCP: Lucky Cowboy, MD   Assessment & Plan:  Chief Complaint:  Chief Complaint  Patient presents with  . Right Knee - Routine Post Op   Visit Diagnoses:  1. Hx of total knee replacement, right     Plan: Brett [name] is 6 weeks status post right total knee replacement on 08/04/2019.  He continues to work with physical therapy.  Is requesting a refill on pain medication and muscle relaxer.  He is currently doing the exercise bike.  No real complaints other than some swelling and discomfort.  Surgical scar is fully healed.  Range of motion is progressing very well.  No signs of infection.  Mild to moderate residual swelling.  No neurovascular compromise.  Brett [name] will continue with physical therapy.  He will remain out of work.  Hydrocodone and Robaxin refilled today.  Recheck in 6 weeks with two-view x-rays of the right knee.  Follow-Up Instructions: Return in about 6 weeks (around 10/28/2019).   Orders:  Orders Placed This Encounter  Procedures  . XR Knee 1-2 Views Right   Meds ordered this encounter  Medications  . methocarbamol (ROBAXIN) 750 MG tablet    Sig: Take 1 tablet (750 mg total) by mouth 2 (two) times daily as needed for muscle spasms.    Dispense:  30 tablet    Refill:  2  . HYDROcodone-acetaminophen (NORCO) 5-325 MG tablet    Sig: Take 1-2 tablets by mouth daily as needed.    Dispense:  20 tablet    Refill:  0    Imaging: XR Knee 1-2 Views Right  Result Date: 09/16/2019 Stable total knee replacement in good alignment    PMFS History: Patient Active Problem List   Diagnosis Date Noted  . Status post total knee replacement, right 08/04/2019  . Status post total left knee replacement 01/24/2019  . Primary osteoarthritis of right knee 12/17/2018  . Overweight (BMI 25.0-29.9) 08/08/2017  . Medication management 03/23/2014    . Vitamin D deficiency 03/23/2014  . Abnormal glucose 03/23/2014  . Hyperlipemia 10/31/2006  . GERD 10/31/2006   Past Medical History:  Diagnosis Date  . Allergy    albuterol as needed  . Anal fissure   . Arthritis   . Colon polyps 01/13/11   Colonoscopy  . GERD (gastroesophageal reflux disease)   . Pneumonia   . Pre-diabetes     Family History  Problem Relation Age of Onset  . Rheum arthritis Mother   . Heart disease Mother   . Diabetes Mother   . Arthritis Mother   . Diabetes Father   . Blindness Father        from DM?  Marland Kitchen Allergies Daughter   . Allergies Sister   . Asthma Daughter     Past Surgical History:  Procedure Laterality Date  . ANAL FISSURE REPAIR  02/24/11  . CHONDROPLASTY  08/14/2018   Procedure: CHONDROPLASTY;  Surgeon: Tarry Kos, MD;  Location: [facility];  Service: Orthopedics;;  . COLONOSCOPY    . KNEE ARTHROSCOPY WITH MEDIAL MENISECTOMY Left 08/14/2018   Procedure: LEFT KNEE ARTHROSCOPY WITH PARTIAL MEDIAL MENISCECTOMY;  Surgeon: Tarry Kos, MD;  Location: [facility];  Service: Orthopedics;  Laterality: Left;  . KNEE ARTHROSCOPY WITH MEDIAL MENISECTOMY Right 10/18/2018  Procedure: RIGHT KNEE ARTHROSCOPY WITH PARTIAL MEDIAL MENISCECTOMY;  Surgeon: Tarry Kos, MD;  Location: [facility];  Service: Orthopedics;  Laterality: Right;  . TOTAL KNEE ARTHROPLASTY Left 01/24/2019   Procedure: LEFT TOTAL KNEE ARTHROPLASTY;  Surgeon: Tarry Kos, MD;  [facility];  Service: Orthopedics;  Laterality: Left;  . TOTAL KNEE ARTHROPLASTY Right 08/04/2019   Procedure: RIGHT TOTAL KNEE ARTHROPLASTY;  Surgeon: Tarry Kos, MD;  Location: [facility];  Service: Orthopedics;  Laterality: Right;   Social History   Occupational History    Employer: [employer]  Tobacco Use  . Smoking status: Former Smoker    Packs/day: 0.25    Years: 27.00    Pack years: 6.75    Types: Cigarettes    Quit date: 08/20/2018    Years since  quitting: 1.0  . Smokeless tobacco: Never Used  Vaping Use  . Vaping Use: Never used  Substance and Sexual Activity  . Alcohol use: Yes    Comment: socially  . Drug use: No  . Sexual activity: Not on file

## 2019-09-17 ENCOUNTER — Other Ambulatory Visit: Payer: Self-pay

## 2019-09-17 ENCOUNTER — Ambulatory Visit (INDEPENDENT_AMBULATORY_CARE_PROVIDER_SITE_OTHER): Payer: BC Managed Care – PPO | Admitting: Rehabilitative and Restorative Service Providers"

## 2019-09-17 ENCOUNTER — Encounter: Payer: Self-pay | Admitting: Rehabilitative and Restorative Service Providers"

## 2019-09-17 DIAGNOSIS — M25561 Pain in right knee: Secondary | ICD-10-CM

## 2019-09-17 DIAGNOSIS — M6281 Muscle weakness (generalized): Secondary | ICD-10-CM | POA: Diagnosis not present

## 2019-09-17 DIAGNOSIS — M25661 Stiffness of right knee, not elsewhere classified: Secondary | ICD-10-CM

## 2019-09-17 DIAGNOSIS — G8929 Other chronic pain: Secondary | ICD-10-CM

## 2019-09-17 DIAGNOSIS — R600 Localized edema: Secondary | ICD-10-CM

## 2019-09-17 DIAGNOSIS — R262 Difficulty in walking, not elsewhere classified: Secondary | ICD-10-CM | POA: Diagnosis not present

## 2019-09-17 DIAGNOSIS — M5412 Radiculopathy, cervical region: Secondary | ICD-10-CM

## 2019-09-17 NOTE — Therapy (Signed)
[facility] Physical Therapy [address] [place], [place], [address] Phone: [phone]   Fax:  [phone]  Physical Therapy Treatment  Patient Details  Name: Abu Heavin MRN: [id number] Date of Birth: 1960-10-07 Referring Provider (PT): Jari Sportsman PA-C   Encounter Date: 09/17/2019   PT End of Session - 09/17/19 1343    Visit Number 7    Number of Visits 16    Date for PT Re-Evaluation 10/20/19    Progress Note Due on Visit 10    PT Start Time 1343    PT Stop Time 1430    PT Time Calculation (min) 47 min    Activity Tolerance Other (comment)   tolerance fair   Behavior During Therapy [facility] for tasks assessed/performed           Past Medical History:  Diagnosis Date  . Allergy    albuterol as needed  . Anal fissure   . Arthritis   . Colon polyps 01/13/11   Colonoscopy  . GERD (gastroesophageal reflux disease)   . Pneumonia   . Pre-diabetes     Past Surgical History:  Procedure Laterality Date  . ANAL FISSURE REPAIR  02/24/11  . CHONDROPLASTY  08/14/2018   Procedure: CHONDROPLASTY;  Surgeon: Tarry Kos, MD;  Location: [facility];  Service: Orthopedics;;  . COLONOSCOPY    . KNEE ARTHROSCOPY WITH MEDIAL MENISECTOMY Left 08/14/2018   Procedure: LEFT KNEE ARTHROSCOPY WITH PARTIAL MEDIAL MENISCECTOMY;  Surgeon: Tarry Kos, MD;  Location: [facility];  Service: Orthopedics;  Laterality: Left;  . KNEE ARTHROSCOPY WITH MEDIAL MENISECTOMY Right 10/18/2018   Procedure: RIGHT KNEE ARTHROSCOPY WITH PARTIAL MEDIAL MENISCECTOMY;  Surgeon: Tarry Kos, MD;  Location: [facility];  Service: Orthopedics;  Laterality: Right;  . TOTAL KNEE ARTHROPLASTY Left 01/24/2019   Procedure: LEFT TOTAL KNEE ARTHROPLASTY;  Surgeon: Tarry Kos, MD;  [facility];  Service: Orthopedics;  Laterality: Left;  . TOTAL KNEE ARTHROPLASTY Right 08/04/2019   Procedure: RIGHT TOTAL KNEE ARTHROPLASTY;  Surgeon: Tarry Kos, MD;   [facility];  Service: Orthopedics;  Laterality: Right;    There were no vitals filed for this visit.   Subjective Assessment - 09/17/19 1345    Subjective Pt. indicated taking medicine not long ago, had pain 7-8/10 or so.    Limitations Walking;Standing;Sitting    Patient Stated Goals Reduce pain, improve to independent ambulation.    Currently in Pain? Yes    Pain Score 7     Pain Location Knee    Pain Orientation Right    Pain Descriptors / Indicators Aching    Pain Onset More than a month ago    Pain Frequency Constant    Aggravating Factors  insidious constant    Pain Relieving Factors medication    Multiple Pain Sites No    Pain Onset 1 to 4 weeks ago                             [facility] Adult PT Treatment/Exercise - 09/17/19 0001      Neuro Re-ed    Neuro Re-ed Details  lateral stepping 3 cones x 5 each bilateral, SLS on airex foam c moderate HHA corrections 15 sec x 4 bilateral      Knee/Hip Exercises: Stretches   Other Knee/Hip Stretches supine TKE c vaso 10 mins      Knee/Hip Exercises: Aerobic   Nustep Lvl  5 10 mins      Knee/Hip Exercises: Machines for Strengthening   Cybex Knee Extension Rt LE eccentric 3 x 10 10 lbs    Cybex Knee Flexion SL Rt LE 3 x 10 10 lbs      Knee/Hip Exercises: Standing   Forward Step Up Right   10x 4 inch step, 2 x 10 6 inch step     Vasopneumatic   Number Minutes Vasopneumatic  10 minutes    Vasopnuematic Location  Knee    Vasopneumatic Pressure Medium    Vasopneumatic Temperature  34                    PT Short Term Goals - 09/15/19 1406      PT SHORT TERM GOAL #1   Title Patient will demonstrate independent use of home exercise program to maintain progress from in clinic treatments.    Baseline 9/9: neck assessment today; added to HEP, extended x 1 wk ( cues still required at times)    Time 2    Period Weeks    Status On-going    Target Date 09/18/19             PT Long Term Goals -  09/11/19 1337      PT LONG TERM GOAL #1   Title Patient will demonstrate/report pain at worst less than or equal to 2/10 to facilitate minimal limitation in daily activity secondary to pain symptoms.    Time 8    Period Weeks    Status On-going    Target Date 10/20/19      PT LONG TERM GOAL #2   Title Patient will demonstrate independent use of home exercise program to facilitate ability to maintain/progress functional gains from skilled physical therapy services.    Time 8    Period Weeks    Status On-going    Target Date 10/20/19      PT LONG TERM GOAL #3   Title Patient will demonstrate independent ambulation community distances > 300 ft to facilitate community integration at [facility].    Time 8    Period Weeks    Status On-going    Target Date 10/20/19      PT LONG TERM GOAL #4   Title Patient will demonstrate Rt knee AROM 0-110 degrees to facilitate ability to perform transfers, sitting, ambulation, stair navigation s restriction due to mobility.    Time 8    Period Weeks    Status On-going    Target Date 10/20/19      PT LONG TERM GOAL #5   Title Patient will demonstrate Rt LE MMT 5/5 throughout to facilitate ability to perform usual standing, walking, stairs at PLOF s limitation due to symptoms.    Time 8    Period Weeks    Status On-going    Target Date 10/20/19      Additional Long Term Goals   Additional Long Term Goals Yes      PT LONG TERM GOAL #6   Title Pt. will demonstrate bilateral SLS > 15 seconds to facilitate stability in ambulation on even and uneven surfaces for safety in ambulation.    Time 8    Period Weeks    Status On-going    Target Date 10/20/19      PT LONG TERM GOAL #7   Title report pain in Lt neck shoulder to < 4/10 with centralization for improved function    Status New  Target Date 10/20/19      PT LONG TERM GOAL #8   Title improve cervical rotation to at least 60 deg for improved function    Status New    Target Date 10/20/19                   Plan - 09/17/19 1411    Clinical Impression Statement Fair movement control noted in assessment in dynamic stability, compliant surface analysis today as documented.    Personal Factors and Comorbidities Other    Stability/Clinical Decision Making Stable/Uncomplicated    PT Frequency 2x / week    PT Duration 8 weeks    PT Treatment/Interventions ADLs/Self Care Home Management;Cryotherapy;Electrical Stimulation;Iontophoresis 4mg /ml Dexamethasone;Moist Heat;Balance training;Therapeutic exercise;Therapeutic activities;Functional mobility training;Stair training;Ultrasound;Gait training;Neuromuscular re-education;Patient/family education;Manual techniques;Vasopneumatic Device;Taping;Dry needling;Passive range of motion;Spinal Manipulations;Joint Manipulations    PT Next Visit Plan Continued improved activity tolerance, Rt LE strength/balance; - manual//DN to UT/LS prn    PT Home Exercise Plan [id number]    Consulted and Agree with Plan of Care Patient           Patient will benefit from skilled therapeutic intervention in order to improve the following deficits and impairments:  Abnormal gait, Decreased endurance, Hypomobility, Increased edema, Decreased activity tolerance, Pain, Decreased strength, Decreased balance, Decreased mobility, Difficulty walking, Increased muscle spasms, Impaired perceived functional ability, Decreased range of motion, Decreased coordination, Impaired flexibility, Increased fascial restricitons  Visit Diagnosis: Chronic pain of right knee  Muscle weakness (generalized)  Difficulty in walking, not elsewhere classified  Stiffness of right knee, not elsewhere classified  Localized edema  Radiculopathy, cervical region     Problem List Patient Active Problem List   Diagnosis Date Noted  . Status post total knee replacement, right 08/04/2019  . Status post total left knee replacement 01/24/2019  . Primary osteoarthritis of right knee  12/17/2018  . Overweight (BMI 25.0-29.9) 08/08/2017  . Medication management 03/23/2014  . Vitamin D deficiency 03/23/2014  . Abnormal glucose 03/23/2014  . Hyperlipemia 10/31/2006  . GERD 10/31/2006    11/02/2006, PT, DPT, OCS, ATC 09/17/19  2:17 PM    [facility] Physical Therapy [address] [place], [place], [place] Phone: [phone]   Fax:  [phone]  Name: Mubashir Mallek MRN: Abbe Amsterdam Date of Birth: 01/17/1960

## 2019-09-19 ENCOUNTER — Other Ambulatory Visit: Payer: BC Managed Care – PPO

## 2019-09-19 ENCOUNTER — Other Ambulatory Visit: Payer: Self-pay | Admitting: *Deleted

## 2019-09-19 DIAGNOSIS — Z20822 Contact with and (suspected) exposure to covid-19: Secondary | ICD-10-CM

## 2019-09-22 ENCOUNTER — Telehealth: Payer: Self-pay | Admitting: Physician Assistant

## 2019-09-22 ENCOUNTER — Ambulatory Visit (INDEPENDENT_AMBULATORY_CARE_PROVIDER_SITE_OTHER): Payer: BC Managed Care – PPO | Admitting: Rehabilitative and Restorative Service Providers"

## 2019-09-22 ENCOUNTER — Other Ambulatory Visit: Payer: Self-pay

## 2019-09-22 ENCOUNTER — Encounter: Payer: Self-pay | Admitting: Rehabilitative and Restorative Service Providers"

## 2019-09-22 ENCOUNTER — Other Ambulatory Visit: Payer: Self-pay | Admitting: Physician Assistant

## 2019-09-22 DIAGNOSIS — M6281 Muscle weakness (generalized): Secondary | ICD-10-CM | POA: Diagnosis not present

## 2019-09-22 DIAGNOSIS — M25561 Pain in right knee: Secondary | ICD-10-CM

## 2019-09-22 DIAGNOSIS — M25661 Stiffness of right knee, not elsewhere classified: Secondary | ICD-10-CM

## 2019-09-22 DIAGNOSIS — G8929 Other chronic pain: Secondary | ICD-10-CM

## 2019-09-22 DIAGNOSIS — R262 Difficulty in walking, not elsewhere classified: Secondary | ICD-10-CM | POA: Diagnosis not present

## 2019-09-22 DIAGNOSIS — M5412 Radiculopathy, cervical region: Secondary | ICD-10-CM

## 2019-09-22 DIAGNOSIS — R600 Localized edema: Secondary | ICD-10-CM

## 2019-09-22 LAB — NOVEL CORONAVIRUS, NAA: SARS-CoV-2, NAA: NOT DETECTED

## 2019-09-22 MED ORDER — HYDROCODONE-ACETAMINOPHEN 5-325 MG PO TABS: 1.0000 | ORAL_TABLET | Freq: Two times a day (BID) | ORAL | 0 refills | Status: DC | PRN

## 2019-09-22 NOTE — Therapy (Addendum)
[facility] Physical Therapy [address] [place], [place], [address] Phone: [phone]   Fax:  [phone]  Physical Therapy Treatment  Patient Details  Name: Brett [name] MRN: [id number] Date of Birth: [birth year] Referring Provider (PT): Dwana Melena PA-C   Encounter Date: 09/22/2019   PT End of Session - 09/22/19 1349    Visit Number 8    Number of Visits 16    Date for PT Re-Evaluation 10/20/19    Progress Note Due on Visit 10    PT Start Time 7026    PT Stop Time 1434    PT Time Calculation (min) 45 min    Activity Tolerance Other (comment)   tolerance fair   Behavior During Therapy [facility] for tasks assessed/performed           Past Medical History:  Diagnosis Date  . Allergy    albuterol as needed  . Anal fissure   . Arthritis   . Colon polyps 01/13/11   Colonoscopy  . GERD (gastroesophageal reflux disease)   . Pneumonia   . Pre-diabetes     Past Surgical History:  Procedure Laterality Date  . ANAL FISSURE REPAIR  02/24/11  . CHONDROPLASTY  08/14/2018   Procedure: CHONDROPLASTY;  Surgeon: Leandrew Koyanagi, MD;  Location: [place];  Service: Orthopedics;;  . COLONOSCOPY    . KNEE ARTHROSCOPY WITH MEDIAL MENISECTOMY Left 08/14/2018   Procedure: LEFT KNEE ARTHROSCOPY WITH PARTIAL MEDIAL MENISCECTOMY;  Surgeon: Leandrew Koyanagi, MD;  Location: [place];  Service: Orthopedics;  Laterality: Left;  . KNEE ARTHROSCOPY WITH MEDIAL MENISECTOMY Right 10/18/2018   Procedure: RIGHT KNEE ARTHROSCOPY WITH PARTIAL MEDIAL MENISCECTOMY;  Surgeon: Leandrew Koyanagi, MD;  Location: [place];  Service: Orthopedics;  Laterality: Right;  . TOTAL KNEE ARTHROPLASTY Left 01/24/2019   Procedure: LEFT TOTAL KNEE ARTHROPLASTY;  Surgeon: Leandrew Koyanagi, MD;  Location: [place];  Service: Orthopedics;  Laterality: Left;  . TOTAL KNEE ARTHROPLASTY Right 08/04/2019   Procedure: RIGHT TOTAL KNEE ARTHROPLASTY;  Surgeon: Leandrew Koyanagi, MD;   Location: [place];  Service: Orthopedics;  Laterality: Right;    There were no vitals filed for this visit.   Subjective Assessment - 09/22/19 1353    Subjective Pt. reported pain was bad on weekend for knee and also neck.  "just hurting, bad at times."    Limitations Walking;Standing;Sitting    Patient Stated Goals Reduce pain, improve to independent ambulation.    Currently in Pain? Yes    Pain Location Knee    Pain Orientation Right    Pain Onset More than a month ago    Pain Frequency Constant    Aggravating Factors  constant continud    Pain Relieving Factors medicine    Pain Location Neck    Pain Orientation Left    Pain Descriptors / Indicators Aching;Shooting    Pain Type Acute pain    Pain Onset 1 to 4 weeks ago    Pain Frequency Intermittent                             [facility] Adult PT Treatment/Exercise - 09/22/19 0001      Neuro Re-ed    Neuro Re-ed Details  tandem ambulation foam fwd 8 ft x 12, SLS on foam 1 min bilateral occasional HHA,       Knee/Hip Exercises: Stretches   Other Knee/Hip Stretches supine TKE c vaso 10  mins      Knee/Hip Exercises: Machines for Strengthening   Cybex Knee Extension Rt LE eccentric 3 x 10 10 lbs    Cybex Knee Flexion SL Rt LE 3 x 10 10 lbs      Knee/Hip Exercises: Standing   Other Standing Knee Exercises squat, heels on foam 2 x 10      Vasopneumatic   Number Minutes Vasopneumatic  10 minutes    Vasopnuematic Location  Knee   Rt   Vasopneumatic Pressure Medium    Vasopneumatic Temperature  34      Manual Therapy   Manual therapy comments active compression to Lt upper trap                  PT Education - 09/22/19 1414    Education Details DN    Person(s) Educated Patient    Methods Handout;Explanation    Comprehension Verbalized understanding            PT Short Term Goals - 09/22/19 1354      PT SHORT TERM GOAL #1   Title Patient will demonstrate independent use of home exercise  program to maintain progress from in clinic treatments.    Baseline 09/22/2019: required cues for techniques at times    Time 2    Period Weeks    Status Partially Met    Target Date 09/18/19             PT Long Term Goals - 09/11/19 1337      PT LONG TERM GOAL #1   Title Patient will demonstrate/report pain at worst less than or equal to 2/10 to facilitate minimal limitation in daily activity secondary to pain symptoms.    Time 8    Period Weeks    Status On-going    Target Date 10/20/19      PT LONG TERM GOAL #2   Title Patient will demonstrate independent use of home exercise program to facilitate ability to maintain/progress functional gains from skilled physical therapy services.    Time 8    Period Weeks    Status On-going    Target Date 10/20/19      PT LONG TERM GOAL #3   Title Patient will demonstrate independent ambulation community distances > 300 ft to facilitate community integration at [facility].    Time 8    Period Weeks    Status On-going    Target Date 10/20/19      PT LONG TERM GOAL #4   Title Patient will demonstrate Rt knee AROM 0-110 degrees to facilitate ability to perform transfers, sitting, ambulation, stair navigation s restriction due to mobility.    Time 8    Period Weeks    Status On-going    Target Date 10/20/19      PT LONG TERM GOAL #5   Title Patient will demonstrate Rt LE MMT 5/5 throughout to facilitate ability to perform usual standing, walking, stairs at PLOF s limitation due to symptoms.    Time 8    Period Weeks    Status On-going    Target Date 10/20/19      Additional Long Term Goals   Additional Long Term Goals Yes      PT LONG TERM GOAL #6   Title Pt. will demonstrate bilateral SLS > 15 seconds to facilitate stability in ambulation on even and uneven surfaces for safety in ambulation.    Time 8    Period Weeks    Status  On-going    Target Date 10/20/19      PT LONG TERM GOAL #7   Title report pain in Lt neck shoulder to <  4/10 with centralization for improved function    Status New    Target Date 10/20/19      PT LONG TERM GOAL #8   Title improve cervical rotation to at least 60 deg for improved function    Status New    Target Date 10/20/19                 Plan - 09/22/19 1410    Clinical Impression Statement Pt. ambulated c improved stance and step lengths bilateral c reduced reliance on SPC during movement.  Conitnued moderate to severe consistent pain levels indicated from Rt knee at this time despite functional gains.    Personal Factors and Comorbidities Other    Stability/Clinical Decision Making Stable/Uncomplicated    PT Frequency 2x / week    PT Duration 8 weeks    PT Treatment/Interventions ADLs/Self Care Home Management;Cryotherapy;Electrical Stimulation;Iontophoresis 36m/ml Dexamethasone;Moist Heat;Balance training;Therapeutic exercise;Therapeutic activities;Functional mobility training;Stair training;Ultrasound;Gait training;Neuromuscular re-education;Patient/family education;Manual techniques;Vasopneumatic Device;Taping;Dry needling;Passive range of motion;Spinal Manipulations;Joint Manipulations    PT Next Visit Plan Compliant surface balance, soft tissue cervical as needed.    PT Home Exercise Plan [id number]    Consulted and Agree with Plan of Care Patient           Patient will benefit from skilled therapeutic intervention in order to improve the following deficits and impairments:  Abnormal gait, Decreased endurance, Hypomobility, Increased edema, Decreased activity tolerance, Pain, Decreased strength, Decreased balance, Decreased mobility, Difficulty walking, Increased muscle spasms, Impaired perceived functional ability, Decreased range of motion, Decreased coordination, Impaired flexibility, Increased fascial restricitons  Visit Diagnosis: Chronic pain of right knee  Muscle weakness (generalized)  Difficulty in walking, not elsewhere classified  Stiffness of right knee,  not elsewhere classified  Localized edema  Radiculopathy, cervical region     Problem List Patient Active Problem List   Diagnosis Date Noted  . Status post total knee replacement, right 08/04/2019  . Status post total left knee replacement 01/24/2019  . Primary osteoarthritis of right knee 12/17/2018  . Overweight (BMI 25.0-29.9) 08/08/2017  . Medication management 03/23/2014  . Vitamin D deficiency 03/23/2014  . Abnormal glucose 03/23/2014  . Hyperlipemia 10/31/2006  . GERD 10/31/2006   MScot Jun PT, DPT, OCS, ATC 09/22/19  2:32 PM    [facility] [address] [address]Phone: [phone]  Fax:  [phone] Name: ATyreke KaeserMRN: [id number] of Birth: 124-May-1962

## 2019-09-22 NOTE — Telephone Encounter (Signed)
I sent in norco.  Cannot refill prednisone.  Suggest to him to take 800mg  ibuprofen tid

## 2019-09-22 NOTE — Telephone Encounter (Signed)
Patient called requesting a refill of prednisone and hydrocodone. Patient is asking for a higher dosage of hydrocodone pain is severe. Please send to pharmacy on file. Please call patient when medication has been called in. Patient phone number is [phone].

## 2019-09-24 ENCOUNTER — Encounter: Payer: Self-pay | Admitting: Rehabilitative and Restorative Service Providers"

## 2019-09-24 ENCOUNTER — Ambulatory Visit (INDEPENDENT_AMBULATORY_CARE_PROVIDER_SITE_OTHER): Payer: BC Managed Care – PPO | Admitting: Rehabilitative and Restorative Service Providers"

## 2019-09-24 ENCOUNTER — Other Ambulatory Visit: Payer: Self-pay

## 2019-09-24 DIAGNOSIS — M6281 Muscle weakness (generalized): Secondary | ICD-10-CM

## 2019-09-24 DIAGNOSIS — M25561 Pain in right knee: Secondary | ICD-10-CM

## 2019-09-24 DIAGNOSIS — G8929 Other chronic pain: Secondary | ICD-10-CM

## 2019-09-24 DIAGNOSIS — R262 Difficulty in walking, not elsewhere classified: Secondary | ICD-10-CM

## 2019-09-24 DIAGNOSIS — M25661 Stiffness of right knee, not elsewhere classified: Secondary | ICD-10-CM

## 2019-09-24 DIAGNOSIS — R600 Localized edema: Secondary | ICD-10-CM

## 2019-09-24 DIAGNOSIS — M5412 Radiculopathy, cervical region: Secondary | ICD-10-CM

## 2019-09-24 NOTE — Telephone Encounter (Signed)
See message.

## 2019-09-24 NOTE — Therapy (Signed)
[facility] Physical Therapy [address] [place], [place], [address] Phone: [phone]   Fax:  [phone]  Physical Therapy Treatment  Patient Details  Name: Brett [name] MRN: [id number] Date of Birth: 27-Jul-1960 Referring Provider (PT): Dwana Melena PA-C   Encounter Date: 09/24/2019   PT End of Session - 09/24/19 1407    Visit Number 9    Number of Visits 16    Date for PT Re-Evaluation 10/20/19    Progress Note Due on Visit 10    PT Start Time 1350    PT Stop Time 1435    PT Time Calculation (min) 45 min    Activity Tolerance Patient limited by pain   tolerance fair   Behavior During Therapy [facility] for tasks assessed/performed           Past Medical History:  Diagnosis Date  . Allergy    albuterol as needed  . Anal fissure   . Arthritis   . Colon polyps 01/13/11   Colonoscopy  . GERD (gastroesophageal reflux disease)   . Pneumonia   . Pre-diabetes     Past Surgical History:  Procedure Laterality Date  . ANAL FISSURE REPAIR  02/24/11  . CHONDROPLASTY  08/14/2018   Procedure: CHONDROPLASTY;  Surgeon: Leandrew Koyanagi, MD;  Location: [place];  Service: Orthopedics;;  . COLONOSCOPY    . KNEE ARTHROSCOPY WITH MEDIAL MENISECTOMY Left 08/14/2018   Procedure: LEFT KNEE ARTHROSCOPY WITH PARTIAL MEDIAL MENISCECTOMY;  Surgeon: Leandrew Koyanagi, MD;  Location: [place];  Service: Orthopedics;  Laterality: Left;  . KNEE ARTHROSCOPY WITH MEDIAL MENISECTOMY Right 10/18/2018   Procedure: RIGHT KNEE ARTHROSCOPY WITH PARTIAL MEDIAL MENISCECTOMY;  Surgeon: Leandrew Koyanagi, MD;  Location: [place];  Service: Orthopedics;  Laterality: Right;  . TOTAL KNEE ARTHROPLASTY Left 01/24/2019   Procedure: LEFT TOTAL KNEE ARTHROPLASTY;  Surgeon: Leandrew Koyanagi, MD;  Location: [place];  Service: Orthopedics;  Laterality: Left;  . TOTAL KNEE ARTHROPLASTY Right 08/04/2019   Procedure: RIGHT TOTAL KNEE ARTHROPLASTY;  Surgeon: Leandrew Koyanagi, MD;  Location: [place];  Service: Orthopedics;  Laterality: Right;    There were no vitals filed for this visit.   Subjective Assessment - 09/24/19 1354    Subjective Pt. stated feeling pain in middle of knee at times with sleeping more.  Pt. stated neck continued with maybe some improvement after last visit.    Limitations Walking;Standing;Sitting    Patient Stated Goals Reduce pain, improve to independent ambulation.    Currently in Pain? Yes    Pain Onset More than a month ago    Pain Onset 1 to 4 weeks ago                             [facility] Adult PT Treatment/Exercise - 09/24/19 0001      Exercises   Exercises Other Exercises    Other Exercises  Additional time spent in exercise for rest periods, slower performance speed secondary to symptoms      Knee/Hip Exercises: Stretches   Gastroc Stretch 3 reps;30 seconds;Both    Other Knee/Hip Stretches supine TKE c vaso 10 mins      Knee/Hip Exercises: Aerobic   Nustep Lvl 6 10 mins c multiple pauses for rest      Knee/Hip Exercises: Machines for Strengthening   Cybex Leg Press 37 lbs SL 3 x 10, performed bilateral  Knee/Hip Exercises: Standing   Forward Step Up Both;2 sets;10 reps   4 inch step Rt LE, 6 in step Lt LE     Vasopneumatic   Number Minutes Vasopneumatic  10 minutes    Vasopnuematic Location  Knee    Vasopneumatic Pressure Medium    Vasopneumatic Temperature  34                    PT Short Term Goals - 09/22/19 1354      PT SHORT TERM GOAL #1   Title Patient will demonstrate independent use of home exercise program to maintain progress from in clinic treatments.    Baseline 09/22/2019: required cues for techniques at times    Time 2    Period Weeks    Status Partially Met    Target Date 09/18/19             PT Long Term Goals - 09/11/19 1337      PT LONG TERM GOAL #1   Title Patient will demonstrate/report pain at worst less than or equal to 2/10 to facilitate  minimal limitation in daily activity secondary to pain symptoms.    Time 8    Period Weeks    Status On-going    Target Date 10/20/19      PT LONG TERM GOAL #2   Title Patient will demonstrate independent use of home exercise program to facilitate ability to maintain/progress functional gains from skilled physical therapy services.    Time 8    Period Weeks    Status On-going    Target Date 10/20/19      PT LONG TERM GOAL #3   Title Patient will demonstrate independent ambulation community distances > 300 ft to facilitate community integration at [facility]    Time 8    Period Weeks    Status On-going    Target Date 10/20/19      PT LONG TERM GOAL #4   Title Patient will demonstrate Rt knee AROM 0-110 degrees to facilitate ability to perform transfers, sitting, ambulation, stair navigation s restriction due to mobility.    Time 8    Period Weeks    Status On-going    Target Date 10/20/19      PT LONG TERM GOAL #5   Title Patient will demonstrate Rt LE MMT 5/5 throughout to facilitate ability to perform usual standing, walking, stairs at PLOF s limitation due to symptoms.    Time 8    Period Weeks    Status On-going    Target Date 10/20/19      Additional Long Term Goals   Additional Long Term Goals Yes      PT LONG TERM GOAL #6   Title Pt. will demonstrate bilateral SLS > 15 seconds to facilitate stability in ambulation on even and uneven surfaces for safety in ambulation.    Time 8    Period Weeks    Status On-going    Target Date 10/20/19      PT LONG TERM GOAL #7   Title report pain in Lt neck shoulder to < 4/10 with centralization for improved function    Status New    Target Date 10/20/19      PT LONG TERM GOAL #8   Title improve cervical rotation to at least 60 deg for improved function    Status New    Target Date 10/20/19  Plan - 09/24/19 1416    Clinical Impression Statement Continued deficits in LE strength bilateral c evidence of  limitation in ambulation independence progression as well as functional movements such as transfers, stairs, squats c associated symptoms still present.    Personal Factors and Comorbidities Other    Stability/Clinical Decision Making Stable/Uncomplicated    PT Frequency 2x / week    PT Duration 8 weeks    PT Treatment/Interventions ADLs/Self Care Home Management;Cryotherapy;Electrical Stimulation;Iontophoresis 88m/ml Dexamethasone;Moist Heat;Balance training;Therapeutic exercise;Therapeutic activities;Functional mobility training;Stair training;Ultrasound;Gait training;Neuromuscular re-education;Patient/family education;Manual techniques;Vasopneumatic Device;Taping;Dry needling;Passive range of motion;Spinal Manipulations;Joint Manipulations    PT Next Visit Plan Continue to progress strength/balance control as tolerated for improved progressive mobility. [place] note next visit    PT Home Exercise Plan [id number]    Consulted and Agree with Plan of Care Patient           Patient will benefit from skilled therapeutic intervention in order to improve the following deficits and impairments:  Abnormal gait, Decreased endurance, Hypomobility, Increased edema, Decreased activity tolerance, Pain, Decreased strength, Decreased balance, Decreased mobility, Difficulty walking, Increased muscle spasms, Impaired perceived functional ability, Decreased range of motion, Decreased coordination, Impaired flexibility, Increased fascial restricitons  Visit Diagnosis: Chronic pain of right knee  Muscle weakness (generalized)  Difficulty in walking, not elsewhere classified  Stiffness of right knee, not elsewhere classified  Localized edema  Radiculopathy, cervical region     Problem List Patient Active Problem List   Diagnosis Date Noted  . Status post total knee replacement, right 08/04/2019  . Status post total left knee replacement 01/24/2019  . Primary osteoarthritis of right knee 12/17/2018    . Overweight (BMI 25.0-29.9) 08/08/2017  . Medication management 03/23/2014  . Vitamin D deficiency 03/23/2014  . Abnormal glucose 03/23/2014  . Hyperlipemia 10/31/2006  . GERD 10/31/2006    MScot Jun PT, DPT, OCS, ATC 09/24/19  2:34 PM    [facility] [address] [address]Phone: [phone]  Fax:  [phone] Name: Brett SchallerMRN: [id number] of Birth: 125-Sep-1962

## 2019-09-24 NOTE — Telephone Encounter (Signed)
Would like ibuprofen sent to his pharm also.

## 2019-09-25 ENCOUNTER — Other Ambulatory Visit: Payer: Self-pay

## 2019-09-25 MED ORDER — IBUPROFEN 800 MG PO TABS: 800.0000 mg | ORAL_TABLET | Freq: Three times a day (TID) | ORAL | 0 refills | Status: DC | PRN

## 2019-09-25 MED ORDER — BLOOD GLUCOSE METER KIT: PACK | 0 refills | Status: AC

## 2019-09-25 MED ORDER — BLOOD GLUCOSE METER KIT: PACK | 0 refills | Status: DC

## 2019-09-25 NOTE — Telephone Encounter (Signed)
Can you send in

## 2019-09-25 NOTE — Telephone Encounter (Signed)
Rx in chart and sent to pharm.

## 2019-09-25 NOTE — Progress Notes (Signed)
ibuprofen

## 2019-09-29 ENCOUNTER — Other Ambulatory Visit: Payer: Self-pay

## 2019-09-29 ENCOUNTER — Ambulatory Visit (INDEPENDENT_AMBULATORY_CARE_PROVIDER_SITE_OTHER): Payer: BC Managed Care – PPO | Admitting: Rehabilitative and Restorative Service Providers"

## 2019-09-29 ENCOUNTER — Encounter: Payer: Self-pay | Admitting: Rehabilitative and Restorative Service Providers"

## 2019-09-29 DIAGNOSIS — M6281 Muscle weakness (generalized): Secondary | ICD-10-CM | POA: Diagnosis not present

## 2019-09-29 DIAGNOSIS — M25661 Stiffness of right knee, not elsewhere classified: Secondary | ICD-10-CM

## 2019-09-29 DIAGNOSIS — R262 Difficulty in walking, not elsewhere classified: Secondary | ICD-10-CM | POA: Diagnosis not present

## 2019-09-29 DIAGNOSIS — M25561 Pain in right knee: Secondary | ICD-10-CM | POA: Diagnosis not present

## 2019-09-29 DIAGNOSIS — G8929 Other chronic pain: Secondary | ICD-10-CM

## 2019-09-29 DIAGNOSIS — M5412 Radiculopathy, cervical region: Secondary | ICD-10-CM

## 2019-09-29 DIAGNOSIS — R600 Localized edema: Secondary | ICD-10-CM

## 2019-09-29 NOTE — Therapy (Signed)
[facility] [address] [place], [place], [address] Phone: [phone]   Fax:  [phone]  Physical Therapy Treatment/Progress Note   Patient Details  Name: Brett [name] MRN: [id number] Date of Birth: [birth year] Referring Provider (PT): Dwana Melena PA-C   Encounter Date: 09/29/2019   Progress Note Reporting Period 08/25/2019 to 09/29/2019  See note below for Objective Data and Assessment of Progress/Goals.        PT End of Session - 09/29/19 1401    Visit Number 10    Number of Visits 16    Date for PT Re-Evaluation 10/20/19    Progress Note Due on Visit 10    PT Start Time 2951    PT Stop Time 1424    PT Time Calculation (min) 39 min    Activity Tolerance Patient tolerated treatment well   tolerance fair   Behavior During Therapy WFL for tasks assessed/performed           Past Medical History:  Diagnosis Date  . Allergy    albuterol as needed  . Anal fissure   . Arthritis   . Colon polyps 01/13/11   Colonoscopy  . GERD (gastroesophageal reflux disease)   . Pneumonia   . Pre-diabetes     Past Surgical History:  Procedure Laterality Date  . ANAL FISSURE REPAIR  02/24/11  . CHONDROPLASTY  08/14/2018   Procedure: CHONDROPLASTY;  Surgeon: Leandrew Koyanagi, MD;  Location: [place];  Service: Orthopedics;;  . COLONOSCOPY    . KNEE ARTHROSCOPY WITH MEDIAL MENISECTOMY Left 08/14/2018   Procedure: LEFT KNEE ARTHROSCOPY WITH PARTIAL MEDIAL MENISCECTOMY;  Surgeon: Leandrew Koyanagi, MD;  Location: [place];  Service: Orthopedics;  Laterality: Left;  . KNEE ARTHROSCOPY WITH MEDIAL MENISECTOMY Right 10/18/2018   Procedure: RIGHT KNEE ARTHROSCOPY WITH PARTIAL MEDIAL MENISCECTOMY;  Surgeon: Leandrew Koyanagi, MD;  Location: [place];  Service: Orthopedics;  Laterality: Right;  . TOTAL KNEE ARTHROPLASTY Left 01/24/2019   Procedure: LEFT TOTAL KNEE ARTHROPLASTY;  Surgeon: Leandrew Koyanagi, MD;   Location: [place];  Service: Orthopedics;  Laterality: Left;  . TOTAL KNEE ARTHROPLASTY Right 08/04/2019   Procedure: RIGHT TOTAL KNEE ARTHROPLASTY;  Surgeon: Leandrew Koyanagi, MD;  Location: [place];  Service: Orthopedics;  Laterality: Right;    There were no vitals filed for this visit.   Subjective Assessment - 09/29/19 1357    Subjective Pt. stated night pain still there.  Pt. stated relaxing a lot in last weekend.    Limitations Walking;Standing;Sitting    Patient Stated Goals Reduce pain, improve to independent ambulation.    Currently in Pain? Yes    Pain Score 6     Pain Orientation Right    Pain Descriptors / Indicators Aching;Sharp    Pain Type Surgical pain    Pain Onset More than a month ago    Pain Frequency Constant    Aggravating Factors  constant    Pain Score 6    Pain Descriptors / Indicators Sharp;Shooting    Pain Onset 1 to 4 weeks ago    Pain Frequency Intermittent    Aggravating Factors  insidious off and on    Pain Relieving Factors medicine              [facility] PT Assessment - 09/29/19 0001      Single Leg Stance   Comments Rt SLS 9 seconds      AROM   Right Knee Extension 0  Right Knee Flexion 106      PROM   Right Knee Extension 0      Strength   Right Hip Flexion 4/5    Right Knee Flexion 4+/5    Right Knee Extension 4+/5    Right Ankle Dorsiflexion 5/5    Left Ankle Dorsiflexion 5/5                         [facility] Adult PT Treatment/Exercise - 09/29/19 0001      Neuro Re-ed    Neuro Re-ed Details  foam tandem ambulation fwd 8 ft x 10, retro step foam 20 x bilat,       Neck Exercises: Standing   Other Standing Exercises tband rows, gh ext green 3 x 10    Other Standing Exercises tband er green 20 x bilateral      Knee/Hip Exercises: Aerobic   Nustep Lvl 6 10 mins      Knee/Hip Exercises: Standing   Forward Step Up 2 sets;10 reps;Both;Other (comment)   7 inch                   PT Short Term Goals - 09/29/19  1427      PT SHORT TERM GOAL #1   Title Patient will demonstrate independent use of home exercise program to maintain progress from in clinic treatments.    Baseline 09/22/2019: required cues for techniques at times    Time 2    Period Weeks    Status Achieved    Target Date 09/18/19             PT Long Term Goals - 09/29/19 1425      PT LONG TERM GOAL #1   Title Patient will demonstrate/report pain at worst less than or equal to 2/10 to facilitate minimal limitation in daily activity secondary to pain symptoms.    Time 8    Period Weeks    Status On-going    Target Date 10/20/19      PT LONG TERM GOAL #2   Title Patient will demonstrate independent use of home exercise program to facilitate ability to maintain/progress functional gains from skilled physical therapy services.    Time 8    Period Weeks    Status On-going    Target Date 10/20/19      PT LONG TERM GOAL #3   Title Patient will demonstrate independent ambulation community distances > 300 ft to facilitate community integration at [facility]    Time 8    Period Weeks    Status On-going    Target Date 10/20/19      PT LONG TERM GOAL #4   Title Patient will demonstrate Rt knee AROM 0-110 degrees to facilitate ability to perform transfers, sitting, ambulation, stair navigation s restriction due to mobility.    Time 8    Period Weeks    Status Partially Met    Target Date 10/20/19      PT LONG TERM GOAL #5   Title Patient will demonstrate Rt LE MMT 5/5 throughout to facilitate ability to perform usual standing, walking, stairs at PLOF s limitation due to symptoms.    Time 8    Period Weeks    Status Partially Met    Target Date 10/20/19      PT LONG TERM GOAL #6   Title Pt. will demonstrate bilateral SLS > 15 seconds to facilitate stability in ambulation on even and uneven  surfaces for safety in ambulation.    Time 8    Period Weeks    Status On-going    Target Date 10/20/19      PT LONG TERM GOAL #7    Title report pain in Lt neck shoulder to < 4/10 with centralization for improved function    Status On-going    Target Date 10/20/19      PT LONG TERM GOAL #8   Title improve cervical rotation to at least 60 deg for improved function    Status New                 Plan - 09/29/19 1417    Clinical Impression Statement Pt. has attended 10 visits overall during course of treatment.  See objective data for updated information showing improvements in knee mobility/strength and balance. Pt. seemed to tolerate intervention with mild reduction in fatigue/symptoms today.  Pt. may continue to progress in compliant surface, dynamic stabilty interventions.    Personal Factors and Comorbidities Other    Stability/Clinical Decision Making Stable/Uncomplicated    PT Frequency 2x / week    PT Duration 8 weeks    PT Treatment/Interventions ADLs/Self Care Home Management;Cryotherapy;Electrical Stimulation;Iontophoresis 45m/ml Dexamethasone;Moist Heat;Balance training;Therapeutic exercise;Therapeutic activities;Functional mobility training;Stair training;Ultrasound;Gait training;Neuromuscular re-education;Patient/family education;Manual techniques;Vasopneumatic Device;Taping;Dry needling;Passive range of motion;Spinal Manipulations;Joint Manipulations    PT Next Visit Plan Continued skilled PT services for remaining strength, mobility and movement coordination deficits.    PT Home Exercise Plan [id number]    Consulted and Agree with Plan of Care Patient           Patient will benefit from skilled therapeutic intervention in order to improve the following deficits and impairments:  Abnormal gait, Decreased endurance, Hypomobility, Increased edema, Decreased activity tolerance, Pain, Decreased strength, Decreased balance, Decreased mobility, Difficulty walking, Increased muscle spasms, Impaired perceived functional ability, Decreased range of motion, Decreased coordination, Impaired flexibility, Increased  fascial restricitons  Visit Diagnosis: Chronic pain of right knee  Muscle weakness (generalized)  Difficulty in walking, not elsewhere classified  Stiffness of right knee, not elsewhere classified  Localized edema  Radiculopathy, cervical region     Problem List Patient Active Problem List   Diagnosis Date Noted  . Status post total knee replacement, right 08/04/2019  . Status post total left knee replacement 01/24/2019  . Primary osteoarthritis of right knee 12/17/2018  . Overweight (BMI 25.0-29.9) 08/08/2017  . Medication management 03/23/2014  . Vitamin D deficiency 03/23/2014  . Abnormal glucose 03/23/2014  . Hyperlipemia 10/31/2006  . GERD 10/31/2006    MScot Jun PT, DPT, OCS, ATC 09/29/19  2:28 PM    [facility] [address] [address]Phone: [phone]  Fax:  [phone] Name: Brett BaughMRN: [id number] of Birth: 112-25-1962

## 2019-10-01 ENCOUNTER — Other Ambulatory Visit: Payer: Self-pay

## 2019-10-01 ENCOUNTER — Ambulatory Visit (INDEPENDENT_AMBULATORY_CARE_PROVIDER_SITE_OTHER): Payer: BC Managed Care – PPO | Admitting: Rehabilitative and Restorative Service Providers"

## 2019-10-01 ENCOUNTER — Encounter: Payer: Self-pay | Admitting: Rehabilitative and Restorative Service Providers"

## 2019-10-01 DIAGNOSIS — M25561 Pain in right knee: Secondary | ICD-10-CM

## 2019-10-01 DIAGNOSIS — G8929 Other chronic pain: Secondary | ICD-10-CM

## 2019-10-01 DIAGNOSIS — R262 Difficulty in walking, not elsewhere classified: Secondary | ICD-10-CM | POA: Diagnosis not present

## 2019-10-01 DIAGNOSIS — M25661 Stiffness of right knee, not elsewhere classified: Secondary | ICD-10-CM

## 2019-10-01 DIAGNOSIS — M5412 Radiculopathy, cervical region: Secondary | ICD-10-CM

## 2019-10-01 DIAGNOSIS — R600 Localized edema: Secondary | ICD-10-CM

## 2019-10-01 DIAGNOSIS — M6281 Muscle weakness (generalized): Secondary | ICD-10-CM | POA: Diagnosis not present

## 2019-10-01 NOTE — Therapy (Signed)
[facility] [address] [place], [place], [address] Phone: [phone]   Fax:  [phone]  Physical Therapy Treatment  Patient Details  Name: Brett [name] MRN: [id number] Date of Birth: 05/29/1960 Referring Provider (PT): Dwana Melena PA-C   Encounter Date: 10/01/2019   PT End of Session - 10/01/19 1349    Visit Number 11    Number of Visits 16    Date for PT Re-Evaluation 10/20/19    Progress Note Due on Visit 15    PT Start Time 9935    PT Stop Time 1424    PT Time Calculation (min) 39 min    Activity Tolerance Patient tolerated treatment well   tolerance fair   Behavior During Therapy [facility] for tasks assessed/performed           Past Medical History:  Diagnosis Date  . Allergy    albuterol as needed  . Anal fissure   . Arthritis   . Colon polyps 01/13/11   Colonoscopy  . GERD (gastroesophageal reflux disease)   . Pneumonia   . Pre-diabetes     Past Surgical History:  Procedure Laterality Date  . ANAL FISSURE REPAIR  02/24/11  . CHONDROPLASTY  08/14/2018   Procedure: CHONDROPLASTY;  Surgeon: Leandrew Koyanagi, MD;  Location: [place];  Service: Orthopedics;;  . COLONOSCOPY    . KNEE ARTHROSCOPY WITH MEDIAL MENISECTOMY Left 08/14/2018   Procedure: LEFT KNEE ARTHROSCOPY WITH PARTIAL MEDIAL MENISCECTOMY;  Surgeon: Leandrew Koyanagi, MD;  Location: [place];  Service: Orthopedics;  Laterality: Left;  . KNEE ARTHROSCOPY WITH MEDIAL MENISECTOMY Right 10/18/2018   Procedure: RIGHT KNEE ARTHROSCOPY WITH PARTIAL MEDIAL MENISCECTOMY;  Surgeon: Leandrew Koyanagi, MD;  Location: [place];  Service: Orthopedics;  Laterality: Right;  . TOTAL KNEE ARTHROPLASTY Left 01/24/2019   Procedure: LEFT TOTAL KNEE ARTHROPLASTY;  Surgeon: Leandrew Koyanagi, MD;  Location: [place];  Service: Orthopedics;  Laterality: Left;  . TOTAL KNEE ARTHROPLASTY Right 08/04/2019   Procedure: RIGHT TOTAL KNEE ARTHROPLASTY;  Surgeon:  Leandrew Koyanagi, MD;  Location: [place];  Service: Orthopedics;  Laterality: Right;    There were no vitals filed for this visit.   Subjective Assessment - 10/01/19 1348    Subjective Pt. indicated yesterday was a throbbing day, related to some standing/walking c eye appointment that required stair climbing.    Limitations Walking;Standing;Sitting    Patient Stated Goals Reduce pain, improve to independent ambulation.    Currently in Pain? Yes    Pain Onset More than a month ago    Multiple Pain Sites Yes    Pain Onset 1 to 4 weeks ago              [facility] PT Assessment - 10/01/19 0001      Assessment   Medical Diagnosis Rt TKA, neck pain    Referring Provider (PT) Dwana Melena PA-C    Onset Date/Surgical Date 08/04/19    Hand Dominance Right                         [facility] Adult PT Treatment/Exercise - 10/01/19 0001      Knee/Hip Exercises: Stretches   Gastroc Stretch 5 reps;30 seconds;Both   incline board     Knee/Hip Exercises: Aerobic   Nustep Lvl 6 10 mins      Knee/Hip Exercises: Standing   Lateral Step Up Both;2 sets;10 reps;Hand Hold: 2;Step Height: 6"  blue band TKE   Other Standing Knee Exercises retro step 20x bilateral c UE flexion overhead      Knee/Hip Exercises: Seated   Sit to Sand without UE support;20 reps   18 inch chair, eccentric focus                   PT Short Term Goals - 09/29/19 1427      PT SHORT TERM GOAL #1   Title Patient will demonstrate independent use of home exercise program to maintain progress from in clinic treatments.    Baseline 09/22/2019: required cues for techniques at times    Time 2    Period Weeks    Status Achieved    Target Date 09/18/19             PT Long Term Goals - 09/29/19 1425      PT LONG TERM GOAL #1   Title Patient will demonstrate/report pain at worst less than or equal to 2/10 to facilitate minimal limitation in daily activity secondary to pain symptoms.    Time 8    Period  Weeks    Status On-going    Target Date 10/20/19      PT LONG TERM GOAL #2   Title Patient will demonstrate independent use of home exercise program to facilitate ability to maintain/progress functional gains from skilled physical therapy services.    Time 8    Period Weeks    Status On-going    Target Date 10/20/19      PT LONG TERM GOAL #3   Title Patient will demonstrate independent ambulation community distances > 300 ft to facilitate community integration at [facility].    Time 8    Period Weeks    Status On-going    Target Date 10/20/19      PT LONG TERM GOAL #4   Title Patient will demonstrate Rt knee AROM 0-110 degrees to facilitate ability to perform transfers, sitting, ambulation, stair navigation s restriction due to mobility.    Time 8    Period Weeks    Status Partially Met    Target Date 10/20/19      PT LONG TERM GOAL #5   Title Patient will demonstrate Rt LE MMT 5/5 throughout to facilitate ability to perform usual standing, walking, stairs at PLOF s limitation due to symptoms.    Time 8    Period Weeks    Status Partially Met    Target Date 10/20/19      PT LONG TERM GOAL #6   Title Pt. will demonstrate bilateral SLS > 15 seconds to facilitate stability in ambulation on even and uneven surfaces for safety in ambulation.    Time 8    Period Weeks    Status On-going    Target Date 10/20/19      PT LONG TERM GOAL #7   Title report pain in Lt neck shoulder to < 4/10 with centralization for improved function    Status On-going    Target Date 10/20/19      PT LONG TERM GOAL #8   Title improve cervical rotation to at least 60 deg for improved function    Status New                 Plan - 10/01/19 1411    Clinical Impression Statement Pt. continued to report moderate to elevated symptoms in Rt knee, more insidious on presentation vs. aggravating fx whlie in clinic.  WB loading difficult  c fair control noted.  Continued strength intervention necessary.     Personal Factors and Comorbidities Other    Stability/Clinical Decision Making Stable/Uncomplicated    PT Frequency 2x / week    PT Duration 8 weeks    PT Treatment/Interventions ADLs/Self Care Home Management;Cryotherapy;Electrical Stimulation;Iontophoresis 79m/ml Dexamethasone;Moist Heat;Balance training;Therapeutic exercise;Therapeutic activities;Functional mobility training;Stair training;Ultrasound;Gait training;Neuromuscular re-education;Patient/family education;Manual techniques;Vasopneumatic Device;Taping;Dry needling;Passive range of motion;Spinal Manipulations;Joint Manipulations    PT Next Visit Plan Strengthening, balance.    PT Home Exercise Plan [id number]    Consulted and Agree with Plan of Care Patient           Patient will benefit from skilled therapeutic intervention in order to improve the following deficits and impairments:  Abnormal gait, Decreased endurance, Hypomobility, Increased edema, Decreased activity tolerance, Pain, Decreased strength, Decreased balance, Decreased mobility, Difficulty walking, Increased muscle spasms, Impaired perceived functional ability, Decreased range of motion, Decreased coordination, Impaired flexibility, Increased fascial restricitons  Visit Diagnosis: Chronic pain of right knee  Muscle weakness (generalized)  Difficulty in walking, not elsewhere classified  Stiffness of right knee, not elsewhere classified  Localized edema  Radiculopathy, cervical region     Problem List Patient Active Problem List   Diagnosis Date Noted  . Status post total knee replacement, right 08/04/2019  . Status post total left knee replacement 01/24/2019  . Primary osteoarthritis of right knee 12/17/2018  . Overweight (BMI 25.0-29.9) 08/08/2017  . Medication management 03/23/2014  . Vitamin D deficiency 03/23/2014  . Abnormal glucose 03/23/2014  . Hyperlipemia 10/31/2006  . GERD 10/31/2006    MScot Jun PT, DPT, OCS, ATC 10/01/19  2:13  PM    [facility] [address] [address]Phone: [phone]  Fax:  [phone] Name: ARohn FritschMRN: [id number] of Birth: [birth year]

## 2019-10-03 ENCOUNTER — Encounter: Payer: Self-pay | Admitting: Orthopedic Surgery

## 2019-10-07 ENCOUNTER — Encounter: Payer: BC Managed Care – PPO | Admitting: Rehabilitative and Restorative Service Providers"

## 2019-10-10 ENCOUNTER — Other Ambulatory Visit: Payer: BC Managed Care – PPO

## 2019-10-10 DIAGNOSIS — Z20822 Contact with and (suspected) exposure to covid-19: Secondary | ICD-10-CM

## 2019-10-12 LAB — SARS-COV-2, NAA 2 DAY TAT

## 2019-10-12 LAB — NOVEL CORONAVIRUS, NAA: SARS-CoV-2, NAA: NOT DETECTED

## 2019-10-12 NOTE — Progress Notes (Signed)
FOLLOW UP 3 MONTH  Assessment and Plan:  Hyperlipidemia, unspecified hyperlipidemia type -     Lipid panel check lipids decrease fatty foods increase activity.   Abnormal glucose -     Hemoglobin A1c - given accucheck meter Discussed disease progression and risks Discussed diet/exercise, weight management and risk modification  Overweight (BMI 25.0-29.9) - long discussion about weight loss, diet, and exercise -recommended diet heavy in fruits and veggies and low in animal meats, cheeses, and dairy products  Vitamin D deficiency -     VITAMIN D 25 Hydroxy (Vit-D Deficiency, Fractures)  Medication management -     CBC with Differential/Platelet -     COMPLETE METABOLIC PANEL WITH GFR -     TSH -     Magnesium  Gastroesophageal reflux disease, esophagitis presence not specified Get back on prilosec, avoid NSAIDS  Hypertension, unspecified type - continue medications, DASH diet, exercise and monitor at home. Call if greater than 130/80.  -     Urinalysis, Routine w reflex microscopic -     Microalbumin / creatinine urine ratio -     EKG 12-Lead  Anemia, unspecified type -     Iron,Total/Total Iron Binding Cap -     Vitamin B12  Premature ejaculation -     citalopram (CELEXA) 20 MG tablet; Take 1 tablet (20 mg total) by mouth daily. R  Anxiety -     citalopram (CELEXA) 20 MG tablet; Take 1 tablet (20 mg total) by mouth daily. Patient states with the pandemic, deaths in his family, and his OA pain, he has had more anxiety, will try celexa for premature ejaculation and anxiety  Anemia, unspecified type -     Iron,Total/Total Iron Binding Cap -     Ferritin  Smoker Discussed smoking cessation Not ready to quit at this time Continue to assess readiness Smoking cessation-  instruction/counseling given, counseled patient on the dangers of tobacco use, advised patient to stop smoking, and reviewed strategies to maximize success, patient not ready to quit at this time.    Discussed med's effects and SE's. Screening labs and tests as requested with regular follow-up as recommended.  _______________________________________________________________________________________________________  HPI 59 year old male present for 3 month follow up for HTN, HLD, GERD, abnormal glucose, vitamin D deficiency and weight.  He is a smoker, started back up due to cOVID, goes off and on, started when he was 16, stopped in 30's and then off and on. Last CXR 2019. Discussed cessation.  He lost his mother in law to [place], his mom had COVID and has not recovered well, dad with dementia/DM/Blindenss. Has been stressed, decreased motivation.  Has had COVID vaccines.  S/p left knee arthroscopy (01/24/19) and medal menisectomy with Dr. [name] on 08/14/2018. He continues to have pain in his right knee and has an appointment coming up. He reports that he is doing therapy for this.  His next follow up is 10/28/19.  He reports he continous to feel unsteady.  He reports he continues to have swelling on the right leg along with a compression stocking.  He also does exercises at home. Reports he is using oxycodone Prn and methocarbomol PRN with fair results.  He has been experiencing premature ejaculation and taking citalopram for this.  He reports a mild improvement, few min, but no significant change.  He has not tried any other medication for ED.  Lab Results  Component Value Date   VITAMINB12 700 08/21/2018   BMI is Body mass index  is 27.28 kg/m., he is working on diet and exercise. Wt Readings from Last 3 Encounters:  10/13/19 169 lb (76.7 kg)  08/26/19 166 lb (75.3 kg)  08/19/19 173 lb (78.5 kg)     He is not on cholesterol medication and denies myalgias. His cholesterol is at goal. The cholesterol last visit was:   Lab Results  Component Value Date   CHOL 195 10/13/2019   HDL 57 10/13/2019   LDLCALC 122 (H) 10/13/2019   TRIG 66 10/13/2019   CHOLHDL 3.4 10/13/2019    He has  been working on diet and exercise for prediabetes, sugars have been checking it, he is not on bASA, he is not on ACE/ARB and denies foot ulcerations, nausea, paresthesia of the feet, polydipsia, polyuria, visual disturbances, vomiting and weight loss. Last A1C in the office was:  Lab Results  Component Value Date   HGBA1C 5.9 (H) 10/13/2019    Patient is on Vitamin D supplement, does 5000 IU a day.    Lab Results  Component Value Date   VD25OH 36 10/13/2019    Last PSA was: Lab Results  Component Value Date   PSA 0.8 07/10/2019   Denies BPH symptoms daytime frequency, double voiding, dysuria, hematuria, hesitancy, incontinence, intermittency, nocturia, sensation of incomplete bladder emptying, suprapubic pain, urgency or weak urinary stream.     Current Medications:    Current Outpatient Medications (Cardiovascular):  .  sildenafil (VIAGRA) 100 MG tablet, Take 1 tablet (100 mg total) by mouth as needed for erectile dysfunction.  Current Outpatient Medications (Respiratory):  .  promethazine (PHENERGAN) 12.5 MG tablet, Take 1 tablet (12.5 mg total) by mouth every 6 (six) hours as needed for nausea or vomiting.  Current Outpatient Medications (Analgesics):  .  aspirin EC 81 MG tablet, Take 1 tablet (81 mg total) by mouth 2 (two) times daily. Marland Kitchen  ibuprofen (ADVIL) 800 MG tablet, Take 1 tablet (800 mg total) by mouth 3 (three) times daily with meals as needed. Marland Kitchen  oxyCODONE-acetaminophen (PERCOCET) 5-325 MG tablet, Take 1-2 tablets by mouth 2 (two) times daily as needed for severe pain. Marland Kitchen  HYDROcodone-acetaminophen (NORCO) 5-325 MG tablet, Take 1-2 tablets by mouth daily as needed.   Current Outpatient Medications (Other):  .  blood glucose meter kit and supplies, Dispense based insurance preference. E11.22 CHECK BLOOD SUGAR ONCE DAILY .  Cholecalciferol (VITAMIN D-3) 125 MCG (5000 UT) TABS, Take 5,000 Units by mouth daily. .  citalopram (CELEXA) 20 MG tablet, Take 1 tablet (20 mg  total) by mouth daily. .  Continuous Blood Gluc Receiver (FREESTYLE LIBRE 14 DAY READER) DEVI, 1 Device by Does not apply route every 14 (fourteen) days. Marland Kitchen  docusate sodium (COLACE) 100 MG capsule, Take 1 capsule (100 mg total) by mouth daily as needed. .  gabapentin (NEURONTIN) 600 MG tablet, Take 600 mg by mouth 2 (two) times daily as needed (pain). Marland Kitchen  glucose blood test strip, Test blood sugar once daily .  Melatonin 10 MG TABS, Take 10 mg by mouth at bedtime as needed (sleep). .  methocarbamol (ROBAXIN) 750 MG tablet, Take 1 tablet (750 mg total) by mouth 2 (two) times daily as needed for muscle spasms.  Health Maintenance:  Immunization History  Administered Date(s) Administered  . Influenza Inj Mdck Quad With Preservative 10/18/2017  . Influenza Split 10/13/2019  . Influenza Whole 10/31/2006, 11/03/2010, 11/02/2011  . Influenza,inj,Quad PF,6+ Mos 09/12/2018  . Influenza-Unspecified 01/03/2012  . PFIZER SARS-COV-2 Vaccination 03/25/2019, 04/15/2019  . PPD Test 03/17/2013  .  Pneumococcal-Unspecified 10/13/2009  . Td 01/03/2008  . Tdap 01/03/2011   Health Maintenance  Topic Date Due  . TETANUS/TDAP  01/02/2021  . COLONOSCOPY  01/12/2021  . INFLUENZA VACCINE  Completed  . COVID-19 Vaccine  Completed  . Hepatitis C Screening  Completed  . HIV Screening  Completed    Colonoscopy 10/2011 CT head 2008  Patient Care Team: Unk Pinto, MD as PCP - General (Internal Medicine) Deneise Lever, MD as Consulting Physician (Pulmonary Disease) Wilford Corner, MD as Consulting Physician (Gastroenterology) Zannie Kehr, MD as Referring Physician (Orthopedic Surgery)  Allergies:  Allergies  Allergen Reactions  . Benzodiazepines Hives  . Chantix  [Varenicline] Hives  . Cialis [Tadalafil]     Hives  . Dicyclomine Hives  . Meloxicam     Rash  . Penicillins Hives    TOLERATED ANCEF 08/04/2019.  DID THE REACTION INVOLVE: Swelling of the face/tongue/throat, SOB, or low  BP? Unknown Sudden or severe rash/hives, skin peeling, or the inside of the mouth or nose? Unknown Did it require medical treatment? n  When did it last happen?Over 5 years ago. If all above answers are "NO", may proceed with cephalosporin use.   Oley Balm [Nabumetone]     Hives    Medical History:  Past Medical History:  Diagnosis Date  . Allergy    albuterol as needed  . Anal fissure   . Arthritis   . Colon polyps 01/13/11   Colonoscopy  . GERD (gastroesophageal reflux disease)   . Pneumonia   . Pre-diabetes     Surgical History:  Past Surgical History:  Procedure Laterality Date  . ANAL FISSURE REPAIR  02/24/11  . CHONDROPLASTY  08/14/2018   Procedure: CHONDROPLASTY;  Surgeon: Leandrew Koyanagi, MD;  Location: [place];  Service: Orthopedics;;  . COLONOSCOPY    . KNEE ARTHROSCOPY WITH MEDIAL MENISECTOMY Left 08/14/2018   Procedure: LEFT KNEE ARTHROSCOPY WITH PARTIAL MEDIAL MENISCECTOMY;  Surgeon: Leandrew Koyanagi, MD;  Location: [place];  Service: Orthopedics;  Laterality: Left;  . KNEE ARTHROSCOPY WITH MEDIAL MENISECTOMY Right 10/18/2018   Procedure: RIGHT KNEE ARTHROSCOPY WITH PARTIAL MEDIAL MENISCECTOMY;  Surgeon: Leandrew Koyanagi, MD;  Location: [place];  Service: Orthopedics;  Laterality: Right;  . TOTAL KNEE ARTHROPLASTY Left 01/24/2019   Procedure: LEFT TOTAL KNEE ARTHROPLASTY;  Surgeon: Leandrew Koyanagi, MD;  Location: [place];  Service: Orthopedics;  Laterality: Left;  . TOTAL KNEE ARTHROPLASTY Right 08/04/2019   Procedure: RIGHT TOTAL KNEE ARTHROPLASTY;  Surgeon: Leandrew Koyanagi, MD;  Location: [place];  Service: Orthopedics;  Laterality: Right;    Family History:  Family History  Problem Relation Age of Onset  . Rheum arthritis Mother   . Heart disease Mother   . Diabetes Mother   . Arthritis Mother   . Diabetes Father   . Blindness Father        from DM?  Marland Kitchen Allergies Daughter   . Allergies Sister   . Asthma Daughter      Social History:   He  reports that he quit smoking about 13 months ago. His smoking use included cigarettes. He has a 6.75 pack-year smoking history. He has never used smokeless tobacco. He reports current alcohol use. He reports that he does not use drugs.  Review of Systems:  Review of Systems  Constitutional: Negative for chills, fever and weight loss.  HENT: Negative for congestion, ear pain, hearing loss, sinus pain and sore throat.  Eyes: Negative for double vision, photophobia and pain.  Respiratory: Negative for cough and shortness of breath.   Cardiovascular: Negative for chest pain, palpitations, orthopnea, claudication, leg swelling and PND.  Gastrointestinal: Negative for abdominal pain, blood in stool, constipation, diarrhea, melena, nausea and vomiting.  Genitourinary: Negative for dysuria, flank pain, frequency, hematuria and urgency.  Musculoskeletal: Positive for joint pain. Negative for falls, myalgias and neck pain.  Skin: Negative for rash.  Neurological: Negative for dizziness, tingling, sensory change, speech change, focal weakness and headaches.  Psychiatric/Behavioral: Negative for depression, substance abuse and suicidal ideas. The patient is nervous/anxious. The patient does not have insomnia.     Physical Exam: Estimated body mass index is 27.28 kg/m as calculated from the following:   Height as of 08/19/19: '5\' 6"'  (1.676 m).   Weight as of this encounter: 169 lb (76.7 kg). BP 128/74   Pulse (!) 55   Temp 97.6 F (36.4 C)   Wt 169 lb (76.7 kg)   SpO2 98%   BMI 27.28 kg/m   General Appearance: Well nourished, in no apparent distress.  Eyes: PERRLA, EOMs, conjunctiva no swelling or erythema ENT/Mouth: Ear canals clear bilaterally with no erythema, swelling, discharge.  TMs normal bilaterally with no erythema, bulging, or retractions.  Oropharynx clear and moist with no exudate, swelling, or erythema.  Dentition normal.   Neck: Right cervical adenopathy  near mastoid/year.  Supple, thyroid normal. No bruits, JVD, cervical adenopathy Respiratory: Respiratory effort normal, BS equal bilaterally without rales, rhonchi, wheezing or stridor.  Cardio: RRR without murmurs, rubs or gallops. Brisk peripheral pulses without edema.  Chest: symmetric, with normal excursions Abdomen: Soft, nontender, no guarding, rebound, hernias, masses, or organomegaly. Musculoskeletal: Full ROM all peripheral extremities,5/5 strength, and antalgic gait. Skin: Warm, dry without rashes, lesions, ecchymosis. Neuro: A&Ox3, Cranial nerves intact, reflexes equal bilaterally. Normal muscle tone, no cerebellar symptoms. Sensation intact.  Psych: Normal affect, Insight and Judgment appropriate.    Garnet Sierras, Laqueta Jean, DNP [facility] 10/13/2019  4:55PM

## 2019-10-13 ENCOUNTER — Other Ambulatory Visit: Payer: Self-pay

## 2019-10-13 ENCOUNTER — Encounter: Payer: Self-pay | Admitting: Adult Health Nurse Practitioner

## 2019-10-13 ENCOUNTER — Ambulatory Visit (INDEPENDENT_AMBULATORY_CARE_PROVIDER_SITE_OTHER): Payer: BC Managed Care – PPO | Admitting: Adult Health Nurse Practitioner

## 2019-10-13 VITALS — BP 128/74 | HR 55 | Temp 97.6°F | Wt 169.0 lb

## 2019-10-13 DIAGNOSIS — D649 Anemia, unspecified: Secondary | ICD-10-CM

## 2019-10-13 DIAGNOSIS — F419 Anxiety disorder, unspecified: Secondary | ICD-10-CM

## 2019-10-13 DIAGNOSIS — R7309 Other abnormal glucose: Secondary | ICD-10-CM | POA: Diagnosis not present

## 2019-10-13 DIAGNOSIS — I10 Essential (primary) hypertension: Secondary | ICD-10-CM

## 2019-10-13 DIAGNOSIS — E785 Hyperlipidemia, unspecified: Secondary | ICD-10-CM

## 2019-10-13 DIAGNOSIS — F17200 Nicotine dependence, unspecified, uncomplicated: Secondary | ICD-10-CM

## 2019-10-13 DIAGNOSIS — M1712 Unilateral primary osteoarthritis, left knee: Secondary | ICD-10-CM

## 2019-10-13 DIAGNOSIS — E663 Overweight: Secondary | ICD-10-CM

## 2019-10-13 DIAGNOSIS — Z79899 Other long term (current) drug therapy: Secondary | ICD-10-CM | POA: Diagnosis not present

## 2019-10-13 DIAGNOSIS — M792 Neuralgia and neuritis, unspecified: Secondary | ICD-10-CM

## 2019-10-13 DIAGNOSIS — E559 Vitamin D deficiency, unspecified: Secondary | ICD-10-CM

## 2019-10-13 DIAGNOSIS — Z23 Encounter for immunization: Secondary | ICD-10-CM

## 2019-10-13 DIAGNOSIS — F524 Premature ejaculation: Secondary | ICD-10-CM

## 2019-10-13 DIAGNOSIS — K219 Gastro-esophageal reflux disease without esophagitis: Secondary | ICD-10-CM

## 2019-10-13 DIAGNOSIS — Z96651 Presence of right artificial knee joint: Secondary | ICD-10-CM

## 2019-10-13 DIAGNOSIS — Z96652 Presence of left artificial knee joint: Secondary | ICD-10-CM

## 2019-10-13 DIAGNOSIS — R059 Cough, unspecified: Secondary | ICD-10-CM

## 2019-10-13 MED ORDER — SILDENAFIL CITRATE 100 MG PO TABS: 100.0000 mg | ORAL_TABLET | ORAL | 1 refills | Status: DC | PRN

## 2019-10-14 ENCOUNTER — Telehealth: Payer: Self-pay | Admitting: Orthopaedic Surgery

## 2019-10-14 LAB — COMPLETE METABOLIC PANEL WITH GFR
AG Ratio: 1.5 (calc) (ref 1.0–2.5)
ALT: 20 U/L (ref 9–46)
AST: 21 U/L (ref 10–35)
Albumin: 4.3 g/dL (ref 3.6–5.1)
Alkaline phosphatase (APISO): 107 U/L (ref 35–144)
BUN: 18 mg/dL (ref 7–25)
CO2: 27 mmol/L (ref 20–32)
Calcium: 9.5 mg/dL (ref 8.6–10.3)
Chloride: 108 mmol/L (ref 98–110)
Creat: 1.03 mg/dL (ref 0.70–1.33)
GFR, Est African American: 92 mL/min/{1.73_m2} (ref >=60)
GFR, Est Non African American: 80 mL/min/{1.73_m2} (ref >=60)
Globulin: 2.8 g/dL (calc) (ref 1.9–3.7)
Glucose, Bld: 82 mg/dL (ref 65–99)
Potassium: 4.1 mmol/L (ref 3.5–5.3)
Sodium: 143 mmol/L (ref 135–146)
Total Bilirubin: 0.4 mg/dL (ref 0.2–1.2)
Total Protein: 7.1 g/dL (ref 6.1–8.1)

## 2019-10-14 LAB — HEMOGLOBIN A1C
Hgb A1c MFr Bld: 5.9 % of total Hgb — ABNORMAL HIGH (ref <5.7)
Mean Plasma Glucose: 123 (calc)
eAG (mmol/L): 6.8 (calc)

## 2019-10-14 LAB — CBC WITH DIFFERENTIAL/PLATELET
Absolute Monocytes: 667 cells/uL (ref 200–950)
Basophils Absolute: 53 cells/uL (ref 0–200)
Basophils Relative: 0.8 %
Eosinophils Absolute: 337 cells/uL (ref 15–500)
Eosinophils Relative: 5.1 %
HCT: 37.7 % — ABNORMAL LOW (ref 38.5–50.0)
Hemoglobin: 11.9 g/dL — ABNORMAL LOW (ref 13.2–17.1)
Lymphs Abs: 2330 cells/uL (ref 850–3900)
MCH: 26.1 pg — ABNORMAL LOW (ref 27.0–33.0)
MCHC: 31.6 g/dL — ABNORMAL LOW (ref 32.0–36.0)
MCV: 82.7 fL (ref 80.0–100.0)
MPV: 10.7 fL (ref 7.5–12.5)
Monocytes Relative: 10.1 %
Neutro Abs: 3214 cells/uL (ref 1500–7800)
Neutrophils Relative %: 48.7 %
Platelets: 230 10*3/uL (ref 140–400)
RBC: 4.56 10*6/uL (ref 4.20–5.80)
RDW: 13.8 % (ref 11.0–15.0)
Total Lymphocyte: 35.3 %
WBC: 6.6 10*3/uL (ref 3.8–10.8)

## 2019-10-14 LAB — LIPID PANEL
Cholesterol: 195 mg/dL (ref <200)
HDL: 57 mg/dL (ref >=40)
LDL Cholesterol (Calc): 122 mg/dL (calc) — ABNORMAL HIGH
Non-HDL Cholesterol (Calc): 138 mg/dL (calc) — ABNORMAL HIGH (ref <130)
Total CHOL/HDL Ratio: 3.4 (calc) (ref <5.0)
Triglycerides: 66 mg/dL (ref <150)

## 2019-10-14 LAB — TSH: TSH: 1.74 mIU/L (ref 0.40–4.50)

## 2019-10-14 LAB — MAGNESIUM: Magnesium: 1.9 mg/dL (ref 1.5–2.5)

## 2019-10-14 LAB — VITAMIN D 25 HYDROXY (VIT D DEFICIENCY, FRACTURES): Vit D, 25-Hydroxy: 36 ng/mL (ref 30–100)

## 2019-10-14 NOTE — Telephone Encounter (Signed)
Pt called stating he needs a refill of his hydrocodone sent to the pharmacy and the pt would like a CB to let him know when its been sent in  [phone]

## 2019-10-15 ENCOUNTER — Other Ambulatory Visit: Payer: Self-pay | Admitting: Physician Assistant

## 2019-10-15 ENCOUNTER — Other Ambulatory Visit: Payer: Self-pay

## 2019-10-15 ENCOUNTER — Ambulatory Visit (INDEPENDENT_AMBULATORY_CARE_PROVIDER_SITE_OTHER): Payer: BC Managed Care – PPO | Admitting: Physical Therapy

## 2019-10-15 DIAGNOSIS — G8929 Other chronic pain: Secondary | ICD-10-CM

## 2019-10-15 DIAGNOSIS — M25661 Stiffness of right knee, not elsewhere classified: Secondary | ICD-10-CM | POA: Diagnosis not present

## 2019-10-15 DIAGNOSIS — M25561 Pain in right knee: Secondary | ICD-10-CM

## 2019-10-15 DIAGNOSIS — R262 Difficulty in walking, not elsewhere classified: Secondary | ICD-10-CM

## 2019-10-15 DIAGNOSIS — M5412 Radiculopathy, cervical region: Secondary | ICD-10-CM

## 2019-10-15 DIAGNOSIS — M6281 Muscle weakness (generalized): Secondary | ICD-10-CM

## 2019-10-15 DIAGNOSIS — R600 Localized edema: Secondary | ICD-10-CM

## 2019-10-15 MED ORDER — HYDROCODONE-ACETAMINOPHEN 5-325 MG PO TABS: 1.0000 | ORAL_TABLET | Freq: Every day | ORAL | 0 refills | Status: DC | PRN

## 2019-10-15 NOTE — Telephone Encounter (Signed)
Sent in

## 2019-10-15 NOTE — Therapy (Addendum)
[facility] [address] [place], [place], [address] Phone: [phone]   Fax:  [phone]  Physical Therapy Treatment/Discharge  Patient Details  Name: Brett [name] MRN: [id number] Date of Birth: 1960/01/18 Referring Provider (PT): Dwana Melena PA-C   Encounter Date: 10/15/2019   PT End of Session - 10/15/19 1438    Visit Number 12    Number of Visits 16    Date for PT Re-Evaluation 10/20/19    Progress Note Due on Visit 15    PT Start Time 1157    PT Stop Time 1440   last 10 min on heat   PT Time Calculation (min) 55 min    Activity Tolerance Patient tolerated treatment well   tolerance fair   Behavior During Therapy [facility] for tasks assessed/performed           Past Medical History:  Diagnosis Date  . Allergy    albuterol as needed  . Anal fissure   . Arthritis   . Colon polyps 01/13/11   Colonoscopy  . GERD (gastroesophageal reflux disease)   . Pneumonia   . Pre-diabetes     Past Surgical History:  Procedure Laterality Date  . ANAL FISSURE REPAIR  02/24/11  . CHONDROPLASTY  08/14/2018   Procedure: CHONDROPLASTY;  Surgeon: Leandrew Koyanagi, MD;  Location: [place];  Service: Orthopedics;;  . COLONOSCOPY    . KNEE ARTHROSCOPY WITH MEDIAL MENISECTOMY Left 08/14/2018   Procedure: LEFT KNEE ARTHROSCOPY WITH PARTIAL MEDIAL MENISCECTOMY;  Surgeon: Leandrew Koyanagi, MD;  Location: [place];  Service: Orthopedics;  Laterality: Left;  . KNEE ARTHROSCOPY WITH MEDIAL MENISECTOMY Right 10/18/2018   Procedure: RIGHT KNEE ARTHROSCOPY WITH PARTIAL MEDIAL MENISCECTOMY;  Surgeon: Leandrew Koyanagi, MD;  Location: [place];  Service: Orthopedics;  Laterality: Right;  . TOTAL KNEE ARTHROPLASTY Left 01/24/2019   Procedure: LEFT TOTAL KNEE ARTHROPLASTY;  Surgeon: Leandrew Koyanagi, MD;  Location: [place];  Service: Orthopedics;  Laterality: Left;  . TOTAL KNEE ARTHROPLASTY Right 08/04/2019   Procedure: RIGHT  TOTAL KNEE ARTHROPLASTY;  Surgeon: Leandrew Koyanagi, MD;  Location: [place];  Service: Orthopedics;  Laterality: Right;    There were no vitals filed for this visit.   Subjective Assessment - 10/15/19 1437    Subjective Relays about 6-7 out of 10 knee pain today and that his left knee is also starting to act up    Limitations Walking;Standing;Sitting    Patient Stated Goals Reduce pain, improve to independent ambulation.    Pain Onset More than a month ago    Pain Onset 1 to 4 weeks ago                             [facility] Adult PT Treatment/Exercise - 10/15/19 0001      Neuro Re-ed    Neuro Re-ed Details  foam tandem ambulation fwd 8 ft x 10, retro step foam 20 x bilat,       Knee/Hip Exercises: Stretches   Knee: Self-Stretch Limitations seated tailgate heelslides with self OP 10 sec X 10 reps      Knee/Hip Exercises: Aerobic   Nustep Lvl 6 10 mins      Knee/Hip Exercises: Machines for Strengthening   Cybex Leg Press 37 lbs SL 3 x 10, performed bilateral      Knee/Hip Exercises: Standing   Lateral Step Up Both;10 reps;Hand Hold: 2;Step Height: 6"  Forward Step Up Right;15 reps;Hand Hold: 0;Step Height: 6"      Knee/Hip Exercises: Seated   Sit to Sand without UE support;20 reps   21 inch                   PT Short Term Goals - 09/29/19 1427      PT SHORT TERM GOAL #1   Title Patient will demonstrate independent use of home exercise program to maintain progress from in clinic treatments.    Baseline 09/22/2019: required cues for techniques at times    Time 2    Period Weeks    Status Achieved    Target Date 09/18/19             PT Long Term Goals - 09/29/19 1425      PT LONG TERM GOAL #1   Title Patient will demonstrate/report pain at worst less than or equal to 2/10 to facilitate minimal limitation in daily activity secondary to pain symptoms.    Time 8    Period Weeks    Status On-going    Target Date 10/20/19      PT LONG TERM GOAL  #2   Title Patient will demonstrate independent use of home exercise program to facilitate ability to maintain/progress functional gains from skilled physical therapy services.    Time 8    Period Weeks    Status On-going    Target Date 10/20/19      PT LONG TERM GOAL #3   Title Patient will demonstrate independent ambulation community distances > 300 ft to facilitate community integration at [facility].    Time 8    Period Weeks    Status On-going    Target Date 10/20/19      PT LONG TERM GOAL #4   Title Patient will demonstrate Rt knee AROM 0-110 degrees to facilitate ability to perform transfers, sitting, ambulation, stair navigation s restriction due to mobility.    Time 8    Period Weeks    Status Partially Met    Target Date 10/20/19      PT LONG TERM GOAL #5   Title Patient will demonstrate Rt LE MMT 5/5 throughout to facilitate ability to perform usual standing, walking, stairs at PLOF s limitation due to symptoms.    Time 8    Period Weeks    Status Partially Met    Target Date 10/20/19      PT LONG TERM GOAL #6   Title Pt. will demonstrate bilateral SLS > 15 seconds to facilitate stability in ambulation on even and uneven surfaces for safety in ambulation.    Time 8    Period Weeks    Status On-going    Target Date 10/20/19      PT LONG TERM GOAL #7   Title report pain in Lt neck shoulder to < 4/10 with centralization for improved function    Status On-going    Target Date 10/20/19      PT LONG TERM GOAL #8   Title improve cervical rotation to at least 60 deg for improved function    Status New                 Plan - 10/15/19 1439    Clinical Impression Statement Continued to work on knee strenght, ROM, and balance as tolerated, Continues to be in significant pain.    Personal Factors and Comorbidities Other    Stability/Clinical Decision Making Stable/Uncomplicated    PT  Frequency 2x / week    PT Duration 8 weeks    PT Treatment/Interventions ADLs/Self  Care Home Management;Cryotherapy;Electrical Stimulation;Iontophoresis 74m/ml Dexamethasone;Moist Heat;Balance training;Therapeutic exercise;Therapeutic activities;Functional mobility training;Stair training;Ultrasound;Gait training;Neuromuscular re-education;Patient/family education;Manual techniques;Vasopneumatic Device;Taping;Dry needling;Passive range of motion;Spinal Manipulations;Joint Manipulations    PT Next Visit Plan Strengthening, balance.    PT Home Exercise Plan [id number]    Consulted and Agree with Plan of Care Patient           Patient will benefit from skilled therapeutic intervention in order to improve the following deficits and impairments:  Abnormal gait, Decreased endurance, Hypomobility, Increased edema, Decreased activity tolerance, Pain, Decreased strength, Decreased balance, Decreased mobility, Difficulty walking, Increased muscle spasms, Impaired perceived functional ability, Decreased range of motion, Decreased coordination, Impaired flexibility, Increased fascial restricitons  Visit Diagnosis: Chronic pain of right knee  Muscle weakness (generalized)  Difficulty in walking, not elsewhere classified  Stiffness of right knee, not elsewhere classified  Localized edema  Radiculopathy, cervical region     Problem List Patient Active Problem List   Diagnosis Date Noted  . Status post total knee replacement, right 08/04/2019  . Status post total left knee replacement 01/24/2019  . Primary osteoarthritis of right knee 12/17/2018  . Overweight (BMI 25.0-29.9) 08/08/2017  . Medication management 03/23/2014  . Vitamin D deficiency 03/23/2014  . Abnormal glucose 03/23/2014  . Hyperlipemia 10/31/2006  . GERD 10/31/2006    BSilvestre Mesi10/13/2021, 2:40 PM   PHYSICAL THERAPY DISCHARGE SUMMARY  Visits from Start of Care:12  Current functional level related to goals / functional outcomes: See note   Remaining deficits: See note   Education /  Equipment: HEP Plan: Patient agrees to discharge.  Patient goals were partially met. Patient is being discharged due to not returning since the last visit.  ?????    MScot Jun PT, DPT, OCS, ATC 12/10/19  1:54 PM     [facility] [address]GT[place] [address]Phone: [phone]  Fax:  [phone] Name: Brett FontanillaMRN: [id number] of Birth: [birth year]

## 2019-10-15 NOTE — Telephone Encounter (Signed)
Patient aware.

## 2019-10-27 ENCOUNTER — Ambulatory Visit: Payer: BC Managed Care – PPO

## 2019-10-28 ENCOUNTER — Ambulatory Visit (INDEPENDENT_AMBULATORY_CARE_PROVIDER_SITE_OTHER): Payer: BC Managed Care – PPO | Admitting: Physician Assistant

## 2019-10-28 ENCOUNTER — Other Ambulatory Visit: Payer: Self-pay

## 2019-10-28 ENCOUNTER — Encounter: Payer: Self-pay | Admitting: Orthopaedic Surgery

## 2019-10-28 ENCOUNTER — Ambulatory Visit (INDEPENDENT_AMBULATORY_CARE_PROVIDER_SITE_OTHER): Payer: BC Managed Care – PPO

## 2019-10-28 DIAGNOSIS — Z96651 Presence of right artificial knee joint: Secondary | ICD-10-CM

## 2019-10-28 NOTE — Progress Notes (Signed)
Post-Op Visit Note   Patient: Brett [name]           Date of Birth: 10-15-1960           MRN: [id number] Visit Date: 10/28/2019 PCP: Lucky Cowboy, MD   Assessment & Plan:  Chief Complaint:  Chief Complaint  Patient presents with  . Right Knee - Pain  . Left Knee - Pain   Visit Diagnoses:  1. Hx of total knee replacement, right     Plan: Patient is a 59 year old gentleman who comes in today 3 months out right total knee replacement.  He still admits to a fair amount of pain and weakness.  He is currently in physical therapy but notes he has not been in the past week.  He uses a cane ambulation.  Examination of his right knee reveals a fully healed surgical scar without complication.  Range of motion 0 to 110 degrees.  3 out of 5 with resisted straight leg raise.  Valgus varus stress.  At this point, I would like for him to continue working in outpatient physical therapy as well as at home to regain more strength.  He works as a Financial risk analyst and does not feel as though he can safely return to work.  He will follow up with [place] in 6 weeks for recheck.  Anticipate release back to work at that point.  Follow-Up Instructions: Return in about 6 weeks (around 12/09/2019).   Orders:  Orders Placed This Encounter  Procedures  . XR Knee 1-2 Views Right   No orders of the defined types were placed in this encounter.   Imaging: XR Knee 1-2 Views Right  Result Date: 10/28/2019 Well-seated prosthesis without complication   PMFS History: Patient Active Problem List   Diagnosis Date Noted  . Status post total knee replacement, right 08/04/2019  . Status post total left knee replacement 01/24/2019  . Primary osteoarthritis of right knee 12/17/2018  . Overweight (BMI 25.0-29.9) 08/08/2017  . Medication management 03/23/2014  . Vitamin D deficiency 03/23/2014  . Abnormal glucose 03/23/2014  . Hyperlipemia 10/31/2006  . GERD 10/31/2006   Past Medical History:  Diagnosis Date  .  Allergy    albuterol as needed  . Anal fissure   . Arthritis   . Colon polyps 01/13/11   Colonoscopy  . GERD (gastroesophageal reflux disease)   . Pneumonia   . Pre-diabetes     Family History  Problem Relation Age of Onset  . Rheum arthritis Mother   . Heart disease Mother   . Diabetes Mother   . Arthritis Mother   . Diabetes Father   . Blindness Father        from DM?  Marland Kitchen Allergies Daughter   . Allergies Sister   . Asthma Daughter     Past Surgical History:  Procedure Laterality Date  . ANAL FISSURE REPAIR  02/24/11  . CHONDROPLASTY  08/14/2018   Procedure: CHONDROPLASTY;  Surgeon: Tarry Kos, MD;  Location: [facility];  Service: Orthopedics;;  . COLONOSCOPY    . KNEE ARTHROSCOPY WITH MEDIAL MENISECTOMY Left 08/14/2018   Procedure: LEFT KNEE ARTHROSCOPY WITH PARTIAL MEDIAL MENISCECTOMY;  Surgeon: Tarry Kos, MD;  Location: [facility];  Service: Orthopedics;  Laterality: Left;  . KNEE ARTHROSCOPY WITH MEDIAL MENISECTOMY Right 10/18/2018   Procedure: RIGHT KNEE ARTHROSCOPY WITH PARTIAL MEDIAL MENISCECTOMY;  Surgeon: Tarry Kos, MD;  Location: [facility];  Service: Orthopedics;  Laterality: Right;  .  TOTAL KNEE ARTHROPLASTY Left 01/24/2019   Procedure: LEFT TOTAL KNEE ARTHROPLASTY;  Surgeon: Tarry Kos, MD;  Location: [facility];  Service: Orthopedics;  Laterality: Left;  . TOTAL KNEE ARTHROPLASTY Right 08/04/2019   Procedure: RIGHT TOTAL KNEE ARTHROPLASTY;  Surgeon: Tarry Kos, MD;  Location: [facility];  Service: Orthopedics;  Laterality: Right;   Social History   Occupational History    Employer: [employer]  Tobacco Use  . Smoking status: Former Smoker    Packs/day: 0.25    Years: 27.00    Pack years: 6.75    Types: Cigarettes    Quit date: 08/20/2018    Years since quitting: 1.1  . Smokeless tobacco: Never Used  Vaping Use  . Vaping Use: Never used  Substance and Sexual Activity  . Alcohol use: Yes    Comment: socially    . Drug use: No  . Sexual activity: Not on file

## 2019-11-03 ENCOUNTER — Telehealth: Payer: Self-pay

## 2019-11-03 NOTE — Telephone Encounter (Signed)
Patient called in for refill on pain meds

## 2019-11-04 ENCOUNTER — Other Ambulatory Visit: Payer: Self-pay | Admitting: Physician Assistant

## 2019-11-04 MED ORDER — HYDROCODONE-ACETAMINOPHEN 5-325 MG PO TABS: 1.0000 | ORAL_TABLET | Freq: Every day | ORAL | 0 refills | Status: DC | PRN

## 2019-11-04 NOTE — Telephone Encounter (Signed)
Patient aware.

## 2019-11-04 NOTE — Telephone Encounter (Signed)
I just sent in norco.  Please make him aware that this is last narcotic we can call in

## 2019-11-10 ENCOUNTER — Ambulatory Visit: Payer: BC Managed Care – PPO

## 2019-11-11 ENCOUNTER — Encounter: Payer: BC Managed Care – PPO | Admitting: Physical Therapy

## 2019-11-13 ENCOUNTER — Encounter: Payer: BC Managed Care – PPO | Admitting: Physical Therapy

## 2019-11-14 ENCOUNTER — Other Ambulatory Visit: Payer: BC Managed Care – PPO

## 2019-11-17 ENCOUNTER — Other Ambulatory Visit: Payer: BC Managed Care – PPO

## 2019-11-17 DIAGNOSIS — Z20822 Contact with and (suspected) exposure to covid-19: Secondary | ICD-10-CM

## 2019-11-18 ENCOUNTER — Encounter: Payer: BC Managed Care – PPO | Admitting: Physical Therapy

## 2019-11-18 LAB — SARS-COV-2, NAA 2 DAY TAT

## 2019-11-18 LAB — NOVEL CORONAVIRUS, NAA: SARS-CoV-2, NAA: NOT DETECTED

## 2019-11-20 ENCOUNTER — Encounter: Payer: BC Managed Care – PPO | Admitting: Physical Therapy

## 2019-11-25 ENCOUNTER — Encounter: Payer: BC Managed Care – PPO | Admitting: Physical Therapy

## 2019-11-25 ENCOUNTER — Ambulatory Visit: Payer: BC Managed Care – PPO | Attending: Internal Medicine

## 2019-11-25 DIAGNOSIS — Z23 Encounter for immunization: Secondary | ICD-10-CM

## 2019-11-25 NOTE — Progress Notes (Signed)
   Covid-19 Vaccination Clinic  Name:  Brett [name]    MRN: [id number] DOB: 1960/02/25  11/25/2019  Mr. [name] was observed post Covid-19 immunization for 15 minutes without incident. He was provided with Vaccine Information Sheet and instruction to access the V-Safe system.   Mr. [name] was instructed to call 911 with any severe reactions post vaccine: Marland Kitchen Difficulty breathing  . Swelling of face and throat  . A fast heartbeat  . A bad rash all over body  . Dizziness and weakness   Immunizations Administered    Name Date Dose VIS Date Route   Pfizer COVID-19 Vaccine 11/25/2019  1:34 PM 0.3 mL 10/22/2019 Intramuscular   Manufacturer: [employer], [employer]   Lot: [id number]   NDC: [address]-5

## 2019-12-01 ENCOUNTER — Encounter: Payer: BC Managed Care – PPO | Admitting: Physical Therapy

## 2019-12-03 ENCOUNTER — Encounter: Payer: BC Managed Care – PPO | Admitting: Physical Therapy

## 2019-12-09 ENCOUNTER — Ambulatory Visit (INDEPENDENT_AMBULATORY_CARE_PROVIDER_SITE_OTHER): Payer: BC Managed Care – PPO | Admitting: Orthopaedic Surgery

## 2019-12-09 ENCOUNTER — Encounter: Payer: BC Managed Care – PPO | Admitting: Physical Therapy

## 2019-12-09 ENCOUNTER — Encounter: Payer: Self-pay | Admitting: Orthopaedic Surgery

## 2019-12-09 DIAGNOSIS — Z96651 Presence of right artificial knee joint: Secondary | ICD-10-CM | POA: Diagnosis not present

## 2019-12-09 NOTE — Progress Notes (Signed)
Post-Op Visit Note   Patient: Brett [name]           Date of Birth: 27-Jan-1960           MRN: [id number] Visit Date: 12/09/2019 PCP: Lucky Cowboy, MD   Assessment & Plan:  Chief Complaint:  Chief Complaint  Patient presents with  . Right Knee - Pain   Visit Diagnoses:  1. Status post total right knee replacement     Plan:   Council is 4 months status post right total knee replacement and 11 months status post left total knee replacement.  He has been doing well overall.  He still has some swelling and inflammation.  He is taking over-the-counter medications and muscle relaxers.  He continues to be out of work.  He has stopped physical therapy due to the cost.  No real complaints today.  Surgical scars are both fully healed.  He has excellent range of motion.  Stable to varus valgus.  No joint effusion.  From my standpoint he is doing very well.  I would like to see him back in another 3 months with two-view x-rays of bilateral knees.  Dental prophylaxis reinforced.  Work note provided.  Handicap renewed for 6 months.  Follow-Up Instructions: Return in about 3 months (around 03/08/2020).   Orders:  No orders of the defined types were placed in this encounter.  No orders of the defined types were placed in this encounter.   Imaging: No results found.  PMFS History: Patient Active Problem List   Diagnosis Date Noted  . Status post total knee replacement, right 08/04/2019  . Status post total left knee replacement 01/24/2019  . Primary osteoarthritis of right knee 12/17/2018  . Overweight (BMI 25.0-29.9) 08/08/2017  . Medication management 03/23/2014  . Vitamin D deficiency 03/23/2014  . Abnormal glucose 03/23/2014  . Hyperlipemia 10/31/2006  . GERD 10/31/2006   Past Medical History:  Diagnosis Date  . Allergy    albuterol as needed  . Anal fissure   . Arthritis   . Colon polyps 01/13/11   Colonoscopy  . GERD (gastroesophageal reflux disease)   .  Pneumonia   . Pre-diabetes     Family History  Problem Relation Age of Onset  . Rheum arthritis Mother   . Heart disease Mother   . Diabetes Mother   . Arthritis Mother   . Diabetes Father   . Blindness Father        from DM?  Marland Kitchen Allergies Daughter   . Allergies Sister   . Asthma Daughter     Past Surgical History:  Procedure Laterality Date  . ANAL FISSURE REPAIR  02/24/11  . CHONDROPLASTY  08/14/2018   Procedure: CHONDROPLASTY;  Surgeon: Tarry Kos, MD;  Location: [facility];  Service: Orthopedics;;  . COLONOSCOPY    . KNEE ARTHROSCOPY WITH MEDIAL MENISECTOMY Left 08/14/2018   Procedure: LEFT KNEE ARTHROSCOPY WITH PARTIAL MEDIAL MENISCECTOMY;  Surgeon: Tarry Kos, MD;  Location: [facility];  Service: Orthopedics;  Laterality: Left;  . KNEE ARTHROSCOPY WITH MEDIAL MENISECTOMY Right 10/18/2018   Procedure: RIGHT KNEE ARTHROSCOPY WITH PARTIAL MEDIAL MENISCECTOMY;  Surgeon: Tarry Kos, MD;  Location: [facility];  Service: Orthopedics;  Laterality: Right;  . TOTAL KNEE ARTHROPLASTY Left 01/24/2019   Procedure: LEFT TOTAL KNEE ARTHROPLASTY;  Surgeon: Tarry Kos, MD;  [facility];  Service: Orthopedics;  Laterality: Left;  . TOTAL KNEE ARTHROPLASTY Right 08/04/2019   Procedure: RIGHT  TOTAL KNEE ARTHROPLASTY;  Surgeon: Tarry Kos, MD;  Location: [facility];  Service: Orthopedics;  Laterality: Right;   Social History   Occupational History    Employer: [employer]  Tobacco Use  . Smoking status: Former Smoker    Packs/day: 0.25    Years: 27.00    Pack years: 6.75    Types: Cigarettes    Quit date: 08/20/2018    Years since quitting: 1.3  . Smokeless tobacco: Never Used  Vaping Use  . Vaping Use: Never used  Substance and Sexual Activity  . Alcohol use: Yes    Comment: socially  . Drug use: No  . Sexual activity: Not on file

## 2019-12-11 ENCOUNTER — Encounter: Payer: BC Managed Care – PPO | Admitting: Physical Therapy

## 2019-12-19 ENCOUNTER — Other Ambulatory Visit: Payer: Self-pay | Admitting: Physician Assistant

## 2019-12-30 ENCOUNTER — Telehealth: Payer: Self-pay

## 2019-12-30 NOTE — Telephone Encounter (Signed)
patient called regarding work note , he stated the work note wasn't clarified about the restrictions he stated he returns back to work 1/3 he will come pick the note up. CB:[phone]

## 2019-12-30 NOTE — Telephone Encounter (Signed)
Patient aware that his note actually says NO restrictions

## 2020-02-05 ENCOUNTER — Telehealth: Payer: Self-pay | Admitting: Orthopaedic Surgery

## 2020-02-05 NOTE — Telephone Encounter (Signed)
Pt called stating he had surgery on his L knee awhile ago and recently it's started bothering him. Since pt doesn't have an appt until 03/09/20 he would like a CB to be advised wether he should get a sooner appt or not.   Pt gets off work at 4 and if we call him tomorrow he takes breaks at 8:30 & 10 and has lunch @ 11:30   [phone]

## 2020-02-06 NOTE — Telephone Encounter (Signed)
If he had an injury or is concerned about infection, should come in sooner

## 2020-02-06 NOTE — Telephone Encounter (Signed)
I left voicemail for patient advising.  Asked him to return call to schedule sooner appt if needed or if he has further questions.

## 2020-02-17 ENCOUNTER — Ambulatory Visit (INDEPENDENT_AMBULATORY_CARE_PROVIDER_SITE_OTHER): Payer: Self-pay

## 2020-02-17 ENCOUNTER — Ambulatory Visit (INDEPENDENT_AMBULATORY_CARE_PROVIDER_SITE_OTHER): Payer: Self-pay | Admitting: Orthopaedic Surgery

## 2020-02-17 ENCOUNTER — Ambulatory Visit: Payer: Self-pay

## 2020-02-17 DIAGNOSIS — Z96652 Presence of left artificial knee joint: Secondary | ICD-10-CM

## 2020-02-17 DIAGNOSIS — Z96651 Presence of right artificial knee joint: Secondary | ICD-10-CM

## 2020-02-17 NOTE — Progress Notes (Signed)
Office Visit Note   Patient: Brett [name]           Date of Birth: [birth year]           MRN: [id number] Visit Date: 02/17/2020              Requested by: Lucky Cowboy, MD [address] [place],  [place] [address] PCP: Lucky Cowboy, MD   Assessment & Plan: Visit Diagnoses:  1. Status post total right knee replacement   2. Status post total left knee replacement     Plan: Impression is 1 year status post left total knee replacement and 57-month status post right total knee replacement.  Overall I think he is doing fine but I think his symptoms stem from lack of strength and proprioception and balance.  I strongly encouraged him to really focus on strengthening so that he can get maximum improvement and benefit from the knee replacements.  Dental prophylaxis reinforced.  Recheck in a year with two-view x-rays of the bilateral knees.  Follow-Up Instructions: Return in about 1 year (around 02/16/2021).   Orders:  Orders Placed This Encounter  Procedures  . XR KNEE 3 VIEW LEFT  . XR KNEE 3 VIEW RIGHT  . Ambulatory referral to Physical Therapy   No orders of the defined types were placed in this encounter.     Procedures: No procedures performed   Clinical Data: No additional findings.   Subjective: Chief Complaint  Patient presents with  . Left Knee - Pain    Brett [name] is 1 year status post left total knee replacement and 35-month status post right total knee replacement.  He returned back to work about a month ago and overall it is going okay.  Occasionally he uses a cane.  He still feels some weakness in his quadriceps and legs which will occasionally causes knees to buckle.  He feels some soreness and a pinching sensation on the lateral aspect of the knee at times.   Review of Systems   Objective: Vital Signs: There were no vitals taken for this visit.  Physical Exam  Ortho Exam Bilateral knees show fully healed surgical scars.  He has  mild residual swelling of the right knee.  Range of motion is satisfactory.  Collaterals are stable. Specialty Comments:  No specialty comments available.  Imaging: XR KNEE 3 VIEW LEFT  Result Date: 02/17/2020 Stable total knee replacement without complication  XR KNEE 3 VIEW RIGHT  Result Date: 02/17/2020 Stable total knee replacement without complication.    PMFS History: Patient Active Problem List   Diagnosis Date Noted  . Status post total knee replacement, right 08/04/2019  . Status post total left knee replacement 01/24/2019  . Overweight (BMI 25.0-29.9) 08/08/2017  . Medication management 03/23/2014  . Vitamin D deficiency 03/23/2014  . Abnormal glucose 03/23/2014  . Hyperlipemia 10/31/2006  . GERD 10/31/2006   Past Medical History:  Diagnosis Date  . Allergy    albuterol as needed  . Anal fissure   . Arthritis   . Colon polyps 01/13/11   Colonoscopy  . GERD (gastroesophageal reflux disease)   . Pneumonia   . Pre-diabetes     Family History  Problem Relation Age of Onset  . Rheum arthritis Mother   . Heart disease Mother   . Diabetes Mother   . Arthritis Mother   . Diabetes Father   . Blindness Father        from DM?  Marland Kitchen Allergies Daughter   .  Allergies Sister   . Asthma Daughter     Past Surgical History:  Procedure Laterality Date  . ANAL FISSURE REPAIR  02/24/11  . CHONDROPLASTY  08/14/2018   Procedure: CHONDROPLASTY;  Surgeon: Tarry Kos, MD;  Location: [facility];  Service: Orthopedics;;  . COLONOSCOPY    . KNEE ARTHROSCOPY WITH MEDIAL MENISECTOMY Left 08/14/2018   Procedure: LEFT KNEE ARTHROSCOPY WITH PARTIAL MEDIAL MENISCECTOMY;  Surgeon: Tarry Kos, MD;  Location: [facility];  Service: Orthopedics;  Laterality: Left;  . KNEE ARTHROSCOPY WITH MEDIAL MENISECTOMY Right 10/18/2018   Procedure: RIGHT KNEE ARTHROSCOPY WITH PARTIAL MEDIAL MENISCECTOMY;  Surgeon: Tarry Kos, MD;  [facility];  Service: Orthopedics;  Laterality: Right;  . TOTAL KNEE ARTHROPLASTY Left 01/24/2019   Procedure: LEFT TOTAL KNEE ARTHROPLASTY;  Surgeon: Tarry Kos, MD;  Location: [facility];  Service: Orthopedics;  Laterality: Left;  . TOTAL KNEE ARTHROPLASTY Right 08/04/2019   Procedure: RIGHT TOTAL KNEE ARTHROPLASTY;  Surgeon: Tarry Kos, MD;  Location: [facility];  Service: Orthopedics;  Laterality: Right;   Social History   Occupational History    Employer: [employer]  Tobacco Use  . Smoking status: Former Smoker    Packs/day: 0.25    Years: 27.00    Pack years: 6.75    Types: Cigarettes    Quit date: 08/20/2018    Years since quitting: 1.4  . Smokeless tobacco: Never Used  Vaping Use  . Vaping Use: Never used  Substance and Sexual Activity  . Alcohol use: Yes    Comment: socially  . Drug use: No  . Sexual activity: Not on file

## 2020-03-09 ENCOUNTER — Ambulatory Visit: Payer: BC Managed Care – PPO | Admitting: Orthopaedic Surgery

## 2020-03-17 ENCOUNTER — Telehealth: Payer: Self-pay

## 2020-03-17 ENCOUNTER — Other Ambulatory Visit: Payer: Self-pay | Admitting: Physician Assistant

## 2020-03-17 MED ORDER — TRAMADOL HCL 50 MG PO TABS
50.0000 mg | ORAL_TABLET | Freq: Two times a day (BID) | ORAL | 2 refills | Status: DC | PRN
Start: 2020-03-17 — End: 2021-04-18

## 2020-03-17 NOTE — Telephone Encounter (Signed)
Pt called into the office asking if he can have a refill on his pain medication.

## 2020-03-17 NOTE — Telephone Encounter (Signed)
Sent in tramadol

## 2020-03-18 ENCOUNTER — Other Ambulatory Visit: Payer: Self-pay | Admitting: Internal Medicine

## 2020-03-18 MED ORDER — GABAPENTIN 600 MG PO TABS
ORAL_TABLET | ORAL | 0 refills | Status: DC
Start: 2020-03-18 — End: 2021-01-25

## 2020-03-24 ENCOUNTER — Other Ambulatory Visit: Payer: Self-pay

## 2020-03-24 DIAGNOSIS — R7309 Other abnormal glucose: Secondary | ICD-10-CM

## 2020-03-24 MED ORDER — GLUCOSE BLOOD VI STRP: ORAL_STRIP | 3 refills | Status: AC

## 2020-04-27 NOTE — Progress Notes (Signed)
error 

## 2020-05-19 ENCOUNTER — Other Ambulatory Visit: Payer: Self-pay | Admitting: Physician Assistant

## 2020-06-10 ENCOUNTER — Encounter: Payer: Self-pay | Admitting: Internal Medicine

## 2020-06-10 ENCOUNTER — Other Ambulatory Visit: Payer: Self-pay | Admitting: Internal Medicine

## 2020-06-10 DIAGNOSIS — R059 Cough, unspecified: Secondary | ICD-10-CM

## 2020-06-11 ENCOUNTER — Other Ambulatory Visit: Payer: Self-pay | Admitting: Internal Medicine

## 2020-06-11 ENCOUNTER — Other Ambulatory Visit: Payer: Self-pay

## 2020-06-11 MED ORDER — IBUPROFEN 800 MG PO TABS: ORAL_TABLET | ORAL | 0 refills | Status: DC

## 2020-06-11 MED ORDER — BENZONATATE 200 MG PO CAPS: ORAL_CAPSULE | ORAL | 1 refills | Status: DC

## 2020-06-11 NOTE — Telephone Encounter (Signed)
ok 

## 2020-06-12 ENCOUNTER — Other Ambulatory Visit: Payer: Self-pay | Admitting: Internal Medicine

## 2020-06-12 MED ORDER — DEXAMETHASONE 4 MG PO TABS: ORAL_TABLET | ORAL | 0 refills | Status: DC

## 2020-06-29 ENCOUNTER — Other Ambulatory Visit: Payer: Self-pay

## 2020-06-29 ENCOUNTER — Ambulatory Visit
Admission: RE | Admit: 2020-06-29 | Discharge: 2020-06-29 | Disposition: A | Discharge status: Home / Self Care | Payer: Self-pay | Source: Ambulatory Visit | Attending: Internal Medicine | Admitting: Internal Medicine

## 2020-06-29 DIAGNOSIS — R059 Cough, unspecified: Secondary | ICD-10-CM

## 2020-07-08 ENCOUNTER — Ambulatory Visit (INDEPENDENT_AMBULATORY_CARE_PROVIDER_SITE_OTHER): Payer: BC Managed Care – PPO

## 2020-07-08 ENCOUNTER — Ambulatory Visit (INDEPENDENT_AMBULATORY_CARE_PROVIDER_SITE_OTHER): Payer: BC Managed Care – PPO | Admitting: Orthopaedic Surgery

## 2020-07-08 ENCOUNTER — Encounter: Payer: Self-pay | Admitting: Orthopaedic Surgery

## 2020-07-08 DIAGNOSIS — Z96652 Presence of left artificial knee joint: Secondary | ICD-10-CM

## 2020-07-08 MED ORDER — DICLOFENAC SODIUM 75 MG PO TBEC: 75.0000 mg | DELAYED_RELEASE_TABLET | Freq: Two times a day (BID) | ORAL | 2 refills | Status: DC | PRN

## 2020-07-08 NOTE — Progress Notes (Addendum)
Patient: Brett [name]           Date of Birth: 1960/11/25           MRN: [id number] Visit Date: 07/08/2020 PCP: Lucky Cowboy, MD   Assessment & Plan:  Chief Complaint:  Chief Complaint  Patient presents with   Left Knee - Routine Post Op   Visit Diagnoses:  1. Status post total left knee replacement     Plan: Patient is a pleasant 60 year old gentleman who comes in today with a year and a half out left total knee replacement, date of surgery 01/24/2019.  He is still complaining of pain to the left knee, primarily to the lateral aspect.  Pain is worse with walking or standing for long period of time.  He also has pain first thing in the morning when getting out of bed.  He has been taking gabapentin and ibuprofen without significant relief.  Examination of the left knee reveals mild tenderness to the fibular head as well as the lateral joint line.  Range of motion 0 to 120 degrees.  He is stable valgus stress.  4 out of 5 strength with resisted straight leg raise.  he is neurovascular intact distally. At this point, I believe that he needs additional physical therapy to work on quadricep strengthening.  Also recommended Voltaren gel.  Dental prophylaxis reinforced.  Follow-up with [place] in 6 months time when he is 2 years out from surgery.  Call with concerns or questions in the meantime.  Follow-Up Instructions: Return in about 6 months (around 01/08/2021).   Orders:  Orders Placed This Encounter  Procedures   XR Knee 1-2 Views Left   No orders of the defined types were placed in this encounter.   Imaging: XR Knee 1-2 Views Left  Result Date: 07/08/2020 X-rays demonstrate a well-seated prosthesis without complication   PMFS History: Patient Active Problem List   Diagnosis Date Noted   Status post total knee replacement, right 08/04/2019   Status post total left knee replacement 01/24/2019   Overweight (BMI 25.0-29.9) 08/08/2017   Medication management 03/23/2014   Vitamin  D deficiency 03/23/2014   Abnormal glucose 03/23/2014   Hyperlipemia 10/31/2006   GERD 10/31/2006   Past Medical History:  Diagnosis Date   Allergy    albuterol as needed   Anal fissure    Arthritis    Colon polyps 01/13/11   Colonoscopy   GERD (gastroesophageal reflux disease)    Pneumonia    Pre-diabetes     Family History  Problem Relation Age of Onset   Rheum arthritis Mother    Heart disease Mother    Diabetes Mother    Arthritis Mother    Diabetes Father    Blindness Father        from DM?   Allergies Daughter    Allergies Sister    Asthma Daughter     Past Surgical History:  Procedure Laterality Date   ANAL FISSURE REPAIR  02/24/11   CHONDROPLASTY  08/14/2018   Procedure: CHONDROPLASTY;  Surgeon: Tarry Kos, MD;  Location: [facility];  Service: Orthopedics;;   COLONOSCOPY     KNEE ARTHROSCOPY WITH MEDIAL MENISECTOMY Left 08/14/2018   Procedure: LEFT KNEE ARTHROSCOPY WITH PARTIAL MEDIAL MENISCECTOMY;  Surgeon: Tarry Kos, MD;  Location: [facility];  Service: Orthopedics;  Laterality: Left;   KNEE ARTHROSCOPY WITH MEDIAL MENISECTOMY Right 10/18/2018   Procedure: RIGHT KNEE ARTHROSCOPY WITH PARTIAL MEDIAL MENISCECTOMY;  Surgeon: Gershon Mussel  M, MD;  Location: [facility];  Service: Orthopedics;  Laterality: Right;   TOTAL KNEE ARTHROPLASTY Left 01/24/2019   Procedure: LEFT TOTAL KNEE ARTHROPLASTY;  Surgeon: Tarry Kos, MD;  [facility];  Service: Orthopedics;  Laterality: Left;   TOTAL KNEE ARTHROPLASTY Right 08/04/2019   Procedure: RIGHT TOTAL KNEE ARTHROPLASTY;  Surgeon: Tarry Kos, MD;  Location: [facility];  Service: Orthopedics;  Laterality: Right;   Social History   Occupational History    Employer: [employer]  Tobacco Use   Smoking status: Former    Packs/day: 0.25    Years: 27.00    Pack years: 6.75    Types: Cigarettes    Quit date: 08/20/2018    Years since quitting: 1.8   Smokeless tobacco: Never   Vaping Use   Vaping Use: Never used  Substance and Sexual Activity   Alcohol use: Yes    Comment: socially   Drug use: No   Sexual activity: Not on file

## 2020-07-12 ENCOUNTER — Other Ambulatory Visit: Payer: Self-pay

## 2020-07-12 ENCOUNTER — Encounter: Payer: Self-pay | Admitting: Internal Medicine

## 2020-07-12 ENCOUNTER — Ambulatory Visit (INDEPENDENT_AMBULATORY_CARE_PROVIDER_SITE_OTHER): Payer: BC Managed Care – PPO | Admitting: Internal Medicine

## 2020-07-12 VITALS — BP 124/80 | HR 82 | Temp 97.5°F | Resp 17 | Ht 65.0 in | Wt 165.0 lb

## 2020-07-12 DIAGNOSIS — E559 Vitamin D deficiency, unspecified: Secondary | ICD-10-CM | POA: Diagnosis not present

## 2020-07-12 DIAGNOSIS — Z1389 Encounter for screening for other disorder: Secondary | ICD-10-CM | POA: Diagnosis not present

## 2020-07-12 DIAGNOSIS — N401 Enlarged prostate with lower urinary tract symptoms: Secondary | ICD-10-CM | POA: Diagnosis not present

## 2020-07-12 DIAGNOSIS — Z131 Encounter for screening for diabetes mellitus: Secondary | ICD-10-CM

## 2020-07-12 DIAGNOSIS — R7309 Other abnormal glucose: Secondary | ICD-10-CM

## 2020-07-12 DIAGNOSIS — K219 Gastro-esophageal reflux disease without esophagitis: Secondary | ICD-10-CM

## 2020-07-12 DIAGNOSIS — Z125 Encounter for screening for malignant neoplasm of prostate: Secondary | ICD-10-CM | POA: Diagnosis not present

## 2020-07-12 DIAGNOSIS — E782 Mixed hyperlipidemia: Secondary | ICD-10-CM

## 2020-07-12 DIAGNOSIS — I10 Essential (primary) hypertension: Secondary | ICD-10-CM

## 2020-07-12 DIAGNOSIS — Z8249 Family history of ischemic heart disease and other diseases of the circulatory system: Secondary | ICD-10-CM

## 2020-07-12 DIAGNOSIS — R5383 Other fatigue: Secondary | ICD-10-CM

## 2020-07-12 DIAGNOSIS — Z136 Encounter for screening for cardiovascular disorders: Secondary | ICD-10-CM

## 2020-07-12 DIAGNOSIS — Z13220 Encounter for screening for lipoid disorders: Secondary | ICD-10-CM

## 2020-07-12 DIAGNOSIS — Z0000 Encounter for general adult medical examination without abnormal findings: Secondary | ICD-10-CM

## 2020-07-12 DIAGNOSIS — Z87891 Personal history of nicotine dependence: Secondary | ICD-10-CM

## 2020-07-12 DIAGNOSIS — Z1329 Encounter for screening for other suspected endocrine disorder: Secondary | ICD-10-CM

## 2020-07-12 DIAGNOSIS — Z111 Encounter for screening for respiratory tuberculosis: Secondary | ICD-10-CM

## 2020-07-12 DIAGNOSIS — Z130 Encounter for screening for diseases of the blood and blood-forming organs and certain disorders involving the immune mechanism: Secondary | ICD-10-CM

## 2020-07-12 DIAGNOSIS — Z0001 Encounter for general adult medical examination with abnormal findings: Secondary | ICD-10-CM

## 2020-07-12 DIAGNOSIS — R350 Frequency of micturition: Secondary | ICD-10-CM | POA: Diagnosis not present

## 2020-07-12 DIAGNOSIS — Z79899 Other long term (current) drug therapy: Secondary | ICD-10-CM

## 2020-07-12 DIAGNOSIS — Z1211 Encounter for screening for malignant neoplasm of colon: Secondary | ICD-10-CM

## 2020-07-12 DIAGNOSIS — R0989 Other specified symptoms and signs involving the circulatory and respiratory systems: Secondary | ICD-10-CM

## 2020-07-12 NOTE — Progress Notes (Addendum)
Annual  Screening/Preventative Visit  & Comprehensive Evaluation & Examination  Future Appointments  Date Time Provider Department Center  02/15/2021  3:00 PM Tarry Kos, MD [facility] None  07/12/2021  3:00 PM Lucky Cowboy, MD [facility] None            This very nice 60 y.o. MBM presents for a Screening /Preventative Visit & comprehensive evaluation and management of multiple medical co-morbidities.  Patient has been followed for HTN, HLD,  Prediabetes and Vitamin D Deficiency.       Patient has been followed expectantly for labile HTN.  Patient's BP has been controlled at home.  Today's BP is at goal - 124/80. Patient denies any cardiac symptoms as chest pain, palpitations, shortness of breath, dizziness or ankle swelling.  BP Readings from Last 3 Encounters:  07/12/20 124/80  10/13/19 128/74  08/26/19 140/90        Patient's hyperlipidemia is not  controlled with diet.  Last lipids were not at goal:  Lab Results  Component Value Date   CHOL 195 10/13/2019   HDL 57 10/13/2019   LDLCALC 122 (H) 10/13/2019   TRIG 66 10/13/2019   CHOLHDL 3.4 10/13/2019         Patient has hx/o prediabetes ( A1c 5.7%  /2015 and A1c 6.2%  /Jan 2021) and patient denies reactive hypoglycemic symptoms, visual blurring, diabetic polys or paresthesias. Last A1c was near goal:   Lab Results  Component Value Date   HGBA1C 5.9 (H) 10/13/2019          Finally, patient has history of Vitamin D Deficiency ("32"/ 2015)  and last vitamin D was still very low:   Lab Results  Component Value Date   VD25OH 36 10/13/2019     Current Outpatient Medications on File Prior to Visit  Medication Sig   Cholecalciferol (VITAMIN D-3) 125 MCG (5000 UT) TABS Take 5,000 Units by mouth daily.   diclofenac (VOLTAREN) 75 MG EC tablet Take 1 tablet (75 mg total) by mouth 2 (two) times daily as needed.   docusate sodium (COLACE) 100 MG capsule Take 1 capsule (100 mg total) by mouth daily as needed.    gabapentin (NEURONTIN) 600 MG tablet Take  1 tablet  3 to 4 x /day  for Severe Pain   glucose blood test strip Test blood sugar once daily   ibuprofen (ADVIL) 800 MG tablet Take  1 tablet   3 x /day (every 8 hours)   as needed for Pain & Inflammation   Melatonin 10 MG TABS Take 10 mg by mouth at bedtime as needed (sleep).   methocarbamol (ROBAXIN) 750 MG tablet Take 1 tablet (750 mg total) by mouth 2 (two) times daily as needed for muscle spasms.   promethazine (PHENERGAN) 12.5 MG tablet Take 1 tablet (12.5 mg total) by mouth every 6 (six) hours as needed for nausea or vomiting.   sildenafil (VIAGRA) 100 MG tablet Take 1 tablet (100 mg total) by mouth as needed for erectile dysfunction.   traMADol (ULTRAM) 50 MG tablet Take 1 tablet (50 mg total) by mouth 2 (two) times daily as needed.      Allergies  Allergen Reactions   Benzodiazepines Hives   Chantix  [Varenicline] Hives   Cialis [Tadalafil]     Hives   Dicyclomine Hives   Meloxicam     Rash   Penicillins Hives    TOLERATED ANCEF 08/04/2019.  DID THE REACTION INVOLVE: Swelling of the face/tongue/throat, SOB, or low  BP? Unknown Sudden or severe rash/hives, skin peeling, or the inside of the mouth or nose? Unknown Did it require medical treatment? n  When did it last happen? Over 5 years ago.  If all above answers are "NO", may proceed with cephalosporin use.    Relafen [Nabumetone]     Hives     Past Medical History:  Diagnosis Date   Allergy    albuterol as needed   Anal fissure    Arthritis    Colon polyps 01/13/11   Colonoscopy   GERD (gastroesophageal reflux disease)    Pneumonia    Pre-diabetes      Health Maintenance  Topic Date Due   COVID-19 Vaccine (5 - Booster for Pfizer series) 07/09/2020   Pneumococcal Vaccine 48-30 Years old (2 - PCV) 10/12/2020 (Originally 10/14/2010)   Zoster Vaccines- Shingrix (1 of 2) 10/12/2020 (Originally 12/07/1979)   INFLUENZA VACCINE  08/02/2020   TETANUS/TDAP  01/02/2021    COLONOSCOPY (Pts 45-68yrs Insurance coverage will need to be confirmed)  01/12/2021   Hepatitis C Screening  Completed   HIV Screening  Completed   HPV VACCINES  Aged Out     Immunization History  Administered Date(s) Administered   Influenza Inj Mdck Quad  10/18/2017   Influenza Split 10/13/2019   Influenza Whole 10/31/2006, 11/03/2010, 11/02/2011   Influenza,inj,Quad  09/12/2018   Influenza 01/03/2012   PFIZER SARS-COV-2 Vacc 03/25/2019, 04/15/2019, 11/25/2019, 03/09/2020   PPD Test 03/17/2013   Pneumococcal-23 10/13/2009   Td 01/03/2008   Tdap 01/03/2011    Last Colon - 01/13/2011 -   Dr Charlott Rakes - 10 years   Past Surgical History:  Procedure Laterality Date   ANAL FISSURE REPAIR  02/24/11   CHONDROPLASTY  08/14/2018   Procedure: CHONDROPLASTY;  Surgeon: Tarry Kos, MD;  Location: [facility];  Service: Orthopedics;;   COLONOSCOPY     KNEE ARTHROSCOPY WITH MEDIAL MENISECTOMY Left 08/14/2018   Procedure: LEFT KNEE ARTHROSCOPY WITH PARTIAL MEDIAL MENISCECTOMY;  Surgeon: Tarry Kos, MD;  Location: [facility];  Service: Orthopedics;  Laterality: Left;   KNEE ARTHROSCOPY WITH MEDIAL MENISECTOMY Right 10/18/2018   Procedure: RIGHT KNEE ARTHROSCOPY WITH PARTIAL MEDIAL MENISCECTOMY;  Surgeon: Tarry Kos, MD;  Location: [facility];  Service: Orthopedics;  Laterality: Right;   TOTAL KNEE ARTHROPLASTY Left 01/24/2019   Procedure: LEFT TOTAL KNEE ARTHROPLASTY;  Surgeon: Tarry Kos, MD;  Location: [facility];  Service: Orthopedics;  Laterality: Left;   TOTAL KNEE ARTHROPLASTY Right 08/04/2019   Procedure: RIGHT TOTAL KNEE ARTHROPLASTY;  Surgeon: Tarry Kos, MD;  Location: [facility];  Service: Orthopedics;  Laterality: Right;     Family History  Problem Relation Age of Onset   Rheum arthritis Mother    Heart disease Mother    Diabetes Mother    Arthritis Mother    Diabetes Father    Blindness Father        from DM?   Allergies  Daughter    Allergies Sister    Asthma Daughter     Social History   Socioeconomic History   Marital status: Married    Spouse name: Not on file   Number of children: 2  Occupational History    Employer: [employer]  Tobacco Use   Smoking status: Former    Packs/day: 0.25    Years: 27.00    Pack years: 6.75    Types: Cigarettes    Quit date: 08/20/2018    Years  since quitting: 1.8   Smokeless tobacco: Never  Vaping Use   Vaping Use: Never used  Substance and Sexual Activity   Alcohol use: Yes    Comment: socially   Drug use: No   Sexual activity: Not on file  Other Topics Concern    ROS Constitutional: Denies fever, chills, weight loss/gain, headaches, insomnia,  night sweats or change in appetite. Does c/o fatigue. Eyes: Denies redness, blurred vision, diplopia, discharge, itchy or watery eyes.  ENT: Denies discharge, congestion, post nasal drip, epistaxis, sore throat, earache, hearing loss, dental pain, Tinnitus, Vertigo, Sinus pain or snoring.  Cardio: Denies chest pain, palpitations, irregular heartbeat, syncope, dyspnea, diaphoresis, orthopnea, PND, claudication or edema Respiratory: denies cough, dyspnea, DOE, pleurisy, hoarseness, laryngitis or wheezing.  Gastrointestinal: Denies dysphagia, heartburn, reflux, water brash, pain, cramps, nausea, vomiting, bloating, diarrhea, constipation, hematemesis, melena, hematochezia, jaundice or hemorrhoids Genitourinary: Denies dysuria, frequency, urgency, nocturia, hesitancy, discharge, hematuria or flank pain Musculoskeletal: Denies arthralgia, myalgia, stiffness, Jt. Swelling, pain, limp or strain/sprain. Denies Falls. Skin: Denies puritis, rash, hives, warts, acne, eczema or change in skin lesion Neuro: No weakness, tremor, incoordination, spasms, paresthesia or pain Psychiatric: Denies confusion, memory loss or sensory loss. Denies Depression. Endocrine: Denies change in weight, skin, hair change, nocturia, and paresthesia,  diabetic polys, visual blurring or hyper / hypo glycemic episodes.  Heme/Lymph: No excessive bleeding, bruising or enlarged lymph nodes.   Physical Exam  BP 124/80   Pulse 82   Temp (!) 97.5 F (36.4 C)   Resp 17   Ht 5\' 5"  (1.651 m)   Wt 165 lb (74.8 kg)   SpO2 96%   BMI 27.46 kg/m   General Appearance: Well nourished and well groomed and in no apparent distress.  Eyes: PERRLA, EOMs, conjunctiva no swelling or erythema, normal fundi and vessels. Sinuses: No frontal/maxillary tenderness ENT/Mouth: EACs patent / TMs  nl. Nares clear without erythema, swelling, mucoid exudates. Oral hygiene is good. No erythema, swelling, or exudate. Tongue normal, non-obstructing. Tonsils not swollen or erythematous. Hearing normal.  Neck: Supple, thyroid not palpable. No bruits, nodes or JVD. Respiratory: Respiratory effort normal.  BS equal and clear bilateral without rales, rhonci, wheezing or stridor. Cardio: Heart sounds are normal with regular rate and rhythm and no murmurs, rubs or gallops. Peripheral pulses are normal and equal bilaterally without edema. No aortic or femoral bruits. Chest: symmetric with normal excursions and percussion.  Abdomen: Soft, with Nl bowel sounds. Nontender, no guarding, rebound, hernias, masses, or organomegaly.  Lymphatics: Non tender without lymphadenopathy.  Musculoskeletal: Full ROM all peripheral extremities, joint stability, 5/5 strength, and normal gait. Skin: Warm and dry without rashes, lesions, cyanosis, clubbing or  ecchymosis.  Neuro: Cranial nerves intact, reflexes equal bilaterally. Normal muscle tone, no cerebellar symptoms. Sensation intact.  Pysch: Alert and oriented X 3 with normal affect, insight and judgment appropriate.   Assessment and Plan  1. Annual Preventative/Screening Exam    2. Labile hypertension  - EKG 12-Lead - , RETROPERITNL ABD,  LTD - Urinalysis, Routine w reflex microscopic - Microalbumin / creatinine urine ratio -  COMPLETE METABOLIC PANEL WITH GFR - Magnesium - TSH  3. Hyperlipidemia, mixed  - EKG 12-Lead - [place], RETROPERITNL ABD,  LTD - Lipid panel - TSH  4. Abnormal glucose  - EKG 12-Lead - [place], RETROPERITNL ABD,  LTD - Magnesium - Hemoglobin A1c - Insulin, random  5. Vitamin D deficiency  - VITAMIN D 25 Hydroxy   6. Gastroesophageal reflux disease  - CBC  with Differential/Platelet  7. Screening for colorectal cancer  - POC Hemoccult Bld/Stl   8. Prostate cancer screening  - PSA  9. Screening-pulmonary TB  - TB Skin Test  10. Screening for ischemic heart disease  - EKG 12-Lead  11. FHx: heart disease  - EKG 12-Lead - US, RETROPERITNL ABD,  LTD  12. Former smoker  - EKG 12-Lead - US, RETROPERITNL ABD,  LTD  13. Screening for AAA (aortic abdominal aneurysm)  - US, RETROPERITNL ABD,  LTD  14. Fatigue, unspecified type  - Iron, Total/Total Iron Binding Cap - Vitamin B12 - Testosterone - CBC with Differential/Platelet - TSH  15. Medication management  - Urinalysis, Routine w reflex microscopic - Microalbumin / creatinine urine ratio - CBC with Differential/Platelet - COMPLETE METABOLIC PANEL WITH GFR - Magnesium - Lipid panel - TSH - Hemoglobin A1c - Insulin, random - VITAMIN D 25 Hydrox          Patient was counseled in prudent diet, weight control to achieve/maintain BMI less than 25, BP monitoring, regular exercise and medications as discussed.  Discussed med effects and SE's. Routine screening labs and tests as requested with regular follow-up as recommended. Over 40 minutes of exam, counseling, chart review and high complex critical decision making was performed   Brett MawWilliam D Athalie Newhard, MD

## 2020-07-12 NOTE — Patient Instructions (Signed)

## 2020-07-13 DIAGNOSIS — Z111 Encounter for screening for respiratory tuberculosis: Secondary | ICD-10-CM | POA: Diagnosis not present

## 2020-07-13 LAB — URINALYSIS, ROUTINE W REFLEX MICROSCOPIC
Bacteria, UA: NONE SEEN /HPF
Bilirubin Urine: NEGATIVE
Glucose, UA: NEGATIVE
Hgb urine dipstick: NEGATIVE
Hyaline Cast: NONE SEEN /LPF
Ketones, ur: NEGATIVE
Nitrite: NEGATIVE
RBC / HPF: NONE SEEN /HPF (ref 0–2)
Specific Gravity, Urine: 1.026 (ref 1.001–1.035)
Squamous Epithelial / HPF: NONE SEEN /HPF (ref <=5)
pH: 7.5 (ref 5.0–8.0)

## 2020-07-13 LAB — LIPID PANEL
Cholesterol: 176 mg/dL (ref <200)
HDL: 56 mg/dL (ref >=40)
LDL Cholesterol (Calc): 97 mg/dL (calc)
Non-HDL Cholesterol (Calc): 120 mg/dL (calc) (ref <130)
Total CHOL/HDL Ratio: 3.1 (calc) (ref <5.0)
Triglycerides: 133 mg/dL (ref <150)

## 2020-07-13 LAB — HEMOGLOBIN A1C
Hgb A1c MFr Bld: 6.1 % of total Hgb — ABNORMAL HIGH (ref <5.7)
Mean Plasma Glucose: 128 mg/dL
eAG (mmol/L): 7.1 mmol/L

## 2020-07-13 LAB — VITAMIN B12: Vitamin B-12: 596 pg/mL (ref 200–1100)

## 2020-07-13 LAB — IRON, TOTAL/TOTAL IRON BINDING CAP
%SAT: 21 % (calc) (ref 20–48)
Iron: 66 ug/dL (ref 50–180)
TIBC: 315 mcg/dL (calc) (ref 250–425)

## 2020-07-13 LAB — COMPLETE METABOLIC PANEL WITH GFR
AG Ratio: 1.8 (calc) (ref 1.0–2.5)
ALT: 21 U/L (ref 9–46)
AST: 19 U/L (ref 10–35)
Albumin: 4.2 g/dL (ref 3.6–5.1)
Alkaline phosphatase (APISO): 101 U/L (ref 35–144)
BUN: 13 mg/dL (ref 7–25)
CO2: 26 mmol/L (ref 20–32)
Calcium: 9.1 mg/dL (ref 8.6–10.3)
Chloride: 108 mmol/L (ref 98–110)
Creat: 1.3 mg/dL (ref 0.70–1.30)
Globulin: 2.4 g/dL (calc) (ref 1.9–3.7)
Glucose, Bld: 81 mg/dL (ref 65–99)
Potassium: 4.3 mmol/L (ref 3.5–5.3)
Sodium: 141 mmol/L (ref 135–146)
Total Bilirubin: 0.3 mg/dL (ref 0.2–1.2)
Total Protein: 6.6 g/dL (ref 6.1–8.1)
eGFR: 63 mL/min/{1.73_m2} (ref >=60)

## 2020-07-13 LAB — CBC WITH DIFFERENTIAL/PLATELET
Absolute Monocytes: 493 cells/uL (ref 200–950)
Basophils Absolute: 28 cells/uL (ref 0–200)
Basophils Relative: 0.5 %
Eosinophils Absolute: 230 cells/uL (ref 15–500)
Eosinophils Relative: 4.1 %
HCT: 40.6 % (ref 38.5–50.0)
Hemoglobin: 12.9 g/dL — ABNORMAL LOW (ref 13.2–17.1)
Lymphs Abs: 2526 cells/uL (ref 850–3900)
MCH: 26.4 pg — ABNORMAL LOW (ref 27.0–33.0)
MCHC: 31.8 g/dL — ABNORMAL LOW (ref 32.0–36.0)
MCV: 83 fL (ref 80.0–100.0)
MPV: 10.7 fL (ref 7.5–12.5)
Monocytes Relative: 8.8 %
Neutro Abs: 2324 cells/uL (ref 1500–7800)
Neutrophils Relative %: 41.5 %
Platelets: 280 10*3/uL (ref 140–400)
RBC: 4.89 10*6/uL (ref 4.20–5.80)
RDW: 12.7 % (ref 11.0–15.0)
Total Lymphocyte: 45.1 %
WBC: 5.6 10*3/uL (ref 3.8–10.8)

## 2020-07-13 LAB — MAGNESIUM: Magnesium: 1.9 mg/dL (ref 1.5–2.5)

## 2020-07-13 LAB — INSULIN, RANDOM: Insulin: 6.1 u[IU]/mL

## 2020-07-13 LAB — MICROALBUMIN / CREATININE URINE RATIO
Creatinine, Urine: 216 mg/dL (ref 20–320)
Microalb Creat Ratio: 3 mcg/mg creat (ref <30)
Microalb, Ur: 0.6 mg/dL

## 2020-07-13 LAB — MICROSCOPIC MESSAGE

## 2020-07-13 LAB — TESTOSTERONE: Testosterone: 188 ng/dL — ABNORMAL LOW (ref 250–827)

## 2020-07-13 LAB — VITAMIN D 25 HYDROXY (VIT D DEFICIENCY, FRACTURES): Vit D, 25-Hydroxy: 63 ng/mL (ref 30–100)

## 2020-07-13 LAB — TSH: TSH: 1.63 mIU/L (ref 0.40–4.50)

## 2020-07-13 LAB — PSA: PSA: 1.66 ng/mL (ref <=4.00)

## 2020-07-14 ENCOUNTER — Other Ambulatory Visit: Payer: Self-pay | Admitting: Internal Medicine

## 2020-07-14 MED ORDER — SULFAMETHOXAZOLE-TRIMETHOPRIM 800-160 MG PO TABS: ORAL_TABLET | ORAL | 0 refills | Status: DC

## 2020-07-14 NOTE — Progress Notes (Signed)
============================================================ -   Test results slightly outside the reference range are not unusual. If there is anything important, I will review this with you,  otherwise it is considered normal test values.  If you have further questions,  please do not hesitate to contact me at the office or via My Chart.  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 shows prostate infection, So sent in new Rx for Antibiotic to drug Store ============================================================ ============================================================  -  Iron  and Vitamin B12 levels Normal & OK  ============================================================ ============================================================  -  PSA - Normal &  OK  ============================================================ ============================================================  -  Testosterone is low - Recommend take Zinc 50 mg  /daily to help                                                                     raise testosterone levels naturally  ! ============================================================ ============================================================  -  Total Chol = 176  and LDL Chol = 97 - Both "OK "  ============================================================ ============================================================  -  A1c = 6.1% Blood sugar and A1c are elevated in the borderline and  early or pre-diabetes range which has the same   300% increased risk for heart attack, stroke, cancer and                                     alzheimer- type vascular dementia as full blown diabetes.   But the good news is that diet, exercise with                                             weight loss can cure the early diabetes at this  point. ============================================================ ============================================================  -  Vitamin  D  = 63 - Excellent   ! ============================================================ ============================================================  -  All Else - CBC - Kidneys - Electrolytes - Liver - Magnesium & Thyroid    - all  Normal / OK ===========================================================

## 2020-07-15 LAB — TB SKIN TEST
Induration: 0 mm
TB Skin Test: NEGATIVE

## 2020-10-08 ENCOUNTER — Telehealth: Payer: Self-pay | Admitting: Orthopaedic Surgery

## 2020-10-08 NOTE — Telephone Encounter (Signed)
Pt called requesting a new handicap placard. Pt states the one he has is about to expire. Please call pt at [phone] when ready for pick up.

## 2020-10-11 NOTE — Telephone Encounter (Signed)
Ok to do 3 month temp, but that is it

## 2020-10-12 ENCOUNTER — Ambulatory Visit: Payer: BC Managed Care – PPO | Admitting: Nurse Practitioner

## 2020-10-12 NOTE — Telephone Encounter (Signed)
Ready for pick up at the front desk. Patient aware.   

## 2020-10-14 NOTE — Progress Notes (Signed)
FOLLOW UP 3 MONTH  Assessment and Plan:  Hyperlipidemia, unspecified hyperlipidemia type -     Lipid panel - TSH - CMP check lipids decrease fatty foods increase activity.   Abnormal glucose Discussed disease progression and risks Discussed diet/exercise, weight management and risk modification  Overweight (BMI 25.0-29.9) - long discussion about weight loss, diet, and exercise -recommended diet heavy in fruits and veggies and low in animal meats, cheeses, and dairy products  Vitamin D deficiency -     VITAMIN D 25 Hydroxy (Vit-D Deficiency, Fractures)  Medication management -     CBC with Differential/Platelet -     COMPLETE METABOLIC PANEL WITH GFR -     TSH -     Magnesium  Gastroesophageal reflux disease, esophagitis presence not specified Get dietary modifications, avoid NSAIDS  Hypertension, unspecified type - continue medications, DASH diet, exercise and monitor at home. Call if greater than 130/80.   Anemia, unspecified type -     CBC  Anxiety Offered to restart Celexa, pt does not wish to pursue meds currently, will call office if symptoms worsen  Flu Vaccine Need Flu QUAD 6+ MOS PF IM  Osteoarthritis of both hands Currently on Neurontin and Ibuprofen Use voltaren gel up to 4 times a day as needed  Dysphagia Offered GI referral , pt wishes to observe for now, if worsens will refer to GI   Discussed med's effects and SE's. Screening labs and tests as requested with regular follow-up as recommended.  _______________________________________________________________________________________________________  HPI 60 year old male present for 3 month follow up for HTN, HLD, GERD, abnormal glucose, vitamin D deficiency and weight.  He is a smoker, started back up due to cOVID, goes off and on, started when he was 16, stopped in 30's and then off and on. Last CXR 2019. Quit smoking almost a year ago and will occasionally smoke a cigarettes or 2.   He is  having a lot more stress with the family.  He recently lost his father and mother is not doing well.  S/p left knee arthroscopy (01/24/19) and medal menisectomy with Dr. [name] on 08/14/2018. He continues to have pain in his right knee and has an appointment coming up. He reports that he is doing therapy for this.  His next follow up is 10/28/19.  He reports he continous to feel unsteady.  He reports he continues to have swelling on the right leg along with a compression stocking.  He also does exercises at home. Reports he is using oxycodone Prn and methocarbomol PRN with fair results.  he  Lab Results  Component Value Date   [id number] 07/12/2020   BMI is Body mass index is 26.99 kg/m., he is working on diet and exercise. Wt Readings from Last 3 Encounters:  10/25/20 162 lb 3.2 oz (73.6 kg)  07/12/20 165 lb (74.8 kg)  10/13/19 169 lb (76.7 kg)     He is not on cholesterol medication and denies myalgias. His cholesterol is at goal. The cholesterol last visit was:   Lab Results  Component Value Date   CHOL 176 07/12/2020   HDL 56 07/12/2020   LDLCALC 97 07/12/2020   TRIG 133 07/12/2020   CHOLHDL 3.1 07/12/2020    He has been working on diet and exercise for prediabetes, sugars have been checking it, he is not on bASA, he is not on ACE/ARB and denies foot ulcerations, nausea, paresthesia of the feet, polydipsia, polyuria, visual disturbances, vomiting and weight loss. Blood sugars are  running 88-100 fasting and after eating running 110 Last A1C in the office was:  Lab Results  Component Value Date   HGBA1C 6.1 (H) 07/12/2020    Patient is on Vitamin D supplement, does 5000 IU a day.    Lab Results  Component Value Date   VD25OH 63 07/12/2020    Last PSA was: Lab Results  Component Value Date   PSA 1.66 07/12/2020   Denies BPH symptoms daytime frequency, double voiding, dysuria, hematuria, hesitancy, incontinence, intermittency, nocturia, sensation of incomplete bladder  emptying, suprapubic pain, urgency or weak urinary stream.     Current Medications:    Current Outpatient Medications (Cardiovascular):    sildenafil (VIAGRA) 100 MG tablet, Take 1 tablet (100 mg total) by mouth as needed for erectile dysfunction.  Current Outpatient Medications (Respiratory):    promethazine (PHENERGAN) 12.5 MG tablet, Take 1 tablet (12.5 mg total) by mouth every 6 (six) hours as needed for nausea or vomiting.  Current Outpatient Medications (Analgesics):    diclofenac (VOLTAREN) 75 MG EC tablet, Take 1 tablet (75 mg total) by mouth 2 (two) times daily as needed.   ibuprofen (ADVIL) 800 MG tablet, Take  1 tablet   3 x /day (every 8 hours)   as needed for Pain & Inflammation   traMADol (ULTRAM) 50 MG tablet, Take 1 tablet (50 mg total) by mouth 2 (two) times daily as needed.   Current Outpatient Medications (Other):    blood glucose meter kit and supplies, Dispense based insurance preference. E11.22 CHECK BLOOD SUGAR ONCE DAILY   Cholecalciferol (VITAMIN D-3) 125 MCG (5000 UT) TABS, Take 5,000 Units by mouth daily.   Continuous Blood Gluc Receiver (FREESTYLE LIBRE 14 DAY READER) DEVI, 1 Device by Does not apply route every 14 (fourteen) days.   gabapentin (NEURONTIN) 600 MG tablet, Take  1 tablet  3 to 4 x /day  for Severe Pain   glucose blood test strip, Test blood sugar once daily   methocarbamol (ROBAXIN) 750 MG tablet, Take 1 tablet (750 mg total) by mouth 2 (two) times daily as needed for muscle spasms.   Melatonin 10 MG TABS, Take 10 mg by mouth at bedtime as needed (sleep). (Patient not taking: Reported on 10/25/2020)   sulfamethoxazole-trimethoprim (BACTRIM DS) 800-160 MG tablet, Take  1 tablet  2 x /day  with Meals  for 3 weeks  for Prostate Infection (Patient not taking: Reported on 10/25/2020)  Health Maintenance:  Immunization History  Administered Date(s) Administered   Influenza Inj Mdck Quad With Preservative 10/18/2017   Influenza Split 10/13/2019    Influenza Whole 10/31/2006, 11/03/2010, 11/02/2011   Influenza,inj,Quad PF,6+ Mos 09/12/2018   Influenza-Unspecified 01/03/2012   PFIZER(Purple Top)SARS-COV-2 Vaccination 03/25/2019, 04/15/2019, 11/25/2019, 03/09/2020   PPD Test 03/17/2013, 07/13/2020   Pneumococcal-Unspecified 10/13/2009   Td 01/03/2008   Tdap 01/03/2011   Health Maintenance  Topic Date Due   Pneumococcal Vaccine 49-34 Years old (1 - PCV) 12/07/1966   Zoster Vaccines- Shingrix (1 of 2) Never done   COVID-19 Vaccine (5 - Booster for Pfizer series) 05/04/2020   INFLUENZA VACCINE  08/02/2020   TETANUS/TDAP  01/02/2021   COLONOSCOPY (Pts 45-68yr Insurance coverage will need to be confirmed)  01/12/2021   Hepatitis C Screening  Completed   HIV Screening  Completed   HPV VACCINES  Aged Out    Colonoscopy 10/2011 CT head 2008  Patient Care Team: MUnk Pinto MD as PCP - General (Internal Medicine) YDeneise Lever MD as Consulting Physician (Pulmonary Disease)  Wilford Corner, MD as Consulting Physician (Gastroenterology) Unk Pinto, MD as Referring Physician (Internal Medicine)  Allergies:  Allergies  Allergen Reactions   Benzodiazepines Hives   Chantix  [Varenicline] Hives   Cialis [Tadalafil]     Hives   Dicyclomine Hives   Meloxicam     Rash   Penicillins Hives    TOLERATED ANCEF 08/04/2019.  DID THE REACTION INVOLVE: Swelling of the face/tongue/throat, SOB, or low BP? Unknown Sudden or severe rash/hives, skin peeling, or the inside of the mouth or nose? Unknown Did it require medical treatment? n  When did it last happen? Over 5 years ago.  If all above answers are "NO", may proceed with cephalosporin use.    Relafen [Nabumetone]     Hives    Medical History:  Past Medical History:  Diagnosis Date   Allergy    albuterol as needed   Anal fissure    Arthritis    Colon polyps 01/13/11   Colonoscopy   GERD (gastroesophageal reflux disease)    Pneumonia    Pre-diabetes      Surgical History:  Past Surgical History:  Procedure Laterality Date   ANAL FISSURE REPAIR  02/24/11   CHONDROPLASTY  08/14/2018   Procedure: CHONDROPLASTY;  Surgeon: Leandrew Koyanagi, MD;  Location: [place];  Service: Orthopedics;;   COLONOSCOPY     KNEE ARTHROSCOPY WITH MEDIAL MENISECTOMY Left 08/14/2018   Procedure: LEFT KNEE ARTHROSCOPY WITH PARTIAL MEDIAL MENISCECTOMY;  Surgeon: Leandrew Koyanagi, MD;  Location: [place];  Service: Orthopedics;  Laterality: Left;   KNEE ARTHROSCOPY WITH MEDIAL MENISECTOMY Right 10/18/2018   Procedure: RIGHT KNEE ARTHROSCOPY WITH PARTIAL MEDIAL MENISCECTOMY;  Surgeon: Leandrew Koyanagi, MD;  Location: [facility] [place];  Service: Orthopedics;  Laterality: Right;   TOTAL KNEE ARTHROPLASTY Left 01/24/2019   Procedure: LEFT TOTAL KNEE ARTHROPLASTY;  Surgeon: Leandrew Koyanagi, MD;  Location: [place];  Service: Orthopedics;  Laterality: Left;   TOTAL KNEE ARTHROPLASTY Right 08/04/2019   Procedure: RIGHT TOTAL KNEE ARTHROPLASTY;  Surgeon: Leandrew Koyanagi, MD;  Location: [facility];  Service: Orthopedics;  Laterality: Right;    Family History:  Family History  Problem Relation Age of Onset   Rheum arthritis Mother    Heart disease Mother    Diabetes Mother    Arthritis Mother    Diabetes Father    Blindness Father        from DM?   Allergies Daughter    Allergies Sister    Asthma Daughter     Social History:   He  reports that he quit smoking about 2 years ago. His smoking use included cigarettes. He has a 6.75 pack-year smoking history. He has never used smokeless tobacco. He reports current alcohol use. He reports that he does not use drugs.  Review of Systems:  Review of Systems  Constitutional:  Negative for chills, fever and weight loss.  HENT:  Negative for congestion, ear pain, hearing loss, sinus pain and sore throat.   Eyes:  Negative for double vision, photophobia and pain.  Respiratory:  Negative for cough and  shortness of breath.   Cardiovascular:  Negative for chest pain, palpitations, orthopnea, claudication, leg swelling and PND.  Gastrointestinal:  Negative for abdominal pain, blood in stool, constipation, diarrhea, melena, nausea and vomiting.       Dysphagia mostly with solids  Genitourinary:  Negative for dysuria, flank pain, frequency, hematuria and urgency.  Musculoskeletal:  Positive for joint pain.  Negative for falls, myalgias and neck pain.  Skin:  Negative for rash.  Neurological:  Negative for dizziness, tingling, sensory change, speech change, focal weakness and headaches.  Psychiatric/Behavioral:  Negative for depression, substance abuse and suicidal ideas. The patient is nervous/anxious. The patient does not have insomnia.    Physical Exam: Estimated body mass index is 26.99 kg/m as calculated from the following:   Height as of 07/12/20: '5\' 5"'  (1.651 m).   Weight as of this encounter: 162 lb 3.2 oz (73.6 kg). BP 112/74   Pulse 80   Temp 97.7 F (36.5 C)   Wt 162 lb 3.2 oz (73.6 kg)   SpO2 99%   BMI 26.99 kg/m   General Appearance: Well nourished, in no apparent distress.  Eyes: PERRLA, EOMs, conjunctiva no swelling or erythema ENT/Mouth: Ear canals clear bilaterally with no erythema, swelling, discharge.  TMs normal bilaterally with no erythema, bulging, or retractions.  Oropharynx clear and moist with no exudate, swelling, or erythema.  Dentition normal.   Neck: Right cervical adenopathy near mastoid/year.  Supple, thyroid normal. No bruits, JVD, cervical adenopathy Respiratory: Respiratory effort normal, BS equal bilaterally without rales, rhonchi, wheezing or stridor.  Cardio: RRR without murmurs, rubs or gallops. Brisk peripheral pulses without edema.  Chest: symmetric, with normal excursions Abdomen: Soft, nontender, no guarding, rebound, hernias, masses, or organomegaly. Musculoskeletal: Full ROM all peripheral extremities,5/5 strength, and antalgic gait. Skin:  Warm, dry without rashes, lesions, ecchymosis. Neuro: A&Ox3, Cranial nerves intact, reflexes equal bilaterally. Normal muscle tone, no cerebellar symptoms. Sensation intact.  Psych: Normal affect, Insight and Judgment appropriate.    Garnet Sierras, Laqueta Jean, DNP [facility] 10/13/2019  4:55PM

## 2020-10-19 ENCOUNTER — Ambulatory Visit: Payer: Self-pay

## 2020-10-19 ENCOUNTER — Ambulatory Visit (INDEPENDENT_AMBULATORY_CARE_PROVIDER_SITE_OTHER): Payer: BC Managed Care – PPO | Admitting: Orthopaedic Surgery

## 2020-10-19 ENCOUNTER — Encounter: Payer: Self-pay | Admitting: Orthopaedic Surgery

## 2020-10-19 ENCOUNTER — Ambulatory Visit: Payer: BC Managed Care – PPO | Admitting: Orthopaedic Surgery

## 2020-10-19 ENCOUNTER — Other Ambulatory Visit: Payer: Self-pay

## 2020-10-19 DIAGNOSIS — Z96651 Presence of right artificial knee joint: Secondary | ICD-10-CM | POA: Insufficient documentation

## 2020-10-19 DIAGNOSIS — M545 Low back pain, unspecified: Secondary | ICD-10-CM | POA: Diagnosis not present

## 2020-10-19 DIAGNOSIS — M4807 Spinal stenosis, lumbosacral region: Secondary | ICD-10-CM

## 2020-10-19 DIAGNOSIS — Z96653 Presence of artificial knee joint, bilateral: Secondary | ICD-10-CM | POA: Diagnosis not present

## 2020-10-19 DIAGNOSIS — Z96652 Presence of left artificial knee joint: Secondary | ICD-10-CM

## 2020-10-19 NOTE — Progress Notes (Signed)
Office Visit Note   Patient: Brett [name]           Date of Birth: 12/18/1960           MRN: [id number] Visit Date: 10/19/2020              Requested by: Lucky Cowboy, MD [address] [place],  [place] [address] PCP: Lucky Cowboy, MD   Assessment & Plan: Visit Diagnoses:  1. Status post total left knee replacement   2. Status post total right knee replacement   3. Spinal stenosis of lumbosacral region   4. Lumbar pain     Plan: In terms of his back we will refer him back to Dr. Alvester Morin for repeat [facility].  In regards to his knee complaints which is mainly related to the left knee I feel that he has developed increased varus laxity since the surgery.  He may be feeling this when he pivots and changes directions.  He would like to start with some hinged knee braces to try.  I reviewed the x-rays with him in detail which show well-seated components without any evidence of loosening or complications.  They are in good alignment.  We did discuss that if he continues to have the symptoms he may benefit from exchanging the polyethylene liner to a larger size.  Follow-Up Instructions: No follow-ups on file.   Orders:  Orders Placed This Encounter  Procedures   XR Knee 1-2 Views Left   XR Knee 1-2 Views Right   Ambulatory referral to Physical Medicine Rehab   No orders of the defined types were placed in this encounter.     Procedures: No procedures performed   Clinical Data: No additional findings.   Subjective: Chief Complaint  Patient presents with   Left Knee - Pain   Lower Back - Pain    Mr. Commisso is here for follow-up of his lumbar spine as well as bilateral total knee replacements.  In terms of his lumbar spine he underwent an ESI with Dr. Alvester Morin in the past and he would like to have another injection.  New symptoms regarding his back.  In terms of his knees he feels like overall he is doing better but he has some achy pain that is worse with  prolonged standing.  His job requires that he stands 9 to 9/2 hours a day and he feels like sometimes it wants to give out on him when he pivots.  Has any constitutional symptoms.   Review of Systems  Constitutional: Negative.   All other systems reviewed and are negative.   Objective: Vital Signs: There were no vitals taken for this visit.  Physical Exam Vitals and nursing note reviewed.  Constitutional:      Appearance: He is well-developed.  Pulmonary:     Effort: Pulmonary effort is normal.  Abdominal:     Palpations: Abdomen is soft.  Skin:    General: Skin is warm.  Neurological:     Mental Status: He is alert and oriented to person, place, and time.  Psychiatric:        Behavior: Behavior normal.        Thought Content: Thought content normal.        Judgment: Judgment normal.    Ortho Exam Lumbar spine exam is unchanged. Left knee exam shows a fully healed surgical scar.  No evidence of infection.  Trace joint effusion.  Range of motion is 0 to 120degrees.  He does have 2+  varus laxity.  There is no valgus laxity. Right knee exam shows a fully healed surgical scar.  No evidence of infection.  No joint effusion.  Range of motion is 0 to 120 degrees.  He does have 1+ varus laxity.  There is no valgus laxity. Specialty Comments:  No specialty comments available.  Imaging: XR Knee 1-2 Views Left  Result Date: 10/19/2020 Stable total knee replacement in good alignment.   XR Knee 1-2 Views Right  Result Date: 10/19/2020 Stable total knee replacement in good alignment     PMFS History: Patient Active Problem List   Diagnosis Date Noted   Status post total right knee replacement 10/19/2020   Status post total knee replacement, right 08/04/2019   Status post total left knee replacement 01/24/2019   Overweight (BMI 25.0-29.9) 08/08/2017   Medication management 03/23/2014   Vitamin D deficiency 03/23/2014   Abnormal glucose 03/23/2014   Hyperlipemia 10/31/2006    GERD 10/31/2006   Past Medical History:  Diagnosis Date   Allergy    albuterol as needed   Anal fissure    Arthritis    Colon polyps 01/13/11   Colonoscopy   GERD (gastroesophageal reflux disease)    Pneumonia    Pre-diabetes     Family History  Problem Relation Age of Onset   Rheum arthritis Mother    Heart disease Mother    Diabetes Mother    Arthritis Mother    Diabetes Father    Blindness Father        from DM?   Allergies Daughter    Allergies Sister    Asthma Daughter     Past Surgical History:  Procedure Laterality Date   ANAL FISSURE REPAIR  02/24/11   CHONDROPLASTY  08/14/2018   Procedure: CHONDROPLASTY;  Surgeon: Tarry Kos, MD;  Location: [facility];  Service: Orthopedics;;   COLONOSCOPY     KNEE ARTHROSCOPY WITH MEDIAL MENISECTOMY Left 08/14/2018   Procedure: LEFT KNEE ARTHROSCOPY WITH PARTIAL MEDIAL MENISCECTOMY;  Surgeon: Tarry Kos, MD;  Location: [facility];  Service: Orthopedics;  Laterality: Left;   KNEE ARTHROSCOPY WITH MEDIAL MENISECTOMY Right 10/18/2018   Procedure: RIGHT KNEE ARTHROSCOPY WITH PARTIAL MEDIAL MENISCECTOMY;  Surgeon: Tarry Kos, MD;  Location: [facility];  Service: Orthopedics;  Laterality: Right;   TOTAL KNEE ARTHROPLASTY Left 01/24/2019   Procedure: LEFT TOTAL KNEE ARTHROPLASTY;  Surgeon: Tarry Kos, MD;  [facility];  Service: Orthopedics;  Laterality: Left;   TOTAL KNEE ARTHROPLASTY Right 08/04/2019   Procedure: RIGHT TOTAL KNEE ARTHROPLASTY;  Surgeon: Tarry Kos, MD;  Location[facility];  Service: Orthopedics;  Laterality: Right;   Social History   Occupational History    Employer: [employer]  Tobacco Use   Smoking status: Former    Packs/day: 0.25    Years: 27.00    Pack years: 6.75    Types: Cigarettes    Quit date: 08/20/2018    Years since quitting: 2.1   Smokeless tobacco: Never  Vaping Use   Vaping Use: Never used  Substance and Sexual Activity   Alcohol use:  Yes    Comment: socially   Drug use: No   Sexual activity: Not on file

## 2020-10-25 ENCOUNTER — Other Ambulatory Visit: Payer: Self-pay

## 2020-10-25 ENCOUNTER — Ambulatory Visit (INDEPENDENT_AMBULATORY_CARE_PROVIDER_SITE_OTHER): Payer: BC Managed Care – PPO | Admitting: Nurse Practitioner

## 2020-10-25 ENCOUNTER — Encounter: Payer: Self-pay | Admitting: Nurse Practitioner

## 2020-10-25 VITALS — BP 112/74 | HR 80 | Temp 97.7°F | Wt 162.2 lb

## 2020-10-25 DIAGNOSIS — R1310 Dysphagia, unspecified: Secondary | ICD-10-CM

## 2020-10-25 DIAGNOSIS — E559 Vitamin D deficiency, unspecified: Secondary | ICD-10-CM

## 2020-10-25 DIAGNOSIS — M19041 Primary osteoarthritis, right hand: Secondary | ICD-10-CM

## 2020-10-25 DIAGNOSIS — R0989 Other specified symptoms and signs involving the circulatory and respiratory systems: Secondary | ICD-10-CM | POA: Diagnosis not present

## 2020-10-25 DIAGNOSIS — Z23 Encounter for immunization: Secondary | ICD-10-CM

## 2020-10-25 DIAGNOSIS — R7309 Other abnormal glucose: Secondary | ICD-10-CM

## 2020-10-25 DIAGNOSIS — K219 Gastro-esophageal reflux disease without esophagitis: Secondary | ICD-10-CM

## 2020-10-25 DIAGNOSIS — F524 Premature ejaculation: Secondary | ICD-10-CM

## 2020-10-25 DIAGNOSIS — M19042 Primary osteoarthritis, left hand: Secondary | ICD-10-CM

## 2020-10-25 DIAGNOSIS — E782 Mixed hyperlipidemia: Secondary | ICD-10-CM

## 2020-10-25 DIAGNOSIS — E663 Overweight: Secondary | ICD-10-CM

## 2020-10-25 DIAGNOSIS — F419 Anxiety disorder, unspecified: Secondary | ICD-10-CM

## 2020-10-25 DIAGNOSIS — F17200 Nicotine dependence, unspecified, uncomplicated: Secondary | ICD-10-CM

## 2020-10-25 DIAGNOSIS — D649 Anemia, unspecified: Secondary | ICD-10-CM

## 2020-10-25 NOTE — Patient Instructions (Signed)
Dysphagia Dysphagia is trouble swallowing. This condition occurs when solids and liquids stick in a person's throat on the way down to the stomach, or when food takes longer to get to the stomach than usual. You may have problems swallowing food, liquids, or both. You may also have pain while trying to swallow. It may take you more time and effort to swallow something. What are the causes? This condition may be caused by: Muscle problems. These may make it difficult for you to move food and liquids through the esophagus, which is the tube that connects your mouth to your stomach. Blockages. You may have ulcers, scar tissue, or inflammation that blocks the normal passage of food and liquids. Causes of these problems include: Acid reflux from your stomach into your esophagus (gastroesophageal reflux). Infections. Radiation treatment for cancer. Medicines taken without enough fluids to wash them down into your stomach. Stroke. This can affect the nerves and make it difficult to swallow. Nerve problems. These prevent signals from being sent to the muscles of your esophagus to squeeze (contract) and move what you swallow down to your stomach. Globus pharyngeus. This is a common problem that involves a feeling like something is stuck in your throat or a sense of trouble with swallowing, even though nothing is wrong with the swallowing passages. Certain conditions, such as cerebral palsy or Parkinson's disease. What are the signs or symptoms? Common symptoms of this condition include: A feeling that solids or liquids are stuck in your throat on the way down to the stomach. Pain while swallowing. Coughing or gagging while trying to swallow. Other symptoms include: Food moving back from your stomach to your mouth (regurgitation). Noises coming from your throat. Chest discomfort when swallowing. A feeling of fullness when swallowing. Drooling, especially when the throat is blocked. Heartburn. How is  this diagnosed? This condition may be diagnosed by: Barium swallow X-ray. In this test, you will swallow a white liquid that sticks to the inside of your esophagus. X-ray images are then taken. Endoscopy. In this test, a flexible telescope is inserted down your throat to look at your esophagus and your stomach. CT scans or an MRI. How is this treated? Treatment for dysphagia depends on the cause of this condition: If the dysphagia is caused by acid reflux or infection, medicines may be used. These may include antibiotics or heartburn medicines. If the dysphagia is caused by problems with the muscles, swallowing therapy may be used to help you strengthen your swallowing muscles. You may have to do specific exercises to strengthen the muscles or stretch them. If the dysphagia is caused by a blockage or mass, procedures to remove the blockage may be done. You may need surgery and a feeding tube. You may need to make diet changes. Ask your health care provider for specific instructions. Follow these instructions at home: Medicines Take over-the-counter and prescription medicines only as told by your health care provider. If you were prescribed an antibiotic medicine, take it as told by your health care provider. Do not stop taking the antibiotic even if you start to feel better. Eating and drinking  Make any diet changes as told by your health care provider. Work with a diet and nutrition specialist (dietitian) to create an eating plan that will help you get the nutrients you need in order to stay healthy. Eat soft foods that are easier to swallow. Cut your food into small pieces and eat slowly. Take small bites. Eat and drink only when you are   sitting upright. Do not drink alcohol or caffeine. If you need help quitting, ask your health care provider. General instructions Check your weight every day to make sure you are not losing weight. Do not use any products that contain nicotine or tobacco.  These products include cigarettes, chewing tobacco, and vaping devices, such as e-cigarettes. If you need help quitting, ask your health care provider. Keep all follow-up visits. This is important. Contact a health care provider if: You lose weight because you cannot swallow. You cough when you drink liquids. You cough up partially digested food. Get help right away if: You cannot swallow your saliva. You have shortness of breath, a fever, or both. Your voice is hoarse and you have trouble swallowing. These symptoms may represent a serious problem that is an emergency. Do not wait to see if the symptoms will go away. Get medical help right away. Call your local emergency services (911 in the [place] Do not drive yourself to the hospital. Summary Dysphagia is trouble swallowing. This condition occurs when solids and liquids stick in a person's throat on the way down to the stomach. You may cough or gag while trying to swallow. Dysphagia has many possible causes. Treatment for dysphagia depends on the cause of the condition. Keep all follow-up visits. This is important. This information is not intended to replace advice given to you by your health care provider. Make sure you discuss any questions you have with your health care provider. Document Revised: 08/09/2019 Document Reviewed: 08/09/2019 Elsevier Patient Education  2022 Elsevier Inc.  

## 2020-10-26 LAB — CBC WITH DIFFERENTIAL/PLATELET
Absolute Monocytes: 570 cells/uL (ref 200–950)
Basophils Absolute: 68 cells/uL (ref 0–200)
Basophils Relative: 0.9 %
Eosinophils Absolute: 327 cells/uL (ref 15–500)
Eosinophils Relative: 4.3 %
HCT: 42.7 % (ref 38.5–50.0)
Hemoglobin: 13.5 g/dL (ref 13.2–17.1)
Lymphs Abs: 2683 cells/uL (ref 850–3900)
MCH: 26.5 pg — ABNORMAL LOW (ref 27.0–33.0)
MCHC: 31.6 g/dL — ABNORMAL LOW (ref 32.0–36.0)
MCV: 83.9 fL (ref 80.0–100.0)
MPV: 10.6 fL (ref 7.5–12.5)
Monocytes Relative: 7.5 %
Neutro Abs: 3952 cells/uL (ref 1500–7800)
Neutrophils Relative %: 52 %
Platelets: 262 10*3/uL (ref 140–400)
RBC: 5.09 10*6/uL (ref 4.20–5.80)
RDW: 12.3 % (ref 11.0–15.0)
Total Lymphocyte: 35.3 %
WBC: 7.6 10*3/uL (ref 3.8–10.8)

## 2020-10-26 LAB — COMPLETE METABOLIC PANEL WITH GFR
AG Ratio: 1.7 (calc) (ref 1.0–2.5)
ALT: 14 U/L (ref 9–46)
AST: 15 U/L (ref 10–35)
Albumin: 4.3 g/dL (ref 3.6–5.1)
Alkaline phosphatase (APISO): 98 U/L (ref 35–144)
BUN: 19 mg/dL (ref 7–25)
CO2: 27 mmol/L (ref 20–32)
Calcium: 9.8 mg/dL (ref 8.6–10.3)
Chloride: 105 mmol/L (ref 98–110)
Creat: 1.26 mg/dL (ref 0.70–1.30)
Globulin: 2.6 g/dL (calc) (ref 1.9–3.7)
Glucose, Bld: 91 mg/dL (ref 65–99)
Potassium: 4.6 mmol/L (ref 3.5–5.3)
Sodium: 141 mmol/L (ref 135–146)
Total Bilirubin: 0.3 mg/dL (ref 0.2–1.2)
Total Protein: 6.9 g/dL (ref 6.1–8.1)
eGFR: 66 mL/min/{1.73_m2} (ref >=60)

## 2020-10-26 LAB — TSH: TSH: 0.98 mIU/L (ref 0.40–4.50)

## 2020-10-26 LAB — LIPID PANEL
Cholesterol: 160 mg/dL (ref <200)
HDL: 59 mg/dL (ref >=40)
LDL Cholesterol (Calc): 82 mg/dL (calc)
Non-HDL Cholesterol (Calc): 101 mg/dL (calc) (ref <130)
Total CHOL/HDL Ratio: 2.7 (calc) (ref <5.0)
Triglycerides: 91 mg/dL (ref <150)

## 2020-10-26 LAB — VITAMIN D 25 HYDROXY (VIT D DEFICIENCY, FRACTURES): Vit D, 25-Hydroxy: 49 ng/mL (ref 30–100)

## 2020-11-04 ENCOUNTER — Telehealth: Payer: Self-pay | Admitting: Orthopaedic Surgery

## 2020-11-04 NOTE — Telephone Encounter (Signed)
Sure that's reasonable.

## 2020-11-04 NOTE — Telephone Encounter (Signed)
Spoke with patient re: his forms. Patient is requesting intermittent leave for work for his knee. If agreeable, please advise what to allow (ex. 1 day a week for 4 weeks). Thank you

## 2020-11-04 NOTE — Telephone Encounter (Signed)
yes

## 2020-11-04 NOTE — Telephone Encounter (Signed)
Received FMLA paperwork from patient    Patient asked if [employer] could call him concerning the paperwork before he pays $25.00 because it's the same paperwork as before.    Patient # [phone]

## 2020-11-05 NOTE — Telephone Encounter (Signed)
Form complete and faxed. Copy mailed to patient.

## 2020-11-09 ENCOUNTER — Telehealth: Payer: Self-pay | Admitting: Physical Medicine and Rehabilitation

## 2020-11-09 ENCOUNTER — Ambulatory Visit: Payer: Self-pay

## 2020-11-09 ENCOUNTER — Ambulatory Visit (INDEPENDENT_AMBULATORY_CARE_PROVIDER_SITE_OTHER): Payer: BC Managed Care – PPO | Admitting: Physical Medicine and Rehabilitation

## 2020-11-09 ENCOUNTER — Encounter: Payer: Self-pay | Admitting: Physical Medicine and Rehabilitation

## 2020-11-09 ENCOUNTER — Other Ambulatory Visit: Payer: Self-pay

## 2020-11-09 VITALS — BP 125/86 | HR 84

## 2020-11-09 DIAGNOSIS — M5416 Radiculopathy, lumbar region: Secondary | ICD-10-CM

## 2020-11-09 DIAGNOSIS — M48061 Spinal stenosis, lumbar region without neurogenic claudication: Secondary | ICD-10-CM | POA: Diagnosis not present

## 2020-11-09 MED ORDER — METHYLPREDNISOLONE ACETATE 80 MG/ML IJ SUSP
80.0000 mg | Freq: Once | INTRAMUSCULAR | Status: AC
  Administered 2020-11-09: 80 mg

## 2020-11-09 NOTE — Telephone Encounter (Signed)
Patient not found for mychart. Called to advise that he will need a driver today for his appointment.

## 2020-11-09 NOTE — Progress Notes (Signed)
Pt state lower back pain that travels down his right leg. Pt state standing and bending makes the pain worse. Pt state he takes pain meds to help ease his pain.  Numeric Pain Rating Scale and Functional Assessment Average Pain 8   In the last MONTH (on 0-10 scale) has pain interfered with the following?  1. General activity like being  able to carry out your everyday physical activities such as walking, climbing stairs, carrying groceries, or moving a chair?  Rating(10)   +Driver, -BT, -Dye Allergies.

## 2020-11-09 NOTE — Patient Instructions (Signed)

## 2020-11-09 NOTE — Telephone Encounter (Signed)
Pt called asking if he will need a driver after his 4 pm appt today? He would like a message sent in mychart.

## 2020-11-14 NOTE — Procedures (Signed)
Lumbosacral Transforaminal Epidural Steroid Injection - Sub-Pedicular Approach with Fluoroscopic Guidance  Patient: Brett [name]      Date of Birth: 1960/09/30 MRN: [id number] PCP: Lucky Cowboy, MD      Visit Date: 11/09/2020   Universal Protocol:    Date/Time: 11/09/2020  Consent Given By: the patient  Position: PRONE  Additional Comments: Vital signs were monitored before and after the procedure. Patient was prepped and draped in the usual sterile fashion. The correct patient, procedure, and site was verified.   Injection Procedure Details:   Procedure diagnoses: Lumbar radiculopathy [M54.16]    Meds Administered:  Meds ordered this encounter  Medications   methylPREDNISolone acetate (DEPO-MEDROL) injection 80 mg    Laterality: Right  Location/Site: L5  Needle:5.0 in., 22 ga.  Short bevel or Quincke spinal needle  Needle Placement: Transforaminal  Findings:    -Comments: Excellent flow of contrast along the nerve, nerve root and into the epidural space.  Procedure Details: After squaring off the end-plates to get a true AP view, the C-arm was positioned so that an oblique view of the foramen as noted above was visualized. The target area is just inferior to the "nose of the scotty dog" or sub pedicular. The soft tissues overlying this structure were infiltrated with 2-3 ml. of 1% Lidocaine without Epinephrine.  The spinal needle was inserted toward the target using a "trajectory" view along the fluoroscope beam.  Under AP and lateral visualization, the needle was advanced so it did not puncture dura and was located close the 6 O'Clock position of the pedical in AP tracterory. Biplanar projections were used to confirm position. Aspiration was confirmed to be negative for CSF and/or blood. A 1-2 ml. volume of Isovue-250 was injected and flow of contrast was noted at each level. Radiographs were obtained for documentation purposes.   After attaining the desired  flow of contrast documented above, a 0.5 to 1.0 ml test dose of 0.25% Marcaine was injected into each respective transforaminal space.  The patient was observed for 90 seconds post injection.  After no sensory deficits were reported, and normal lower extremity motor function was noted,   the above injectate was administered so that equal amounts of the injectate were placed at each foramen (level) into the transforaminal epidural space.   Additional Comments:  The patient tolerated the procedure well Dressing: 2 x 2 sterile gauze and Band-Aid    Post-procedure details: Patient was observed during the procedure. Post-procedure instructions were reviewed.  Patient left the clinic in stable condition.

## 2020-11-14 NOTE — Progress Notes (Signed)
Brett [name] - 60 y.o. male MRN [id number]  Date of birth: 1960-08-15  Office Visit Note: Visit Date: 11/09/2020 PCP: Lucky Cowboy, MD Referred by: Lucky Cowboy, MD  Subjective: Chief Complaint  Patient presents with   Lower Back - Pain   Right Leg - Pain   HPI:  Brett [name] is a 60 y.o. male who comes in today at the request of Dr. Glee Arvin for planned Right L5-S1 Lumbar Transforaminal epidural steroid injection with fluoroscopic guidance.  The patient has failed conservative care including home exercise, medications, time and activity modification.  This injection will be diagnostic and hopefully therapeutic.  Please see requesting physician notes for further details and justification.   ROS Otherwise per HPI.  Assessment & Plan: Visit Diagnoses:    ICD-10-CM   1. Lumbar radiculopathy  M54.16 XR C-ARM NO REPORT    Epidural Steroid injection    methylPREDNISolone acetate (DEPO-MEDROL) injection 80 mg    2. Foraminal stenosis of lumbar region  M48.061 XR C-ARM NO REPORT    Epidural Steroid injection    methylPREDNISolone acetate (DEPO-MEDROL) injection 80 mg      Plan: No additional findings.   Meds & Orders:  Meds ordered this encounter  Medications   methylPREDNISolone acetate (DEPO-MEDROL) injection 80 mg    Orders Placed This Encounter  Procedures   XR C-ARM NO REPORT   Epidural Steroid injection    Follow-up: Return if symptoms worsen or fail to improve.   Procedures: No procedures performed  Lumbosacral Transforaminal Epidural Steroid Injection - Sub-Pedicular Approach with Fluoroscopic Guidance  Patient: Brett [name]      Date of Birth: Dec 18, 1960 MRN: [id number] PCP: Lucky Cowboy, MD      Visit Date: 11/09/2020   Universal Protocol:    Date/Time: 11/09/2020  Consent Given By: the patient  Position: PRONE  Additional Comments: Vital signs were monitored before and after the procedure. Patient was prepped and draped in the  usual sterile fashion. The correct patient, procedure, and site was verified.   Injection Procedure Details:   Procedure diagnoses: Lumbar radiculopathy [M54.16]    Meds Administered:  Meds ordered this encounter  Medications   methylPREDNISolone acetate (DEPO-MEDROL) injection 80 mg    Laterality: Right  Location/Site: L5  Needle:5.0 in., 22 ga.  Short bevel or Quincke spinal needle  Needle Placement: Transforaminal  Findings:    -Comments: Excellent flow of contrast along the nerve, nerve root and into the epidural space.  Procedure Details: After squaring off the end-plates to get a true AP view, the C-arm was positioned so that an oblique view of the foramen as noted above was visualized. The target area is just inferior to the "nose of the scotty dog" or sub pedicular. The soft tissues overlying this structure were infiltrated with 2-3 ml. of 1% Lidocaine without Epinephrine.  The spinal needle was inserted toward the target using a "trajectory" view along the fluoroscope beam.  Under AP and lateral visualization, the needle was advanced so it did not puncture dura and was located close the 6 O'Clock position of the pedical in AP tracterory. Biplanar projections were used to confirm position. Aspiration was confirmed to be negative for CSF and/or blood. A 1-2 ml. volume of Isovue-250 was injected and flow of contrast was noted at each level. Radiographs were obtained for documentation purposes.   After attaining the desired flow of contrast documented above, a 0.5 to 1.0 ml test dose of 0.25% Marcaine was injected into each respective transforaminal space.  The patient was observed for 90 seconds post injection.  After no sensory deficits were reported, and normal lower extremity motor function was noted,   the above injectate was administered so that equal amounts of the injectate were placed at each foramen (level) into the transforaminal epidural space.   Additional  Comments:  The patient tolerated the procedure well Dressing: 2 x 2 sterile gauze and Band-Aid    Post-procedure details: Patient was observed during the procedure. Post-procedure instructions were reviewed.  Patient left the clinic in stable condition.     Clinical History: MRI LUMBAR SPINE WITHOUT CONTRAST   TECHNIQUE: Multiplanar, multisequence MR imaging of the lumbar spine was performed. No intravenous contrast was administered.   COMPARISON:  X-ray 01/02/2019   FINDINGS: Segmentation:  Standard.   Alignment:  Physiologic.   Vertebrae:  No fracture, evidence of discitis, or bone lesion.   Conus medullaris and cauda equina: Conus extends to the T12-L1 level. Conus and cauda equina appear normal.   Paraspinal and other soft tissues: Negative.   Disc levels:   T12-L1: Sagittal sequences only. No significant disc protrusion, foraminal stenosis, or canal stenosis.   L1-L2: Mild diffuse disc bulge and mild right sided facet arthropathy without significant foraminal or canal stenosis.   L2-L3: Mild diffuse disc bulge with left foraminal protrusion results in moderate left foraminal stenosis. No canal stenosis.   L3-L4: Mild diffuse disc bulge and mild bilateral facet arthrosis resulting in mild bilateral foraminal stenosis. No canal stenosis.   L4-L5: Diffuse disc bulge and bilateral facet arthrosis resulting in moderate to severe right and mild left foraminal stenosis. No canal stenosis.   L5-S1: Mild bilateral facet arthrosis. No significant disc protrusion, foraminal stenosis, or canal stenosis.   IMPRESSION: 1. Multilevel degenerative changes of the lumbar spine greatest at L4-L5 where there is moderate-to-severe right and mild left foraminal stenosis. 2. Moderate left foraminal stenosis at L2-L3. 3. No significant canal stenosis at any level.     Electronically Signed   By: Duanne Guess D.O.   On: 01/18/2019 13:17     Objective:  VS:  HT:     WT:   BMI:     BP:125/86  HR:84bpm  TEMP: ( )  RESP:  Physical Exam Vitals and nursing note reviewed.  Constitutional:      General: He is not in acute distress.    Appearance: Normal appearance. He is not ill-appearing.  HENT:     Head: Normocephalic and atraumatic.     Right Ear: External ear normal.     Left Ear: External ear normal.     Nose: No congestion.  Eyes:     Extraocular Movements: Extraocular movements intact.  Cardiovascular:     Rate and Rhythm: Normal rate.     Pulses: Normal pulses.  Pulmonary:     Effort: Pulmonary effort is normal. No respiratory distress.  Abdominal:     General: There is no distension.     Palpations: Abdomen is soft.  Musculoskeletal:        General: No tenderness or signs of injury.     Cervical back: Neck supple.     Right lower leg: No edema.     Left lower leg: No edema.     Comments: Patient has good distal strength without clonus.  Skin:    Findings: No erythema or rash.  Neurological:     General: No focal deficit present.     Mental Status: He is alert and oriented to person,  place, and time.     Sensory: No sensory deficit.     Motor: No weakness or abnormal muscle tone.     Coordination: Coordination normal.  Psychiatric:        Mood and Affect: Mood normal.        Behavior: Behavior normal.     Imaging: No results found.

## 2020-11-29 ENCOUNTER — Telehealth: Payer: Self-pay | Admitting: Orthopaedic Surgery

## 2020-11-29 NOTE — Telephone Encounter (Signed)
Placard at front for pick up. I left voicemail for patient advising. 

## 2020-11-29 NOTE — Telephone Encounter (Signed)
6 months

## 2020-11-29 NOTE — Telephone Encounter (Signed)
Pt called stating his handicap placard has been misplaced and he would like to get a replacement. Pt would like a CB to be advised what he needs to do to get this and if Dr.Xu can leave it at the front desk and he come pick it up, he'd like to be updated.   [phone]

## 2020-11-29 NOTE — Telephone Encounter (Signed)
Ok for placard? For how long? 

## 2020-12-02 ENCOUNTER — Telehealth: Payer: Self-pay | Admitting: Orthopaedic Surgery

## 2020-12-02 NOTE — Telephone Encounter (Signed)
Pt called requesting a new new brace. Please call pt at [phone] to pick up a new one.

## 2020-12-03 NOTE — Telephone Encounter (Signed)
Called patient no answer. Can come in for nurse visit and get fitted for brace. Can come before 10:45 or after 1 pm just let me know when he would like to come or add him onto the nurse schedule.

## 2021-01-11 ENCOUNTER — Encounter: Payer: Self-pay | Admitting: Internal Medicine

## 2021-01-11 NOTE — Progress Notes (Signed)
° ° ° ° ° ° ° ° ° ° ° ° ° ° ° ° ° ° ° ° ° ° ° ° ° ° ° ° ° ° ° ° ° ° ° ° ° ° ° ° ° ° ° ° ° ° ° ° ° ° ° ° ° ° ° ° ° ° ° ° ° ° ° ° ° ° ° ° ° ° ° ° ° ° ° ° ° ° ° ° ° ° ° ° ° ° ° ° ° ° ° ° ° ° ° ° ° ° ° ° ° ° ° ° ° ° ° ° ° ° ° ° ° ° ° ° ° ° ° ° ° ° ° ° ° ° ° ° ° ° ° ° ° ° ° ° ° ° ° ° ° ° ° ° °  R  E  Brett [name]  D                             Future Appointments  Date Time Provider Department  01/12/2021 11:30 AM Unk Pinto, MD [facility]  02/15/2021  3:00 PM Leandrew Koyanagi, MD [facility]  07/12/2021  3:00 PM Unk Pinto, MD [facility]    History of Present Illness:       This very nice 61 y.o.male presents for 3 month follow up with HTN, HLD, Pre-Diabetes and Vitamin D Deficiency.        Patient is treated for HTN & BP has been controlled at home. Todays  . Patient has had no complaints of any cardiac type chest pain, palpitations, dyspnea / orthopnea / PND, dizziness, claudication, or dependent edema.       Hyperlipidemia is controlled with diet & meds. Patient denies myalgias or other med SEs. Last Lipids were at goal :  Lab Results  Component Value Date   CHOL 160 10/25/2020   HDL 59 10/25/2020   LDLCALC 82 10/25/2020   TRIG 91 10/25/2020   CHOLHDL 2.7 10/25/2020     Also, the patient has history of T2_NIDDM PreDiabetes and has had no symptoms of reactive hypoglycemia, diabetic polys, paresthesias or visual blurring.  Last A1c was slightly elevated :  Lab Results  Component Value Date   HGBA1C 6.1 (H) 07/12/2020        Further, the patient also has history of Vitamin D Deficiency and supplements vitamin D without any suspected side-effects. Last vitamin D was at goal :   Lab Results  Component Value Date   VD25OH 49 10/25/2020

## 2021-01-12 ENCOUNTER — Ambulatory Visit: Payer: BC Managed Care – PPO | Admitting: Internal Medicine

## 2021-01-12 DIAGNOSIS — R0989 Other specified symptoms and signs involving the circulatory and respiratory systems: Secondary | ICD-10-CM

## 2021-01-12 DIAGNOSIS — E782 Mixed hyperlipidemia: Secondary | ICD-10-CM

## 2021-01-12 DIAGNOSIS — R7309 Other abnormal glucose: Secondary | ICD-10-CM

## 2021-01-12 DIAGNOSIS — Z79899 Other long term (current) drug therapy: Secondary | ICD-10-CM

## 2021-01-12 DIAGNOSIS — E559 Vitamin D deficiency, unspecified: Secondary | ICD-10-CM

## 2021-01-16 ENCOUNTER — Encounter: Payer: Self-pay | Admitting: Emergency Medicine

## 2021-01-16 ENCOUNTER — Other Ambulatory Visit: Payer: Self-pay

## 2021-01-16 ENCOUNTER — Ambulatory Visit
Admission: EM | Admit: 2021-01-16 | Discharge: 2021-01-16 | Disposition: A | Discharge status: Home / Self Care | Payer: BC Managed Care – PPO | Attending: Internal Medicine | Admitting: Internal Medicine

## 2021-01-16 DIAGNOSIS — B029 Zoster without complications: Secondary | ICD-10-CM | POA: Diagnosis not present

## 2021-01-16 MED ORDER — VALACYCLOVIR HCL 1 G PO TABS
1000.0000 mg | ORAL_TABLET | Freq: Three times a day (TID) | ORAL | 0 refills | Status: AC
Start: 2021-01-16 — End: 2021-01-23

## 2021-01-16 NOTE — Discharge Instructions (Signed)
You have shingles of your arm.  You are being treated with antiviral medication called valacyclovir.  Please follow-up with primary care doctor for further evaluation and management.

## 2021-01-16 NOTE — ED Provider Notes (Signed)
[facility]    CSN: [id number] Arrival date & time: 01/16/21  1041      History   Chief Complaint Chief Complaint  Patient presents with   Rash    HPI Brett [name] is a 61 y.o. male.   Patient presents with painful rash to left arm that has been present for approximately 5 days.  Rash is not itchy.  Denies any fevers. He has not used any OTC medications for the rash. Denies any changes to the environment including soaps, lotions, detergents, foods, etc.    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   Status post total left knee replacement 01/24/2019   Overweight (BMI 25.0-29.9) 08/08/2017   Medication management 03/23/2014   Vitamin D deficiency 03/23/2014   Abnormal glucose 03/23/2014   Hyperlipemia 10/31/2006   GERD 10/31/2006    Past Surgical History:  Procedure Laterality Date   ANAL FISSURE REPAIR  02/24/11   CHONDROPLASTY  08/14/2018   Procedure: CHONDROPLASTY;  Surgeon: Leandrew Koyanagi, MD;  Location: [place];  Service: Orthopedics;;   COLONOSCOPY     KNEE ARTHROSCOPY WITH MEDIAL MENISECTOMY Left 08/14/2018   Procedure: LEFT KNEE ARTHROSCOPY WITH PARTIAL MEDIAL MENISCECTOMY;  Surgeon: Leandrew Koyanagi, MD;  Location: [place];  Service: Orthopedics;  Laterality: Left;   KNEE ARTHROSCOPY WITH MEDIAL MENISECTOMY Right 10/18/2018   Procedure: RIGHT KNEE ARTHROSCOPY WITH PARTIAL MEDIAL MENISCECTOMY;  Surgeon: Leandrew Koyanagi, MD;  Location: [place];  Service: Orthopedics;  Laterality: Right;   TOTAL KNEE ARTHROPLASTY Left 01/24/2019   Procedure: LEFT TOTAL KNEE  ARTHROPLASTY;  Surgeon: Leandrew Koyanagi, MD;  Location: [place];  Service: Orthopedics;  Laterality: Left;   TOTAL KNEE ARTHROPLASTY Right 08/04/2019   Procedure: RIGHT TOTAL KNEE ARTHROPLASTY;  Surgeon: Leandrew Koyanagi, MD;  Location: [place];  Service: Orthopedics;  Laterality: Right;       Home Medications    Prior to Admission medications   Medication Sig Start Date End Date Taking? Authorizing Provider  blood glucose meter kit and supplies Dispense based insurance preference. E11.22 CHECK BLOOD SUGAR ONCE DAILY 09/25/19  Yes McClanahan, Kyra, NP  Cholecalciferol (VITAMIN D-3) 125 MCG (5000 UT) TABS Take 5,000 Units by mouth daily.   Yes [provider]  Continuous Blood Gluc Receiver (FREESTYLE LIBRE 14 DAY READER) DEVI 1 Device by Does not apply route every 14 (fourteen) days. 08/26/19  Yes McClanahan, Danton Sewer, NP  diclofenac (VOLTAREN) 75 MG EC tablet Take 1 tablet (75 mg total) by mouth 2 (two) times daily as needed. 07/08/20  Yes Aundra Dubin, PA-C  gabapentin (NEURONTIN) 600 MG tablet Take  1 tablet  3 to 4 x /day  for Severe Pain 03/18/20  Yes Unk Pinto, MD  glucose blood test strip Test blood sugar once daily 03/24/20  Yes McClanahan, Danton Sewer, NP  ibuprofen (ADVIL) 800 MG tablet Take  1 tablet   3 x /day (every 8 hours)   as needed for Pain & Inflammation 06/11/20  Yes Mcarthur Rossetti, MD  Melatonin 10  MG TABS Take 10 mg by mouth at bedtime as needed (sleep).   Yes [provider]  methocarbamol (ROBAXIN) 750 MG tablet Take 1 tablet (750 mg total) by mouth 2 (two) times daily as needed for muscle spasms. 09/16/19  Yes Leandrew Koyanagi, MD  promethazine (PHENERGAN) 12.5 MG tablet Take 1 tablet (12.5 mg total) by mouth every 6 (six) hours as needed for nausea or vomiting. 08/08/19  Yes Aundra Dubin, PA-C  sulfamethoxazole-trimethoprim (BACTRIM DS) 800-160 MG tablet Take  1 tablet  2 x /day  with Meals  for 3 weeks  for Prostate Infection Patient taking differently: Take   1 tablet  2 x /day  with Meals  for 3 weeks  for Prostate Infection 07/14/20  Yes Unk Pinto, MD  traMADol (ULTRAM) 50 MG tablet Take 1 tablet (50 mg total) by mouth 2 (two) times daily as needed. 03/17/20  Yes Aundra Dubin, PA-C  valACYclovir (VALTREX) 1000 MG tablet Take 1 tablet (1,000 mg total) by mouth 3 (three) times daily for 7 days. 01/16/21 01/23/21 Yes Niana Martorana, Michele Rockers, FNP  sildenafil (VIAGRA) 100 MG tablet Take 1 tablet (100 mg total) by mouth as needed for erectile dysfunction. 10/13/19 10/12/20  Garnet Sierras, NP    Family History Family History  Problem Relation Age of Onset   Rheum arthritis Mother    Heart disease Mother    Diabetes Mother    Arthritis Mother    Diabetes Father    Blindness Father        from DM?   Allergies Daughter    Allergies Sister    Asthma Daughter     Social History Social History   Tobacco Use   Smoking status: Former    Packs/day: 0.25    Years: 27.00    Pack years: 6.75    Types: Cigarettes    Quit date: 08/20/2018    Years since quitting: 2.4   Smokeless tobacco: Never  Vaping Use   Vaping Use: Never used  Substance Use Topics   Alcohol use: Yes    Comment: socially   Drug use: No     Allergies   Benzodiazepines, Chantix  [varenicline], Cialis [tadalafil], Dicyclomine, Meloxicam, Penicillins, and Relafen [nabumetone]   Review of Systems Review of Systems Per HPI  Physical Exam Triage Vital Signs ED Triage Vitals  Enc Vitals Group     BP 01/16/21 1127 136/87     Pulse Rate 01/16/21 1127 88     Resp 01/16/21 1127 18     Temp 01/16/21 1127 98.1 F (36.7 C)     Temp Source 01/16/21 1127 Oral     SpO2 01/16/21 1127 96 %     Weight 01/16/21 1129 167 lb (75.8 kg)     Height 01/16/21 1129 _0  (1.651 m)     Head Circumference --      Peak Flow --      Pain Score 01/16/21 1129 9     Pain Loc --      Pain Edu? --      Excl. in [place]? --    No data found.  Updated Vital Signs BP 136/87 (BP Location: Right  Arm)    Pulse 88    Temp 98.1 F (36.7 C) (Oral)    Resp 18    Ht _1  (1.651 m)    Wt 167 lb (75.8 kg)    SpO2 96%    BMI 27.79 kg/m   Visual Acuity Right  Eye Distance:   Left Eye Distance:   Bilateral Distance:    Right Eye Near:   Left Eye Near:    Bilateral Near:     Physical Exam Constitutional:      General: He is not in acute distress.    Appearance: Normal appearance. He is not toxic-appearing or diaphoretic.  HENT:     Head: Normocephalic and atraumatic.  Eyes:     Extraocular Movements: Extraocular movements intact.     Conjunctiva/sclera: Conjunctivae normal.  Pulmonary:     Effort: Pulmonary effort is normal.  Skin:    General: Skin is warm and dry.     Findings: Rash present.     Comments: Patient has multiple groupings of circular erythematous rash present to various areas of left upper extremity that seem to follow a specific dermatome.  Neurological:     General: No focal deficit present.     Mental Status: He is alert and oriented to person, place, and time. Mental status is at baseline.  Psychiatric:        Mood and Affect: Mood normal.        Behavior: Behavior normal.        Thought Content: Thought content normal.        Judgment: Judgment normal.     UC Treatments / Results  Labs (all labs ordered are listed, but only abnormal results are displayed) Labs Reviewed - No data to display  EKG   Radiology No results found.  Procedures Procedures (including critical care time)  Medications Ordered in UC Medications - No data to display  Initial Impression / Assessment and Plan / UC Course  I have reviewed the triage vital signs and the nursing notes.  Pertinent labs & imaging results that were available during my care of the patient were reviewed by me and considered in my medical decision making (see chart for details).     Rash is consistent with herpes zoster.  Will prescribe Valtrex.  Patient already takes gabapentin.  No obvious  complications noted at this time.  Discussed strict return precautions.  Patient to follow-up with PCP for further evaluation.  Patient verbalized understanding and was agreeable with plan. Final Clinical Impressions(s) / UC Diagnoses   Final diagnoses:  Herpes zoster without complication     Discharge Instructions      You have shingles of your arm.  You are being treated with antiviral medication called valacyclovir.  Please follow-up with primary care doctor for further evaluation and management.    ED Prescriptions     Medication Sig Dispense Auth. Provider   valACYclovir (VALTREX) 1000 MG tablet Take 1 tablet (1,000 mg total) by mouth 3 (three) times daily for 7 days. 21 tablet [place], Michele Rockers, [place]      PDMP not reviewed this encounter.   Teodora Medici, [place] 01/16/21 1235

## 2021-01-16 NOTE — ED Triage Notes (Signed)
Patient c/o a rash on his left arm x 5 days, making his left arm really sore.  No itching just an ache.

## 2021-01-25 ENCOUNTER — Other Ambulatory Visit: Payer: Self-pay

## 2021-01-25 ENCOUNTER — Ambulatory Visit (INDEPENDENT_AMBULATORY_CARE_PROVIDER_SITE_OTHER): Payer: BC Managed Care – PPO | Admitting: Internal Medicine

## 2021-01-25 ENCOUNTER — Encounter: Payer: Self-pay | Admitting: Internal Medicine

## 2021-01-25 VITALS — BP 140/90 | HR 83 | Temp 97.9°F | Resp 16 | Ht 65.0 in | Wt 170.0 lb

## 2021-01-25 DIAGNOSIS — E782 Mixed hyperlipidemia: Secondary | ICD-10-CM | POA: Diagnosis not present

## 2021-01-25 DIAGNOSIS — E559 Vitamin D deficiency, unspecified: Secondary | ICD-10-CM

## 2021-01-25 DIAGNOSIS — B0229 Other postherpetic nervous system involvement: Secondary | ICD-10-CM

## 2021-01-25 DIAGNOSIS — R0989 Other specified symptoms and signs involving the circulatory and respiratory systems: Secondary | ICD-10-CM | POA: Diagnosis not present

## 2021-01-25 DIAGNOSIS — Z79899 Other long term (current) drug therapy: Secondary | ICD-10-CM

## 2021-01-25 DIAGNOSIS — R7309 Other abnormal glucose: Secondary | ICD-10-CM

## 2021-01-25 MED ORDER — GABAPENTIN 800 MG PO TABS: ORAL_TABLET | ORAL | 0 refills | Status: DC

## 2021-01-25 MED ORDER — DEXAMETHASONE 4 MG PO TABS: ORAL_TABLET | ORAL | 0 refills | Status: DC

## 2021-01-25 NOTE — Progress Notes (Signed)
Future Appointments  Date Time Provider Department  01/25/2021  4:00 PM Unk Pinto, MD [facility]  02/15/2021  3:00 PM Leandrew Koyanagi, MD [facility]  07/12/2021  3:00 PM Unk Pinto, MD [facility]    History of Present Illness:           This very nice 61 y.o.male presents for 6 month follow up with HTN, HLD, Pre-Diabetes and Vitamin D Deficiency.  Patient was seen about 1 week ago at an urgent care & teated for Shingles of the LUE with Valtrex & is still c/o radicular pains.         Patient is treated for HTN & BP has been controlled at home. Todays BP is at goal -  140/90. Patient has had no complaints of any cardiac type chest pain, palpitations, dyspnea / orthopnea / PND, dizziness, claudication, or dependent edema.       Hyperlipidemia is controlled with diet & meds. Patient denies myalgias or other med SEs. Last Lipids were at goal :  Lab Results  Component Value Date   CHOL 160 10/25/2020   HDL 59 10/25/2020   LDLCALC 82 10/25/2020   TRIG 91 10/25/2020   CHOLHDL 2.7 10/25/2020     Also, the patient has history of PreDiabetes ( A1c 5.7%  /2015 and A1c 6.2%  /Jan 2021) and has had no symptoms of reactive hypoglycemia, diabetic polys, paresthesias or visual blurring.  Last A1c was   Lab Results  Component Value Date   HGBA1C 6.1 (H) 07/12/2020        Further, the patient also has history of Vitamin D Deficiency ("32"/ 2015) and supplements vitamin D without any suspected side-effects. Last vitamin D was   Lab Results  Component Value Date   VD25OH 49 10/25/2020     Current Outpatient Medications on File Prior to Visit  Medication Sig   blood glucose meter kit and supplies Dispense based insurance preference. E11.22 CHECK BLOOD SUGAR ONCE DAILY   Cholecalciferol (VITAMIN D-3) 125 MCG (5000 UT) TABS Take 5,000 Units by mouth daily.   Continuous Blood Gluc Receiver (FREESTYLE LIBRE 14 DAY READER) DEVI 1 Device by Does not apply route every 14 (fourteen)  days.   gabapentin (NEURONTIN) 600 MG tablet Take  1 tablet  3 to 4 x /day  for Severe Pain   glucose blood test strip Test blood sugar once daily   ibuprofen (ADVIL) 800 MG tablet Take  1 tablet   3 x /day (every 8 hours)   as needed for Pain & Inflammation   methocarbamol (ROBAXIN) 750 MG tablet Take 1 tablet (750 mg total) by mouth 2 (two) times daily as needed for muscle spasms.   promethazine (PHENERGAN) 12.5 MG tablet Take 1 tablet (12.5 mg total) by mouth every 6 (six) hours as needed for nausea or vomiting.   traMADol (ULTRAM) 50 MG tablet Take 1 tablet (50 mg total) by mouth 2 (two) times daily as needed.   diclofenac (VOLTAREN) 75 MG EC tablet Take 1 tablet (75 mg total) by mouth 2 (two) times daily as needed. (Patient not taking: Reported on 01/25/2021)   Melatonin 10 MG TABS Take 10 mg by mouth at bedtime as needed (sleep). (Patient not taking: Reported on 01/25/2021)   sildenafil (VIAGRA) 100 MG tablet Take 1 tablet (100 mg total) by mouth as needed for erectile dysfunction.   sulfamethoxazole-trimethoprim (BACTRIM DS) 800-160 MG tablet Take  1 tablet  2 x /day  with Meals  for 3 weeks  for Prostate Infection (Patient not taking: Reported on 01/25/2021)   No current facility-administered medications on file prior to visit.     Allergies  Allergen Reactions   Benzodiazepines Hives   Chantix  [Varenicline] Hives   Cialis [Tadalafil]     Hives   Dicyclomine Hives   Meloxicam     Rash   Penicillins Hives    TOLERATED ANCEF 08/04/2019.  DID THE REACTION INVOLVE: Swelling of the face/tongue/throat, SOB, or low BP? Unknown Sudden or severe rash/hives, skin peeling, or the inside of the mouth or nose? Unknown Did it require medical treatment? n  When did it last happen? Over 5 years ago.  If all above answers are "NO", may proceed with cephalosporin use.    Relafen [Nabumetone]     Hives     PMHx:   Past Medical History:  Diagnosis Date   Allergy    albuterol as needed    Anal fissure    Arthritis    Colon polyps 01/13/11   Colonoscopy   GERD (gastroesophageal reflux disease)    Pneumonia    Pre-diabetes      Immunization History  Administered Date(s) Administered   Influenza Inj Mdck Quad With Preservative 10/18/2017   Influenza Split 10/13/2019   Influenza Whole 10/31/2006, 11/03/2010, 11/02/2011   Influenza,inj,Quad PF,6+ Mos 09/12/2018, 10/25/2020   Influenza-Unspecified 01/03/2012   PFIZER(Purple Top)SARS-COV-2 Vaccination 03/25/2019, 04/15/2019, 11/25/2019, 03/09/2020   PPD Test 03/17/2013, 07/13/2020   Pneumococcal-Unspecified 10/13/2009   Td 01/03/2008   Tdap 01/03/2011     Past Surgical History:  Procedure Laterality Date   ANAL FISSURE REPAIR  02/24/11   CHONDROPLASTY  08/14/2018   Procedure: CHONDROPLASTY;  Surgeon: Leandrew Koyanagi, MD;  Location: [facility];  Service: Orthopedics;;   COLONOSCOPY     KNEE ARTHROSCOPY WITH MEDIAL MENISECTOMY Left 08/14/2018   Procedure: LEFT KNEE ARTHROSCOPY WITH PARTIAL MEDIAL MENISCECTOMY;  Surgeon: Leandrew Koyanagi, MD;  Location: [facility];  Service: Orthopedics;  Laterality: Left;   KNEE ARTHROSCOPY WITH MEDIAL MENISECTOMY Right 10/18/2018   Procedure: RIGHT KNEE ARTHROSCOPY WITH PARTIAL MEDIAL MENISCECTOMY;  Surgeon: Leandrew Koyanagi, MD;  Location: [facility];  Service: Orthopedics;  Laterality: Right;   TOTAL KNEE ARTHROPLASTY Left 01/24/2019   Procedure: LEFT TOTAL KNEE ARTHROPLASTY;  Surgeon: Leandrew Koyanagi, MD;  Location: [facility];  Service: Orthopedics;  Laterality: Left;   TOTAL KNEE ARTHROPLASTY Right 08/04/2019   Procedure: RIGHT TOTAL KNEE ARTHROPLASTY;  Surgeon: Leandrew Koyanagi, MD;  Location: [facility];  Service: Orthopedics;  Laterality: Right;    FHx:    Reviewed / unchanged  SHx:    Reviewed / unchanged   Systems Review:  Constitutional: Denies fever, chills, wt changes, headaches, insomnia, fatigue, night sweats, change in appetite. Eyes: Denies  redness, blurred vision, diplopia, discharge, itchy, watery eyes.  ENT: Denies discharge, congestion, post nasal drip, epistaxis, sore throat, earache, hearing loss, dental pain, tinnitus, vertigo, sinus pain, snoring.  CV: Denies chest pain, palpitations, irregular heartbeat, syncope, dyspnea, diaphoresis, orthopnea, PND, claudication or edema. Respiratory: denies cough, dyspnea, DOE, pleurisy, hoarseness, laryngitis, wheezing.  Gastrointestinal: Denies dysphagia, odynophagia, heartburn, reflux, water brash, abdominal pain or cramps, nausea, vomiting, bloating, diarrhea, constipation, hematemesis, melena, hematochezia  or hemorrhoids. Genitourinary: Denies dysuria, frequency, urgency, nocturia, hesitancy, discharge, hematuria or flank pain. Musculoskeletal: Denies arthralgias, myalgias, stiffness, jt. swelling, pain, limping or strain/sprain.  Skin: Denies pruritus, rash, hives, warts, acne, eczema or change in skin lesion(s). Neuro: No weakness, tremor,  incoordination, spasms, paresthesia or pain. Psychiatric: Denies confusion, memory loss or sensory loss. Endo: Denies change in weight, skin or hair change.  Heme/Lymph: No excessive bleeding, bruising or enlarged lymph nodes.  Physical Exam  BP 140/90    Pulse 83    Temp 97.9 F (36.6 C)    Resp 16    Ht '5\' 5"'  (1.651 m)    Wt 170 lb (77.1 kg)    SpO2 95%    BMI 28.29 kg/m   Appears  well nourished, well groomed  and in no distress.  Eyes: PERRLA, EOMs, conjunctiva no swelling or erythema. Sinuses: No frontal/maxillary tenderness ENT/Mouth: EAC's clear, TM's nl w/o erythema, bulging. Nares clear w/o erythema, swelling, exudates. Oropharynx clear without erythema or exudates. Oral hygiene is good. Tongue normal, non obstructing. Hearing intact.  Neck: Supple. Thyroid not palpable. Car 2+/2+ without bruits, nodes or JVD. Chest: Respirations nl with BS clear & equal w/o rales, rhonchi, wheezing or stridor.  Cor: Heart sounds normal w/ regular  rate and rhythm without sig. murmurs, gallops, clicks or rubs. Peripheral pulses normal and equal  without edema.  Abdomen: Soft & bowel sounds normal. Non-tender w/o guarding, rebound, hernias, masses or organomegaly.  Lymphatics: Unremarkable.  Musculoskeletal: Full ROM all peripheral extremities, joint stability, 5/5 strength and normal gait.  Skin: Warm, dry without exposed rashes, lesions or ecchymosis apparent.  Neuro: Cranial nerves intact, reflexes equal bilaterally. Sensory-motor testing grossly intact. Tendon reflexes grossly intact.  Pysch: Alert & oriented x 3.  Insight and judgement nl & appropriate. No ideations.  Assessment and Plan:  1. Labile hypertension  - Continue medication, monitor blood pressure at home.  - Continue DASH diet.  Reminder to go to the ER if any CP,  SOB, nausea, dizziness, severe HA, changes vision/speech.   - CBC with Differential/Platelet - COMPLETE METABOLIC PANEL WITH GFR - Magnesium - TSH  2. Hyperlipidemia, mixed  - Continue diet/meds, exercise,& lifestyle modifications.  - Continue monitor periodic cholesterol/liver & renal functions    - Lipid panel - TSH  3. Abnormal glucose  - Continue diet, exercise  - Lifestyle modifications.  - Monitor appropriate labs   - Hemoglobin A1c - Insulin, random  4. Vitamin D deficiency  - Continue supplementation   - VITAMIN D 25 Hydroxy   5. Post herpetic neuralgia  - Increase dose of Gabapentin 800 mg 3-4 x /day,  recommended take Tylenol 1,000 mg qid and  also Rx 1 week steroid taper.    6. Medication management  - CBC with Differential/Platelet - COMPLETE METABOLIC PANEL WITH GFR - Magnesium - Lipid panel - TSH - Hemoglobin A1c - Insulin, random - VITAMIN D 25 Hydroxy          Discussed  regular exercise, BP monitoring, weight control to achieve/maintain BMI less than 25 and discussed med and SE's. Recommended labs to assess and monitor clinical status with further  disposition pending results of labs.  I discussed the assessment and treatment plan with the patient. The patient was provided an opportunity to ask questions and all were answered. The patient agreed with the plan and demonstrated an understanding of the instructions.  I provided over 30 minutes of exam, counseling, chart review and  complex critical decision making.        The patient was advised to call back or seek an in-person evaluation if the symptoms worsen or if the condition fails to improve as anticipated.   Kirtland Bouchard, MD

## 2021-01-25 NOTE — Patient Instructions (Signed)

## 2021-01-26 LAB — CBC WITH DIFFERENTIAL/PLATELET
Absolute Monocytes: 524 cells/uL (ref 200–950)
Basophils Absolute: 68 cells/uL (ref 0–200)
Basophils Relative: 1 %
Eosinophils Absolute: 197 cells/uL (ref 15–500)
Eosinophils Relative: 2.9 %
HCT: 43.7 % (ref 38.5–50.0)
Hemoglobin: 13.8 g/dL (ref 13.2–17.1)
Lymphs Abs: 3427 cells/uL (ref 850–3900)
MCH: 26.5 pg — ABNORMAL LOW (ref 27.0–33.0)
MCHC: 31.6 g/dL — ABNORMAL LOW (ref 32.0–36.0)
MCV: 84 fL (ref 80.0–100.0)
MPV: 10.4 fL (ref 7.5–12.5)
Monocytes Relative: 7.7 %
Neutro Abs: 2584 cells/uL (ref 1500–7800)
Neutrophils Relative %: 38 %
Platelets: 277 10*3/uL (ref 140–400)
RBC: 5.2 10*6/uL (ref 4.20–5.80)
RDW: 13.1 % (ref 11.0–15.0)
Total Lymphocyte: 50.4 %
WBC: 6.8 10*3/uL (ref 3.8–10.8)

## 2021-01-26 LAB — LIPID PANEL
Cholesterol: 171 mg/dL (ref <200)
HDL: 57 mg/dL (ref >=40)
LDL Cholesterol (Calc): 98 mg/dL (calc)
Non-HDL Cholesterol (Calc): 114 mg/dL (calc) (ref <130)
Total CHOL/HDL Ratio: 3 (calc) (ref <5.0)
Triglycerides: 75 mg/dL (ref <150)

## 2021-01-26 LAB — COMPLETE METABOLIC PANEL WITH GFR
AG Ratio: 1.6 (calc) (ref 1.0–2.5)
ALT: 18 U/L (ref 9–46)
AST: 19 U/L (ref 10–35)
Albumin: 4.5 g/dL (ref 3.6–5.1)
Alkaline phosphatase (APISO): 103 U/L (ref 35–144)
BUN: 17 mg/dL (ref 7–25)
CO2: 27 mmol/L (ref 20–32)
Calcium: 9.7 mg/dL (ref 8.6–10.3)
Chloride: 106 mmol/L (ref 98–110)
Creat: 1.21 mg/dL (ref 0.70–1.35)
Globulin: 2.8 g/dL (calc) (ref 1.9–3.7)
Glucose, Bld: 85 mg/dL (ref 65–99)
Potassium: 4.4 mmol/L (ref 3.5–5.3)
Sodium: 141 mmol/L (ref 135–146)
Total Bilirubin: 0.3 mg/dL (ref 0.2–1.2)
Total Protein: 7.3 g/dL (ref 6.1–8.1)
eGFR: 69 mL/min/{1.73_m2} (ref >=60)

## 2021-01-26 LAB — HEMOGLOBIN A1C
Hgb A1c MFr Bld: 6.1 % of total Hgb — ABNORMAL HIGH (ref <5.7)
Mean Plasma Glucose: 128 mg/dL
eAG (mmol/L): 7.1 mmol/L

## 2021-01-26 LAB — TSH: TSH: 2.53 mIU/L (ref 0.40–4.50)

## 2021-01-26 LAB — VITAMIN D 25 HYDROXY (VIT D DEFICIENCY, FRACTURES): Vit D, 25-Hydroxy: 34 ng/mL (ref 30–100)

## 2021-01-26 LAB — INSULIN, RANDOM: Insulin: 10.3 u[IU]/mL

## 2021-01-26 LAB — MAGNESIUM: Magnesium: 2 mg/dL (ref 1.5–2.5)

## 2021-01-26 NOTE — Progress Notes (Signed)
============================================================ °-   Test results slightly outside the reference range are not unusual. If there is anything important, I will review this with you,  otherwise it is considered normal test values.  If you have further questions,  please do not hesitate to contact me at the office or via My Chart.  ============================================================ ============================================================  -  A1c = 6.1%  Blood sugar and A1c are STILL elevated in the borderline and  early or pre-diabetes range which has the same   300% increased risk for heart attack, stroke, cancer and   alzheimer- type vascular dementia as full blown diabetes.   But the good news is that diet, exercise with  weight loss can cure the early diabetes at this point. ============================================================ ============================================================  - It is very important that you work harder with diet by  avoiding all foods that are white except chicken,   fish & calliflower.  - Avoid white rice  (brown & wild rice is OK),   - Avoid white potatoes  (sweet potatoes in moderation is OK),   White bread or wheat bread or anything made out of   white flour like bagels, donuts, rolls, buns, biscuits, cakes,  - pastries, cookies, pizza crust, and pasta (made from  white flour & egg whites)   - vegetarian pasta or spinach or wheat pasta is OK.  - Multigrain breads like Arnold's, Pepperidge Farm or   multigrain sandwich thins or high fiber breads like   E[place]ka bread or "Dave's Killer" breads that are  4 to 5 grams fiber per slice !  are best.    Diet, exercise and weight loss can reverse and cure  diabetes in the early stages.   ============================================================ ============================================================  -  Total Chol = 171    &   LDL Chol = 98   - Both   Excellent   - Very low risk for Heart Attack  / Stroke ============================================================ ============================================================  -  Vitamin D = 34 - BAD  ! -   Way too Low  !  Recommend that you increase to 2 capsules = 10,000 units EVERY day  ============================================================ ============================================================  -  All Else - CBC - Kidneys - Electrolytes - Liver - Magnesium & Thyroid    - all  Normal / OK ============================================================ ============================================================

## 2021-02-09 ENCOUNTER — Other Ambulatory Visit: Payer: Self-pay

## 2021-02-09 ENCOUNTER — Ambulatory Visit (INDEPENDENT_AMBULATORY_CARE_PROVIDER_SITE_OTHER): Payer: BC Managed Care – PPO | Admitting: Internal Medicine

## 2021-02-09 ENCOUNTER — Encounter: Payer: Self-pay | Admitting: Internal Medicine

## 2021-02-09 VITALS — BP 118/74 | HR 87 | Temp 97.9°F | Ht 65.0 in | Wt 169.0 lb

## 2021-02-09 DIAGNOSIS — M7918 Myalgia, other site: Secondary | ICD-10-CM | POA: Diagnosis not present

## 2021-02-09 MED ORDER — METHOCARBAMOL 750 MG PO TABS: ORAL_TABLET | ORAL | 1 refills | Status: DC

## 2021-02-09 NOTE — Progress Notes (Signed)
° ° °  Future Appointments  Date Time Provider Department  02/09/2021  4:00 PM Lucky Cowboy, MD [facility]  02/15/2021  3:00 PM Tarry Kos, MD [facility]  07/12/2021  3:00 PM Lucky Cowboy, MD [facility]    History of Present Illness:             This very nice 61 y.o. MBM with labile HTN, HLD,  Prediabetes and Vitamin D Deficiency who was seen 2 weeks ago  with shingles pain of the LUE treated 1 week previous at an urgent care with only Valtrex. Patient was treated with a Decadron pulse /taper & Gabapentin & returns today for F/U.     Patient reports pain from shingles of LUE has resolved. But today he reports he's having "muscle spasms" in his neck & back & request srefill of his Robaxin.    Medications    sildenafil (VIAGRA) 100 MG tablet, Take 1 tablet (100 mg total) by mouth as needed for erectile dysfunction.   promethazine (PHENERGAN) 12.5 MG tablet, Take 1 tablet (12.5 mg total) by mouth every 6 (six) hours as needed for nausea or vomiting.   ibuprofen (ADVIL) 800 MG tablet, Take  1 tablet   3 x /day (every 8 hours)   as needed for Pain & Inflammation   traMADol (ULTRAM) 50 MG tablet, Take 1 tablet (50 mg total) by mouth 2 (two) times daily as needed.   Cholecalciferol (VITAMIN D-3) 125 MCG (5000 UT) TABS, Take 5,000 Units by mouth daily.   gabapentin (NEURONTIN) 800 MG tablet, Take  1 tablet  3 to 4 x /day  for Severe Pain   methocarbamol (ROBAXIN) 750 MG tablet, Take 1 tablet (750 mg total) by mouth 2 (two) times daily as needed for muscle spasms.  Problem list He has Hyperlipemia; GERD; Medication management; Vitamin D deficiency; Abnormal glucose; Overweight (BMI 25.0-29.9); Status post total left knee replacement; Status post total knee replacement, right; and Status post total right knee replacement on their problem list.   Observations/Objective:  BP 118/74    Pulse 87    Temp 97.9 F (36.6 C)    Ht 5\' 5"  (1.651 m)    Wt 169 lb (76.7 kg)    SpO2 99%    BMI 28.12 kg/m    HEENT - WNL. Neck - supple.  Chest - Clear equal BS. Cor - Nl HS. RRR w/o sig MGR. PP 1(+). No edema. MS- FROM w/o deformities.  Gait Nl. Neuro -  Nl w/o focal abnormalities.   Assessment and Plan:  1. Myalgia of muscle of neck   Follow Up Instructions:       I discussed the assessment and treatment plan with the patient. The patient was provided an opportunity to ask questions and all were answered. The patient agreed with the plan and demonstrated an understanding of the instructions.       The patient was advised to call back or seek an in-person evaluation if the symptoms worsen or if the condition fails to improve as anticipated.   , MD

## 2021-02-15 ENCOUNTER — Ambulatory Visit: Payer: Self-pay | Admitting: Orthopaedic Surgery

## 2021-02-22 ENCOUNTER — Other Ambulatory Visit: Payer: Self-pay

## 2021-02-22 ENCOUNTER — Ambulatory Visit: Payer: Self-pay

## 2021-02-22 ENCOUNTER — Ambulatory Visit (INDEPENDENT_AMBULATORY_CARE_PROVIDER_SITE_OTHER): Payer: BC Managed Care – PPO | Admitting: Orthopaedic Surgery

## 2021-02-22 ENCOUNTER — Encounter: Payer: Self-pay | Admitting: Orthopaedic Surgery

## 2021-02-22 DIAGNOSIS — Z96653 Presence of artificial knee joint, bilateral: Secondary | ICD-10-CM

## 2021-02-22 DIAGNOSIS — Z96651 Presence of right artificial knee joint: Secondary | ICD-10-CM | POA: Diagnosis not present

## 2021-02-22 DIAGNOSIS — Z96652 Presence of left artificial knee joint: Secondary | ICD-10-CM

## 2021-02-22 NOTE — Progress Notes (Signed)
Post-Op Visit Note   Patient: Brett [name]           Date of Birth: 1960/03/09           MRN: [id number] Visit Date: 02/22/2021 PCP: Unk Pinto, MD   Assessment & Plan:  Chief Complaint:  Chief Complaint  Patient presents with   Right Knee - Pain   Left Knee - Pain   Visit Diagnoses:  1. H/O total knee replacement, right   2. H/O total knee replacement, left     Plan: Patient is a pleasant 61 year old gentleman who comes in today for follow-up of bilateral total knee replacements left total knee 01/24/2019 and right total knee 08/04/2019.  He is still complaining of pain to both knees but also of instability to the left knee.  He was seen in our office back in October for the left knee where he was provided with a hinged knee brace.  He notes that this does provide some relief and he is unable to work without.  Examination of the right knee reveals range of motion from 0 to 120 degrees.  He is stable valgus varus stress.  He is neurovascular intact distally.  Left knee exam shows range of motion 0 to 120 degrees.  He does have increased laxity and pain with varus stress.  Valgus stress is stable.  He has slight tenderness to the lateral joint line.  He is neurovascular intact distally.  At this point, we again given the option to continue with a hinged knee brace or undergo revision surgery with poly exchange.  He would like to think about this but continue with the hinged knee brace for now.  He will follow-up with [place] in 6 months time for repeat evaluation and 2 view x-rays of both knees.  Call with concerns or questions in the meantime.  Total face to face encounter time was greater than 25 minutes and over half of this time was spent in counseling and/or coordination of care.   Follow-Up Instructions: Return in about 6 months (around 08/22/2021).   Orders:  Orders Placed This Encounter  Procedures   XR Knee 1-2 Views Right   XR Knee 1-2 Views Left   No orders of the  defined types were placed in this encounter.   Imaging: XR Knee 1-2 Views Left  Result Date: 02/22/2021 Well-seated prosthesis without complication  XR Knee 1-2 Views Right  Result Date: 02/22/2021 Well-seated prosthesis without complication   PMFS History: Patient Active Problem List   Diagnosis Date Noted   Status post total right knee replacement 10/19/2020   Status post total knee replacement, right 08/04/2019   Status post total left knee replacement 01/24/2019   Overweight (BMI 25.0-29.9) 08/08/2017   Medication management 03/23/2014   Vitamin D deficiency 03/23/2014   Abnormal glucose 03/23/2014   Hyperlipemia 10/31/2006   GERD 10/31/2006   Past Medical History:  Diagnosis Date   Allergy    albuterol as needed   Anal fissure    Arthritis    Colon polyps 01/13/11   Colonoscopy   GERD (gastroesophageal reflux disease)    Pneumonia    Pre-diabetes     Family History  Problem Relation Age of Onset   Rheum arthritis Mother    Heart disease Mother    Diabetes Mother    Arthritis Mother    Diabetes Father    Blindness Father        from DM?   Allergies Daughter    Allergies  Sister    Asthma Daughter     Past Surgical History:  Procedure Laterality Date   ANAL FISSURE REPAIR  02/24/11   CHONDROPLASTY  08/14/2018   Procedure: CHONDROPLASTY;  Surgeon: Leandrew Koyanagi, MD;  Location: [facility];  Service: Orthopedics;;   COLONOSCOPY     KNEE ARTHROSCOPY WITH MEDIAL MENISECTOMY Left 08/14/2018   Procedure: LEFT KNEE ARTHROSCOPY WITH PARTIAL MEDIAL MENISCECTOMY;  Surgeon: Leandrew Koyanagi, MD;  Location: [facility];  Service: Orthopedics;  Laterality: Left;   KNEE ARTHROSCOPY WITH MEDIAL MENISECTOMY Right 10/18/2018   Procedure: RIGHT KNEE ARTHROSCOPY WITH PARTIAL MEDIAL MENISCECTOMY;  Surgeon: Leandrew Koyanagi, MD;  Location: [facility];  Service: Orthopedics;  Laterality: Right;   TOTAL KNEE ARTHROPLASTY Left 01/24/2019    Procedure: LEFT TOTAL KNEE ARTHROPLASTY;  Surgeon: Leandrew Koyanagi, MD;  Location: [facility];  Service: Orthopedics;  Laterality: Left;   TOTAL KNEE ARTHROPLASTY Right 08/04/2019   Procedure: RIGHT TOTAL KNEE ARTHROPLASTY;  Surgeon: Leandrew Koyanagi, MD;  Location: [facility];  Service: Orthopedics;  Laterality: Right;   Social History   Occupational History    Employer: [employer]  Tobacco Use   Smoking status: Former    Packs/day: 0.25    Years: 27.00    Pack years: 6.75    Types: Cigarettes    Quit date: 08/20/2018    Years since quitting: 2.5   Smokeless tobacco: Never  Vaping Use   Vaping Use: Never used  Substance and Sexual Activity   Alcohol use: Yes    Comment: socially   Drug use: No   Sexual activity: Not on file

## 2021-02-25 ENCOUNTER — Ambulatory Visit: Payer: BC Managed Care – PPO

## 2021-02-25 ENCOUNTER — Other Ambulatory Visit: Payer: Self-pay

## 2021-04-18 ENCOUNTER — Telehealth: Payer: Self-pay | Admitting: Orthopaedic Surgery

## 2021-04-18 ENCOUNTER — Other Ambulatory Visit: Payer: Self-pay | Admitting: Physician Assistant

## 2021-04-18 MED ORDER — TRAMADOL HCL 50 MG PO TABS: 50.0000 mg | ORAL_TABLET | Freq: Two times a day (BID) | ORAL | 2 refills | Status: DC | PRN

## 2021-04-18 NOTE — Telephone Encounter (Signed)
Sent in tramadol.  Cannot do permanent, but happy to give him a 6 mo temp

## 2021-04-18 NOTE — Telephone Encounter (Signed)
LMOM with information and that handicap application will be up front for pick up tomorrow around lunch. ? ?

## 2021-04-18 NOTE — Telephone Encounter (Signed)
Patient is requesting a permanent handicap sticker for his car tags. He has 2 vehicles so he is not sure how to go about getting stickers for both. He is also needing medication refill for pain. ? ?Please advise. ?

## 2021-05-10 IMAGING — MR MRI OF THE RIGHT KNEE WITHOUT CONTRAST
4 of 6 series · 22 of 40 positions shown · non-contrast
Comparison: Radiographs dated 12/25/2017

CLINICAL DATA: Chronic right knee pain.

EXAM:
MRI OF THE RIGHT KNEE WITHOUT CONTRAST
TECHNIQUE: Multiplanar, multisequence MR imaging of the knee was performed. No
intravenous contrast was administered.

[Series 2: T2 fat-sat · axial · 4.0mm · 0.50mm/px · z∈[-56,+38]mm · 5 of 24 slices shown (1 of 2)]
[im 1/24]
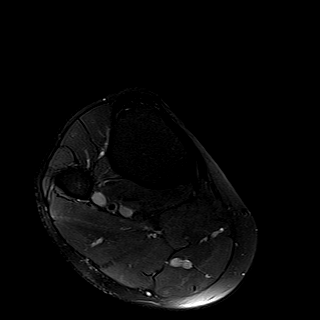
[im 4/24]
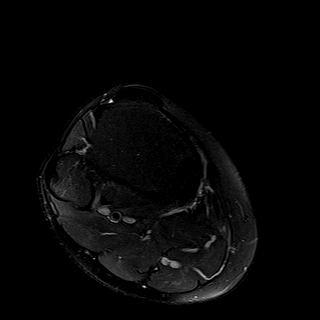
[im 8/24]
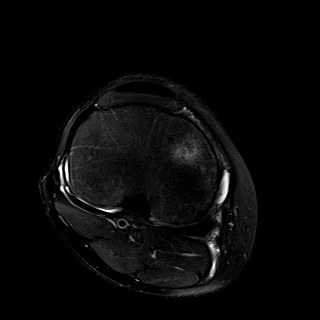
[im 12/24]
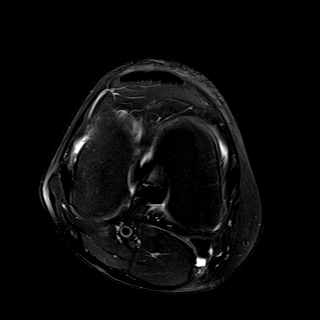
[im 20/24]
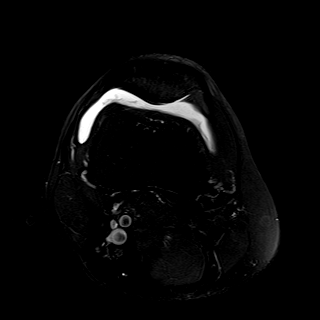

[Series 4: T2 fat-sat · coronal · 4.0mm · 0.29mm/px · 3 of 24 slices shown (2 of 2)]
[im 5/24]
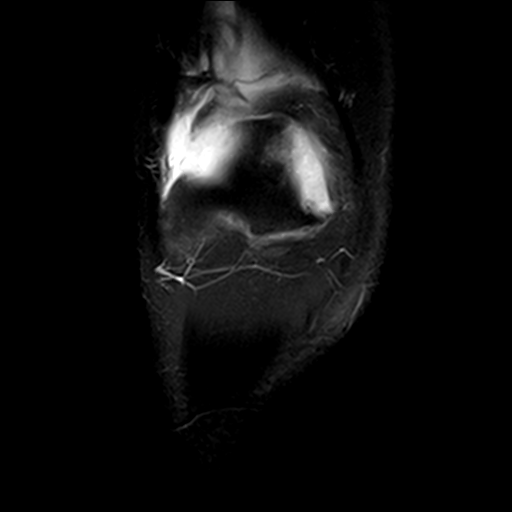
[im 14/24]
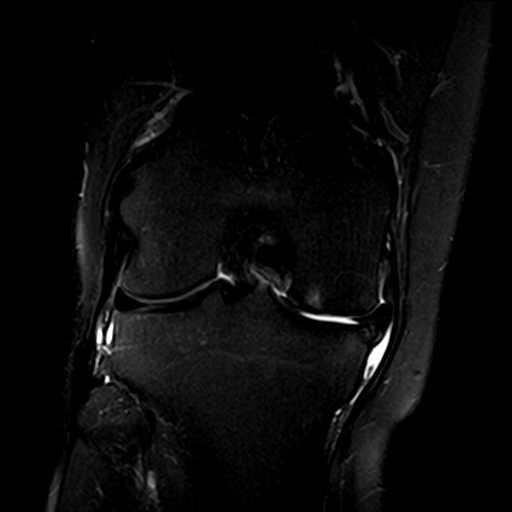
[im 24/24]
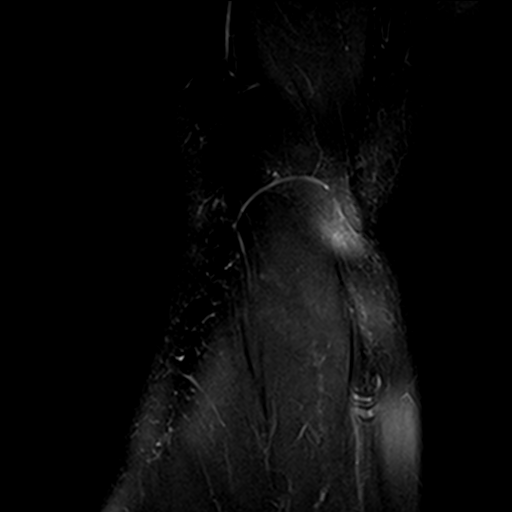

[Series 6: PD fat-sat · sagittal · 3.0mm · 0.29mm/px · 7 of 27 slices shown (1 of 2)]
[im 1/27]
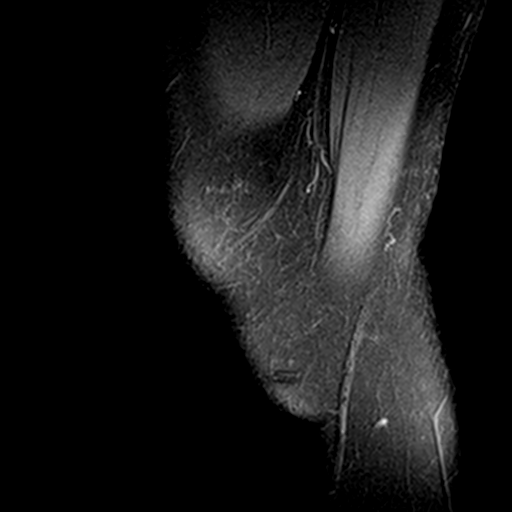
[im 5/27]
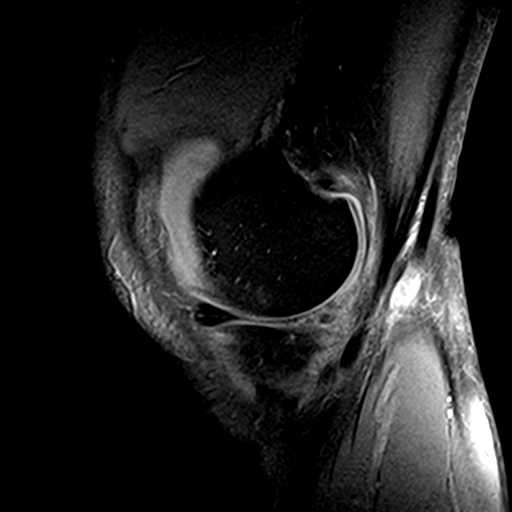
[im 9/27]
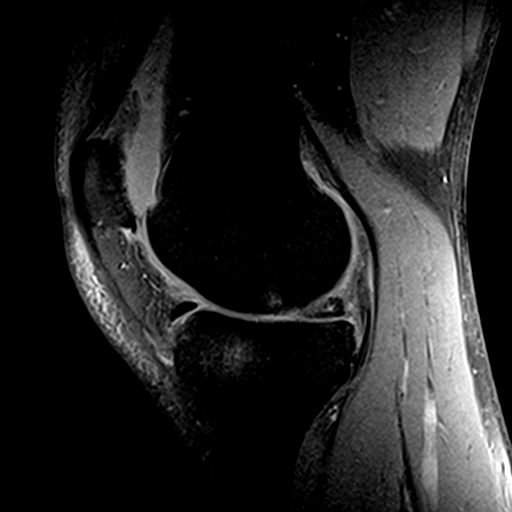
[im 14/27]
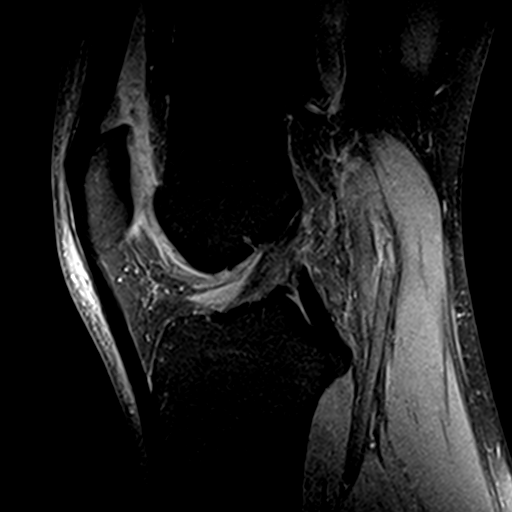
[im 18/27]
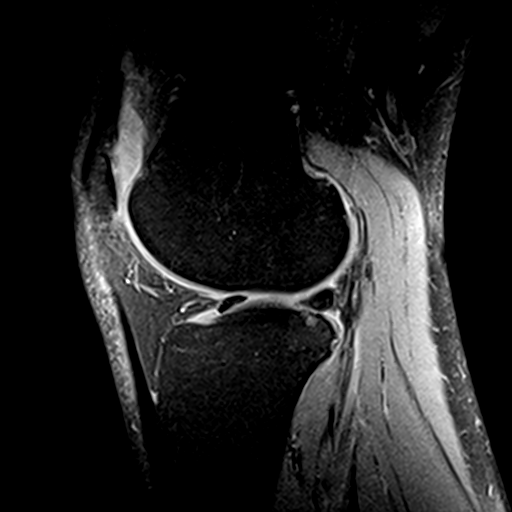
[im 22/27]
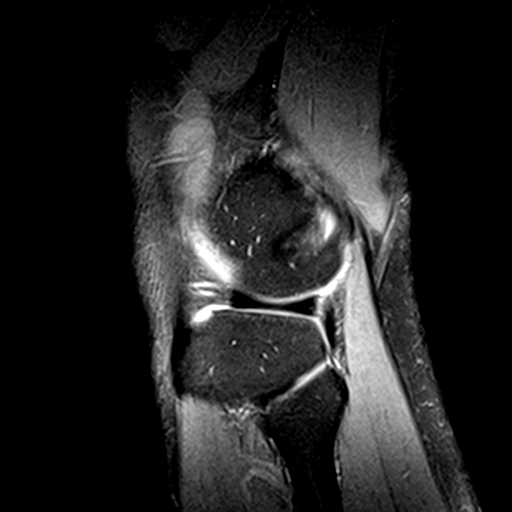
[im 27/27]
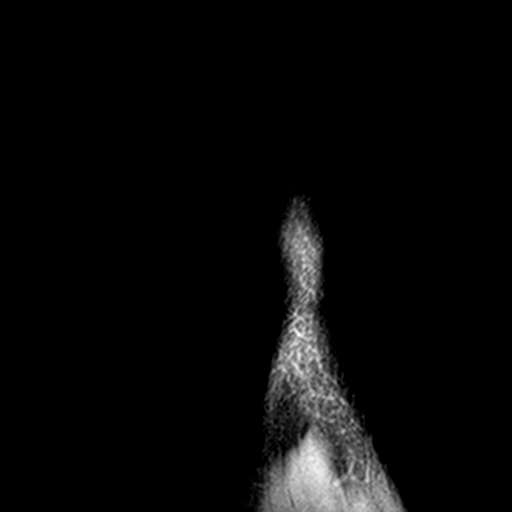

[Series 7: PD fat-sat · coronal · 3.0mm · 0.29mm/px · 7 of 28 slices shown (2 of 2)]
[im 1/28]
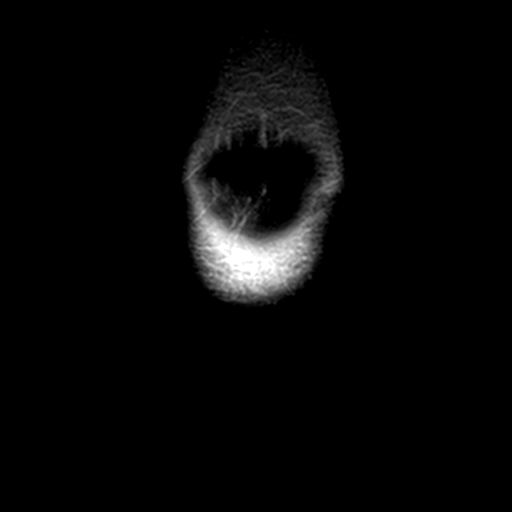
[im 5/28]
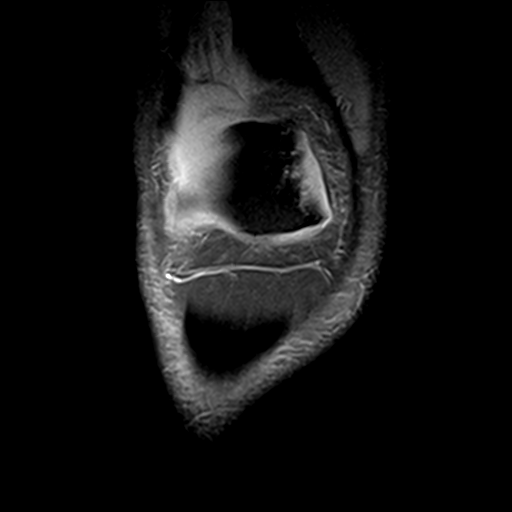
[im 10/28]
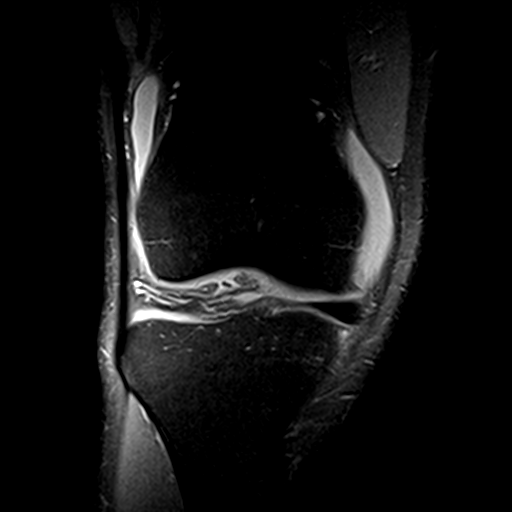
[im 14/28]
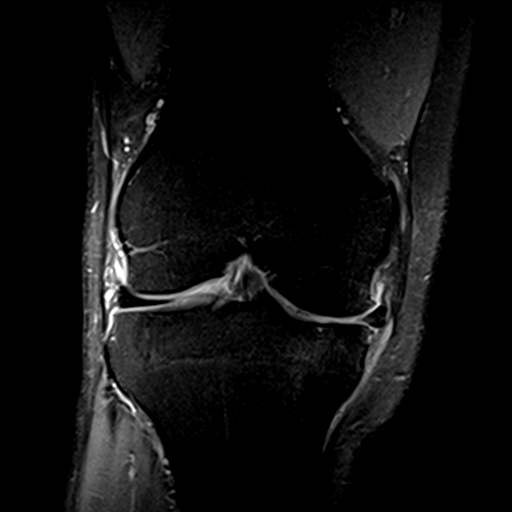
[im 19/28]
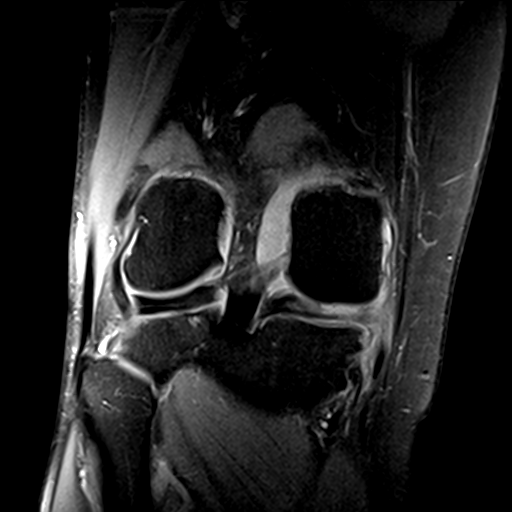
[im 23/28]
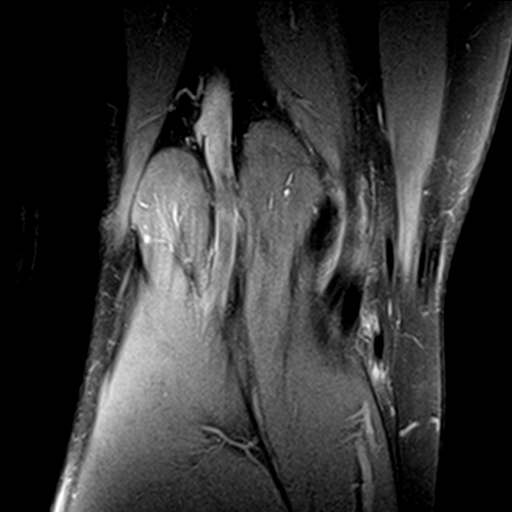
[im 28/28]
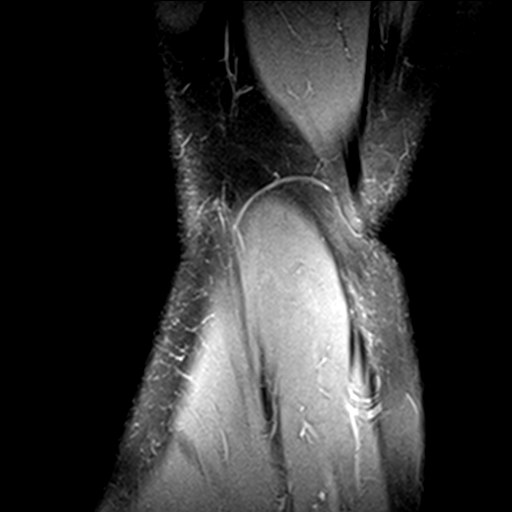

[22 of 40 positions shown; findings below may reference images not displayed]

FINDINGS: MENISCI

Medial meniscus: There is a complex irregular tear of the posterior
horn with degeneration and peripheral subluxation of the midbody.

Lateral meniscus:  Intact.

LIGAMENTS

Cruciates:  Normal.

Collaterals: Intact. Fluid deep to the distal MCL consistent with
medial bursitis, probably due to the underlying medial compartment
disease.

CARTILAGE

Patellofemoral: Focal marked thinning of the articular cartilage of
the medial facet of the patella.

Medial: Extensive full-thickness cartilage loss in the medial
compartment with focal areas of subcortical edema.

Lateral:  Normal.

Joint:  Small joint effusion.  Normal Hoffa's fat pad.

Popliteal Fossa:  Tiny Baker's cyst.  Intact popliteus tendon.

Extensor Mechanism:  Normal.

Bones:  Tiny marginal osteophytes in the medial compartment.

Other: None
IMPRESSION: 1. Complex tear of the posterior horn of the medial meniscus.
2. Extensive full-thickness cartilage loss in the medial
compartment. Focal medial bursitis.
3. Partial-thickness cartilage loss on the medial aspect of the
patella.

## 2021-06-12 IMAGING — US ULTRASOUND ABDOMEN COMPLETE
1 series · 14 of 25 positions shown · non-contrast
Comparison: None.

CLINICAL DATA: Epigastric pain.  Elevated liver function tests.

EXAM:
ABDOMEN ULTRASOUND COMPLETE

[Series 1: ultrasound abdomen complete · 0.15mm/px · 14 of 88 slices shown]
[im 1/88]
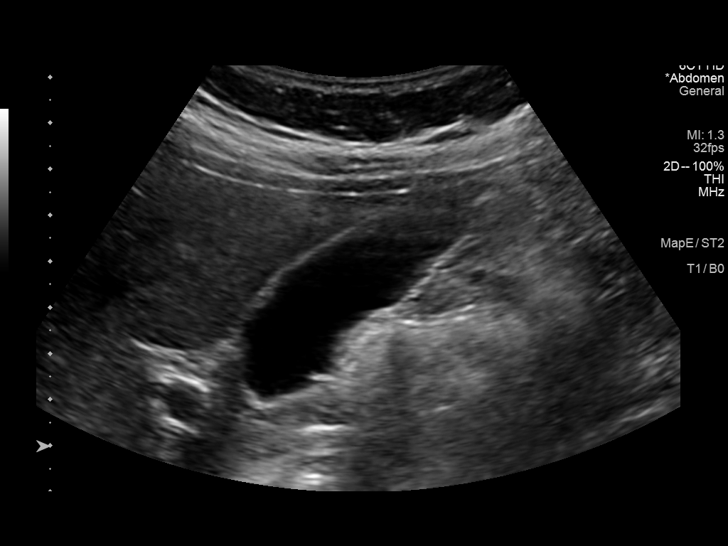
[im 8/88]
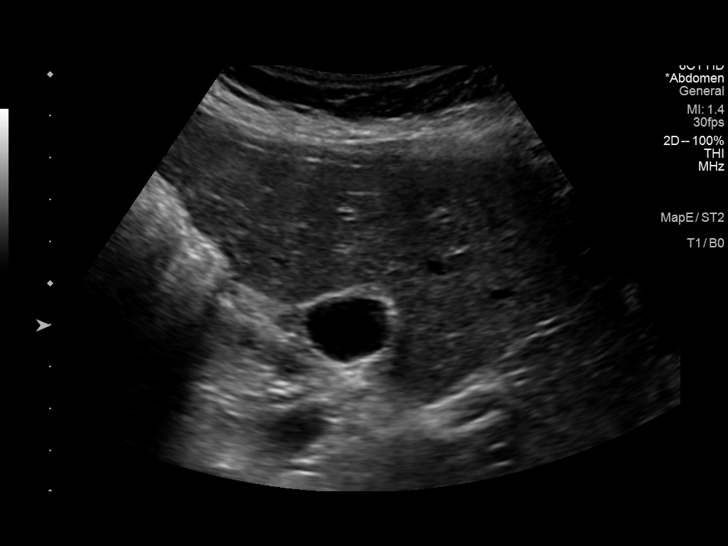
[im 15/88]
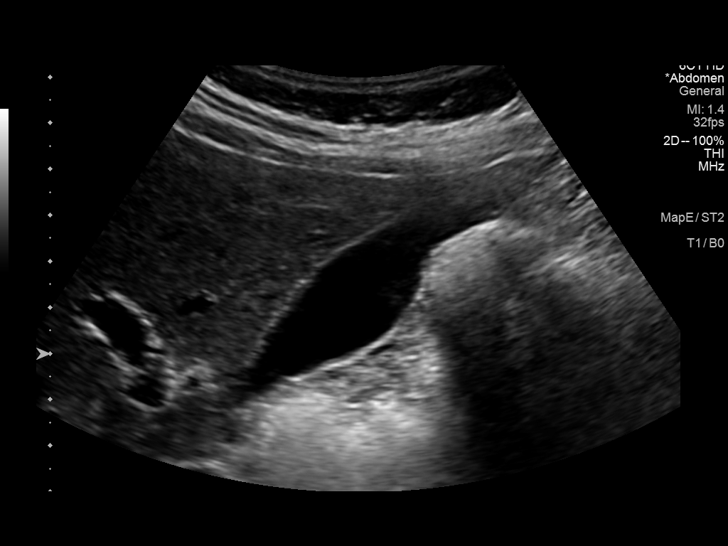
[im 22/88]
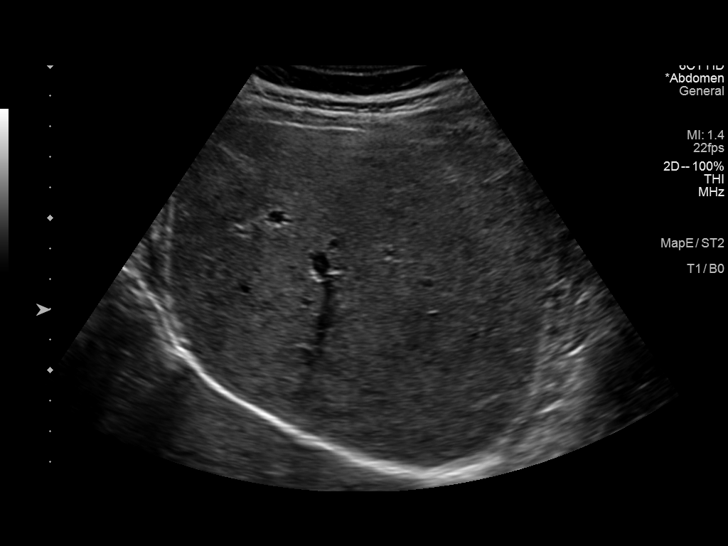
[im 30/88]
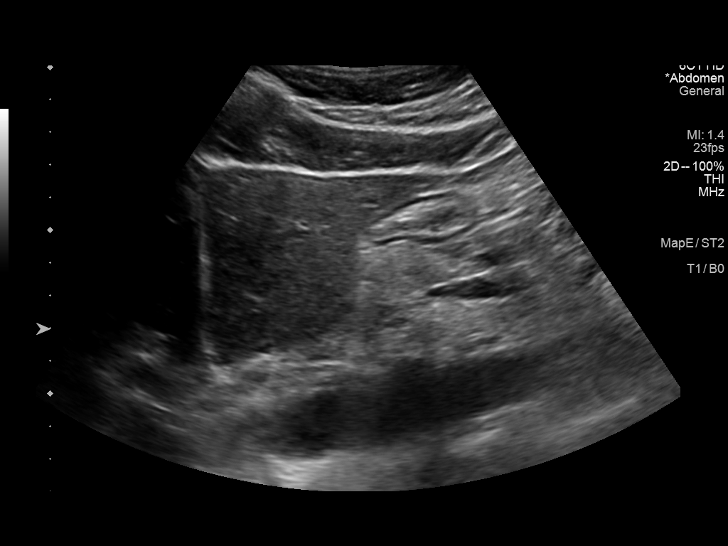
[im 33/88]
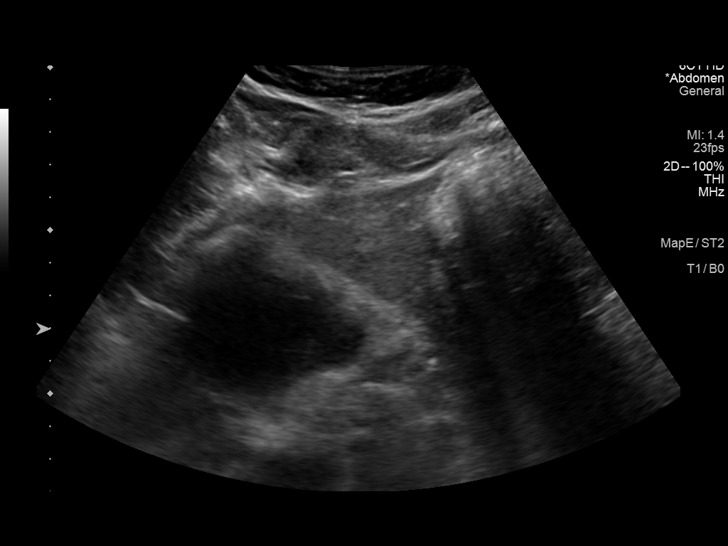
[im 40/88]
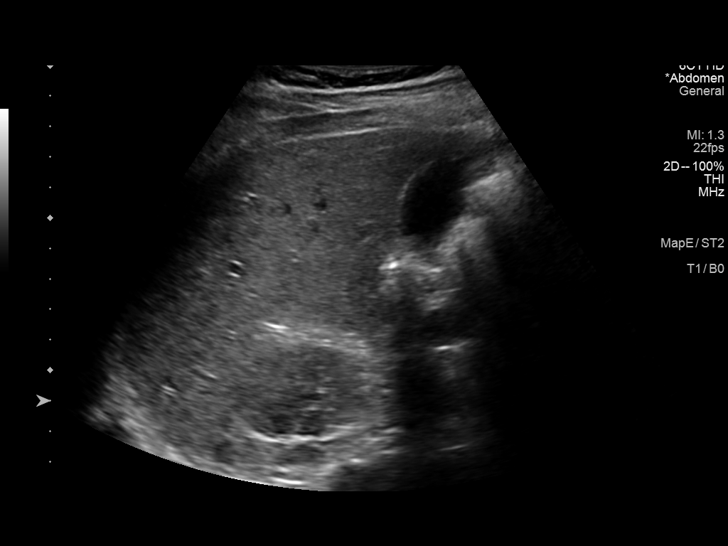
[im 48/88]
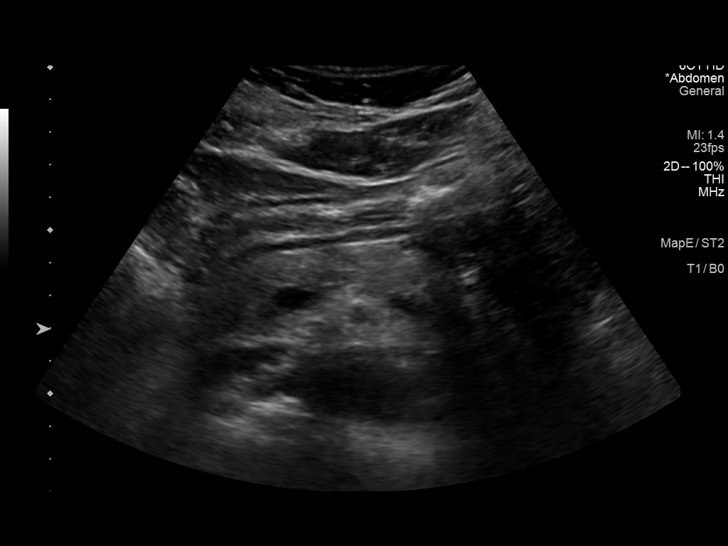
[im 55/88]
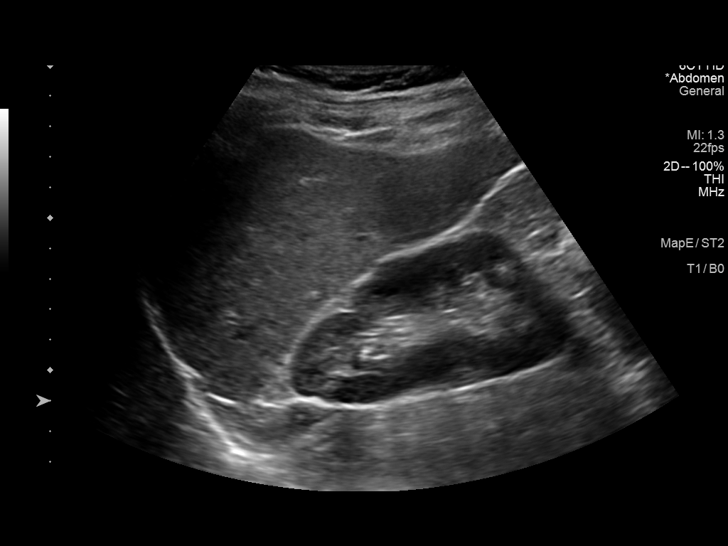
[im 59/88]
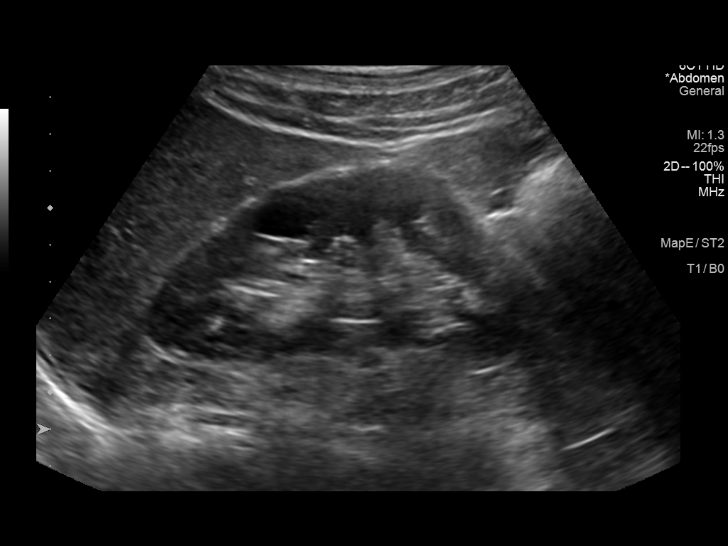
[im 66/88]
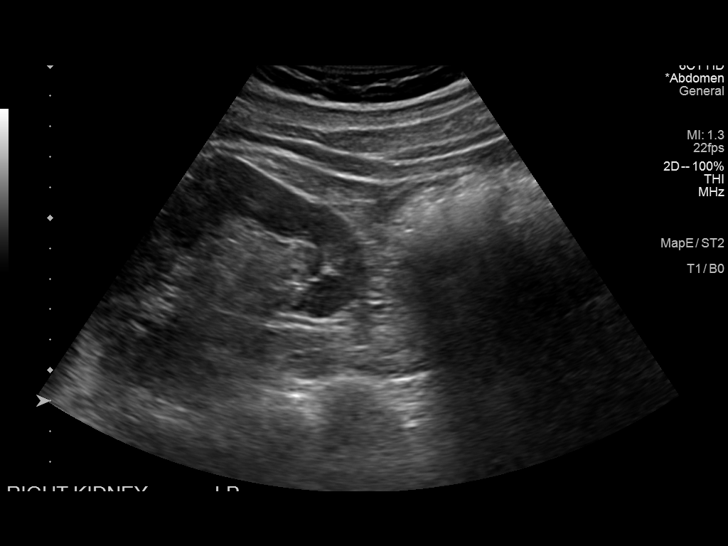
[im 73/88]
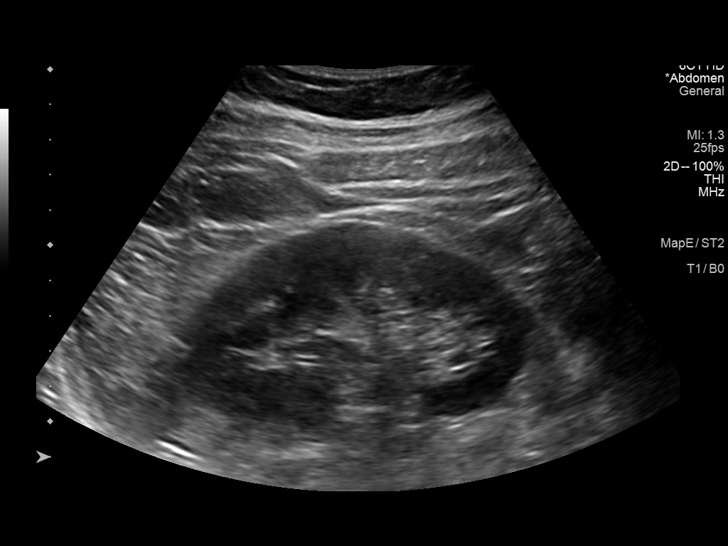
[im 80/88]
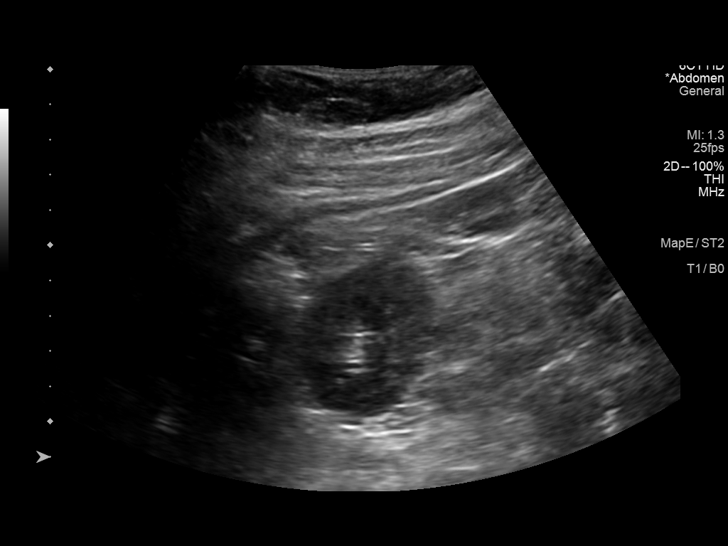
[im 88/88]
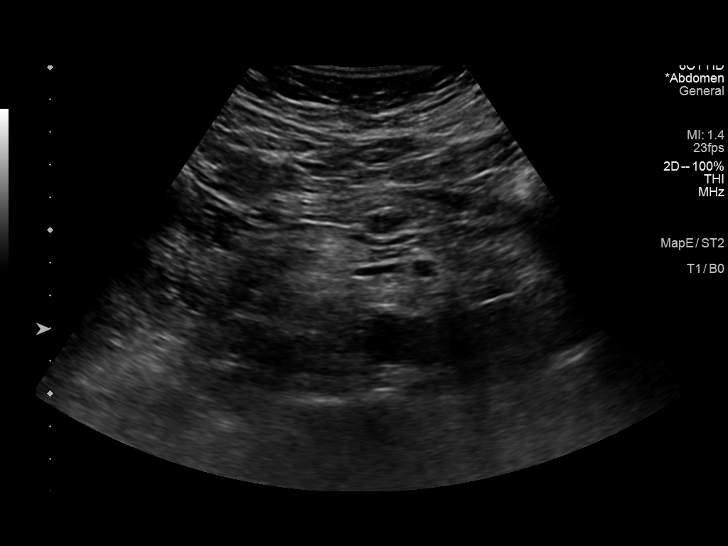

[14 of 25 positions shown; findings below may reference images not displayed]

FINDINGS: Gallbladder: No gallstones or wall thickening visualized. No
sonographic Murphy sign noted by sonographer.

Common bile duct: Diameter: 4 mm, within normal limits.

Liver: No focal lesion identified. Within normal limits in
parenchymal echogenicity. Portal vein is patent on color Doppler
imaging with normal direction of blood flow towards the liver.

IVC: No abnormality visualized.

Pancreas: Visualized portion unremarkable.

Spleen: Size and appearance within normal limits.

Right Kidney: Length: 10.9 cm. Echogenicity within normal limits. A
1.9 cm simple cyst is noted in the upper pole. No mass or
hydronephrosis visualized.

Left Kidney: Length: 11.1 cm. Echogenicity within normal limits. No
mass or hydronephrosis visualized.

Abdominal aorta: No aneurysm visualized.

Other findings: None.
IMPRESSION: No hepatobiliary abnormality or other significant findings.

## 2021-07-11 ENCOUNTER — Encounter: Payer: Self-pay | Admitting: Internal Medicine

## 2021-07-11 NOTE — Patient Instructions (Signed)

## 2021-07-11 NOTE — Progress Notes (Unsigned)
Annual  Screening/Preventative Visit  & Comprehensive Evaluation & Examination  Future Appointments  Date Time Provider Department  07/12/2021  3:00 PM Unk Pinto, MD [facility]  07/18/2022  3:00 PM Unk Pinto, MD [facility]             This very nice 61 y.o. MBM presents for a Screening /Preventative Visit & comprehensive evaluation and management of multiple medical co-morbidities.  Patient has been followed for HTN, HLD,  Prediabetes and Vitamin D Deficiency.       Patient has been followed expectantly for labile HTN.  Patient's BP has been controlled at home.  Today's BP is at goal - 124/80. Patient denies any cardiac symptoms as chest pain, palpitations, shortness of breath, dizziness or ankle swelling.  BP Readings from Last 3 Encounters:  02/09/21 118/74  01/25/21 140/90  01/16/21 136/87         Patient's hyperlipidemia is  controlled with diet.  Last lipids were at goal:  Lab Results  Component Value Date   CHOL 171 01/25/2021   HDL 57 01/25/2021   LDLCALC 98 01/25/2021   TRIG 75 01/25/2021   CHOLHDL 3.0 01/25/2021         Patient has hx/o prediabetes ( A1c 5.7%  /2015 and A1c 6.2%  /Jan 2021) and patient denies reactive hypoglycemic symptoms, visual blurring, diabetic polys or paresthesias. Last A1c was near goal:   Lab Results  Component Value Date   HGBA1C 6.1 (H) 01/25/2021         Finally, patient has history of Vitamin D Deficiency ("32"/ 2015)  and last vitamin D was still very low:   Lab Results  Component Value Date   VD25OH 34 01/25/2021     \  Current Outpatient Medications:    blood glucose meter kit and supplies, Dispense based insurance preference. E11.22 CHECK BLOOD SUGAR ONCE DAILY, Disp: 1 each, Rfl: 0   Cholecalciferol (VITAMIN D-3) 125 MCG (5000 UT) TABS, Take 10,000 Units by mouth daily., Disp: , Rfl:    Continuous Blood Gluc Receiver (FREESTYLE LIBRE 14 DAY READER) DEVI, 1 Device by Does not apply route every 14  (fourteen) days., Disp: 2 each, Rfl: 4   gabapentin (NEURONTIN) 800 MG tablet, Take  1 tablet  3 to 4 x /day  for Severe Pain, Disp: 120 tablet, Rfl: 0   glucose blood test strip, Test blood sugar once daily, Disp: 100 each, Rfl: 3   ibuprofen (ADVIL) 800 MG tablet, Take  1 tablet   3 x /day (every 8 hours)   as needed for Pain & Inflammation, Disp: 90 tablet, Rfl: 0   methocarbamol (ROBAXIN) 750 MG tablet, Take  1/2 to 1 tablet  2 to 3 x /day  if needed for Muscle Spasm, Disp: 30 tablet, Rfl: 1   promethazine (PHENERGAN) 12.5 MG tablet, Take 1 tablet (12.5 mg total) by mouth every 6 (six) hours as needed for nausea or vomiting., Disp: 30 tablet, Rfl: 0   sildenafil (VIAGRA) 100 MG tablet, Take 1 tablet (100 mg total) by mouth as needed for erectile dysfunction., Disp: 20 tablet, Rfl: 1   traMADol (ULTRAM) 50 MG tablet, Take 1 tablet (50 mg total) by mouth 2 (two) times daily as needed., Disp: 30 tablet, Rfl: 2    Allergies  Allergen Reactions   Benzodiazepines Hives   Chantix  [Varenicline] Hives   Cialis [Tadalafil]     Hives   Dicyclomine Hives   Meloxicam     Rash  Penicillins Hives    TOLERATED ANCEF 08/04/2019.  DID THE REACTION INVOLVE: Swelling of the face/tongue/throat, SOB, or low BP? Unknown Sudden or severe rash/hives, skin peeling, or the inside of the mouth or nose? Unknown Did it require medical treatment? n  When did it last happen? Over 5 years ago.  If all above answers are "NO", may proceed with cephalosporin use.    Relafen [Nabumetone]     Hives     Past Medical History:  Diagnosis Date   Allergy    albuterol as needed   Anal fissure    Arthritis    Colon polyps 01/13/11   Colonoscopy   GERD (gastroesophageal reflux disease)    Pneumonia    Pre-diabetes      Health Maintenance  Topic Date Due   COVID-19 Vaccine (5) 07/09/2020   Pneumococcal Vaccine 23 10/12/2020 (Originally 10/14/2010)   Zoster Vaccines- Shingrix (1 of 2) 10/12/2020 (Originally  12/07/1979)   INFLUENZA VACCINE  08/02/2020   TETANUS/TDAP  01/02/2021   Hepatitis C Screening  Completed   HIV Screening  Completed   HPV VACCINES  Aged Out     Immunization History  Administered Date(s) Administered   Influenza Inj Mdck Quad  10/18/2017   Influenza Split 10/13/2019   Influenza Whole 10/31/2006, 11/03/2010, 11/02/2011   Influenza,inj,Quad  09/12/2018   Influenza 01/03/2012   PFIZER SARS-COV-2 Vacc 03/25/2019, 04/15/2019, 11/25/2019, 03/09/2020   PPD Test 03/17/2013   Pneumococcal-23 10/13/2009   Td 01/03/2008   Tdap 01/03/2011    Last Colon - 01/13/2011 -   Dr Wilford Corner - 10 years   Past Surgical History:  Procedure Laterality Date   ANAL FISSURE REPAIR  02/24/11   CHONDROPLASTY  08/14/2018   Procedure: CHONDROPLASTY;  Surgeon: Leandrew Koyanagi, MD;  Location: [place];  Service: Orthopedics;;   COLONOSCOPY     KNEE ARTHROSCOPY WITH MEDIAL MENISECTOMY Left 08/14/2018   Procedure: LEFT KNEE ARTHROSCOPY WITH PARTIAL MEDIAL MENISCECTOMY;  Surgeon: Leandrew Koyanagi, MD;  Location: [place];  Service: Orthopedics;  Laterality: Left;   KNEE ARTHROSCOPY WITH MEDIAL MENISECTOMY Right 10/18/2018   Procedure: RIGHT KNEE ARTHROSCOPY WITH PARTIAL MEDIAL MENISCECTOMY;  Surgeon: Leandrew Koyanagi, MD;  Location: [place];  Service: Orthopedics;  Laterality: Right;   TOTAL KNEE ARTHROPLASTY Left 01/24/2019   Procedure: LEFT TOTAL KNEE ARTHROPLASTY;  Surgeon: Leandrew Koyanagi, MD;  Location: [place];  Service: Orthopedics;  Laterality: Left;   TOTAL KNEE ARTHROPLASTY Right 08/04/2019   Procedure: RIGHT TOTAL KNEE ARTHROPLASTY;  Surgeon: Leandrew Koyanagi, MD;  Location: [place];  Service: Orthopedics;  Laterality: Right;     Family History  Problem Relation Age of Onset   Rheum arthritis Mother    Heart disease Mother    Diabetes Mother    Arthritis Mother    Diabetes Father    Blindness Father        from DM?   Allergies Daughter     Allergies Sister    Asthma Daughter     Social History   Socioeconomic History   Marital status: Married    Spouse name: Not on file   Number of children: 2  Occupational History    Employer: [employer]  Tobacco Use   Smoking status: Former    Packs/day: 0.25    Years: 27.00    Pack years: 6.75    Types: Cigarettes    Quit date: 08/20/2018    Years since quitting: 1.8  Smokeless tobacco: Never  Vaping Use   Vaping Use: Never used  Substance and Sexual Activity   Alcohol use: Yes    Comment: socially   Drug use: No   Sexual activity: Not on file  Other Topics Concern    ROS Constitutional: Denies fever, chills, weight loss/gain, headaches, insomnia,  night sweats or change in appetite. Does c/o fatigue. Eyes: Denies redness, blurred vision, diplopia, discharge, itchy or watery eyes.  ENT: Denies discharge, congestion, post nasal drip, epistaxis, sore throat, earache, hearing loss, dental pain, Tinnitus, Vertigo, Sinus pain or snoring.  Cardio: Denies chest pain, palpitations, irregular heartbeat, syncope, dyspnea, diaphoresis, orthopnea, PND, claudication or edema Respiratory: denies cough, dyspnea, DOE, pleurisy, hoarseness, laryngitis or wheezing.  Gastrointestinal: Denies dysphagia, heartburn, reflux, water brash, pain, cramps, nausea, vomiting, bloating, diarrhea, constipation, hematemesis, melena, hematochezia, jaundice or hemorrhoids Genitourinary: Denies dysuria, frequency, urgency, nocturia, hesitancy, discharge, hematuria or flank pain Musculoskeletal: Denies arthralgia, myalgia, stiffness, Jt. Swelling, pain, limp or strain/sprain. Denies Falls. Skin: Denies puritis, rash, hives, warts, acne, eczema or change in skin lesion Neuro: No weakness, tremor, incoordination, spasms, paresthesia or pain Psychiatric: Denies confusion, memory loss or sensory loss. Denies Depression. Endocrine: Denies change in weight, skin, hair change, nocturia, and paresthesia, diabetic polys,  visual blurring or hyper / hypo glycemic episodes.  Heme/Lymph: No excessive bleeding, bruising or enlarged lymph nodes.   Physical Exam  There were no vitals taken for this visit.  General Appearance: Well nourished and well groomed and in no apparent distress.  Eyes: PERRLA, EOMs, conjunctiva no swelling or erythema, normal fundi and vessels. Sinuses: No frontal/maxillary tenderness ENT/Mouth: EACs patent / TMs  nl. Nares clear without erythema, swelling, mucoid exudates. Oral hygiene is good. No erythema, swelling, or exudate. Tongue normal, non-obstructing. Tonsils not swollen or erythematous. Hearing normal.  Neck: Supple, thyroid not palpable. No bruits, nodes or JVD. Respiratory: Respiratory effort normal.  BS equal and clear bilateral without rales, rhonci, wheezing or stridor. Cardio: Heart sounds are normal with regular rate and rhythm and no murmurs, rubs or gallops. Peripheral pulses are normal and equal bilaterally without edema. No aortic or femoral bruits. Chest: symmetric with normal excursions and percussion.  Abdomen: Soft, with Nl bowel sounds. Nontender, no guarding, rebound, hernias, masses, or organomegaly.  Lymphatics: Non tender without lymphadenopathy.  Musculoskeletal: Full ROM all peripheral extremities, joint stability, 5/5 strength, and normal gait. Skin: Warm and dry without rashes, lesions, cyanosis, clubbing or  ecchymosis.  Neuro: Cranial nerves intact, reflexes equal bilaterally. Normal muscle tone, no cerebellar symptoms. Sensation intact.  Pysch: Alert and oriented X 3 with normal affect, insight and judgment appropriate.   Assessment and Plan  1. Annual Preventative/Screening Exam    2. Labile hypertension  - EKG 12-Lead - [place], RETROPERITNL ABD,  LTD - Urinalysis, Routine w reflex microscopic - Microalbumin / creatinine urine ratio - CBC with Differential/Platelet - COMPLETE METABOLIC PANEL WITH GFR - Magnesium - TSH  3. Hyperlipidemia,  mixed  - EKG 12-Lead - [place], RETROPERITNL ABD,  LTD - Lipid panel - TSH  4. Abnormal glucose  - EKG 12-Lead - [place], RETROPERITNL ABD,  LTD - Hemoglobin A1c - Insulin, random  5. Vitamin D deficiency  - VITAMIN D 25 Hydroxy   6. Gastroesophageal reflux disease  - CBC with Differential/Platelet  7. Screening for colorectal cancer  - POC Hemoccult Bld/Stl   8. Prostate cancer screening  - PSA  9. Screening-pulmonary TB  - TB Skin Test  10. Screening for heart disease  -  EKG 12-Lead  11. FHx: heart disease  - EKG 12-Lead - [place], RETROPERITNL ABD,  LTD  12. Former smoker  - EKG 12-Lead - [place], RETROPERITNL ABD,  LTD  13. Screening for AAA (aortic abdominal aneurysm)  - [place], RETROPERITNL ABD,  LTD  14. Fatigue, unspecified type  - Iron, Total/Total Iron Binding Cap - Vitamin B12 - Testosterone - CBC with Differential/Platelet - TSH  15. Medication management  - Urinalysis, Routine w reflex microscopic - Microalbumin / creatinine urine ratio - CBC with Differential/Platelet - COMPLETE METABOLIC PANEL WITH GFR - Magnesium - Lipid panel - TSH - Hemoglobin A1c - Insulin, random - VITAMIN D 25 Hydroxy            Patient was counseled in prudent diet, weight control to achieve/maintain BMI less than 25, BP monitoring, regular exercise and medications as discussed.  Discussed med effects and SE's. Routine screening labs and tests as requested with regular follow-up as recommended. Over 40 minutes of exam, counseling, chart review and high complex critical decision making was performed   Kirtland Bouchard, MD

## 2021-07-12 ENCOUNTER — Encounter: Payer: Self-pay | Admitting: Internal Medicine

## 2021-07-12 ENCOUNTER — Ambulatory Visit (INDEPENDENT_AMBULATORY_CARE_PROVIDER_SITE_OTHER): Payer: BC Managed Care – PPO | Admitting: Internal Medicine

## 2021-07-12 VITALS — BP 140/80 | HR 74 | Temp 96.9°F | Resp 16 | Ht 66.0 in | Wt 166.6 lb

## 2021-07-12 DIAGNOSIS — Z13220 Encounter for screening for lipoid disorders: Secondary | ICD-10-CM

## 2021-07-12 DIAGNOSIS — Z87891 Personal history of nicotine dependence: Secondary | ICD-10-CM

## 2021-07-12 DIAGNOSIS — Z125 Encounter for screening for malignant neoplasm of prostate: Secondary | ICD-10-CM | POA: Diagnosis not present

## 2021-07-12 DIAGNOSIS — Z131 Encounter for screening for diabetes mellitus: Secondary | ICD-10-CM | POA: Diagnosis not present

## 2021-07-12 DIAGNOSIS — Z8249 Family history of ischemic heart disease and other diseases of the circulatory system: Secondary | ICD-10-CM | POA: Diagnosis not present

## 2021-07-12 DIAGNOSIS — Z1389 Encounter for screening for other disorder: Secondary | ICD-10-CM

## 2021-07-12 DIAGNOSIS — Z130 Encounter for screening for diseases of the blood and blood-forming organs and certain disorders involving the immune mechanism: Secondary | ICD-10-CM

## 2021-07-12 DIAGNOSIS — K219 Gastro-esophageal reflux disease without esophagitis: Secondary | ICD-10-CM

## 2021-07-12 DIAGNOSIS — Z0001 Encounter for general adult medical examination with abnormal findings: Secondary | ICD-10-CM

## 2021-07-12 DIAGNOSIS — Z111 Encounter for screening for respiratory tuberculosis: Secondary | ICD-10-CM

## 2021-07-12 DIAGNOSIS — R0989 Other specified symptoms and signs involving the circulatory and respiratory systems: Secondary | ICD-10-CM | POA: Diagnosis not present

## 2021-07-12 DIAGNOSIS — Z136 Encounter for screening for cardiovascular disorders: Secondary | ICD-10-CM | POA: Diagnosis not present

## 2021-07-12 DIAGNOSIS — E782 Mixed hyperlipidemia: Secondary | ICD-10-CM

## 2021-07-12 DIAGNOSIS — Z0000 Encounter for general adult medical examination without abnormal findings: Secondary | ICD-10-CM

## 2021-07-12 DIAGNOSIS — Z1211 Encounter for screening for malignant neoplasm of colon: Secondary | ICD-10-CM

## 2021-07-12 DIAGNOSIS — I700 Atherosclerosis of aorta: Secondary | ICD-10-CM | POA: Diagnosis not present

## 2021-07-12 DIAGNOSIS — Z1329 Encounter for screening for other suspected endocrine disorder: Secondary | ICD-10-CM | POA: Diagnosis not present

## 2021-07-12 DIAGNOSIS — E559 Vitamin D deficiency, unspecified: Secondary | ICD-10-CM

## 2021-07-12 DIAGNOSIS — N401 Enlarged prostate with lower urinary tract symptoms: Secondary | ICD-10-CM | POA: Diagnosis not present

## 2021-07-12 DIAGNOSIS — Z79899 Other long term (current) drug therapy: Secondary | ICD-10-CM | POA: Diagnosis not present

## 2021-07-12 DIAGNOSIS — R350 Frequency of micturition: Secondary | ICD-10-CM | POA: Diagnosis not present

## 2021-07-12 DIAGNOSIS — R7309 Other abnormal glucose: Secondary | ICD-10-CM

## 2021-07-12 DIAGNOSIS — R5383 Other fatigue: Secondary | ICD-10-CM

## 2021-07-13 LAB — URINALYSIS, ROUTINE W REFLEX MICROSCOPIC
Bilirubin Urine: NEGATIVE
Glucose, UA: NEGATIVE
Hgb urine dipstick: NEGATIVE
Ketones, ur: NEGATIVE
Leukocytes,Ua: NEGATIVE
Nitrite: NEGATIVE
Protein, ur: NEGATIVE
Specific Gravity, Urine: 1.022 (ref 1.001–1.035)
pH: 6.5 (ref 5.0–8.0)

## 2021-07-13 LAB — CBC WITH DIFFERENTIAL/PLATELET
Absolute Monocytes: 398 cells/uL (ref 200–950)
Basophils Absolute: 50 cells/uL (ref 0–200)
Basophils Relative: 0.9 %
Eosinophils Absolute: 202 cells/uL (ref 15–500)
Eosinophils Relative: 3.6 %
HCT: 42 % (ref 38.5–50.0)
Hemoglobin: 13.3 g/dL (ref 13.2–17.1)
Lymphs Abs: 2481 cells/uL (ref 850–3900)
MCH: 27 pg (ref 27.0–33.0)
MCHC: 31.7 g/dL — ABNORMAL LOW (ref 32.0–36.0)
MCV: 85.2 fL (ref 80.0–100.0)
MPV: 10.7 fL (ref 7.5–12.5)
Monocytes Relative: 7.1 %
Neutro Abs: 2470 cells/uL (ref 1500–7800)
Neutrophils Relative %: 44.1 %
Platelets: 233 10*3/uL (ref 140–400)
RBC: 4.93 10*6/uL (ref 4.20–5.80)
RDW: 12.3 % (ref 11.0–15.0)
Total Lymphocyte: 44.3 %
WBC: 5.6 10*3/uL (ref 3.8–10.8)

## 2021-07-13 LAB — COMPLETE METABOLIC PANEL WITH GFR
AG Ratio: 1.7 (calc) (ref 1.0–2.5)
ALT: 20 U/L (ref 9–46)
AST: 19 U/L (ref 10–35)
Albumin: 4.2 g/dL (ref 3.6–5.1)
Alkaline phosphatase (APISO): 92 U/L (ref 35–144)
BUN: 17 mg/dL (ref 7–25)
CO2: 27 mmol/L (ref 20–32)
Calcium: 9.2 mg/dL (ref 8.6–10.3)
Chloride: 109 mmol/L (ref 98–110)
Creat: 1.07 mg/dL (ref 0.70–1.35)
Globulin: 2.5 g/dL (calc) (ref 1.9–3.7)
Glucose, Bld: 91 mg/dL (ref 65–99)
Potassium: 4.2 mmol/L (ref 3.5–5.3)
Sodium: 142 mmol/L (ref 135–146)
Total Bilirubin: 0.2 mg/dL (ref 0.2–1.2)
Total Protein: 6.7 g/dL (ref 6.1–8.1)
eGFR: 79 mL/min/{1.73_m2} (ref >=60)

## 2021-07-13 LAB — MAGNESIUM: Magnesium: 2 mg/dL (ref 1.5–2.5)

## 2021-07-13 LAB — HEMOGLOBIN A1C
Hgb A1c MFr Bld: 5.8 % of total Hgb — ABNORMAL HIGH (ref <5.7)
Mean Plasma Glucose: 120 mg/dL
eAG (mmol/L): 6.6 mmol/L

## 2021-07-13 LAB — MICROALBUMIN / CREATININE URINE RATIO
Creatinine, Urine: 123 mg/dL (ref 20–320)
Microalb, Ur: <0.2 mg/dL

## 2021-07-13 LAB — VITAMIN D 25 HYDROXY (VIT D DEFICIENCY, FRACTURES): Vit D, 25-Hydroxy: 37 ng/mL (ref 30–100)

## 2021-07-13 LAB — LIPID PANEL
Cholesterol: 182 mg/dL (ref <200)
HDL: 59 mg/dL (ref >=40)
LDL Cholesterol (Calc): 102 mg/dL (calc) — ABNORMAL HIGH
Non-HDL Cholesterol (Calc): 123 mg/dL (calc) (ref <130)
Total CHOL/HDL Ratio: 3.1 (calc) (ref <5.0)
Triglycerides: 109 mg/dL (ref <150)

## 2021-07-13 LAB — TSH: TSH: 1.05 mIU/L (ref 0.40–4.50)

## 2021-07-13 LAB — TESTOSTERONE: Testosterone: 186 ng/dL — ABNORMAL LOW (ref 250–827)

## 2021-07-13 LAB — PSA: PSA: 0.59 ng/mL (ref <=4.00)

## 2021-07-13 LAB — IRON, TOTAL/TOTAL IRON BINDING CAP
%SAT: 20 % (calc) (ref 20–48)
Iron: 63 ug/dL (ref 50–180)
TIBC: 322 mcg/dL (calc) (ref 250–425)

## 2021-07-13 LAB — INSULIN, RANDOM: Insulin: 28.3 u[IU]/mL — ABNORMAL HIGH

## 2021-07-13 LAB — VITAMIN B12: Vitamin B-12: 453 pg/mL (ref 200–1100)

## 2021-07-13 NOTE — Progress Notes (Signed)
<><><><><><><><><><><><><><><><><><><><><><><><><><><><><><><><><> <><><><><><><><><><><><><><><><><><><><><><><><><><><><><><><><><> - Test results slightly outside the reference range are not unusual. If there is anything important, I will review this with you,  otherwise it is considered normal test values.  If you have further questions,  please do not hesitate to contact me at the office or via My Chart.  <><><><><><><><><><><><><><><><><><><><><><><><><><><><><><><><><> <><><><><><><><><><><><><><><><><><><><><><><><><><><><><><><><><>  - Testosterone = 186 - Very Low          ( Ideal or Goal testosterone is between 450-830 )  - Recommend take Zinc 50 mg Daily to help raise Testosterone levels naturally   ]]]]]]]]]]]]]]]]]]]]]]]]]]]]]]]]]]]]]]]]]]]]]]]]]]]]]]]]]]]]]]]]]]]]]]]]]]]]]]]]]]]]]]]]]]]]]]]]]]]]]]]]]]]]]]]]]]]]]]]]]]]]]]]]]]]]]]]]]  -  9 Ways to Naturally Increase Testosterone Levels  1.   Lose Weight  If you're overweight, shedding the excess pounds may increase your testosterone levels,  according to research presented at the Endocrine Society's 2012 meeting. Overweight  men are more likely to have low testosterone levels to begin with, so this is an important  trick to increase your body's testosterone production when you need it most.  2.   High-Intensity Exercise like Peak Fitness   Short intense exercise has a proven positive effect on increasing testosterone levels and  preventing its decline. That's unlike aerobics or prolonged moderate exercise, which have  shown to have negative or no effect on testosterone levels. Having a whey protein meal after exercise can further enhance the satiety/testosterone- boosting impact (hunger hormones cause the opposite effect on your testosterone and  libido). Here's a summary of what a typical high-intensity Peak Fitness routine might look  like: " Warm up for three minutes  " Exercise as hard and fast as you can for 30  seconds. You should feel like you  couldn't possibly go on another few seconds  " Recover at a slow to moderate pace for 90 seconds  " Repeat the high intensity exercise and recovery 7 more times .  3.   Consume Plenty of Zinc ( 50 mg tablet )   The mineral zinc is important for testosterone production, and supplementing your diet  for as little as six weeks has been shown to cause a marked improvement in testosterone  among men with low levels.1 Likewise, research has shown that restricting dietary  sources of zinc leads to a significant decrease in testosterone, while zinc supplementation  increases it - and even protects men from exercised-induced reductions in testosterone  levels.  It's estimated that up to 45 percent of adults over the age of 60 may have lower than  recommended zinc intakes; even when dietary supplements were added in, an  estimated 20-25 percent of older adults still had inadequate zinc intakes, according to  a [employer] and Nutrition Examination Survey.  4. Your diet is the best source of zinc; along with protein-rich foods like  fish, other good  dietary sources of zinc include raw milk, raw cheese, beans, and yogurt or kefir made  from raw milk. It can be difficult to obtain enough dietary zinc if you're a vegetarian, and  also for meat-eaters as well, largely because of conventional farming methods that rely  heavily on chemical fertilizers and pesticides. These chemicals deplete the soil of  nutrients ... nutrients like zinc that must be absorbed by plants in order to be passed on  to you. In many cases, you may further deplete the nutrients in your food by the way you prepare  it. For most food, cooking it will drastically reduce its levels of nutrients like zinc ...  particularly over-cooking, which many people do. If  you decide to use a zinc supplement, stick to a dosage of 50 mg a day, as this is the  recommended adult dose. Taking too much zinc  can interfere with your body's ability to  absorb other minerals, especially copper, and may cause nausea as a side effect.  4.   Strength Training  In addition to Peak Fitness, strength training is also known to boost testosterone levels,  provided you are doing so intensely enough. When strength training to boost testosterone,  you'll want to increase the weight and lower your number of reps, and then focus on  exercises that work a large number of muscles, such as dead lifts or squats.  You can "turbo-charge" your weight training by going slower. By slowing down your  movement, you're actually turning it into a high-intensity exercise. Super Slow movement  allows your muscle, at the microscopic level, to access the maximum number of cross-bridges  between the protein filaments that produce movement in the muscle.   5.   Optimize Your Vitamin D Levels  Vitamin D, a steroid hormone, is essential for the healthy development of the nucleus of the  sperm cell, and helps maintain semen quality and sperm count. Vitamin D also increases  levels of testosterone, which may boost libido. In one study, overweight men who were given  vitamin D supplements had a significant increase in testosterone levels after one year.5   6.   Reduce Stress  When you're under a lot of stress, your body releases high levels of the stress hormone  cortisol. This hormone actually blocks the effects of testosterone,   presumably because,  from a biological standpoint, testosterone-associated behaviors (mating, competing,  aggression) may have lowered your chances of survival in an emergency (hence, the "fight  or flight" response is dominant, courtesy of cortisol).  7.   Limit or Eliminate Sugar from Your Diet  Testosterone levels decrease after you eat sugar, which is likely because the sugar leads to  a high insulin level, another factor leading to low testosterone.  Based on USDA estimates, the average  American consumes 12 teaspoons of sugar a day,  which equates to about TWO TONS of sugar during a lifetime.  8.   Eat Healthy Fats  By healthy, this means not only mono- and polyunsaturated fats, like that found in avocadoes  and nuts, but also saturated, as these are essential for building testosterone. Research shows  that a diet with less than 40 percent of energy as fat (and that mainly from animal sources, i.e.  saturated) lead to a decrease in testosterone levels. ie eat less animal products - as Meat , poultry and dairy. Experts agree that the ideal diet includes somewhere between 50-70% fat.  It's important to understand that your body requires saturated fats from animal and vegetables sources (such as meat, dairy, certain oils, and tropical plants like coconut) for optimal functioning  and if you neglect this important food group in favor of sugar, grains and other starchy carbs, your  health and weight are almost guaranteed to suffer. Examples of healthy fats you can eat more of  to give your testosterone levels a boost include: Olives and Olive oil  Coconuts and coconut oil Butter made from raw grass-fed organic milk Raw nuts, such as, almonds or pecans Organic pastured egg yolks Avocados Grass-fed meats Palm oil Unheated organic nut oils  9.   Boost Your Intake of Branch Chain Amino Acids (BCAA) from Foods Like Whey  Protein  Research suggests that BCAAs result in higher testosterone levels, particularly when  taken along  with resistance training. While BCAAs are available in supplement form, you'll find the  highest  concentrations of BCAAs like leucine in whey protein. Even when getting leucine from your natural food supply, it's often wasted or used  as a building block instead of an anabolic agent. So to create the correct anabolic  environment, you need to boost  leucine  consumption way beyond mere maintenance levels. That said, keep in mind that using leucine as a free form amino acid can be highly  counterproductive  as when free form amino acids are artificially administrated, they rapidly enter your  circulation while disrupting insulin function, and impairing your body's glycemic  control.  Food-based leucine is really the ideal form that can benefit your muscles without  side effects.  [[[[[[[[[[[[[[[[[[[[[[[[[[[[[[[[[[[[[[[[[[[[[[[[[[[[[[[[[[[[[[[[[[[[[[[[[[[[[[[[[[[[[[[[[[[[[[[[[[[[[[[[[[[[[[[[[[[[[[[[[[[[[[[[[[[[[[[[[  - Total Chol = 182 - Great     - Very low risk for Heart Attack  / Stroke <><><><><><><><><><><><><><><><><><><><><><><><><><><><><><><><><>  - A1c better - Down from 6.1% to now   ->  5.8%  ( goal is less than 5.7% )                                                                                - PSA - very low - Great  !  <><><><><><><><><><><><><><><><><><><><><><><><><><><><><><><><><>  -  -  Vitamin B12 =      Very Low  (Ideal or Goal Vit B12 is between 450 - 1,100)   Low Vit B12 may be associated with Anemia , Fatigue, Impotence                                                                                                                                                                                                                                                                                                                    e  Peripheral Neuropathy, Dementia, "Brain Fog", & Depression  - Recommend take a sub-lingual form of Vitamin B12 tablet   1,000 to 5,000 mcg tab that you dissolve under your tongue /Daily   - Can get Lavonia Dana - best price at  [employer] or on [employer] <><><><><><><><><><><><><><><><><><><><><><><><><><><><><><><><><>  - Vitamin D = 37  - is way low   - Vitamin D goal is between 70-100.   ........................................................................................................................................... '''''''''''''''''''''''''''''''''''''''''''''''''''''''''''''''''''''''''''''''''''''''''''''''''''''''''''''''''''''''''''''''''''''''''''''''''''''''''''''''''''''''''''''''''''''''''''''''''''''''''' - Please make sure that you are taking your Vitamin D 10,000 units /day as directed.  .......................................................................................................................................... ''''''''''''''''''''''''''''''''''''''''''''''''''''''''''''''''''''''''''''''''''''''''''''''''''''''''''''''''''''''''''''''''''''''''''''''''''''''''''''''''''''''''''''''''''''''''''''''''''''''''  - It is very important as a natural anti-inflammatory and helping the  immune system protect against viral infections, like the Covid-19    helping hair, skin, and nails, as well as reducing stroke and  heart attack risk.   - It helps your bones and helps with mood.  - It also decreases numerous cancer risks so please  take it as directed.   - Low Vit D is associated with a 200-300% higher risk for  CANCER   and 200-300% higher risk for HEART   ATTACK  &  STROKE.    - It is also associated with higher death rate at younger ages,   autoimmune diseases like Rheumatoid arthritis, Lupus,  Multiple Sclerosis.     - Also many other serious conditions, like depression, Alzheimer's  Dementia, infertility, muscle aches, fatigue, fibromyalgia   - just to name a few. <><><><><><><><><><><><><><><><><><><><><><><><><><><><><><><><><> <><><><><><><><><><><><><><><><><><><><><><><><><><><><><><><><><>  - All Else - CBC - Kidneys - Electrolytes - Liver - Magnesium & Thyroid    - all  Normal /  OK  <><><><><><><><><><><><><><><><><><><><><><><><><><><><><><><><><> <><><><><><><><><><><><><><><><><><><><><><><><><><><><><><><><><>

## 2021-07-26 ENCOUNTER — Ambulatory Visit (INDEPENDENT_AMBULATORY_CARE_PROVIDER_SITE_OTHER): Payer: BC Managed Care – PPO | Admitting: Orthopaedic Surgery

## 2021-07-26 ENCOUNTER — Ambulatory Visit: Payer: Self-pay

## 2021-07-26 ENCOUNTER — Ambulatory Visit (INDEPENDENT_AMBULATORY_CARE_PROVIDER_SITE_OTHER): Payer: BC Managed Care – PPO

## 2021-07-26 DIAGNOSIS — Z96652 Presence of left artificial knee joint: Secondary | ICD-10-CM

## 2021-07-26 DIAGNOSIS — Z96651 Presence of right artificial knee joint: Secondary | ICD-10-CM

## 2021-07-26 DIAGNOSIS — Z96653 Presence of artificial knee joint, bilateral: Secondary | ICD-10-CM

## 2021-07-26 DIAGNOSIS — Z471 Aftercare following joint replacement surgery: Secondary | ICD-10-CM | POA: Diagnosis not present

## 2021-07-26 MED ORDER — TRAMADOL HCL 50 MG PO TABS: 50.0000 mg | ORAL_TABLET | Freq: Two times a day (BID) | ORAL | 2 refills | Status: DC | PRN

## 2021-07-26 MED ORDER — IBUPROFEN 600 MG PO TABS: 600.0000 mg | ORAL_TABLET | Freq: Three times a day (TID) | ORAL | 2 refills | Status: DC | PRN

## 2021-07-26 NOTE — Progress Notes (Signed)
Post-Op Visit Note   Patient: Brett [name]           Date of Birth: Jul 24, 1960           MRN: [id number] Visit Date: 07/26/2021 PCP: Lucky Cowboy, MD   Assessment & Plan:  Chief Complaint:  Chief Complaint  Patient presents with   Left Knee - Follow-up    Left TKA 01/2019   Right Knee - Follow-up    Right TKA 08/2019   Visit Diagnoses:  1. H/O total knee replacement, right   2. H/O total knee replacement, left     Plan: Patient is a pleasant 61 year old gentleman who comes in today following bilateral total knee replacements: Left total knee replacement 01/24/2019 and right total knee replacement 08/04/2019.  He has had continued pain to both knees with associated instability of the left knee.  He has been wearing a hinged knee brace to the left knee but most recently knee sleeves to both as the brace is worn out.  Symptoms on the left are primarily to the lateral aspect and on the right throughout the entire knee.  He denies giving way sensations on the left.  He has been taking tramadol and ibuprofen with some relief.  He denies any fevers or chills or any other systemic symptoms.  Examination of both knees reveal no effusion.  Range of motion 0 to 120 degrees.  He is stable valgus varus stress both sides with a little laxity to varus stress on the left.  He does have mild lateral joint line tenderness on the left.  He is neurovascular intact distally.  At this point, it is hard to tell what is causing his continued knee pain.  He has no history of lumbar hip pathology.  His x-rays are unremarkable.  Today, we will go ahead and order lab work for inflammatory markers and we will go ahead and order three-phase bone scan for both knees.  He will follow-up with [place] once that has been completed.  Call with concerns or questions in the meantime.  Follow-Up Instructions: Return for after bone scan.   Orders:  Orders Placed This Encounter  Procedures   XR Knee 1-2 Views Right   XR Knee  1-2 Views Left   No orders of the defined types were placed in this encounter.   Imaging: No results found.  PMFS History: Patient Active Problem List   Diagnosis Date Noted   Status post total right knee replacement 10/19/2020   Status post total knee replacement, right 08/04/2019   Status post total left knee replacement 01/24/2019   Overweight (BMI 25.0-29.9) 08/08/2017   Medication management 03/23/2014   Vitamin D deficiency 03/23/2014   Abnormal glucose 03/23/2014   Hyperlipemia 10/31/2006   GERD 10/31/2006   Past Medical History:  Diagnosis Date   Allergy    albuterol as needed   Anal fissure    Arthritis    Colon polyps 01/13/11   Colonoscopy   GERD (gastroesophageal reflux disease)    Pneumonia    Pre-diabetes     Family History  Problem Relation Age of Onset   Rheum arthritis Mother    Heart disease Mother    Diabetes Mother    Arthritis Mother    Diabetes Father    Blindness Father        from DM?   Allergies Daughter    Allergies Sister    Asthma Daughter     Past Surgical History:  Procedure Laterality Date  ANAL FISSURE REPAIR  02/24/11   CHONDROPLASTY  08/14/2018   Procedure: CHONDROPLASTY;  Surgeon: Tarry Kos, MD;  Location: [facility];  Service: Orthopedics;;   COLONOSCOPY     KNEE ARTHROSCOPY WITH MEDIAL MENISECTOMY Left 08/14/2018   Procedure: LEFT KNEE ARTHROSCOPY WITH PARTIAL MEDIAL MENISCECTOMY;  Surgeon: Tarry Kos, MD;  Location: [facility];  Service: Orthopedics;  Laterality: Left;   KNEE ARTHROSCOPY WITH MEDIAL MENISECTOMY Right 10/18/2018   Procedure: RIGHT KNEE ARTHROSCOPY WITH PARTIAL MEDIAL MENISCECTOMY;  Surgeon: Tarry Kos, MD;  Location: [facility];  Service: Orthopedics;  Laterality: Right;   TOTAL KNEE ARTHROPLASTY Left 01/24/2019   Procedure: LEFT TOTAL KNEE ARTHROPLASTY;  Surgeon: Tarry Kos, MD;  Location: [facility];  Service: Orthopedics;  Laterality: Left;   TOTAL KNEE  ARTHROPLASTY Right 08/04/2019   Procedure: RIGHT TOTAL KNEE ARTHROPLASTY;  Surgeon: Tarry Kos, MD;  Location: [facility];  Service: Orthopedics;  Laterality: Right;   Social History   Occupational History    Employer: [employer]  Tobacco Use   Smoking status: Former    Packs/day: 0.25    Years: 27.00    Total pack years: 6.75    Types: Cigarettes    Quit date: 08/20/2018    Years since quitting: 2.9   Smokeless tobacco: Never  Vaping Use   Vaping Use: Never used  Substance and Sexual Activity   Alcohol use: Yes    Comment: socially   Drug use: No   Sexual activity: Not on file

## 2021-07-27 LAB — CBC WITH DIFFERENTIAL/PLATELET
Absolute Monocytes: 550 cells/uL (ref 200–950)
Basophils Absolute: 39 cells/uL (ref 0–200)
Basophils Relative: 0.7 %
Eosinophils Absolute: 121 cells/uL (ref 15–500)
Eosinophils Relative: 2.2 %
HCT: 43.5 % (ref 38.5–50.0)
Hemoglobin: 14.1 g/dL (ref 13.2–17.1)
Lymphs Abs: 2140 cells/uL (ref 850–3900)
MCH: 26.9 pg — ABNORMAL LOW (ref 27.0–33.0)
MCHC: 32.4 g/dL (ref 32.0–36.0)
MCV: 82.9 fL (ref 80.0–100.0)
MPV: 10.3 fL (ref 7.5–12.5)
Monocytes Relative: 10 %
Neutro Abs: 2651 cells/uL (ref 1500–7800)
Neutrophils Relative %: 48.2 %
Platelets: 264 10*3/uL (ref 140–400)
RBC: 5.25 10*6/uL (ref 4.20–5.80)
RDW: 12.3 % (ref 11.0–15.0)
Total Lymphocyte: 38.9 %
WBC: 5.5 10*3/uL (ref 3.8–10.8)

## 2021-07-27 LAB — SEDIMENTATION RATE: Sed Rate: 2 mm/h (ref 0–20)

## 2021-07-27 LAB — C-REACTIVE PROTEIN: CRP: 1.9 mg/L (ref <8.0)

## 2021-08-09 ENCOUNTER — Telehealth: Payer: Self-pay | Admitting: Orthopaedic Surgery

## 2021-08-09 NOTE — Telephone Encounter (Signed)
Called patient no answer and no answering machine pickup      will try back

## 2021-08-10 ENCOUNTER — Ambulatory Visit (HOSPITAL_COMMUNITY)
Admission: EM | Admit: 2021-08-10 | Discharge: 2021-08-10 | Disposition: A | Discharge status: Home / Self Care | Payer: BC Managed Care – PPO | Attending: Emergency Medicine | Admitting: Emergency Medicine

## 2021-08-10 ENCOUNTER — Encounter (HOSPITAL_COMMUNITY): Payer: Self-pay | Admitting: Emergency Medicine

## 2021-08-10 DIAGNOSIS — T63441A Toxic effect of venom of bees, accidental (unintentional), initial encounter: Secondary | ICD-10-CM

## 2021-08-10 MED ORDER — METHYLPREDNISOLONE SODIUM SUCC 125 MG IJ SOLR
80.0000 mg | Freq: Once | INTRAMUSCULAR | Status: AC
  Administered 2021-08-10: 80 mg via INTRAMUSCULAR

## 2021-08-10 MED ORDER — METHYLPREDNISOLONE SODIUM SUCC 125 MG IJ SOLR
INTRAMUSCULAR | Status: AC
  Filled 2021-08-10: qty 2

## 2021-08-10 NOTE — Discharge Instructions (Signed)
During your visit today, you received an injection of steroid which is significantly calm your immune system and prevent any further reaction to the bee stings he received today.  Please do continue taking Benadryl 25 mg every 6 hours around-the-clock until Saturday.  You can also take ibuprofen 600 mg 3 times daily as needed for pain.  Please monitor yourself for symptoms of shortness of breath, increased redness and swelling around the sites where your stone, swelling of your lips, swelling of your tongue.  If any of these occur, please go to the emergency room for further evaluation and treatment.  Thank you for visiting urgent care today.

## 2021-08-10 NOTE — ED Provider Notes (Signed)
[place]    CSN: [id number] Arrival date & time: 08/10/21  1814    HISTORY   Chief Complaint  Patient presents with   Insect Bite   HPI Brett [name] is a pleasant, 61 y.o. male who presents to urgent care today. Pt states he was cutting down a tree PTA when a swarm of small black bee's began stinging him.  States took one benadryl and cleaned the sites with alcohol then came here. Denies feeling SOB or presence of swelling or hives. C/o mostly of pain at sites of injury.  Vital signs are normal on arrival today.  The history is provided by the patient.   Past Medical History:  Diagnosis Date   Allergy    albuterol as needed   Anal fissure    Arthritis    Colon polyps 01/13/11   Colonoscopy   GERD (gastroesophageal reflux disease)    Pneumonia    Pre-diabetes    Patient Active Problem List   Diagnosis Date Noted   Status post total right knee replacement 10/19/2020   Status post total knee replacement, right 08/04/2019   Status post total left knee replacement 01/24/2019   Overweight (BMI 25.0-29.9) 08/08/2017   Medication management 03/23/2014   Vitamin D deficiency 03/23/2014   Abnormal glucose 03/23/2014   Hyperlipemia 10/31/2006   GERD 10/31/2006   Past Surgical History:  Procedure Laterality Date   ANAL FISSURE REPAIR  02/24/11   CHONDROPLASTY  08/14/2018   Procedure: CHONDROPLASTY;  Surgeon: Leandrew Koyanagi, MD;  Location: [place];  Service: Orthopedics;;   COLONOSCOPY     KNEE ARTHROSCOPY WITH MEDIAL MENISECTOMY Left 08/14/2018   Procedure: LEFT KNEE ARTHROSCOPY WITH PARTIAL MEDIAL MENISCECTOMY;  Surgeon: Leandrew Koyanagi, MD;  Location: [facility];  Service: Orthopedics;  Laterality: Left;   KNEE ARTHROSCOPY WITH MEDIAL MENISECTOMY Right 10/18/2018   Procedure: RIGHT KNEE ARTHROSCOPY WITH PARTIAL MEDIAL MENISCECTOMY;  Surgeon: Leandrew Koyanagi, MD;  Location: [place];  Service: Orthopedics;   Laterality: Right;   TOTAL KNEE ARTHROPLASTY Left 01/24/2019   Procedure: LEFT TOTAL KNEE ARTHROPLASTY;  Surgeon: Leandrew Koyanagi, MD;  Location: [place];  Service: Orthopedics;  Laterality: Left;   TOTAL KNEE ARTHROPLASTY Right 08/04/2019   Procedure: RIGHT TOTAL KNEE ARTHROPLASTY;  Surgeon: Leandrew Koyanagi, MD;  Location: [place];  Service: Orthopedics;  Laterality: Right;    Home Medications    Prior to Admission medications   Medication Sig Start Date End Date Taking? Authorizing Provider  blood glucose meter kit and supplies Dispense based insurance preference. E11.22 CHECK BLOOD SUGAR ONCE DAILY 09/25/19   Garnet Sierras, NP  Cholecalciferol (VITAMIN D-3) 125 MCG (5000 UT) TABS Take 10,000 Units by mouth daily.    [provider]  Continuous Blood Gluc Receiver (FREESTYLE LIBRE 14 DAY READER) DEVI 1 Device by Does not apply route every 14 (fourteen) days. 08/26/19   Garnet Sierras, NP  gabapentin (NEURONTIN) 800 MG tablet Take  1 tablet  3 to 4 x /day  for Severe Pain 01/25/21   Unk Pinto, MD  glucose blood test strip Test blood sugar once daily 03/24/20   Garnet Sierras, NP  ibuprofen (ADVIL) 600 MG tablet Take 1 tablet (600 mg total) by mouth 3 (three) times daily as needed. 07/26/21   Aundra Dubin, PA-C  methocarbamol (ROBAXIN) 750 MG tablet Take  1/2 to 1 tablet  2 to 3 x /day  if needed for Muscle Spasm 02/09/21  Unk Pinto, MD  promethazine (PHENERGAN) 12.5 MG tablet Take 1 tablet (12.5 mg total) by mouth every 6 (six) hours as needed for nausea or vomiting. 08/08/19   Aundra Dubin, PA-C  sildenafil (VIAGRA) 100 MG tablet Take 1 tablet (100 mg total) by mouth as needed for erectile dysfunction. 10/13/19 02/09/21  Garnet Sierras, NP  traMADol (ULTRAM) 50 MG tablet Take 1 tablet (50 mg total) by mouth 2 (two) times daily as needed. 07/26/21   Aundra Dubin, PA-C    Family History Family History  Problem Relation Age of Onset   Rheum arthritis Mother    Heart  disease Mother    Diabetes Mother    Arthritis Mother    Diabetes Father    Blindness Father        from DM?   Allergies Daughter    Allergies Sister    Asthma Daughter    Social History Social History   Tobacco Use   Smoking status: Former    Packs/day: 0.25    Years: 27.00    Total pack years: 6.75    Types: Cigarettes    Quit date: 08/20/2018    Years since quitting: 2.9   Smokeless tobacco: Never  Vaping Use   Vaping Use: Never used  Substance Use Topics   Alcohol use: Yes    Comment: socially   Drug use: No   Allergies   Benzodiazepines, Chantix  [varenicline], Cialis [tadalafil], Dicyclomine, Meloxicam, Penicillins, and Relafen [nabumetone]  Review of Systems Review of Systems Pertinent findings revealed after performing a 14 point review of systems has been noted in the history of present illness.  Physical Exam Triage Vital Signs ED Triage Vitals  Enc Vitals Group     BP 10/29/20 0827 (!) 147/82     Pulse Rate 10/29/20 0827 72     Resp 10/29/20 0827 18     Temp 10/29/20 0827 98.3 F (36.8 C)     Temp Source 10/29/20 0827 Oral     SpO2 10/29/20 0827 98 %     Weight --      Height --      Head Circumference --      Peak Flow --      Pain Score 10/29/20 0826 5     Pain Loc --      Pain Edu? --      Excl. in [place]? --   No data found.  Updated Vital Signs BP (!) 140/77 (BP Location: Right Arm)   Pulse 83   Temp 98.3 F (36.8 C) (Oral)   Resp 16   SpO2 100%   Physical Exam Vitals and nursing note reviewed.  Constitutional:      General: He is not in acute distress.    Appearance: Normal appearance. He is normal weight. He is not ill-appearing.  HENT:     Head: Normocephalic and atraumatic.  Eyes:     Extraocular Movements: Extraocular movements intact.     Conjunctiva/sclera: Conjunctivae normal.     Pupils: Pupils are equal, round, and reactive to light.  Cardiovascular:     Rate and Rhythm: Normal rate and regular rhythm.  Pulmonary:      Effort: Pulmonary effort is normal.     Breath sounds: Normal breath sounds.  Musculoskeletal:        General: Normal range of motion.     Cervical back: Normal range of motion and neck supple.  Skin:    General: Skin is warm and dry.  Findings: Lesion (Multiple small lesions scattered across back without significant swelling, erythema or induration.) present.  Neurological:     General: No focal deficit present.     Mental Status: He is alert and oriented to person, place, and time. Mental status is at baseline.  Psychiatric:        Mood and Affect: Mood normal.        Behavior: Behavior normal.        Thought Content: Thought content normal.        Judgment: Judgment normal.     Visual Acuity Right Eye Distance:   Left Eye Distance:   Bilateral Distance:    Right Eye Near:   Left Eye Near:    Bilateral Near:     UC Couse / Diagnostics / Procedures:     Radiology No results found.  Procedures Procedures (including critical care time) EKG  Pending results:  Labs Reviewed - No data to display  Medications Ordered in UC: Medications  methylPREDNISolone sodium succinate (SOLU-MEDROL) 125 mg/2 mL injection 80 mg (has no administration in time range)    UC Diagnoses / Final Clinical Impressions(s)   I have reviewed the triage vital signs and the nursing notes.  Pertinent labs & imaging results that were available during my care of the patient were reviewed by me and considered in my medical decision making (see chart for details).    Final diagnoses:  Bee sting, accidental or unintentional, initial encounter   Despite acute lack of evidence of acute allergic reaction to multiple reported bee stings, patient reports that he is in acute pain and is requesting an injection for pain relief during his visit today.  Patient provided with an injection of methylprednisolone in hopes of reducing his inflammatory response to multiple bee stings.  Patient was advised to  monitor for signs of anaphylaxis and to continue Benadryl around-the-clock.  Patient was also advised that he can take ibuprofen 600 mg 3 times daily for pain.  ED Prescriptions   None    PDMP not reviewed this encounter.  Pending results:  Labs Reviewed - No data to display  Discharge Instructions:   Discharge Instructions      During your visit today, you received an injection of steroid which is significantly calm your immune system and prevent any further reaction to the bee stings he received today.  Please do continue taking Benadryl 25 mg every 6 hours around-the-clock until Saturday.  You can also take ibuprofen 600 mg 3 times daily as needed for pain.  Please monitor yourself for symptoms of shortness of breath, increased redness and swelling around the sites where your stone, swelling of your lips, swelling of your tongue.  If any of these occur, please go to the emergency room for further evaluation and treatment.  Thank you for visiting urgent care today.      Disposition Upon Discharge:  Condition: stable for discharge home  Patient presented with an acute illness with associated systemic symptoms and significant discomfort requiring urgent management. In my opinion, this is a condition that a prudent lay person (someone who possesses an average knowledge of health and medicine) may potentially expect to result in complications if not addressed urgently such as respiratory distress, impairment of bodily function or dysfunction of bodily organs.   Routine symptom specific, illness specific and/or disease specific instructions were discussed with the patient and/or caregiver at length.   As such, the patient has been evaluated and assessed, work-up was performed and  treatment was provided in alignment with urgent care protocols and evidence based medicine.  Patient/parent/caregiver has been advised that the patient may require follow up for further testing and treatment  if the symptoms continue in spite of treatment, as clinically indicated and appropriate.  Patient/parent/caregiver has been advised to return to the [facility] or PCP if no better; to PCP or the Emergency Department if new signs and symptoms develop, or if the current signs or symptoms continue to change or worsen for further workup, evaluation and treatment as clinically indicated and appropriate  The patient will follow up with their current PCP if and as advised. If the patient does not currently have a PCP we will assist them in obtaining one.   The patient may need specialty follow up if the symptoms continue, in spite of conservative treatment and management, for further workup, evaluation, consultation and treatment as clinically indicated and appropriate.   Patient/parent/caregiver verbalized understanding and agreement of plan as discussed.  All questions were addressed during visit.  Please see discharge instructions below for further details of plan.  This office note has been dictated using Museum/gallery curator.  Unfortunately, this method of dictation can sometimes lead to typographical or grammatical errors.  I apologize for your inconvenience in advance if this occurs.  Please do not hesitate to reach out to me if clarification is needed.      Lynden Oxford Scales, PA-C 08/10/21 1845

## 2021-08-10 NOTE — ED Triage Notes (Signed)
Was cutting down a tree when a swarm of small black bee's began stinging him. Took one benadryl and cleaned the sites with alcohol and came here. Denies any feelings of SOB or hives. C/o mostly of pain at sites of injury.

## 2021-08-15 ENCOUNTER — Encounter (HOSPITAL_COMMUNITY)
Admission: RE | Admit: 2021-08-15 | Discharge: 2021-08-15 | Disposition: A | Discharge status: Home / Self Care | Payer: BC Managed Care – PPO | Source: Ambulatory Visit | Attending: Orthopaedic Surgery | Admitting: Orthopaedic Surgery

## 2021-08-15 DIAGNOSIS — Z96651 Presence of right artificial knee joint: Secondary | ICD-10-CM | POA: Diagnosis present

## 2021-08-15 DIAGNOSIS — Z96652 Presence of left artificial knee joint: Secondary | ICD-10-CM | POA: Insufficient documentation

## 2021-08-15 MED ORDER — TECHNETIUM TC 99M MEDRONATE IV KIT
20.0000 | PACK | Freq: Once | INTRAVENOUS | Status: AC | PRN
  Administered 2021-08-15: 21.1 via INTRAVENOUS

## 2021-08-16 NOTE — Progress Notes (Signed)
Needs appt please.  Thanks.

## 2021-08-17 NOTE — Progress Notes (Signed)
Called patient. No answer. LMOM for him to call our office to schedule follow up to discuss his scan results.

## 2021-08-22 ENCOUNTER — Encounter (HOSPITAL_COMMUNITY): Payer: Self-pay | Admitting: *Deleted

## 2021-08-22 ENCOUNTER — Ambulatory Visit (HOSPITAL_COMMUNITY)
Admission: EM | Admit: 2021-08-22 | Discharge: 2021-08-22 | Disposition: A | Discharge status: Home / Self Care | Payer: BC Managed Care – PPO | Attending: Urgent Care | Admitting: Urgent Care

## 2021-08-22 DIAGNOSIS — T7840XA Allergy, unspecified, initial encounter: Secondary | ICD-10-CM

## 2021-08-22 DIAGNOSIS — T63441A Toxic effect of venom of bees, accidental (unintentional), initial encounter: Secondary | ICD-10-CM | POA: Diagnosis not present

## 2021-08-22 DIAGNOSIS — L03114 Cellulitis of left upper limb: Secondary | ICD-10-CM | POA: Diagnosis not present

## 2021-08-22 DIAGNOSIS — L03112 Cellulitis of left axilla: Secondary | ICD-10-CM | POA: Diagnosis not present

## 2021-08-22 DIAGNOSIS — L03113 Cellulitis of right upper limb: Secondary | ICD-10-CM

## 2021-08-22 DIAGNOSIS — L03119 Cellulitis of unspecified part of limb: Secondary | ICD-10-CM

## 2021-08-22 MED ORDER — CEPHALEXIN 500 MG PO CAPS: 500.0000 mg | ORAL_CAPSULE | Freq: Four times a day (QID) | ORAL | 0 refills | Status: AC

## 2021-08-22 MED ORDER — METHYLPREDNISOLONE SODIUM SUCC 125 MG IJ SOLR
80.0000 mg | Freq: Once | INTRAMUSCULAR | Status: AC
  Administered 2021-08-22: 80 mg via INTRAMUSCULAR

## 2021-08-22 MED ORDER — PREDNISONE 50 MG PO TABS
50.0000 mg | ORAL_TABLET | Freq: Every day | ORAL | 0 refills | Status: AC
Start: 2021-08-22 — End: 2021-08-26

## 2021-08-22 MED ORDER — STERILE WATER FOR INJECTION IJ SOLN
INTRAMUSCULAR | Status: AC
  Filled 2021-08-22: qty 10

## 2021-08-22 MED ORDER — METHYLPREDNISOLONE SODIUM SUCC 125 MG IJ SOLR
INTRAMUSCULAR | Status: AC
  Filled 2021-08-22: qty 2

## 2021-08-22 NOTE — ED Provider Notes (Signed)
[place]    CSN: [id number] Arrival date & time: 08/22/21  1646      History   Chief Complaint Chief Complaint  Patient presents with   Insect Bite    HPI Brett [name] is a 61 y.o. male.   Pleasant 61 year old male presents today after being stung at least 3 times by yellow jackets on Saturday.  He was seen 2 weeks ago for similar episode.  He was trying to cut down a tree and got stung once on the left forearm once on the right forearm and once in the left axilla.  He reports the area is red, swollen, and itchy.  He has been using topical cream and over-the-counter Benadryl without resolution.  He denies any systemic symptoms, including no swelling of the throat, lips, dysphagia, wheezing, shortness of breath.    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   Status post total knee replacement, right 08/04/2019   Status post total left knee replacement 01/24/2019   Overweight (BMI 25.0-29.9) 08/08/2017   Medication management 03/23/2014   Vitamin D deficiency 03/23/2014   Abnormal glucose 03/23/2014   Hyperlipemia 10/31/2006   GERD 10/31/2006    Past Surgical History:  Procedure Laterality Date   ANAL FISSURE REPAIR  02/24/11   CHONDROPLASTY  08/14/2018   Procedure: CHONDROPLASTY;  Surgeon: Leandrew Koyanagi, MD;  Location: [facility];  Service: Orthopedics;;   COLONOSCOPY     KNEE ARTHROSCOPY WITH MEDIAL MENISECTOMY Left 08/14/2018   Procedure: LEFT KNEE ARTHROSCOPY WITH PARTIAL MEDIAL MENISCECTOMY;  Surgeon: Leandrew Koyanagi, MD;  Location: [facility];  Service: Orthopedics;  Laterality: Left;   KNEE ARTHROSCOPY WITH MEDIAL MENISECTOMY Right 10/18/2018    Procedure: RIGHT KNEE ARTHROSCOPY WITH PARTIAL MEDIAL MENISCECTOMY;  Surgeon: Leandrew Koyanagi, MD;  Location: [facility];  Service: Orthopedics;  Laterality: Right;   TOTAL KNEE ARTHROPLASTY Left 01/24/2019   Procedure: LEFT TOTAL KNEE ARTHROPLASTY;  Surgeon: Leandrew Koyanagi, MD;  Location: [facility];  Service: Orthopedics;  Laterality: Left;   TOTAL KNEE ARTHROPLASTY Right 08/04/2019   Procedure: RIGHT TOTAL KNEE ARTHROPLASTY;  Surgeon: Leandrew Koyanagi, MD;  Location: [facility];  Service: Orthopedics;  Laterality: Right;       Home Medications    Prior to Admission medications   Medication Sig Start Date End Date Taking? Authorizing Provider  blood glucose meter kit and supplies Dispense based insurance preference. E11.22 CHECK BLOOD SUGAR ONCE DAILY 09/25/19  Yes McClanahan, Kyra, NP  cephALEXin (KEFLEX) 500 MG capsule Take 1 capsule (500 mg total) by mouth 4 (four) times daily for 5 days. 08/22/21 08/27/21 Yes Riven Beebe L, PA  Cholecalciferol (VITAMIN D-3) 125 MCG (5000 UT) TABS Take 10,000 Units by mouth daily.   Yes [provider]  Continuous Blood Gluc Receiver (FREESTYLE LIBRE 14 DAY READER) DEVI 1 Device by Does not apply route every 14 (fourteen) days. 08/26/19  Yes McClanahan, Danton Sewer, NP  gabapentin (NEURONTIN) 800 MG tablet Take  1 tablet  3 to 4 x /day  for Severe Pain 01/25/21  Yes Unk Pinto, MD  glucose blood test strip  Test blood sugar once daily 03/24/20  Yes McClanahan, Kyra, NP  ibuprofen (ADVIL) 600 MG tablet Take 1 tablet (600 mg total) by mouth 3 (three) times daily as needed. 07/26/21  Yes Aundra Dubin, PA-C  methocarbamol (ROBAXIN) 750 MG tablet Take  1/2 to 1 tablet  2 to 3 x /day  if needed for Muscle Spasm 02/09/21  Yes Unk Pinto, MD  predniSONE (DELTASONE) 50 MG tablet Take 1 tablet (50 mg total) by mouth daily with breakfast for 4 days. 08/22/21 08/26/21 Yes Jasiel Apachito L, PA  promethazine (PHENERGAN) 12.5 MG tablet Take 1 tablet (12.5 mg  total) by mouth every 6 (six) hours as needed for nausea or vomiting. 08/08/19  Yes Aundra Dubin, PA-C  traMADol (ULTRAM) 50 MG tablet Take 1 tablet (50 mg total) by mouth 2 (two) times daily as needed. 07/26/21  Yes Aundra Dubin, PA-C  sildenafil (VIAGRA) 100 MG tablet Take 1 tablet (100 mg total) by mouth as needed for erectile dysfunction. 10/13/19 02/09/21  Garnet Sierras, NP    Family History Family History  Problem Relation Age of Onset   Rheum arthritis Mother    Heart disease Mother    Diabetes Mother    Arthritis Mother    Diabetes Father    Blindness Father        from DM?   Allergies Daughter    Allergies Sister    Asthma Daughter     Social History Social History   Tobacco Use   Smoking status: Former    Packs/day: 0.25    Years: 27.00    Total pack years: 6.75    Types: Cigarettes    Quit date: 08/20/2018    Years since quitting: 3.0   Smokeless tobacco: Never  Vaping Use   Vaping Use: Never used  Substance Use Topics   Alcohol use: Yes    Comment: socially   Drug use: No     Allergies   Benzodiazepines, Chantix  [varenicline], Cialis [tadalafil], Dicyclomine, Meloxicam, Penicillins, and Relafen [nabumetone]   Review of Systems Review of Systems  Skin:  Positive for rash.  As per HPI   Physical Exam Triage Vital Signs ED Triage Vitals  Enc Vitals Group     BP 08/22/21 1743 (!) 141/92     Pulse Rate 08/22/21 1743 83     Resp 08/22/21 1743 18     Temp 08/22/21 1743 98.1 F (36.7 C)     Temp Source 08/22/21 1743 Oral     SpO2 08/22/21 1743 99 %     Weight --      Height --      Head Circumference --      Peak Flow --      Pain Score 08/22/21 1742 0     Pain Loc --      Pain Edu? --      Excl. in [place]? --    No data found.  Updated Vital Signs BP (!) 141/92 (BP Location: Right Arm)   Pulse 83   Temp 98.1 F (36.7 C) (Oral)   Resp 18   SpO2 99%   Visual Acuity Right Eye Distance:   Left Eye Distance:   Bilateral Distance:     Right Eye Near:   Left Eye Near:    Bilateral Near:     Physical Exam Vitals and nursing note reviewed.  Constitutional:      General: He is not in acute distress.    Appearance: Normal  appearance. He is not ill-appearing or toxic-appearing.  HENT:     Head: Normocephalic and atraumatic.     Right Ear: External ear normal.     Left Ear: External ear normal.     Nose: Nose normal. No congestion or rhinorrhea.     Mouth/Throat:     Mouth: Mucous membranes are moist.     Pharynx: Oropharynx is clear. No oropharyngeal exudate or posterior oropharyngeal erythema.     Comments: No swelling of uvula Eyes:     General: No scleral icterus.    Extraocular Movements: Extraocular movements intact.     Pupils: Pupils are equal, round, and reactive to light.  Cardiovascular:     Rate and Rhythm: Normal rate.  Pulmonary:     Effort: Pulmonary effort is normal. No respiratory distress.     Breath sounds: Normal breath sounds. No stridor. No wheezing or rhonchi.  Skin:    General: Skin is warm and dry.     Capillary Refill: Capillary refill takes less than 2 seconds.     Findings: Erythema present.     Comments: Small erythematous area to L axilla, without warmth or swelling. L dorsal forearm with erythematous, raised, warm indurated lesion R dorsal forearm with larger erythematous, raised, warm indurated lesion   Neurological:     General: No focal deficit present.     Mental Status: He is alert and oriented to person, place, and time.     Motor: No weakness.     UC Treatments / Results  Labs (all labs ordered are listed, but only abnormal results are displayed) Labs Reviewed - No data to display  EKG   Radiology No results found.  Procedures Procedures (including critical care time)  Medications Ordered in UC Medications  methylPREDNISolone sodium succinate (SOLU-MEDROL) 125 mg/2 mL injection 80 mg (80 mg Intramuscular Given 08/22/21 1826)    Initial Impression /  Assessment and Plan / UC Course  I have reviewed the triage vital signs and the nursing notes.  Pertinent labs & imaging results that were available during my care of the patient were reviewed by me and considered in my medical decision making (see chart for details).     Allergic reaction to bee sting - was seen recently for something similar.  Had IM steroid injection in office last time with no p.o. medications.  I am concerned his current findings are more significant, with developing cellulitis as well.  Does have a history of penicillin allergy listed in his chart, reports mild symptoms years ago, and has tolerated Ancef in the past.  Feel it is safe to proceed with Keflex. Cellulitis - as above  Final Clinical Impressions(s) / UC Diagnoses   Final diagnoses:  Allergic reaction, initial encounter  Bee sting, accidental or unintentional, initial encounter  Cellulitis of upper extremity, unspecified laterality     Discharge Instructions      You are given an injection of Solu-Medrol in our office today. This is a steroid which should help with the swelling and inflammation and itching. Please start taking the prednisone once daily in the morning starting tomorrow. You can continue Benadryl if it remains itchy. Please discontinue the ibuprofen.   Please also start taking the cephalexin 4 times daily for the next 5 days.      ED Prescriptions     Medication Sig Dispense Auth. Provider   cephALEXin (KEFLEX) 500 MG capsule Take 1 capsule (500 mg total) by mouth 4 (four) times daily for 5  days. 20 capsule Shelisha Gautier L, PA   predniSONE (DELTASONE) 50 MG tablet Take 1 tablet (50 mg total) by mouth daily with breakfast for 4 days. 4 tablet Tishie Altmann L, [place]      PDMP not reviewed this encounter.   Chaney Malling, [place] 08/22/21 2123

## 2021-08-22 NOTE — Discharge Instructions (Addendum)
You are given an injection of Solu-Medrol in our office today. This is a steroid which should help with the swelling and inflammation and itching. Please start taking the prednisone once daily in the morning starting tomorrow. You can continue Benadryl if it remains itchy. Please discontinue the ibuprofen.   Please also start taking the cephalexin 4 times daily for the next 5 days.

## 2021-08-22 NOTE — ED Triage Notes (Signed)
Patient states that he was stung by bees Saturday evening, he is still itching and swelling from the stings. He is using benadryl, itch cream OTC and alcohol to try and help.

## 2021-08-30 ENCOUNTER — Ambulatory Visit (INDEPENDENT_AMBULATORY_CARE_PROVIDER_SITE_OTHER): Payer: BC Managed Care – PPO | Admitting: Orthopaedic Surgery

## 2021-08-30 ENCOUNTER — Encounter: Payer: Self-pay | Admitting: Orthopaedic Surgery

## 2021-08-30 DIAGNOSIS — Z96652 Presence of left artificial knee joint: Secondary | ICD-10-CM | POA: Diagnosis not present

## 2021-08-30 NOTE — Progress Notes (Signed)
Office Visit Note   Patient: Brett [name]           Date of Birth: 1960-11-27           MRN: [id number] Visit Date: 08/30/2021              Requested by: Lucky Cowboy, MD [address] [place],  [place] [address] PCP: Lucky Cowboy, MD   Assessment & Plan: Visit Diagnoses:  1. Status post total left knee replacement     Plan: Bone scan is negative for loosening.  At this point based on the the findings I feel that his symptoms are due to instability due to the poly.  I have recommended upsizing the poly which will hopefully resolve his symptoms.  He will think about his options and let me know how he wants to proceed.  Follow-Up Instructions: No follow-ups on file.   Orders:  No orders of the defined types were placed in this encounter.  No orders of the defined types were placed in this encounter.     Procedures: No procedures performed   Clinical Data: No additional findings.   Subjective: Chief Complaint  Patient presents with   Right Knee - Follow-up    Bone scan review   Left Knee - Follow-up    HPI Brett [name] is here to discuss bone scan.    Review of Systems   Objective: Vital Signs: There were no vitals taken for this visit.  Physical Exam  Ortho Exam Examination of left knee shows a fully healed surgical scar.  He has a moderate joint effusion.  He has greater than 5 mm of varus valgus laxity.  Baker's cyst is present. Specialty Comments:  No specialty comments available.  Imaging: No results found.   PMFS History: Patient Active Problem List   Diagnosis Date Noted   Status post total right knee replacement 10/19/2020   Status post total knee replacement, right 08/04/2019   Status post total left knee replacement 01/24/2019   Overweight (BMI 25.0-29.9) 08/08/2017   Medication management 03/23/2014   Vitamin D deficiency 03/23/2014   Abnormal glucose 03/23/2014   Hyperlipemia 10/31/2006   GERD 10/31/2006    Past Medical History:  Diagnosis Date   Allergy    albuterol as needed   Anal fissure    Arthritis    Colon polyps 01/13/11   Colonoscopy   GERD (gastroesophageal reflux disease)    Pneumonia    Pre-diabetes     Family History  Problem Relation Age of Onset   Rheum arthritis Mother    Heart disease Mother    Diabetes Mother    Arthritis Mother    Diabetes Father    Blindness Father        from DM?   Allergies Daughter    Allergies Sister    Asthma Daughter     Past Surgical History:  Procedure Laterality Date   ANAL FISSURE REPAIR  02/24/11   CHONDROPLASTY  08/14/2018   Procedure: CHONDROPLASTY;  Surgeon: Tarry Kos, MD;  Location: [facility];  Service: Orthopedics;;   COLONOSCOPY     KNEE ARTHROSCOPY WITH MEDIAL MENISECTOMY Left 08/14/2018   Procedure: LEFT KNEE ARTHROSCOPY WITH PARTIAL MEDIAL MENISCECTOMY;  Surgeon: Tarry Kos, MD;  Location: [facility];  Service: Orthopedics;  Laterality: Left;   KNEE ARTHROSCOPY WITH MEDIAL MENISECTOMY Right 10/18/2018   Procedure: RIGHT KNEE ARTHROSCOPY WITH PARTIAL MEDIAL MENISCECTOMY;  Surgeon: Tarry Kos, MD;  Location: [facility];  Service: Orthopedics;  Laterality: Right;   TOTAL KNEE ARTHROPLASTY Left 01/24/2019   Procedure: LEFT TOTAL KNEE ARTHROPLASTY;  Surgeon: Tarry Kos, MD;  [facility];  Service: Orthopedics;  Laterality: Left;   TOTAL KNEE ARTHROPLASTY Right 08/04/2019   Procedure: RIGHT TOTAL KNEE ARTHROPLASTY;  Surgeon: Tarry Kos, MD;  Location[facility];  Service: Orthopedics;  Laterality: Right;   Social History   Occupational History    Employer: [employer]  Tobacco Use   Smoking status: Former    Packs/day: 0.25    Years: 27.00    Total pack years: 6.75    Types: Cigarettes    Quit date: 08/20/2018    Years since quitting: 3.0   Smokeless tobacco: Never  Vaping Use   Vaping Use: Never used  Substance and Sexual Activity   Alcohol use: Yes     Comment: socially   Drug use: No   Sexual activity: Not on file

## 2021-09-05 ENCOUNTER — Ambulatory Visit (HOSPITAL_COMMUNITY)
Admission: EM | Admit: 2021-09-05 | Discharge: 2021-09-05 | Disposition: A | Discharge status: Home / Self Care | Payer: BC Managed Care – PPO | Attending: Emergency Medicine | Admitting: Emergency Medicine

## 2021-09-05 ENCOUNTER — Encounter (HOSPITAL_COMMUNITY): Payer: Self-pay | Admitting: *Deleted

## 2021-09-05 DIAGNOSIS — H1031 Unspecified acute conjunctivitis, right eye: Secondary | ICD-10-CM | POA: Diagnosis present

## 2021-09-05 DIAGNOSIS — U071 COVID-19: Secondary | ICD-10-CM | POA: Diagnosis present

## 2021-09-05 DIAGNOSIS — J069 Acute upper respiratory infection, unspecified: Secondary | ICD-10-CM

## 2021-09-05 LAB — SARS CORONAVIRUS 2 BY RT PCR: SARS Coronavirus 2 by RT PCR: POSITIVE — AB

## 2021-09-05 NOTE — ED Triage Notes (Signed)
Reports cough and runny nose x 1 wk; states positive home Covid test this AM. Unsure if fevers. Yesterday was swimming, and noticed right eye redness and some irritation. Has been using OTC eye drops. Denies eye drainage or vision changes.

## 2021-09-05 NOTE — ED Provider Notes (Signed)
[place]    CSN: [id number] Arrival date & time: 09/05/21  1025      History   Chief Complaint Chief Complaint  Patient presents with   Eye Problem    + home covid test    HPI Brett [name] is a 61 y.o. male.  Presents with 1 week history of nasal congestion and cough. Reports some body aches the first few days, minor sore throat. Productive cough that is controlled withy OTC cough medicine. Reports right eye irritation and redness that began 2 days ago.  No pain or drainage.  Has been using allergy drops with much improvement.  Denies fever, chills, shortness of breath, chest pain, abdominal pain or other GI symptoms.  No rash.  Home covid test positive this morning. No known sick contacts  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   Status post total knee replacement, right 08/04/2019   Status post total left knee replacement 01/24/2019   Overweight (BMI 25.0-29.9) 08/08/2017   Medication management 03/23/2014   Vitamin D deficiency 03/23/2014   Abnormal glucose 03/23/2014   Hyperlipemia 10/31/2006   GERD 10/31/2006    Past Surgical History:  Procedure Laterality Date   ANAL FISSURE REPAIR  02/24/11   CHONDROPLASTY  08/14/2018   Procedure: CHONDROPLASTY;  Surgeon: Leandrew Koyanagi, MD;  Location: [facility];  Service: Orthopedics;;   COLONOSCOPY     KNEE ARTHROSCOPY WITH MEDIAL MENISECTOMY Left 08/14/2018   Procedure: LEFT KNEE ARTHROSCOPY WITH PARTIAL MEDIAL MENISCECTOMY;  Surgeon: Leandrew Koyanagi, MD;  Location: [facility];  Service: Orthopedics;  Laterality: Left;   KNEE ARTHROSCOPY WITH MEDIAL MENISECTOMY Right 10/18/2018   Procedure: RIGHT  KNEE ARTHROSCOPY WITH PARTIAL MEDIAL MENISCECTOMY;  Surgeon: Leandrew Koyanagi, MD;  Location: [facility];  Service: Orthopedics;  Laterality: Right;   TOTAL KNEE ARTHROPLASTY Left 01/24/2019   Procedure: LEFT TOTAL KNEE ARTHROPLASTY;  Surgeon: Leandrew Koyanagi, MD;  Location: [facility];  Service: Orthopedics;  Laterality: Left;   TOTAL KNEE ARTHROPLASTY Right 08/04/2019   Procedure: RIGHT TOTAL KNEE ARTHROPLASTY;  Surgeon: Leandrew Koyanagi, MD;  Location: [facility];  Service: Orthopedics;  Laterality: Right;       Home Medications    Prior to Admission medications   Medication Sig Start Date End Date Taking? Authorizing Provider  Cholecalciferol (VITAMIN D-3) 125 MCG (5000 UT) TABS Take 10,000 Units by mouth daily.   Yes [provider]  gabapentin (NEURONTIN) 800 MG tablet Take  1 tablet  3 to 4 x /day  for Severe Pain 01/25/21  Yes Unk Pinto, MD  traMADol (ULTRAM) 50 MG tablet Take 1 tablet (50 mg total) by mouth 2 (two) times daily as needed. 07/26/21  Yes Aundra Dubin, PA-C  UNKNOWN TO PATIENT Rx cough med   Yes [provider]  blood glucose meter kit and supplies Dispense based insurance preference. E11.22 CHECK BLOOD SUGAR ONCE DAILY 09/25/19   Garnet Sierras, NP  Continuous Blood Gluc Receiver (FREESTYLE LIBRE 14 DAY READER) DEVI 1 Device by Does not apply route every 14 (fourteen) days. 08/26/19   Garnet Sierras, NP  glucose blood test strip Test blood sugar once daily 03/24/20   Garnet Sierras, NP  ibuprofen (ADVIL) 600 MG tablet Take 1 tablet (600 mg total) by mouth 3 (three) times daily as needed. 07/26/21   Aundra Dubin, PA-C  methocarbamol (ROBAXIN) 750 MG tablet Take  1/2 to 1 tablet  2 to 3 x /day  if needed for Muscle Spasm 02/09/21   Unk Pinto, MD  promethazine (PHENERGAN) 12.5 MG tablet Take 1 tablet (12.5 mg total) by mouth every 6 (six) hours as needed for nausea or vomiting. 08/08/19   Aundra Dubin, PA-C  sildenafil (VIAGRA) 100 MG  tablet Take 1 tablet (100 mg total) by mouth as needed for erectile dysfunction. 10/13/19 02/09/21  Garnet Sierras, NP    Family History Family History  Problem Relation Age of Onset   Rheum arthritis Mother    Heart disease Mother    Diabetes Mother    Arthritis Mother    Diabetes Father    Blindness Father        from DM?   Allergies Sister    Allergies Daughter    Asthma Daughter     Social History Social History   Tobacco Use   Smoking status: Former    Packs/day: 0.25    Years: 27.00    Total pack years: 6.75    Types: Cigarettes    Quit date: 08/20/2018    Years since quitting: 3.0   Smokeless tobacco: Never  Vaping Use   Vaping Use: Never used  Substance Use Topics   Alcohol use: Yes    Comment: socially   Drug use: No     Allergies   Benzodiazepines, Chantix  [varenicline], Cialis [tadalafil], Dicyclomine, Meloxicam, Penicillins, and Relafen [nabumetone]   Review of Systems Review of Systems Per HPI  Physical Exam Triage Vital Signs ED Triage Vitals [09/05/21 1041]  Enc Vitals Group     BP 133/89     Pulse Rate 97     Resp 18     Temp 98.3 F (36.8 C)     Temp Source Oral     SpO2 97 %     Weight      Height      Head Circumference      Peak Flow      Pain Score      Pain Loc      Pain Edu?      Excl. in [place]?    No data found.  Updated Vital Signs BP 133/89   Pulse 97   Temp 98.3 F (36.8 C) (Oral)   Resp 18   SpO2 97%   Visual Acuity Right Eye Distance: 20/20 -1 Left Eye Distance: 20/40 Bilateral Distance: 20/25 -1  Right Eye Near:   Left Eye Near:    Bilateral Near:     Physical Exam Vitals and nursing note reviewed.  Constitutional:      General: He is not in acute distress. HENT:     Nose: No congestion.     Mouth/Throat:     Mouth: Mucous membranes are moist.     Pharynx: Uvula midline. No posterior oropharyngeal erythema.     Tonsils: No tonsillar exudate or tonsillar abscesses.  Eyes:     General: Lids are  normal. Vision grossly intact.        Right eye: No discharge.     Extraocular Movements: Extraocular movements intact.     Right eye: Normal extraocular motion.  Conjunctiva/sclera:     Right eye: Right conjunctiva is injected.     Pupils: Pupils are equal, round, and reactive to light.  Cardiovascular:     Rate and Rhythm: Normal rate and regular rhythm.     Pulses: Normal pulses.     Heart sounds: Normal heart sounds.  Pulmonary:     Effort: Pulmonary effort is normal. No respiratory distress.     Breath sounds: Normal breath sounds. No wheezing.  Musculoskeletal:     Cervical back: Normal range of motion.  Lymphadenopathy:     Cervical: No cervical adenopathy.  Skin:    General: Skin is warm and dry.  Neurological:     Mental Status: He is alert and oriented to person, place, and time.      UC Treatments / Results  Labs (all labs ordered are listed, but only abnormal results are displayed) Labs Reviewed  SARS CORONAVIRUS 2 BY RT PCR    EKG  Radiology No results found.  Procedures Procedures Medications Ordered in UC Medications - No data to display  Initial Impression / Assessment and Plan / UC Course  I have reviewed the triage vital signs and the nursing notes.  Pertinent labs & imaging results that were available during my care of the patient were reviewed by me and considered in my medical decision making (see chart for details).  Viral URI, conjunctivitis Patient had home COVID test that was positive, likely has the virus.  However he would like a confirmation test here.  Swab is pending. Out of time frame for antivirals. He is well-appearing, afebrile, clear lungs.  Is doing well with home symptomatic care.  Recommend continue this.  Can follow-up with urgent care or primary care with persistent symptoms. Return precautions discussed. Patient agrees to plan  Final Clinical Impressions(s) / UC Diagnoses   Final diagnoses:  Positive self-administered  antigen test for COVID-19  Viral URI with cough  Acute conjunctivitis of right eye, unspecified acute conjunctivitis type     Discharge Instructions      Continue symptomatic care at home. You should notice improvement in your symptoms since they began 1 week ago. If they persist despite treatment, return to urgent care or follow up with your primary care provider.  Since your home COVID test was positive, it is likely that you have the COVID virus.  We will call you if the swab here returns positive as well.     ED Prescriptions   None    PDMP not reviewed this encounter.   Johndaniel Catlin, Vernice [place] 09/05/21 1119

## 2021-09-05 NOTE — Discharge Instructions (Addendum)
Continue symptomatic care at home. You should notice improvement in your symptoms since they began 1 week ago. If they persist despite treatment, return to urgent care or follow up with your primary care provider.  Since your home COVID test was positive, it is likely that you have the COVID virus.  We will call you if the swab here returns positive as well.

## 2021-10-21 ENCOUNTER — Telehealth: Payer: Self-pay | Admitting: Orthopaedic Surgery

## 2021-10-21 NOTE — Telephone Encounter (Signed)
Patient came in requesting a Handicap placard please advise

## 2021-10-24 NOTE — Telephone Encounter (Signed)
Yes permanent.  thx

## 2021-10-24 NOTE — Telephone Encounter (Signed)
Called and LMOM that handicp is up front for pick up.

## 2021-12-11 ENCOUNTER — Other Ambulatory Visit: Payer: Self-pay | Admitting: Orthopaedic Surgery

## 2021-12-11 ENCOUNTER — Other Ambulatory Visit: Payer: Self-pay | Admitting: Internal Medicine

## 2021-12-14 ENCOUNTER — Other Ambulatory Visit: Payer: Self-pay

## 2021-12-19 ENCOUNTER — Telehealth: Payer: Self-pay | Admitting: Orthopaedic Surgery

## 2021-12-19 ENCOUNTER — Encounter: Payer: Self-pay | Admitting: Orthopaedic Surgery

## 2021-12-19 NOTE — Telephone Encounter (Signed)
Patient also sent My Chart message. This has been sent to Dr. Roda Shutters to advise.

## 2021-12-19 NOTE — Telephone Encounter (Signed)
Pt called requesting a call back about hours need to be reduced at work and would like to discuss with Dr Roda Shutters. Please call pt concerning this matter at [phone].

## 2021-12-20 NOTE — Telephone Encounter (Signed)
See message.  Thanks.

## 2022-01-12 ENCOUNTER — Ambulatory Visit: Payer: BC Managed Care – PPO | Admitting: Nurse Practitioner

## 2022-01-20 ENCOUNTER — Telehealth: Payer: Self-pay | Admitting: Orthopaedic Surgery

## 2022-01-20 NOTE — Telephone Encounter (Signed)
Pt called requesting ibuprofen for inflammation in his knees. Please send to pharmacy on file. Pt phone number is [phone].

## 2022-01-22 ENCOUNTER — Other Ambulatory Visit: Payer: Self-pay | Admitting: Internal Medicine

## 2022-01-22 ENCOUNTER — Other Ambulatory Visit: Payer: Self-pay | Admitting: Nurse Practitioner

## 2022-01-22 MED ORDER — GABAPENTIN 600 MG PO TABS: ORAL_TABLET | ORAL | 0 refills | Status: DC

## 2022-01-23 ENCOUNTER — Other Ambulatory Visit: Payer: Self-pay | Admitting: Physician Assistant

## 2022-01-23 MED ORDER — IBUPROFEN 600 MG PO TABS
600.0000 mg | ORAL_TABLET | Freq: Three times a day (TID) | ORAL | 2 refills | Status: DC | PRN
Start: 2022-01-23 — End: 2023-01-01

## 2022-01-23 NOTE — Telephone Encounter (Signed)
sent

## 2022-01-24 ENCOUNTER — Encounter: Payer: Self-pay | Admitting: Nurse Practitioner

## 2022-01-24 ENCOUNTER — Ambulatory Visit: Payer: BC Managed Care – PPO | Admitting: Nurse Practitioner

## 2022-01-24 VITALS — BP 130/78 | HR 87 | Temp 98.1°F | Ht 66.0 in | Wt 166.2 lb

## 2022-01-24 DIAGNOSIS — E559 Vitamin D deficiency, unspecified: Secondary | ICD-10-CM

## 2022-01-24 DIAGNOSIS — E782 Mixed hyperlipidemia: Secondary | ICD-10-CM

## 2022-01-24 DIAGNOSIS — R7309 Other abnormal glucose: Secondary | ICD-10-CM

## 2022-01-24 DIAGNOSIS — M19042 Primary osteoarthritis, left hand: Secondary | ICD-10-CM

## 2022-01-24 DIAGNOSIS — Z720 Tobacco use: Secondary | ICD-10-CM

## 2022-01-24 DIAGNOSIS — F524 Premature ejaculation: Secondary | ICD-10-CM

## 2022-01-24 DIAGNOSIS — Z79899 Other long term (current) drug therapy: Secondary | ICD-10-CM

## 2022-01-24 DIAGNOSIS — M19041 Primary osteoarthritis, right hand: Secondary | ICD-10-CM

## 2022-01-24 DIAGNOSIS — E663 Overweight: Secondary | ICD-10-CM

## 2022-01-24 DIAGNOSIS — F17200 Nicotine dependence, unspecified, uncomplicated: Secondary | ICD-10-CM

## 2022-01-24 DIAGNOSIS — R1310 Dysphagia, unspecified: Secondary | ICD-10-CM

## 2022-01-24 DIAGNOSIS — K219 Gastro-esophageal reflux disease without esophagitis: Secondary | ICD-10-CM

## 2022-01-24 DIAGNOSIS — R0989 Other specified symptoms and signs involving the circulatory and respiratory systems: Secondary | ICD-10-CM | POA: Diagnosis not present

## 2022-01-24 DIAGNOSIS — D649 Anemia, unspecified: Secondary | ICD-10-CM

## 2022-01-24 DIAGNOSIS — F419 Anxiety disorder, unspecified: Secondary | ICD-10-CM

## 2022-01-24 MED ORDER — SILDENAFIL CITRATE 100 MG PO TABS: 100.0000 mg | ORAL_TABLET | ORAL | 1 refills | Status: DC | PRN

## 2022-01-24 MED ORDER — GABAPENTIN 600 MG PO TABS
ORAL_TABLET | ORAL | 0 refills | Status: DC
Start: 2022-01-24 — End: 2022-09-05

## 2022-01-24 MED ORDER — OMEPRAZOLE 40 MG PO CPDR: 40.0000 mg | DELAYED_RELEASE_CAPSULE | Freq: Every day | ORAL | 0 refills | Status: DC

## 2022-01-24 NOTE — Progress Notes (Signed)
FOLLOW UP 3 MONTH  Assessment and Plan:  Hyperlipidemia, unspecified hyperlipidemia type Discussed lifestyle modifications. Recommended diet heavy in fruits and veggies, omega 3's. Decrease consumption of animal meats, cheeses, and dairy products. Remain active and exercise as tolerated. Continue to monitor. Check lipids/TSH   Abnormal glucose/Prediabetes Education: Reviewed 'ABCs' of diabetes management  Discussed goals to be met and/or maintained include A1C (<7) Blood pressure (<130/80) Cholesterol (LDL <70) Continue Eye Exam yearly  Continue Dental Exam Q6 mo Discussed dietary recommendations Discussed Physical Activity recommendations Check A1C   Overweight (BMI 25.0-29.9) Discussed appropriate BMI Goal of losing 1 lb per month. Diet modification. Physical activity. Encouraged/praised to build confidence.   Vitamin D deficiency Continue supplement  Medication management All medications discussed and reviewed in full. All questions and concerns regarding medications addressed.     Gastroesophageal reflux disease, esophagitis presence not specified No suspected reflux complications (Barret/stricture). Lifestyle modification:  wt loss, avoid meals 2-3h before bedtime. Consider eliminating food triggers:  chocolate, caffeine, EtOH, acid/spicy food.   Hypertension, unspecified type Discussed DASH (Dietary Approaches to Stop Hypertension) DASH diet is lower in sodium than a typical American diet. Cut back on foods that are high in saturated fat, cholesterol, and trans fats. Eat more whole-grain foods, fish, poultry, and nuts Remain active and exercise as tolerated daily.  Monitor BP at home-Call if greater than 130/80.  Check CMP/CBC   Anemia, unspecified type Monitor CBC Check anemia panel if symptomatic, CPE  Anxiety Reviewed relaxation techniques.  Sleep hygiene. Recommended Cognitive Behavioral Therapy (CBT). Recommended mindfulness meditation and  exercise.   Encouraged personality growth wand development through coping techniques and problem-solving skills. Limit/Decrease/Monitor drug/alcohol intake.    Osteoarthritis of both hands and knees Continue to follow with Orthopedics 02/2022 Currently on Neurontin and Ibuprofen Use voltaren gel up to 4 times a day as needed  Dysphagia Instructed to completed MBS. Discussed tuck chin maneuver when swallowing. Due to see GI this year for colonoscopy Continue to monitor  Smoker/tobacco use (occasional) Smoking cessation instruction/counseling given:  counseled patient on the dangers of tobacco use, advised patient to stop smoking, and reviewed strategies to maximize success  Orders Placed This Encounter  Procedures   CBC with Differential/Platelet   COMPLETE METABOLIC PANEL WITH GFR   Lipid panel   Hemoglobin A1c   VITAMIN D 25 Hydroxy (Vit-D Deficiency, Fractures)   SLP modified barium swallow    Standing Status:   Future    Standing Expiration Date:   01/25/2023    Order Specific Question:   Where should this test be performed:    Answer:   Other    Comments:   [address] Question:   Please indicate reason for Referral:    Answer:   Concerned about Dysphagia/Aspiration    Order Specific Question:   Patients current diet consistency:    Answer:   Regular    Order Specific Question:   Existing signs/symptoms of possible Aspiration/Dysphagia:    Answer:   Cough with Meals/Meds/Other P.O.s    Order Specific Question:   Existing signs/symptoms of possible Aspiration/Dysphagia:    Answer:   Coughing up mucus/phlegm   Meds ordered this encounter  Medications   gabapentin (NEURONTIN) 600 MG tablet    Sig: Take one tablet 4 x daily as needed for pain    Dispense:  120 tablet    Refill:  0   sildenafil (VIAGRA) 100 MG tablet    Sig: Take 1 tablet (100  mg total) by mouth as needed for erectile dysfunction.    Dispense:  20 tablet    Refill:  1    omeprazole (PRILOSEC) 40 MG capsule    Sig: Take 1 capsule (40 mg total) by mouth daily.    Dispense:  90 capsule    Refill:  0    Order Specific Question:   Supervising Provider    Answer:   Lucky Cowboy [phone]    Notify office for further evaluation and treatment, questions or concerns if any reported s/s fail to improve.   The patient was advised to call back or seek an in-person evaluation if any symptoms worsen or if the condition fails to improve as anticipated.   Further disposition pending results of labs. Discussed med's effects and SE's.    I discussed the assessment and treatment plan with the patient. The patient was provided an opportunity to ask questions and all were answered. The patient agreed with the plan and demonstrated an understanding of the instructions.  Discussed med's effects and SE's. Screening labs and tests as requested with regular follow-up as recommended.  I provided 25 minutes of face-to-face time during this encounter including counseling, chart review, and critical decision making was preformed. ________________________________________________________________________  HPI 62 year old Brett [name] present for 3 month follow up for HTN, HLD, GERD, abnormal glucose, vitamin D deficiency and weight.  Overall he reports feeling well.   He has some concern with ongoing dysphagia.  States that he has seen GI in the past and discussed "throat stretching." He is continuing to get choked on thin liquids, foods, pills.  He is due for a colonoscopy.   S/p left knee arthroscopy (01/24/19) and medal menisectomy with Dr. Roda Shutters on 08/14/2018. He continues to have pain in his right and left knees.  L>R.  Has f/u apt in 02/2022 but may request a second opinion as he feels as though surgery is only option moving forward.  Has Tramadol PRN. Wears bilateral knee braces when working.   He is an occasional smoker.- started back up due to cOVID, goes off and on, started when he was  16, stopped in 30's and then off and on. Last CXR 2019. Quit smoking almost a year ago and will occasionally smoke a cigarettes or 2.   Lab Results  Component Value Date   VITAMINB12 453 07/12/2021   BMI is There is no height or weight on file to calculate BMI., he is working on diet and exercise. Wt Readings from Last 3 Encounters:  07/12/21 166 lb 9.6 oz (75.6 kg)  02/09/21 169 lb (76.7 kg)  01/25/21 170 lb (77.1 kg)     He is not on cholesterol medication and denies myalgias. His cholesterol is at goal. The cholesterol last visit was:   Lab Results  Component Value Date   CHOL 182 07/12/2021   HDL 59 07/12/2021   LDLCALC 102 (H) 07/12/2021   TRIG 109 07/12/2021   CHOLHDL 3.1 07/12/2021    He has been working on diet and exercise for prediabetes, sugars have been checking it, he is not on bASA, he is not on ACE/ARB and denies foot ulcerations, nausea, paresthesia of the feet, polydipsia, polyuria, visual disturbances, vomiting and weight loss. Blood sugars are running 88-100 fasting and after eating running 110 Last A1C in the office was:  Lab Results  Component Value Date   HGBA1C 5.8 (H) 07/12/2021    Patient is on Vitamin D supplement, does 5000 IU a day.  Lab Results  Component Value Date   VD25OH 37 07/12/2021    Last PSA was: Lab Results  Component Value Date   PSA 0.59 07/12/2021   Denies BPH symptoms daytime frequency, double voiding, dysuria, hematuria, hesitancy, incontinence, intermittency, nocturia, sensation of incomplete bladder emptying, suprapubic pain, urgency or weak urinary stream.     Current Medications:    Current Outpatient Medications (Cardiovascular):    sildenafil (VIAGRA) 100 MG tablet, Take 1 tablet (100 mg total) by mouth as needed for erectile dysfunction.  Current Outpatient Medications (Respiratory):    promethazine (PHENERGAN) 12.5 MG tablet, Take 1 tablet (12.5 mg total) by mouth every 6 (six) hours as needed for nausea or  vomiting.  Current Outpatient Medications (Analgesics):    ibuprofen (ADVIL) 600 MG tablet, Take 1 tablet (600 mg total) by mouth 3 (three) times daily as needed.   traMADol (ULTRAM) 50 MG tablet, Take 1 tablet (50 mg total) by mouth 2 (two) times daily as needed.   Current Outpatient Medications (Other):    blood glucose meter kit and supplies, Dispense based insurance preference. E11.22 CHECK BLOOD SUGAR ONCE DAILY   Cholecalciferol (VITAMIN D-3) 125 MCG (5000 UT) TABS, Take 10,000 Units by mouth daily.   Continuous Blood Gluc Receiver (FREESTYLE LIBRE 14 DAY READER) DEVI, 1 Device by Does not apply route every 14 (fourteen) days.   gabapentin (NEURONTIN) 600 MG tablet, Take 1 tablet 4 x /day as needed for Severe Pain                                       /           TAKE                                          BY                                MOUTH   glucose blood test strip, Test blood sugar once daily   methocarbamol (ROBAXIN) 750 MG tablet, Take  1/2 to 1 tablet  2 to 3 x /day  if needed for Muscle Spasm   UNKNOWN TO PATIENT, Rx cough med  Health Maintenance:  Immunization History  Administered Date(s) Administered   Influenza Inj Mdck Quad With Preservative 10/18/2017   Influenza Split 10/13/2019   Influenza Whole 10/31/2006, 11/03/2010, 11/02/2011   Influenza,inj,Quad PF,6+ Mos 09/12/2018, 10/25/2020   Influenza-Unspecified 01/03/2012   PFIZER(Purple Top)SARS-COV-2 Vaccination 03/25/2019, 04/15/2019, 11/25/2019, 03/09/2020   PPD Test 03/17/2013, 07/13/2020, 07/12/2021   Pneumococcal-Unspecified 10/13/2009   Td 01/03/2008   Tdap 01/03/2011   Health Maintenance  Topic Date Due   Zoster Vaccines- Shingrix (1 of 2) Never done   DTaP/Tdap/Td (3 - Td or Tdap) 01/02/2021   COLONOSCOPY (Pts 45-1yrs Insurance coverage will need to be confirmed)  01/12/2021   INFLUENZA VACCINE  08/02/2021   COVID-19 Vaccine (5 - 2023-24 season) 09/02/2021   Hepatitis C Screening  Completed    HIV Screening  Completed   HPV VACCINES  Aged Out    Colonoscopy 10/2011 CT head 2008  Patient Care Team: Unk Pinto, MD as PCP - General (Internal Medicine) Deneise Lever, MD as Consulting Physician (Pulmonary Disease) Wilford Corner, MD as  Consulting Physician (Gastroenterology) Lucky Cowboy, MD as Referring Physician (Internal Medicine)  Allergies:  Allergies  Allergen Reactions   Benzodiazepines Hives   Chantix  [Varenicline] Hives   Cialis [Tadalafil]     Hives   Dicyclomine Hives   Meloxicam     Rash   Penicillins Hives    TOLERATED ANCEF 08/04/2019.  DID THE REACTION INVOLVE: Swelling of the face/tongue/throat, SOB, or low BP? Unknown Sudden or severe rash/hives, skin peeling, or the inside of the mouth or nose? Unknown Did it require medical treatment? n  When did it last happen? Over 5 years ago.  If all above answers are "NO", may proceed with cephalosporin use.    Relafen [Nabumetone]     Hives    Medical History:  Past Medical History:  Diagnosis Date   Allergy    albuterol as needed   Anal fissure    Arthritis    Colon polyps 01/13/11   Colonoscopy   GERD (gastroesophageal reflux disease)    Pneumonia    Pre-diabetes     Surgical History:  Past Surgical History:  Procedure Laterality Date   ANAL FISSURE REPAIR  02/24/11   CHONDROPLASTY  08/14/2018   Procedure: CHONDROPLASTY;  Surgeon: Tarry Kos, MD;  Location: [facility];  Service: Orthopedics;;   COLONOSCOPY     KNEE ARTHROSCOPY WITH MEDIAL MENISECTOMY Left 08/14/2018   Procedure: LEFT KNEE ARTHROSCOPY WITH PARTIAL MEDIAL MENISCECTOMY;  Surgeon: Tarry Kos, MD;  Location: [facility];  Service: Orthopedics;  Laterality: Left;   KNEE ARTHROSCOPY WITH MEDIAL MENISECTOMY Right 10/18/2018   Procedure: RIGHT KNEE ARTHROSCOPY WITH PARTIAL MEDIAL MENISCECTOMY;  Surgeon: Tarry Kos, MD;  Location: [facility];  Service: Orthopedics;   Laterality: Right;   TOTAL KNEE ARTHROPLASTY Left 01/24/2019   Procedure: LEFT TOTAL KNEE ARTHROPLASTY;  Surgeon: Tarry Kos, MD;  Location: [facility];  Service: Orthopedics;  Laterality: Left;   TOTAL KNEE ARTHROPLASTY Right 08/04/2019   Procedure: RIGHT TOTAL KNEE ARTHROPLASTY;  Surgeon: Tarry Kos, MD;  Location: [facility];  Service: Orthopedics;  Laterality: Right;    Family History:  Family History  Problem Relation Age of Onset   Rheum arthritis Mother    Heart disease Mother    Diabetes Mother    Arthritis Mother    Diabetes Father    Blindness Father        from DM?   Allergies Sister    Allergies Daughter    Asthma Daughter     Social History:   He  reports that he quit smoking about 3 years ago. His smoking use included cigarettes. He has a 6.75 pack-year smoking history. He has never used smokeless tobacco. He reports current alcohol use. He reports that he does not use drugs.  Review of Systems:  Review of Systems  Constitutional:  Negative for chills, fever and weight loss.  HENT:  Negative for congestion, ear pain, hearing loss, sinus pain and sore throat.   Eyes:  Negative for double vision, photophobia and pain.  Respiratory:  Negative for cough and shortness of breath.   Cardiovascular:  Negative for chest pain, palpitations, orthopnea, claudication, leg swelling and PND.  Gastrointestinal:  Negative for abdominal pain, blood in stool, constipation, diarrhea, melena, nausea and vomiting.       Dysphagia mostly with solids  Genitourinary:  Negative for dysuria, flank pain, frequency, hematuria and urgency.  Musculoskeletal:  Positive for joint pain. Negative for falls, myalgias and  neck pain.  Skin:  Negative for rash.  Neurological:  Negative for dizziness, tingling, sensory change, speech change, focal weakness and headaches.  Psychiatric/Behavioral:  Negative for depression, substance abuse and suicidal ideas. The patient is nervous/anxious. The patient does not  have insomnia.     Physical Exam: Estimated body mass index is 26.89 kg/m as calculated from the following:   Height as of 07/12/21: 5\' 6"  (1.676 m).   Weight as of 07/12/21: 166 lb 9.6 oz (75.6 kg). There were no vitals taken for this visit.  General Appearance: Well nourished, in no apparent distress.  Eyes: PERRLA, EOMs, conjunctiva no swelling or erythema ENT/Mouth: Ear canals clear bilaterally with no erythema, swelling, discharge.  TMs normal bilaterally with no erythema, bulging, or retractions.  Oropharynx clear and moist with no exudate, swelling, or erythema.  Dentition normal.   Neck: Right cervical adenopathy near mastoid/year.  Supple, thyroid normal. No bruits, JVD, cervical adenopathy Respiratory: Respiratory effort normal, BS equal bilaterally without rales, rhonchi, wheezing or stridor.  Cardio: RRR without murmurs, rubs or gallops. Brisk peripheral pulses without edema.  Chest: symmetric, with normal excursions Abdomen: Soft, nontender, no guarding, rebound, hernias, masses, or organomegaly. Musculoskeletal: Full ROM all peripheral extremities,5/5 strength, and antalgic gait. Skin: Warm, dry without rashes, lesions, ecchymosis. Neuro: A&Ox3, Cranial nerves intact, reflexes equal bilaterally. Normal muscle tone, no cerebellar symptoms. Sensation intact.  Psych: Normal affect, Insight and Judgment appropriate.    09/12/21, NP [facility]

## 2022-01-24 NOTE — Patient Instructions (Signed)

## 2022-01-25 LAB — COMPLETE METABOLIC PANEL WITH GFR
AG Ratio: 1.5 (calc) (ref 1.0–2.5)
ALT: 15 U/L (ref 9–46)
AST: 16 U/L (ref 10–35)
Albumin: 4 g/dL (ref 3.6–5.1)
Alkaline phosphatase (APISO): 97 U/L (ref 35–144)
BUN: 12 mg/dL (ref 7–25)
CO2: 28 mmol/L (ref 20–32)
Calcium: 9 mg/dL (ref 8.6–10.3)
Chloride: 107 mmol/L (ref 98–110)
Creat: 0.9 mg/dL (ref 0.70–1.35)
Globulin: 2.6 g/dL (calc) (ref 1.9–3.7)
Glucose, Bld: 121 mg/dL — ABNORMAL HIGH (ref 65–99)
Potassium: 4 mmol/L (ref 3.5–5.3)
Sodium: 141 mmol/L (ref 135–146)
Total Bilirubin: 0.2 mg/dL (ref 0.2–1.2)
Total Protein: 6.6 g/dL (ref 6.1–8.1)
eGFR: 97 mL/min/{1.73_m2} (ref >=60)

## 2022-01-25 LAB — HEMOGLOBIN A1C
Hgb A1c MFr Bld: 6.1 % of total Hgb — ABNORMAL HIGH (ref <5.7)
Mean Plasma Glucose: 128 mg/dL
eAG (mmol/L): 7.1 mmol/L

## 2022-01-25 LAB — LIPID PANEL
Cholesterol: 149 mg/dL (ref <200)
HDL: 66 mg/dL (ref >=40)
LDL Cholesterol (Calc): 70 mg/dL (calc)
Non-HDL Cholesterol (Calc): 83 mg/dL (calc) (ref <130)
Total CHOL/HDL Ratio: 2.3 (calc) (ref <5.0)
Triglycerides: 47 mg/dL (ref <150)

## 2022-01-25 LAB — CBC WITH DIFFERENTIAL/PLATELET
Absolute Monocytes: 552 cells/uL (ref 200–950)
Basophils Absolute: 62 cells/uL (ref 0–200)
Basophils Relative: 1 %
Eosinophils Absolute: 279 cells/uL (ref 15–500)
Eosinophils Relative: 4.5 %
HCT: 38.7 % (ref 38.5–50.0)
Hemoglobin: 12.6 g/dL — ABNORMAL LOW (ref 13.2–17.1)
Lymphs Abs: 3088 cells/uL (ref 850–3900)
MCH: 27 pg (ref 27.0–33.0)
MCHC: 32.6 g/dL (ref 32.0–36.0)
MCV: 82.9 fL (ref 80.0–100.0)
MPV: 10.9 fL (ref 7.5–12.5)
Monocytes Relative: 8.9 %
Neutro Abs: 2220 cells/uL (ref 1500–7800)
Neutrophils Relative %: 35.8 %
Platelets: 226 10*3/uL (ref 140–400)
RBC: 4.67 10*6/uL (ref 4.20–5.80)
RDW: 12.3 % (ref 11.0–15.0)
Total Lymphocyte: 49.8 %
WBC: 6.2 10*3/uL (ref 3.8–10.8)

## 2022-01-25 LAB — VITAMIN D 25 HYDROXY (VIT D DEFICIENCY, FRACTURES): Vit D, 25-Hydroxy: 26 ng/mL — ABNORMAL LOW (ref 30–100)

## 2022-01-27 ENCOUNTER — Encounter: Payer: Self-pay | Admitting: Nurse Practitioner

## 2022-01-27 DIAGNOSIS — F524 Premature ejaculation: Secondary | ICD-10-CM

## 2022-01-31 MED ORDER — SILDENAFIL CITRATE 100 MG PO TABS: 100.0000 mg | ORAL_TABLET | ORAL | 1 refills | Status: DC | PRN

## 2022-02-03 MED ORDER — SILDENAFIL CITRATE 100 MG PO TABS: 100.0000 mg | ORAL_TABLET | ORAL | 1 refills | Status: DC | PRN

## 2022-04-06 ENCOUNTER — Telehealth: Payer: Self-pay | Admitting: Orthopaedic Surgery

## 2022-04-06 NOTE — Telephone Encounter (Signed)
[employer] forms received. Patient has appt 04/11/22. To [facility].

## 2022-04-11 ENCOUNTER — Ambulatory Visit (INDEPENDENT_AMBULATORY_CARE_PROVIDER_SITE_OTHER): Payer: BC Managed Care – PPO | Admitting: Orthopaedic Surgery

## 2022-04-11 ENCOUNTER — Encounter: Payer: Self-pay | Admitting: Orthopaedic Surgery

## 2022-04-11 DIAGNOSIS — M25562 Pain in left knee: Secondary | ICD-10-CM

## 2022-04-11 DIAGNOSIS — G8929 Other chronic pain: Secondary | ICD-10-CM | POA: Diagnosis not present

## 2022-04-11 DIAGNOSIS — M25561 Pain in right knee: Secondary | ICD-10-CM

## 2022-04-11 DIAGNOSIS — Z96651 Presence of right artificial knee joint: Secondary | ICD-10-CM | POA: Diagnosis not present

## 2022-04-11 DIAGNOSIS — Z96652 Presence of left artificial knee joint: Secondary | ICD-10-CM | POA: Diagnosis not present

## 2022-04-11 NOTE — Progress Notes (Signed)
Office Visit Note   Patient: Brett [name]           Date of Birth: 1960/10/08           MRN: [id number] Visit Date: 04/11/2022              Requested by: Lucky Cowboy, MD [address] [place],  [place] [address] PCP: Lucky Cowboy, MD   Assessment & Plan: Visit Diagnoses:  1. Chronic pain of both knees   2. Status post total left knee replacement   3. Status post total right knee replacement     Plan: Patient continues to have trouble with standing and pivoting at work therefore if he has requested a sedentary job with lifting restrictions of 10 pounds.  We will provide him with new hinged knee braces.  If his work requires more specific restrictions then have recommended that the patient undergo an FCE which he understands is an out-of-pocket expense to him.  We also had a discussion on upsizing the poly.  All questions answered to his satisfaction.  Total face to face encounter time was greater than 25 minutes and over half of this time was spent in counseling and/or coordination of care.  Follow-Up Instructions: Return if symptoms worsen or fail to improve.   Orders:  No orders of the defined types were placed in this encounter.  No orders of the defined types were placed in this encounter.     Procedures: No procedures performed   Clinical Data: No additional findings.   Subjective: Chief Complaint  Patient presents with   Right Knee - Follow-up    History of right TKA 08/04/2019    HPI  Tor returns today for follow-up of bilateral knee symptoms.  He is requesting extension on work restrictions.  Review of Systems   Objective: Vital Signs: There were no vitals taken for this visit.  Physical Exam  Ortho Exam  Examination of bilateral knees is unchanged.  Specialty Comments:  No specialty comments available.  Imaging: No results found.   PMFS History: Patient Active Problem List   Diagnosis Date Noted   Chronic  pain of both knees 04/11/2022   Status post total right knee replacement 10/19/2020   Status post total knee replacement, right 08/04/2019   Status post total left knee replacement 01/24/2019   Overweight (BMI 25.0-29.9) 08/08/2017   Medication management 03/23/2014   Vitamin D deficiency 03/23/2014   Abnormal glucose 03/23/2014   Hyperlipemia 10/31/2006   GERD 10/31/2006   Past Medical History:  Diagnosis Date   Allergy    albuterol as needed   Anal fissure    Arthritis    Colon polyps 01/13/11   Colonoscopy   GERD (gastroesophageal reflux disease)    Pneumonia    Pre-diabetes     Family History  Problem Relation Age of Onset   Rheum arthritis Mother    Heart disease Mother    Diabetes Mother    Arthritis Mother    Diabetes Father    Blindness Father        from DM?   Allergies Sister    Allergies Daughter    Asthma Daughter     Past Surgical History:  Procedure Laterality Date   ANAL FISSURE REPAIR  02/24/11   CHONDROPLASTY  08/14/2018   Procedure: CHONDROPLASTY;  Surgeon: Tarry Kos, MD;  Location: [facility];  Service: Orthopedics;;   COLONOSCOPY     KNEE ARTHROSCOPY WITH MEDIAL MENISECTOMY Left 08/14/2018  Procedure: LEFT KNEE ARTHROSCOPY WITH PARTIAL MEDIAL MENISCECTOMY;  Surgeon: Tarry Kos, MD;  Location: [facility];  Service: Orthopedics;  Laterality: Left;   KNEE ARTHROSCOPY WITH MEDIAL MENISECTOMY Right 10/18/2018   Procedure: RIGHT KNEE ARTHROSCOPY WITH PARTIAL MEDIAL MENISCECTOMY;  Surgeon: Tarry Kos, MD;  Location: [facility];  Service: Orthopedics;  Laterality: Right;   TOTAL KNEE ARTHROPLASTY Left 01/24/2019   Procedure: LEFT TOTAL KNEE ARTHROPLASTY;  Surgeon: Tarry Kos, MD;  Location: [facility];  Service: Orthopedics;  Laterality: Left;   TOTAL KNEE ARTHROPLASTY Right 08/04/2019   Procedure: RIGHT TOTAL KNEE ARTHROPLASTY;  Surgeon: Tarry Kos, MD;  Location: [facility];  Service: Orthopedics;  Laterality:  Right;   Social History   Occupational History    Employer: [employer]  Tobacco Use   Smoking status: Former    Packs/day: 0.25    Years: 27.00    Additional pack years: 0.00    Total pack years: 6.75    Types: Cigarettes    Quit date: 08/20/2018    Years since quitting: 3.6   Smokeless tobacco: Never  Vaping Use   Vaping Use: Never used  Substance and Sexual Activity   Alcohol use: Yes    Comment: socially   Drug use: No   Sexual activity: Not on file

## 2022-04-13 ENCOUNTER — Ambulatory Visit (INDEPENDENT_AMBULATORY_CARE_PROVIDER_SITE_OTHER): Payer: BC Managed Care – PPO | Admitting: Nurse Practitioner

## 2022-04-13 ENCOUNTER — Encounter: Payer: Self-pay | Admitting: Nurse Practitioner

## 2022-04-13 VITALS — BP 130/88 | HR 73 | Temp 97.5°F | Ht 66.0 in | Wt 169.6 lb

## 2022-04-13 DIAGNOSIS — H9201 Otalgia, right ear: Secondary | ICD-10-CM

## 2022-04-13 DIAGNOSIS — R0989 Other specified symptoms and signs involving the circulatory and respiratory systems: Secondary | ICD-10-CM | POA: Diagnosis not present

## 2022-04-23 NOTE — Patient Instructions (Signed)
  Earache, Adult An earache, or ear pain, can be caused by many things, including: An infection. Ear wax buildup. Ear pressure. Something in the ear that should not be there (foreign body). A sore throat. Tooth problems. Jaw problems. Treatment of the earache will depend on the cause. If the cause is not clear or cannot be known, you may need to watch your symptoms until your earache goes away or until a cause is found. Follow these instructions at home: Medicines Take or apply over-the-counter and prescription medicines only as told by your health care provider. If you were prescribed antibiotics, use them as told by your health care provider. Do not stop using the antibiotic even if you start to feel better. Do not put anything in your ear other than medicine that is prescribed by your health care provider. Managing pain     If directed, apply heat to the affected area as often as told by your health care provider. Use the heat source that your health care provider recommends, such as a moist heat pack or a heating pad. Place a towel between your skin and the heat source. Leave the heat on for 20-30 minutes. If your skin turns bright red, remove the heat right away to prevent burns. The risk of burns is higher if you cannot feel pain, heat, or cold. If directed, put ice on the affected area. To do this: Put ice in a plastic bag. Place a towel between your skin and the bag. Leave the ice on for 20 minutes, 2-3 times a day. If your skin turns bright red, remove the ice right away to prevent skin damage. The risk of skin damage is higher if you cannot feel pain, heat, or cold.  General instructions Pay attention to any changes in your symptoms. Try resting in an upright position instead of lying down. This may help to reduce pressure in your ear and relieve pain. Chew gum if it helps to relieve your ear pain. Treat any allergies as told by your health care provider. Drink enough  fluid to keep your urine pale yellow. It is up to you to get the results of any tests that were done. Ask your health care provider, or the department that is doing the tests, when your results will be ready. Contact a health care provider if: Your pain does not improve within 2 days. Your earache gets worse. You have new symptoms. You have a fever. Get help right away if: You have a severe headache. You have a stiff neck. You have trouble swallowing. You have redness or swelling behind your ear. You have fluid or blood coming from your ear. You have hearing loss. You feel dizzy. This information is not intended to replace advice given to you by your health care provider. Make sure you discuss any questions you have with your health care provider. Document Revised: 05/02/2021 Document Reviewed: 05/02/2021 Elsevier Patient Education  2023 [employer]  

## 2022-04-23 NOTE — Progress Notes (Signed)
Assessment and Plan:  Brett [name] was seen today for an episodic visit.  Diagnoses and all order for this visit:  Otalgia of right ear Unable to tolerate ear plugs Switch from ear plugs to ear muffs. Continue to monitor  Labile hypertension Controlled. Discussed DASH (Dietary Approaches to Stop Hypertension) DASH diet is lower in sodium than a typical American diet. Cut back on foods that are high in saturated fat, cholesterol, and trans fats. Eat more whole-grain foods, fish, poultry, and nuts Remain active and exercise as tolerated daily.  Monitor BP at home-Call if greater than 130/80.  Check CMP/CBC   Notify office for further evaluation and treatment, questions or concerns if s/s fail to improve. The risks and benefits of my recommendations, as well as other treatment options were discussed with the patient today. Questions were answered.  Further disposition pending results of labs. Discussed med's effects and SE's.    Over 15 minutes of exam, counseling, chart review, and critical decision making was performed.   Future Appointments  Date Time Provider Department Center  07/18/2022  3:00 PM Lucky Cowboy, MD [facility] None    ------------------------------------------------------------------------------------------------------------------   HPI BP 130/88   Pulse 73   Temp (!) 97.5 F (36.4 C)   Ht  (1.676 m)   Wt 169 lb 9.6 oz (76.9 kg)   SpO2 99%   BMI 27.37 kg/m   61 y.o.male presents for evaluation of right ear pain.  Feels as though it started when having to wear ear plugs versus ear muffs at work.  He is unable to tolerate the ear plug in his right ear.  The ear plug causes pain that is concentrated in the ear canal.  Tender to touch.  Unable to use the ear plugs daily d/t swelling and pain.  Denies any recent injury or trauma to the area.  Denies hearing loss or rupture, drainage.    Past Medical History:  Diagnosis Date   Allergy     albuterol as needed   Anal fissure    Arthritis    Colon polyps 01/13/11   Colonoscopy   GERD (gastroesophageal reflux disease)    Pneumonia    Pre-diabetes      Allergies  Allergen Reactions   Benzodiazepines Hives   Chantix  [Varenicline] Hives   Cialis [Tadalafil]     Hives   Dicyclomine Hives   Meloxicam     Rash   Penicillins Hives    TOLERATED ANCEF 08/04/2019.  DID THE REACTION INVOLVE: Swelling of the face/tongue/throat, SOB, or low BP? Unknown Sudden or severe rash/hives, skin peeling, or the inside of the mouth or nose? Unknown Did it require medical treatment? n  When did it last happen? Over 5 years ago.  If all above answers are "NO", may proceed with cephalosporin use.    Relafen [Nabumetone]     Hives    Current Outpatient Medications on File Prior to Visit  Medication Sig   blood glucose meter kit and supplies Dispense based insurance preference. E11.22 CHECK BLOOD SUGAR ONCE DAILY   Cholecalciferol (VITAMIN D-3) 125 MCG (5000 UT) TABS Take 10,000 Units by mouth daily.   Continuous Blood Gluc Receiver (FREESTYLE LIBRE 14 DAY READER) DEVI 1 Device by Does not apply route every 14 (fourteen) days.   gabapentin (NEURONTIN) 600 MG tablet Take one tablet 4 x daily as needed for pain   glucose blood test strip Test blood sugar once daily   ibuprofen (ADVIL) 600 MG tablet Take 1  tablet (600 mg total) by mouth 3 (three) times daily as needed.   methocarbamol (ROBAXIN) 750 MG tablet Take  1/2 to 1 tablet  2 to 3 x /day  if needed for Muscle Spasm   omeprazole (PRILOSEC) 40 MG capsule Take 1 capsule (40 mg total) by mouth daily.   promethazine (PHENERGAN) 12.5 MG tablet Take 1 tablet (12.5 mg total) by mouth every 6 (six) hours as needed for nausea or vomiting.   sildenafil (VIAGRA) 100 MG tablet Take 1 tablet (100 mg total) by mouth as needed for erectile dysfunction.   traMADol (ULTRAM) 50 MG tablet Take 1 tablet (50 mg total) by mouth 2 (two) times daily as needed.    UNKNOWN TO PATIENT Rx cough med   No current facility-administered medications on file prior to visit.    ROS: all negative except what is noted in the HPI.   Physical Exam:  BP 130/88   Pulse 73   Temp (!) 97.5 F (36.4 C)   Ht  (1.676 m)   Wt 169 lb 9.6 oz (76.9 kg)   SpO2 99%   BMI 27.37 kg/m   General Appearance: NAD.  Awake, conversant and cooperative. Eyes: PERRLA, EOMs intact.  Sclera white.  Conjunctiva without erythema. Sinuses: No frontal/maxillary tenderness.  No nasal discharge. Nares patent.  ENT/Mouth: Ext aud canals clear.  Bilateral TMs w/DOL and without erythema or bulging. Hearing intact.  Posterior pharynx without swelling or exudate.  Tonsils without swelling or erythema.  Neck: Supple.  No masses, nodules or thyromegaly. Respiratory: Effort is regular with non-labored breathing. Breath sounds are equal bilaterally without rales, rhonchi, wheezing or stridor.  Cardio: RRR with no MRGs. Brisk peripheral pulses without edema.  Abdomen: Active BS in all four quadrants.  Soft and non-tender without guarding, rebound tenderness, hernias or masses. Lymphatics: Non tender without lymphadenopathy.  Musculoskeletal: Full ROM, 5/5 strength, normal ambulation.  No clubbing or cyanosis. Skin: Appropriate color for ethnicity. Warm without rashes, lesions, ecchymosis, ulcers.  Neuro: CN II-XII grossly normal. Normal muscle tone without cerebellar symptoms and intact sensation.   Psych: AO X 3,  appropriate mood and affect, insight and judgment.     Adela Glimpse, NP 10:09 PM [facility]

## 2022-04-25 ENCOUNTER — Ambulatory Visit (INDEPENDENT_AMBULATORY_CARE_PROVIDER_SITE_OTHER): Payer: BC Managed Care – PPO | Admitting: Orthopaedic Surgery

## 2022-04-25 DIAGNOSIS — M25562 Pain in left knee: Secondary | ICD-10-CM

## 2022-04-25 DIAGNOSIS — G8929 Other chronic pain: Secondary | ICD-10-CM

## 2022-04-25 DIAGNOSIS — M25561 Pain in right knee: Secondary | ICD-10-CM

## 2022-04-25 NOTE — Progress Notes (Signed)
Patient ID: Brett [name], male   DOB: 10-Sep-1960, 62 y.o.   MRN: [id number]  Kaelum returns today to have his work restrictions lifted.  Apparently he is unable to continue at his job with the restrictions we placed therefore he just needs work note without any restrictions.  He will return back to work on Thursday without restrictions.  He may start looking for work elsewhere that is more tolerable physically for him.

## 2022-05-03 ENCOUNTER — Emergency Department (HOSPITAL_COMMUNITY): Payer: BC Managed Care – PPO

## 2022-05-03 ENCOUNTER — Other Ambulatory Visit: Payer: Self-pay

## 2022-05-03 ENCOUNTER — Encounter (HOSPITAL_COMMUNITY): Payer: Self-pay

## 2022-05-03 ENCOUNTER — Emergency Department (HOSPITAL_COMMUNITY)
Admission: EM | Admit: 2022-05-03 | Discharge: 2022-05-03 | Disposition: A | Discharge status: Home / Self Care | Payer: BC Managed Care – PPO | Attending: Emergency Medicine | Admitting: Emergency Medicine

## 2022-05-03 DIAGNOSIS — M542 Cervicalgia: Secondary | ICD-10-CM | POA: Diagnosis present

## 2022-05-03 DIAGNOSIS — Y92410 Unspecified street and highway as the place of occurrence of the external cause: Secondary | ICD-10-CM | POA: Diagnosis not present

## 2022-05-03 DIAGNOSIS — M62830 Muscle spasm of back: Secondary | ICD-10-CM | POA: Insufficient documentation

## 2022-05-03 MED ORDER — LIDOCAINE 5 % EX PTCH
1.0000 | MEDICATED_PATCH | CUTANEOUS | 0 refills | Status: DC
Start: 2022-05-03 — End: 2022-07-18

## 2022-05-03 MED ORDER — ETODOLAC 400 MG PO TABS: 400.0000 mg | ORAL_TABLET | Freq: Two times a day (BID) | ORAL | 0 refills | Status: AC

## 2022-05-03 MED ORDER — HYDROCODONE-ACETAMINOPHEN 5-325 MG PO TABS
1.0000 | ORAL_TABLET | Freq: Once | ORAL | Status: AC
  Administered 2022-05-03: 1 via ORAL
  Filled 2022-05-03: qty 1

## 2022-05-03 MED ORDER — METHOCARBAMOL 500 MG PO TABS: 500.0000 mg | ORAL_TABLET | Freq: Two times a day (BID) | ORAL | 0 refills | Status: DC

## 2022-05-03 MED ORDER — ONDANSETRON 8 MG PO TBDP
8.0000 mg | ORAL_TABLET | Freq: Once | ORAL | Status: AC
  Administered 2022-05-03: 8 mg via ORAL
  Filled 2022-05-03: qty 1
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
## 2022-05-03 NOTE — Discharge Instructions (Signed)
Your workup today was reassuring.  No concerning findings on the CT scan of the x-ray.  I have sent in a few medications into the pharmacy for you.  You will notice worsening muscle aches and soreness over the next couple days.  This is typical.  If any concerning symptoms return to the emergency room.

## 2022-05-04 ENCOUNTER — Encounter: Payer: Self-pay | Admitting: Nurse Practitioner

## 2022-05-04 ENCOUNTER — Ambulatory Visit (INDEPENDENT_AMBULATORY_CARE_PROVIDER_SITE_OTHER): Payer: BC Managed Care – PPO | Admitting: Nurse Practitioner

## 2022-05-04 VITALS — BP 104/72 | HR 86 | Temp 97.3°F | Ht 66.0 in | Wt 161.4 lb

## 2022-05-04 DIAGNOSIS — R0989 Other specified symptoms and signs involving the circulatory and respiratory systems: Secondary | ICD-10-CM

## 2022-05-04 MED ORDER — BACLOFEN 10 MG PO TABS: 10.0000 mg | ORAL_TABLET | Freq: Two times a day (BID) | ORAL | 1 refills | Status: DC
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
## 2022-05-04 NOTE — Patient Instructions (Signed)
Out of work until Wednesday  Take etodolac twice a day for pain- nonsteroidal  Take Baclofen twice a day for muscle relaxant  Apply lidoderm patch to left lower back for pain    Low Back Sprain or Strain Rehab Ask your health care provider which exercises are safe for you. Do exercises exactly as told by your health care provider and adjust them as directed. It is normal to feel mild stretching, pulling, tightness, or discomfort as you do these exercises. Stop right away if you feel sudden pain or your pain gets worse. Do not begin these exercises until told by your health care provider. Stretching and range-of-motion exercises These exercises warm up your muscles and joints and improve the movement and flexibility of your back. These exercises also help to relieve pain, numbness, and tingling. Lumbar rotation  Lie on your back on a firm bed or the floor with your knees bent. Straighten your arms out to your sides so each arm forms a 90-degree angle (right angle) with a side of your body. Slowly move (rotate) both of your knees to one side of your body until you feel a stretch in your lower back (lumbar). Try not to let your shoulders lift off the floor. Hold this position for __________ seconds. Tense your abdominal muscles and slowly move your knees back to the starting position. Repeat this exercise on the other side of your body. Repeat __________ times. Complete this exercise __________ times a day. Single knee to chest  Lie on your back on a firm bed or the floor with both legs straight. Bend one of your knees. Use your hands to move your knee up toward your chest until you feel a gentle stretch in your lower back and buttock. Hold your leg in this position by holding on to the front of your knee. Keep your other leg as straight as possible. Hold this position for __________ seconds. Slowly return to the starting position. Repeat with your other leg. Repeat __________ times.  Complete this exercise __________ times a day. Prone extension on elbows  Lie on your abdomen on a firm bed or the floor (prone position). Prop yourself up on your elbows. Use your arms to help lift your chest up until you feel a gentle stretch in your abdomen and your lower back. This will place some of your body weight on your elbows. If this is uncomfortable, try stacking pillows under your chest. Your hips should stay down, against the surface that you are lying on. Keep your hip and back muscles relaxed. Hold this position for __________ seconds. Slowly relax your upper body and return to the starting position. Repeat __________ times. Complete this exercise __________ times a day. Strengthening exercises These exercises build strength and endurance in your back. Endurance is the ability to use your muscles for a long time, even after they get tired. Pelvic tilt This exercise strengthens the muscles that lie deep in the abdomen. Lie on your back on a firm bed or the floor with your legs extended. Bend your knees so they are pointing toward the ceiling and your feet are flat on the floor. Tighten your lower abdominal muscles to press your lower back against the floor. This motion will tilt your pelvis so your tailbone points up toward the ceiling instead of pointing to your feet or the floor. To help with this exercise, you may place a small towel under your lower back and try to push your back into the towel.  Hold this position for __________ seconds. Let your muscles relax completely before you repeat this exercise. Repeat __________ times. Complete this exercise __________ times a day. Alternating arm and leg raises  Get on your hands and knees on a firm surface. If you are on a hard floor, you may want to use padding, such as an exercise mat, to cushion your knees. Line up your arms and legs. Your hands should be directly below your shoulders, and your knees should be directly below  your hips. Lift your left leg behind you. At the same time, raise your right arm and straighten it in front of you. Do not lift your leg higher than your hip. Do not lift your arm higher than your shoulder. Keep your abdominal and back muscles tight. Keep your hips facing the ground. Do not arch your back. Keep your balance carefully, and do not hold your breath. Hold this position for __________ seconds. Slowly return to the starting position. Repeat with your right leg and your left arm. Repeat __________ times. Complete this exercise __________ times a day. Abdominal set with straight leg raise  Lie on your back on a firm bed or the floor. Bend one of your knees and keep your other leg straight. Tense your abdominal muscles and lift your straight leg up, 4-6 inches (10-15 cm) off the ground. Keep your abdominal muscles tight and hold this position for __________ seconds. Do not hold your breath. Do not arch your back. Keep it flat against the ground. Keep your abdominal muscles tense as you slowly lower your leg back to the starting position. Repeat with your other leg. Repeat __________ times. Complete this exercise __________ times a day. Single leg lower with bent knees Lie on your back on a firm bed or the floor. Tense your abdominal muscles and lift your feet off the floor, one foot at a time, so your knees and hips are bent in 90-degree angles (right angles). Your knees should be over your hips and your lower legs should be parallel to the floor. Keeping your abdominal muscles tense and your knee bent, slowly lower one of your legs so your toe touches the ground. Lift your leg back up to return to the starting position. Do not hold your breath. Do not let your back arch. Keep your back flat against the ground. Repeat with your other leg. Repeat __________ times. Complete this exercise __________ times a day. Posture and body mechanics Good posture and healthy body mechanics  can help to relieve stress in your body's tissues and joints. Body mechanics refers to the movements and positions of your body while you do your daily activities. Posture is part of body mechanics. Good posture means: Your spine is in its natural S-curve position (neutral). Your shoulders are pulled back slightly. Your head is not tipped forward (neutral). Follow these guidelines to improve your posture and body mechanics in your everyday activities. Standing  When standing, keep your spine neutral and your feet about hip-width apart. Keep a slight bend in your knees. Your ears, shoulders, and hips should line up. When you do a task in which you stand in one place for a long time, place one foot up on a stable object that is 2-4 inches (5-10 cm) high, such as a footstool. This helps keep your spine neutral. Sitting  When sitting, keep your spine neutral and keep your feet flat on the floor. Use a footrest, if necessary, and keep your thighs parallel to the  floor. Avoid rounding your shoulders, and avoid tilting your head forward. When working at a desk or a computer, keep your desk at a height where your hands are slightly lower than your elbows. Slide your chair under your desk so you are close enough to maintain good posture. When working at a computer, place your monitor at a height where you are looking straight ahead and you do not have to tilt your head forward or downward to look at the screen. Resting When lying down and resting, avoid positions that are most painful for you. If you have pain with activities such as sitting, bending, stooping, or squatting, lie in a position in which your body does not bend very much. For example, avoid curling up on your side with your arms and knees near your chest (fetal position). If you have pain with activities such as standing for a long time or reaching with your arms, lie with your spine in a neutral position and bend your knees slightly. Try the  following positions: Lying on your side with a pillow between your knees. Lying on your back with a pillow under your knees. Lifting  When lifting objects, keep your feet at least shoulder-width apart and tighten your abdominal muscles. Bend your knees and hips and keep your spine neutral. It is important to lift using the strength of your legs, not your back. Do not lock your knees straight out. Always ask for help to lift heavy or awkward objects. This information is not intended to replace advice given to you by your health care provider. Make sure you discuss any questions you have with your health care provider. Document Revised: 03/08/2020 Document Reviewed: 03/08/2020 Elsevier Patient Education  2023 [employer].

## 2022-05-14 ENCOUNTER — Other Ambulatory Visit: Payer: Self-pay | Admitting: Nurse Practitioner

## 2022-05-14 DIAGNOSIS — K219 Gastro-esophageal reflux disease without esophagitis: Secondary | ICD-10-CM

## 2022-05-18 IMAGING — DX DG KNEE 1-2V PORT*R*
1 series · 4 of 4 positions shown · non-contrast
Comparison: 12/25/2017

CLINICAL DATA: Postop total right knee arthroplasty.

EXAM:
PORTABLE RIGHT KNEE - 1-2 VIEW

[Series 1: knee · 0.14mm/px · 4 of 4 slices shown]
[im 1/4]
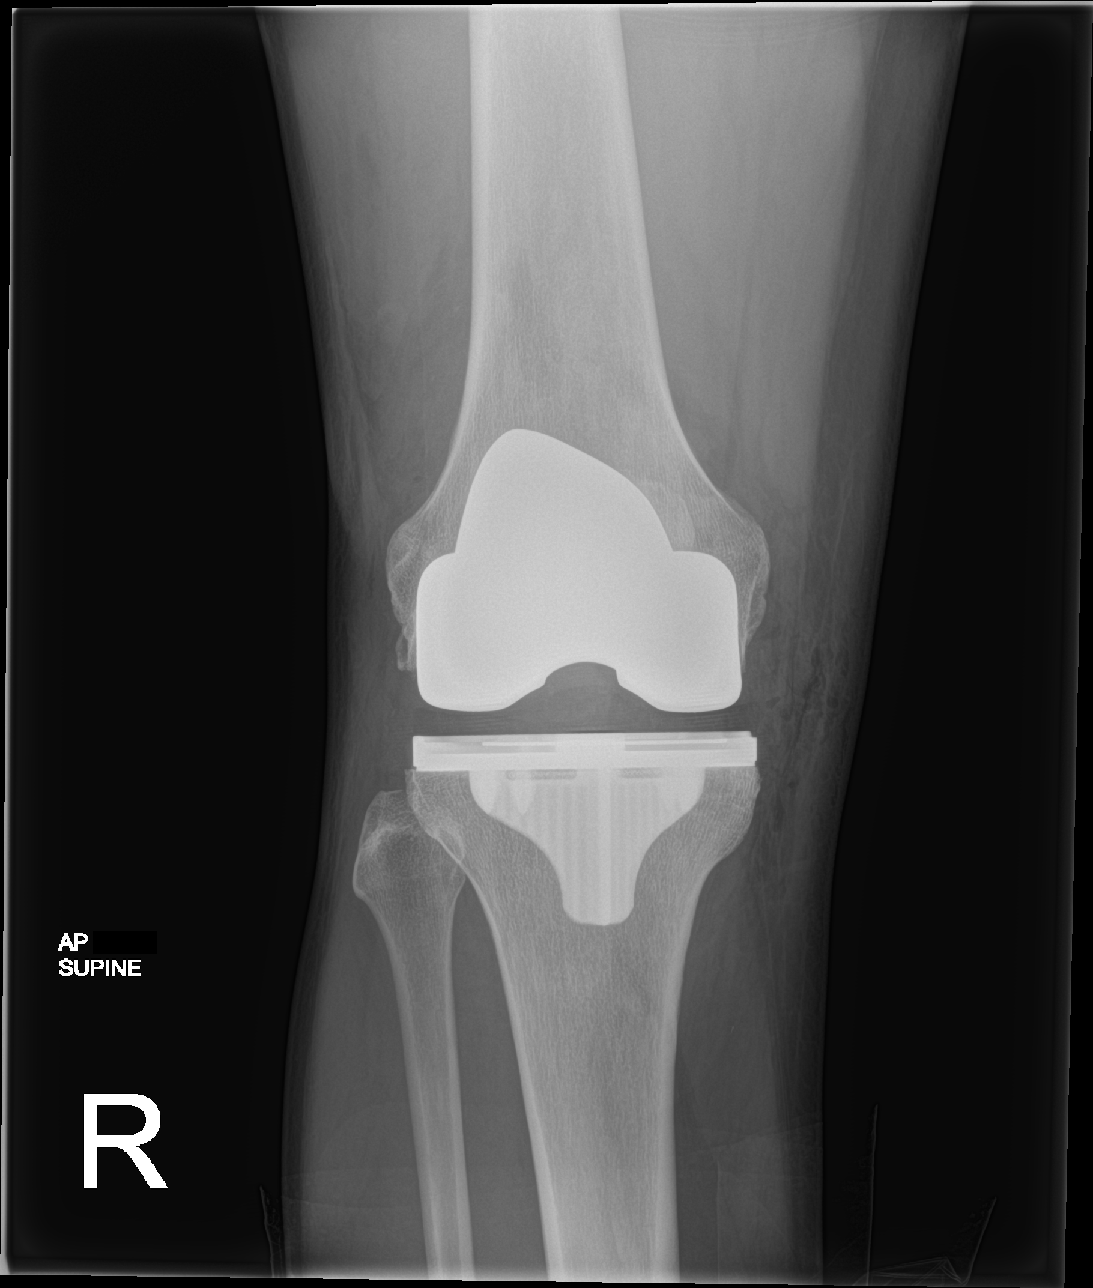
[im 2/4]
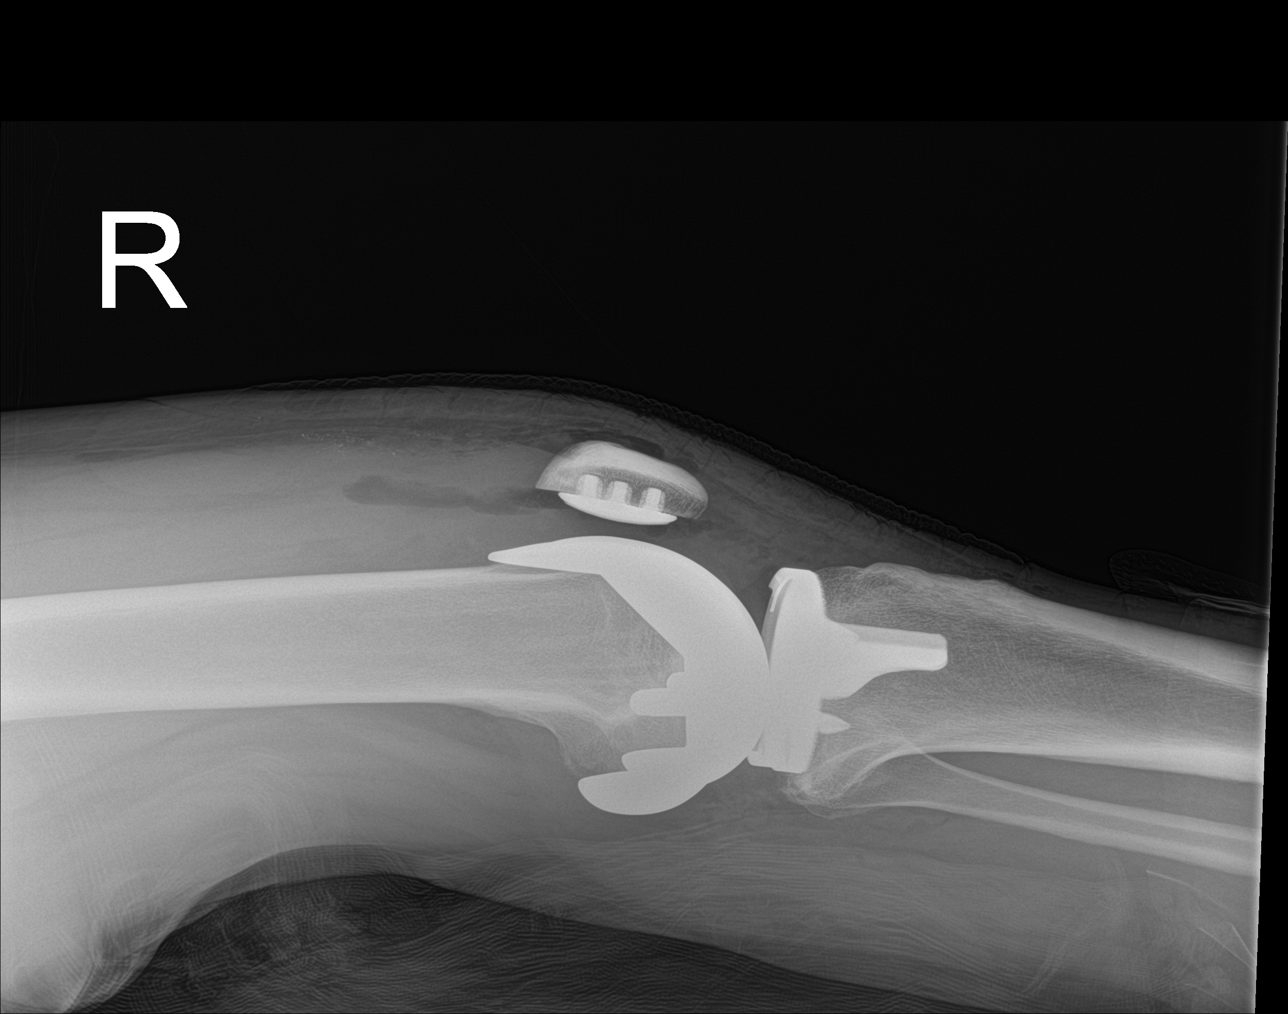
[im 3/4]
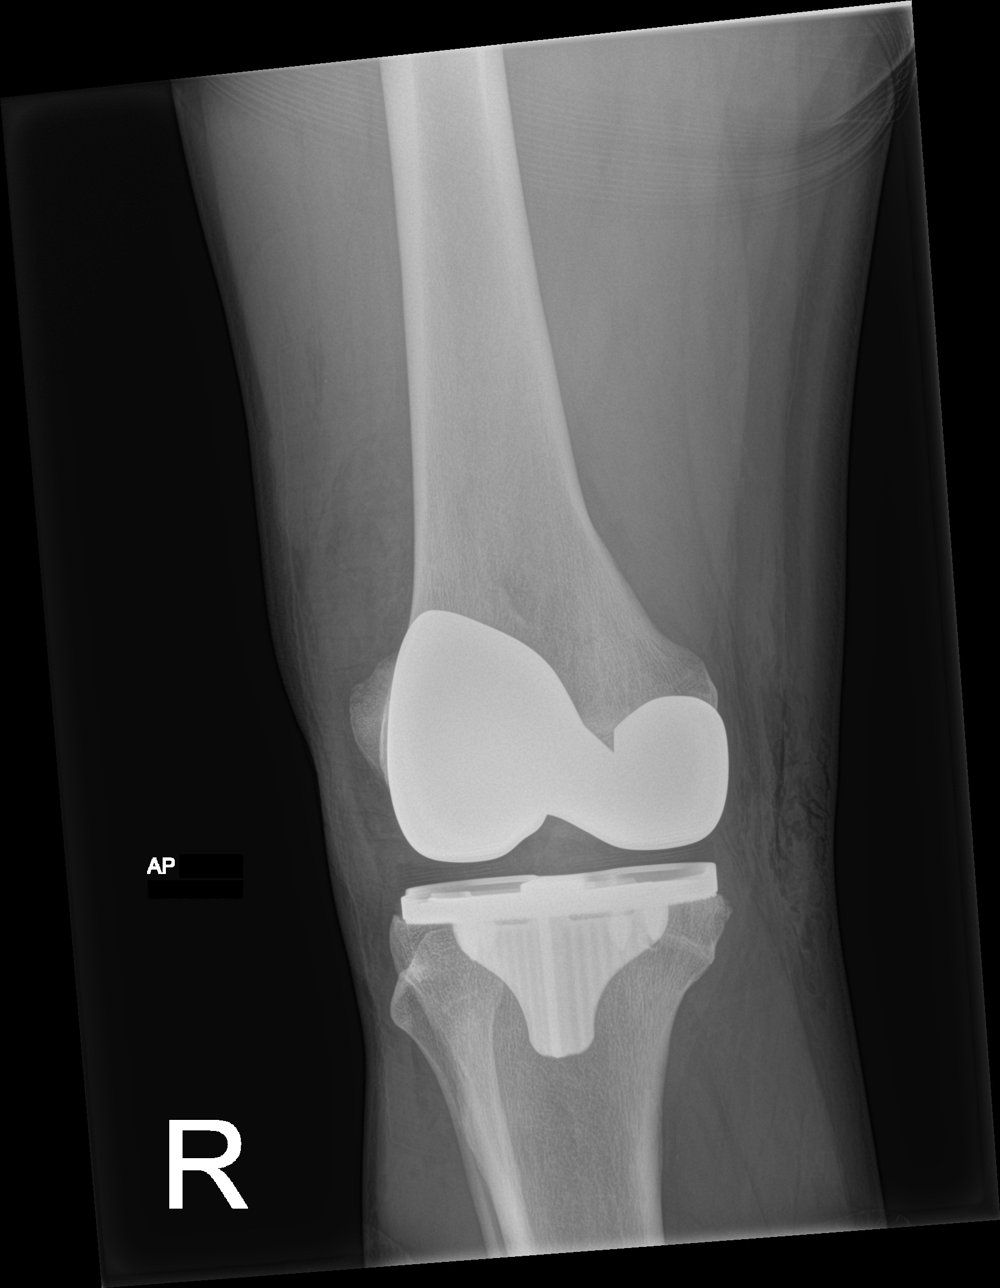
[im 4/4]
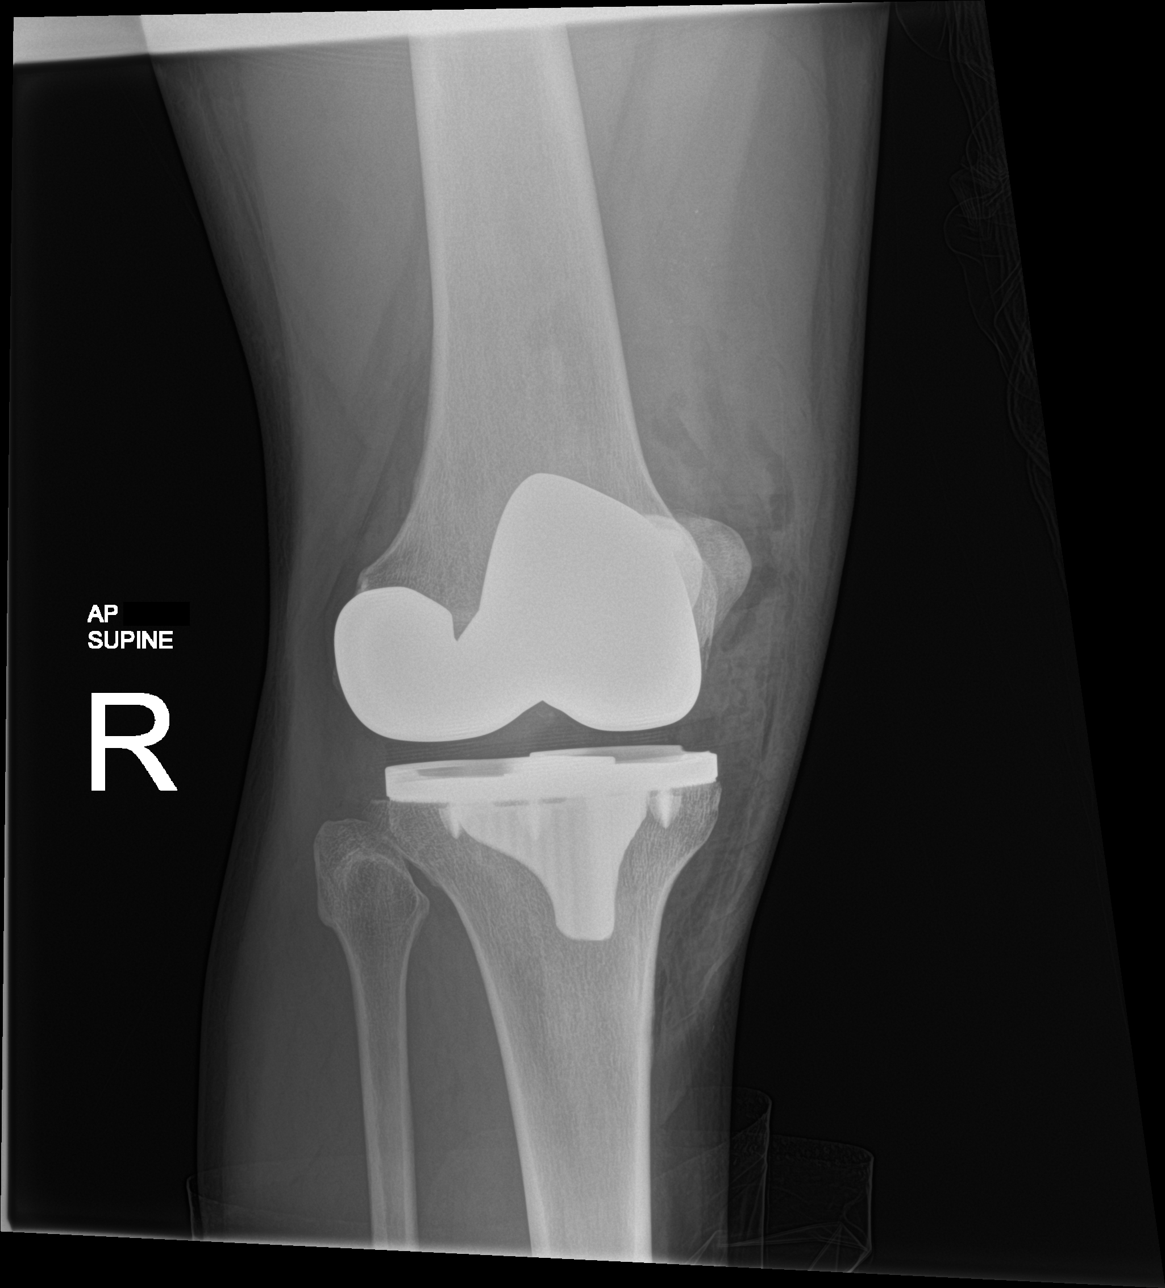

[4 of 4 positions shown; findings below may reference images not displayed]

FINDINGS: Well seated components of a total knee arthroplasty. No complicating
features are identified.
IMPRESSION: Well seated components of a total knee arthroplasty.

## 2022-07-18 ENCOUNTER — Encounter: Payer: Self-pay | Admitting: Internal Medicine

## 2022-07-18 ENCOUNTER — Ambulatory Visit (INDEPENDENT_AMBULATORY_CARE_PROVIDER_SITE_OTHER): Payer: BC Managed Care – PPO | Admitting: Internal Medicine

## 2022-07-18 VITALS — BP 120/82 | HR 71 | Temp 97.9°F | Resp 16 | Ht 66.0 in | Wt 164.6 lb

## 2022-07-18 DIAGNOSIS — I700 Atherosclerosis of aorta: Secondary | ICD-10-CM | POA: Diagnosis not present

## 2022-07-18 DIAGNOSIS — Z79899 Other long term (current) drug therapy: Secondary | ICD-10-CM | POA: Diagnosis not present

## 2022-07-18 DIAGNOSIS — Z136 Encounter for screening for cardiovascular disorders: Secondary | ICD-10-CM | POA: Diagnosis not present

## 2022-07-18 DIAGNOSIS — Z13220 Encounter for screening for lipoid disorders: Secondary | ICD-10-CM

## 2022-07-18 DIAGNOSIS — Z131 Encounter for screening for diabetes mellitus: Secondary | ICD-10-CM

## 2022-07-18 DIAGNOSIS — K219 Gastro-esophageal reflux disease without esophagitis: Secondary | ICD-10-CM

## 2022-07-18 DIAGNOSIS — R0989 Other specified symptoms and signs involving the circulatory and respiratory systems: Secondary | ICD-10-CM | POA: Diagnosis not present

## 2022-07-18 DIAGNOSIS — Z0000 Encounter for general adult medical examination without abnormal findings: Secondary | ICD-10-CM

## 2022-07-18 DIAGNOSIS — R5383 Other fatigue: Secondary | ICD-10-CM

## 2022-07-18 DIAGNOSIS — Z1389 Encounter for screening for other disorder: Secondary | ICD-10-CM

## 2022-07-18 DIAGNOSIS — Z111 Encounter for screening for respiratory tuberculosis: Secondary | ICD-10-CM

## 2022-07-18 DIAGNOSIS — Z125 Encounter for screening for malignant neoplasm of prostate: Secondary | ICD-10-CM | POA: Diagnosis not present

## 2022-07-18 DIAGNOSIS — R350 Frequency of micturition: Secondary | ICD-10-CM | POA: Diagnosis not present

## 2022-07-18 DIAGNOSIS — E559 Vitamin D deficiency, unspecified: Secondary | ICD-10-CM | POA: Diagnosis not present

## 2022-07-18 DIAGNOSIS — Z0001 Encounter for general adult medical examination with abnormal findings: Secondary | ICD-10-CM

## 2022-07-18 DIAGNOSIS — Z1329 Encounter for screening for other suspected endocrine disorder: Secondary | ICD-10-CM

## 2022-07-18 DIAGNOSIS — E782 Mixed hyperlipidemia: Secondary | ICD-10-CM

## 2022-07-18 DIAGNOSIS — Z8249 Family history of ischemic heart disease and other diseases of the circulatory system: Secondary | ICD-10-CM

## 2022-07-18 DIAGNOSIS — Z87891 Personal history of nicotine dependence: Secondary | ICD-10-CM

## 2022-07-18 DIAGNOSIS — Z130 Encounter for screening for diseases of the blood and blood-forming organs and certain disorders involving the immune mechanism: Secondary | ICD-10-CM

## 2022-07-18 DIAGNOSIS — Z1211 Encounter for screening for malignant neoplasm of colon: Secondary | ICD-10-CM

## 2022-07-18 DIAGNOSIS — N401 Enlarged prostate with lower urinary tract symptoms: Secondary | ICD-10-CM

## 2022-07-18 DIAGNOSIS — R7309 Other abnormal glucose: Secondary | ICD-10-CM

## 2022-07-18 LAB — CBC WITH DIFFERENTIAL/PLATELET
Basophils Absolute: 37 cells/uL (ref 0–200)
Neutrophils Relative %: 51.6 %
Total Lymphocyte: 37.5 %

## 2022-07-18 NOTE — Patient Instructions (Signed)

## 2022-07-18 NOTE — Progress Notes (Unsigned)
Annual  Screening/Preventative Visit  & Comprehensive Evaluation & Examination  Future Appointments  Date Time Provider Department  07/18/2022                       cpe  3:00 PM Lucky Cowboy, MD [facility]  07/26/2023                       cpe  3:00 PM Lucky Cowboy, MD [facility]            This very nice 62 y.o. MBM with HTN, HLD,  Prediabetes and Vitamin D Deficiency   presents for a Screening /Preventative Visit & comprehensive evaluation and management of multiple medical co-morbidities.  Patient has GERD controlled on his meds.        Patient has been followed expectantly for labile HTN.  Patient's BP has been controlled at home.  Today's BP is at goal  -                              . Patient denies any cardiac symptoms as chest pain, palpitations, shortness of breath, dizziness or ankle swelling.  BP Readings from Last 3 Encounters:  07/18/22 120/82  05/04/22 104/72  05/03/22 (!) 152/96        Patient's hyperlipidemia is  controlled with diet.  Last lipids were at goal:  Lab Results  Component Value Date   CHOL 149 01/24/2022   HDL 66 01/24/2022   LDLCALC 70 01/24/2022   TRIG 47 01/24/2022   CHOLHDL 2.3 01/24/2022         Patient has hx/o prediabetes ( A1c 5.7%  /2015 and A1c 6.2%  /Jan 2021) and patient denies reactive hypoglycemic symptoms, visual blurring, diabetic polys or paresthesias. Last A1c was near goal:   Lab Results  Component Value Date   HGBA1C 6.1 (H) 01/24/2022         Finally, patient has history of Vitamin D Deficiency ("32"/ 2015)  and last vitamin D was still very low:   Lab Results  Component Value Date   VD25OH 26 (L) 01/24/2022       Current Outpatient Medications  Medication Instructions   baclofen  10 mg Take 1/2 to 1 tablet 2 x day if needed   etodolac  400 mg 2 times daily   gabapentin 600 MG tablet Take one tablet 4 x daily as needed for pain   ibuprofen  600 mg   3 times daily PRN   LIDODERM  5 % 1 patch,  Transdermal, Every 24 hours   omeprazole   40 mg Daily   sildenafil (VIAGRA)  100 mg  As needed   Vitamin D   10,000 Units    Daily     Allergies  Allergen Reactions   Benzodiazepines Hives   Chantix  [Varenicline] Hives   Cialis [Tadalafil]     Hives   Dicyclomine Hives   Meloxicam     Rash   Penicillins Hives    TOLERATED ANCEF 08/04/2019.   Relafen [Nabumetone]     Hives     Past Medical History:  Diagnosis Date   Allergy    albuterol as needed   Anal fissure    Arthritis    Colon polyps 01/13/11   Colonoscopy   GERD (gastroesophageal reflux disease)    Pneumonia    Pre-diabetes      Health Maintenance  Topic Date Due   COVID-19 Vaccine (5) 07/09/2020   Pneumococcal Vaccine 23 10/12/2020 (Originally 10/14/2010)   Zoster Vaccines- Shingrix (1 of 2) 10/12/2020 (Originally [birth year]/1979)   INFLUENZA VACCINE  08/02/2020   TETANUS/TDAP  01/02/2021   Hepatitis C Screening  Completed   HIV Screening  Completed   HPV VACCINES  Aged Out     Immunization History  Administered Date(s) Administered   Influenza Inj Mdck Quad  10/18/2017   Influenza Split 10/13/2019   Influenza Whole 10/31/2006, 11/03/2010, 11/02/2011   Influenza,inj,Quad  09/12/2018   Influenza 01/03/2012   PFIZER SARS-COV-2 Vacc 03/25/2019, 04/15/2019, 11/25/2019, 03/09/2020   PPD Test 03/17/2013   Pneumococcal-23 10/13/2009   Td 01/03/2008   Tdap 01/03/2011    Last Colon - 01/13/2011 -   Dr Charlott Rakes - 10 years   Past Surgical History:  Procedure Laterality Date   ANAL FISSURE REPAIR  02/24/11   CHONDROPLASTY  08/14/2018   Procedure: CHONDROPLASTY;  Surgeon: Tarry Kos, MD;  Location: [facility];  Service: Orthopedics;;   COLONOSCOPY     KNEE ARTHROSCOPY WITH MEDIAL MENISECTOMY Left 08/14/2018   Procedure: LEFT KNEE ARTHROSCOPY WITH PARTIAL MEDIAL MENISCECTOMY;  Surgeon: Tarry Kos, MD;  Location: [facility];  Service: Orthopedics;  Laterality: Left;    KNEE ARTHROSCOPY WITH MEDIAL MENISECTOMY Right 10/18/2018   Procedure: RIGHT KNEE ARTHROSCOPY WITH PARTIAL MEDIAL MENISCECTOMY;  Surgeon: Tarry Kos, MD;  Location: [facility];  Service: Orthopedics;  Laterality: Right;   TOTAL KNEE ARTHROPLASTY Left 01/24/2019   Procedure: LEFT TOTAL KNEE ARTHROPLASTY;  Surgeon: Tarry Kos, MD;  Location: [facility];  Service: Orthopedics;  Laterality: Left;   TOTAL KNEE ARTHROPLASTY Right 08/04/2019   Procedure: RIGHT TOTAL KNEE ARTHROPLASTY;  Surgeon: Tarry Kos, MD;  Location: [facility];  Service: Orthopedics;  Laterality: Right;     Family History  Problem Relation Age of Onset   Rheum arthritis Mother    Heart disease Mother    Diabetes Mother    Arthritis Mother    Diabetes Father    Blindness Father        from DM?   Allergies Daughter    Allergies Sister    Asthma Daughter     Social History   Socioeconomic History   Marital status: Married    Spouse name: Not on file   Number of children: 2  Occupational History    Employer: [employer]  Tobacco Use   Smoking status: Former    Packs/day: 0.25    Years: 27.00    Pack years: 6.75    Types: Cigarettes    Quit date: 08/20/2018    Years since quitting: 1.8   Smokeless tobacco: Never  Vaping Use   Vaping Use: Never used  Substance and Sexual Activity   Alcohol use: Yes    Comment: socially   Drug use: No   Sexual activity: Not on file  Other Topics Concern    ROS Constitutional: Denies fever, chills, weight loss/gain, headaches, insomnia,  night sweats or change in appetite. Does c/o fatigue. Eyes: Denies redness, blurred vision, diplopia, discharge, itchy or watery eyes.  ENT: Denies discharge, congestion, post nasal drip, epistaxis, sore throat, earache, hearing loss, dental pain, Tinnitus, Vertigo, Sinus pain or snoring.  Cardio: Denies chest pain, palpitations, irregular heartbeat, syncope, dyspnea, diaphoresis, orthopnea, PND, claudication or edema Respiratory:  denies cough, dyspnea, DOE, pleurisy, hoarseness, laryngitis or wheezing.  Gastrointestinal: Denies dysphagia, heartburn, reflux, water brash,  pain, cramps, nausea, vomiting, bloating, diarrhea, constipation, hematemesis, melena, hematochezia, jaundice or hemorrhoids Genitourinary: Denies dysuria, frequency, urgency, nocturia, hesitancy, discharge, hematuria or flank pain Musculoskeletal: Denies arthralgia, myalgia, stiffness, Jt. Swelling, pain, limp or strain/sprain. Denies Falls. Skin: Denies puritis, rash, hives, warts, acne, eczema or change in skin lesion Neuro: No weakness, tremor, incoordination, spasms, paresthesia or pain Psychiatric: Denies confusion, memory loss or sensory loss. Denies Depression. Endocrine: Denies change in weight, skin, hair change, nocturia, and paresthesia, diabetic polys, visual blurring or hyper / hypo glycemic episodes.  Heme/Lymph: No excessive bleeding, bruising or enlarged lymph nodes.   Physical Exam  BP 120/82   Pulse 71   Temp 97.9 F (36.6 C)   Resp 16   Ht 5\' 6"  (1.676 m)   Wt 164 lb 9.6 oz (74.7 kg)   SpO2 99%   BMI 26.57 kg/m   General Appearance: Well nourished and well groomed and in no apparent distress.  Eyes: PERRLA, EOMs, conjunctiva no swelling or erythema, normal fundi and vessels. Sinuses: No frontal/maxillary tenderness ENT/Mouth: EACs patent / TMs  nl. Nares clear without erythema, swelling, mucoid exudates. Oral hygiene is good. No erythema, swelling, or exudate. Tongue normal, non-obstructing. Tonsils not swollen or erythematous. Hearing normal.  Neck: Supple, thyroid not palpable. No bruits, nodes or JVD. Respiratory: Respiratory effort normal.  BS equal and clear bilateral without rales, rhonci, wheezing or stridor. Cardio: Heart sounds are normal with regular rate and rhythm and no murmurs, rubs or gallops. Peripheral pulses are normal and equal bilaterally without edema. No aortic or femoral bruits. Chest: symmetric with  normal excursions and percussion.  Abdomen: Soft, with Nl bowel sounds. Nontender, no guarding, rebound, hernias, masses, or organomegaly.  Lymphatics: Non tender without lymphadenopathy.  Musculoskeletal: Full ROM all peripheral extremities, joint stability, 5/5 strength, and normal gait. Skin: Warm and dry without rashes, lesions, cyanosis, clubbing or  ecchymosis.  Neuro: Cranial nerves intact, reflexes equal bilaterally. Normal muscle tone, no cerebellar symptoms. Sensation intact.  Pysch: Alert and oriented X 3 with normal affect, insight and judgment appropriate.   Assessment and Plan  1. Annual Preventative/Screening Exam    2. Labile hypertension  - EKG 12-Lead - [place], RETROPERITNL ABD,  LTD - Urinalysis, Routine w reflex microscopic - Microalbumin / creatinine urine ratio - CBC with Differential/Platelet - COMPLETE METABOLIC PANEL WITH GFR - Magnesium - TSH  3. Hyperlipidemia, mixed  - EKG 12-Lead - [place], RETROPERITNL ABD,  LTD - Lipid panel - TSH  4. Abnormal glucose  - EKG 12-Lead - [place], RETROPERITNL ABD,  LTD - Hemoglobin A1c - Insulin, random  5. Vitamin D deficiency  - VITAMIN D 25 Hydroxy   6. Gastroesophageal reflux disease  - CBC with Differential/Platelet  7. Screening for colorectal cancer  - POC Hemoccult Bld/Stl   8. Prostate cancer screening  - PSA  9. Screening-pulmonary TB  - TB Skin Test  10. Screening for heart disease  - EKG 12-Lead  11. FHx: heart disease  - EKG 12-Lead - [place], RETROPERITNL ABD,  LTD  12. Former smoker  - EKG 12-Lead - [place], RETROPERITNL ABD,  LTD  13. Screening for AAA (aortic abdominal aneurysm)  - [place], RETROPERITNL ABD,  LTD  14. Fatigue, unspecified type  - Iron, Total/Total Iron Binding Cap - Vitamin B12 - Testosterone - CBC with Differential/Platelet - TSH  15. Medication management  - Urinalysis, Routine w reflex microscopic - Microalbumin / creatinine urine ratio - CBC with  Differential/Platelet - COMPLETE METABOLIC PANEL WITH  GFR - Magnesium - Lipid panel - TSH - Hemoglobin A1c - Insulin, random - VITAMIN D 25 Hydroxy            Patient was counseled in prudent diet, weight control to achieve/maintain BMI less than 25, BP monitoring, regular exercise and medications as discussed.  Discussed med effects and SE's. Routine screening labs and tests as requested with regular follow-up as recommended. Over 40 minutes of exam, counseling, chart review and high complex critical decision making was performed   Marinus Maw, MD

## 2022-07-19 LAB — COMPLETE METABOLIC PANEL WITH GFR
AG Ratio: 1.6 (calc) (ref 1.0–2.5)
ALT: 15 U/L (ref 9–46)
AST: 16 U/L (ref 10–35)
Albumin: 4.2 g/dL (ref 3.6–5.1)
Alkaline phosphatase (APISO): 97 U/L (ref 35–144)
BUN: 11 mg/dL (ref 7–25)
CO2: 25 mmol/L (ref 20–32)
Calcium: 9.3 mg/dL (ref 8.6–10.3)
Chloride: 107 mmol/L (ref 98–110)
Creat: 1.1 mg/dL (ref 0.70–1.35)
Globulin: 2.6 g/dL (calc) (ref 1.9–3.7)
Glucose, Bld: 96 mg/dL (ref 65–99)
Potassium: 4.2 mmol/L (ref 3.5–5.3)
Sodium: 140 mmol/L (ref 135–146)
Total Bilirubin: 0.2 mg/dL (ref 0.2–1.2)
Total Protein: 6.8 g/dL (ref 6.1–8.1)
eGFR: 76 mL/min/{1.73_m2} (ref >=60)

## 2022-07-19 LAB — CBC WITH DIFFERENTIAL/PLATELET
Absolute Monocytes: 488 cells/uL (ref 200–950)
Basophils Relative: 0.5 %
Eosinophils Absolute: 281 cells/uL (ref 15–500)
Eosinophils Relative: 3.8 %
HCT: 41.3 % (ref 38.5–50.0)
Hemoglobin: 13.1 g/dL — ABNORMAL LOW (ref 13.2–17.1)
Lymphs Abs: 2775 cells/uL (ref 850–3900)
MCH: 26.7 pg — ABNORMAL LOW (ref 27.0–33.0)
MCHC: 31.7 g/dL — ABNORMAL LOW (ref 32.0–36.0)
MCV: 84.3 fL (ref 80.0–100.0)
MPV: 10.3 fL (ref 7.5–12.5)
Monocytes Relative: 6.6 %
Neutro Abs: 3818 cells/uL (ref 1500–7800)
Platelets: 254 10*3/uL (ref 140–400)
RBC: 4.9 10*6/uL (ref 4.20–5.80)
RDW: 12.6 % (ref 11.0–15.0)
WBC: 7.4 10*3/uL (ref 3.8–10.8)

## 2022-07-19 LAB — PSA: PSA: 0.85 ng/mL (ref <=4.00)

## 2022-07-19 LAB — URINALYSIS, ROUTINE W REFLEX MICROSCOPIC
Bilirubin Urine: NEGATIVE
Glucose, UA: NEGATIVE
Hgb urine dipstick: NEGATIVE
Ketones, ur: NEGATIVE
Leukocytes,Ua: NEGATIVE
Nitrite: NEGATIVE
Protein, ur: NEGATIVE
Specific Gravity, Urine: 1.017 (ref 1.001–1.035)
pH: 5.5 (ref 5.0–8.0)

## 2022-07-19 LAB — LIPID PANEL
Cholesterol: 156 mg/dL (ref <200)
HDL: 57 mg/dL (ref >=40)
LDL Cholesterol (Calc): 83 mg/dL (calc)
Non-HDL Cholesterol (Calc): 99 mg/dL (calc) (ref <130)
Total CHOL/HDL Ratio: 2.7 (calc) (ref <5.0)
Triglycerides: 82 mg/dL (ref <150)

## 2022-07-19 LAB — MICROALBUMIN / CREATININE URINE RATIO
Creatinine, Urine: 135 mg/dL (ref 20–320)
Microalb Creat Ratio: 2 mg/g creat (ref <30)
Microalb, Ur: 0.3 mg/dL

## 2022-07-19 LAB — IRON, TOTAL/TOTAL IRON BINDING CAP
%SAT: 19 % (calc) — ABNORMAL LOW (ref 20–48)
Iron: 58 ug/dL (ref 50–180)
TIBC: 309 mcg/dL (calc) (ref 250–425)

## 2022-07-19 LAB — VITAMIN D 25 HYDROXY (VIT D DEFICIENCY, FRACTURES): Vit D, 25-Hydroxy: 37 ng/mL (ref 30–100)

## 2022-07-19 LAB — TESTOSTERONE: Testosterone: 170 ng/dL — ABNORMAL LOW (ref 250–827)

## 2022-07-19 LAB — MAGNESIUM: Magnesium: 2 mg/dL (ref 1.5–2.5)

## 2022-07-19 LAB — INSULIN, RANDOM: Insulin: 22.6 u[IU]/mL — ABNORMAL HIGH

## 2022-07-19 LAB — TSH: TSH: 0.86 mIU/L (ref 0.40–4.50)

## 2022-07-19 LAB — HEMOGLOBIN A1C W/OUT EAG: Hgb A1c MFr Bld: 6.1 % of total Hgb — ABNORMAL HIGH (ref <5.7)

## 2022-07-19 LAB — VITAMIN B12: Vitamin B-12: 440 pg/mL (ref 200–1100)

## 2022-07-19 NOTE — Progress Notes (Signed)
^<^<^<^<^<^<^<^<^<^<^<^<^<^<^<^<^<^<^<^<^<^<^<^<^<^<^<^<^<^<^<^<^<^<^<^<^ ^>^>^>^>^>^>^>^>^>^>^>>^>^>^>^>^>^>^>^>^>^>^>^>^>^>^>^>^>^>^>^>^>^>^>^>^>  -  Test results slightly outside the reference range are not unusual. If there is anything important, I will review this with you,  otherwise it is considered normal test values.  If you have further questions,  please do not hesitate to contact me at the office or via My Chart.   ^<^<^<^<^<^<^<^<^<^<^<^<^<^<^<^<^<^<^<^<^<^<^<^<^<^<^<^<^<^<^<^<^<^<^<^<^ ^>^>^>^>^>^>^>^>^>^>^>^>^>^>^>^>^>^>^>^>^>^>^>^>^>^>^>^>^>^>^>^>^>^>^>^>^  - Testosterone is a little low - So be sure & take Zinc 50 mg tablet Daily  to help raise Testosterone level  -  Daily  Exercise also helps raise Testosterone levels. ^<^<^<^<^<^<^<^<^<^<^<^<^<^<^<^<^<^<^<^<^<^<^<^<^<^<^<^<^<^<^<^<^<^<^<^<^  - Iron levels - are OK   ^>^>^>^>^>^>^>^>^>^>^>^>^>^>^>^>^>^>^>^>^>^>^>^>^>^>^>^>^>^>^>^>^>^>^>^>^  - A1c = 6.1% ( Normal is less than  5.7 %)   - The A1c is a  12 week average blood sugar &   yours is elevated ( 6.1%)   in the pre or early diabetic range,   So you need to work harder on diet &    - Avoid Sweets, Candy & White Stuff   - White Rice, White Potatoes, White Flour  - Breads &  Pasta ^<^<^<^<^<^<^<^<^<^<^<^<^<^<^<^<^<^<^<^<^<^<^<^<^<^<^<^<^<^<^<^<^<^<^<^<^ ^>^>^>^>^>^>^>^>^>^>^>^>^>^>^>^>^>^>^>^>^>^>^>^>^>^>^>^>^>^>^>^>^>^>^>^>^  -   Chol = 156 is    Excellent   - Very low risk for Heart Attack  / Stroke  ^>^>^>^>^>^>^>^>^>^>^>^>^>^>^>^>^>^>^>^>^>^>^>^>^>^>^>^>^>^>^>^>^>^>^>^>^ ^>^>^>^>^>^>^>^>^>^>^>^>^>^>^>^>^>^>^>^>^>^>^>^>^>^>^>^>^>^>^>^>^>^>^>^>^  -  Vitamin D = 37 is dangerously LOW   - Vitamin D goal is between 70-100.   - Please make sure that you are taking your Vitamin D   10,000 units  as directed.   - It is very important as a natural anti-inflammatory and helping the                           immune system protect against viral infections,  like the Covid-19    helping hair, skin, and nails, as well as reducing stroke and heart attack risk.   - It helps your bones and helps with mood.  - It also decreases numerous cancer risks so please                                                                                           take it as directed.   - Low Vit D is associated with a 200-300% higher risk for CANCER   and 200-300% higher risk for HEART   ATTACK  &  STROKE.    - It is also associated with higher death rate at younger ages,   autoimmune diseases like Rheumatoid arthritis, Lupus, Multiple Sclerosis.     - Also many other serious conditions, like depression, Alzheimer's  Dementia,  muscle aches, fatigue, fibromyalgia   ^>^>^>^>^>^>^>^>^>^>^>^>^>^>^>^>^>^>^>^>^>^>^>^>^>^>^>^>^>^>^>^>^>^>^>^>^  All Else - CBC - Kidneys - Electrolytes - Liver - Magnesium & Thyroid    - all  Normal / OK ^<^<^<^<^<^<^<^<^<^<^<^<^<^<^<^<^<^<^<^<^<^<^<^<^<^<^<^<^<^<^<^<^<^<^<^<^ ^>^>^>^>^>^>^>^>^>^>^>^>^>^>^>^>^>^>^>^>^>^>^>^>^>^>^>^>^>^>^>^>^>^>^>^>^

## 2022-08-21 ENCOUNTER — Other Ambulatory Visit: Payer: Self-pay | Admitting: Nurse Practitioner

## 2022-09-03 ENCOUNTER — Other Ambulatory Visit: Payer: Self-pay | Admitting: Nurse Practitioner

## 2022-09-18 ENCOUNTER — Telehealth: Payer: Self-pay | Admitting: Orthopaedic Surgery

## 2022-09-18 NOTE — Telephone Encounter (Signed)
IC,lmvm for patient advising CD and notes are ready for him to pick up.

## 2022-09-18 NOTE — Telephone Encounter (Signed)
Please copy 07/26/2021 xrays to CD. Please let me know when ready. I have auth. Thanks

## 2022-09-19 ENCOUNTER — Telehealth: Payer: Self-pay | Admitting: Nurse Practitioner

## 2022-09-19 NOTE — Telephone Encounter (Signed)
Patient says that you wrote him a letter to give to his employer stating that he is unable to wear ear plugs. He has misplaced the letter and wants to know if you can print him a copy for him to come by and pick up this afternoon?

## 2022-10-23 ENCOUNTER — Ambulatory Visit: Payer: BC Managed Care – PPO | Admitting: Nurse Practitioner

## 2022-12-31 ENCOUNTER — Other Ambulatory Visit: Payer: Self-pay | Admitting: Nurse Practitioner

## 2022-12-31 ENCOUNTER — Encounter: Payer: Self-pay | Admitting: Nurse Practitioner

## 2022-12-31 DIAGNOSIS — F524 Premature ejaculation: Secondary | ICD-10-CM

## 2023-01-01 MED ORDER — SILDENAFIL CITRATE 100 MG PO TABS: 100.0000 mg | ORAL_TABLET | ORAL | 1 refills | Status: AC | PRN

## 2023-01-01 MED ORDER — IBUPROFEN 600 MG PO TABS: 600.0000 mg | ORAL_TABLET | Freq: Three times a day (TID) | ORAL | 2 refills | Status: AC | PRN

## 2023-01-04 ENCOUNTER — Other Ambulatory Visit: Payer: Self-pay | Admitting: Nurse Practitioner

## 2023-01-30 ENCOUNTER — Ambulatory Visit: Payer: BC Managed Care – PPO | Admitting: Internal Medicine

## 2023-04-18 ENCOUNTER — Other Ambulatory Visit: Payer: Self-pay | Admitting: Nurse Practitioner

## 2023-04-18 DIAGNOSIS — F524 Premature ejaculation: Secondary | ICD-10-CM

## 2023-05-02 ENCOUNTER — Ambulatory Visit: Payer: BC Managed Care – PPO | Admitting: Nurse Practitioner

## 2023-07-26 ENCOUNTER — Encounter: Payer: BC Managed Care – PPO | Admitting: Internal Medicine

## 2024-01-22 ENCOUNTER — Ambulatory Visit: Payer: Self-pay | Admitting: Physician Assistant
